# Patient Record
Sex: Male | Born: 1935 | Race: White | Hispanic: No | Marital: Married | Smoking: Never smoker
Health system: Southern US, Community
[De-identification: ages and names within clinical notes are randomized; demographics above are authoritative.]

## PROBLEM LIST (undated history)

## (undated) DIAGNOSIS — N135 Crossing vessel and stricture of ureter without hydronephrosis: Secondary | ICD-10-CM

## (undated) DIAGNOSIS — H3530 Unspecified macular degeneration: Secondary | ICD-10-CM

## (undated) DIAGNOSIS — I639 Cerebral infarction, unspecified: Secondary | ICD-10-CM

## (undated) DIAGNOSIS — J45909 Unspecified asthma, uncomplicated: Secondary | ICD-10-CM

## (undated) DIAGNOSIS — K635 Polyp of colon: Secondary | ICD-10-CM

## (undated) DIAGNOSIS — K219 Gastro-esophageal reflux disease without esophagitis: Secondary | ICD-10-CM

## (undated) DIAGNOSIS — T7840XA Allergy, unspecified, initial encounter: Secondary | ICD-10-CM

## (undated) DIAGNOSIS — R059 Cough, unspecified: Secondary | ICD-10-CM

## (undated) DIAGNOSIS — H9190 Unspecified hearing loss, unspecified ear: Secondary | ICD-10-CM

## (undated) DIAGNOSIS — N200 Calculus of kidney: Secondary | ICD-10-CM

## (undated) DIAGNOSIS — M1990 Unspecified osteoarthritis, unspecified site: Secondary | ICD-10-CM

## (undated) DIAGNOSIS — H409 Unspecified glaucoma: Secondary | ICD-10-CM

## (undated) DIAGNOSIS — H269 Unspecified cataract: Secondary | ICD-10-CM

## (undated) DIAGNOSIS — I10 Essential (primary) hypertension: Secondary | ICD-10-CM

## (undated) DIAGNOSIS — I509 Heart failure, unspecified: Secondary | ICD-10-CM

## (undated) DIAGNOSIS — E785 Hyperlipidemia, unspecified: Secondary | ICD-10-CM

## (undated) DIAGNOSIS — K5909 Other constipation: Secondary | ICD-10-CM

## (undated) DIAGNOSIS — R05 Cough: Secondary | ICD-10-CM

## (undated) DIAGNOSIS — I480 Paroxysmal atrial fibrillation: Secondary | ICD-10-CM

## (undated) HISTORY — DX: Heart failure, unspecified: I50.9

## (undated) HISTORY — DX: Crossing vessel and stricture of ureter without hydronephrosis: N13.5

## (undated) HISTORY — DX: Unspecified asthma, uncomplicated: J45.909

## (undated) HISTORY — DX: Cough, unspecified: R05.9

## (undated) HISTORY — DX: Essential (primary) hypertension: I10

## (undated) HISTORY — DX: Unspecified osteoarthritis, unspecified site: M19.90

## (undated) HISTORY — DX: Hyperlipidemia, unspecified: E78.5

## (undated) HISTORY — DX: Allergy, unspecified, initial encounter: T78.40XA

## (undated) HISTORY — DX: Cerebral infarction, unspecified: I63.9

## (undated) HISTORY — DX: Paroxysmal atrial fibrillation: I48.0

## (undated) HISTORY — DX: Unspecified cataract: H26.9

## (undated) HISTORY — DX: Gastro-esophageal reflux disease without esophagitis: K21.9

## (undated) HISTORY — DX: Cough: R05

## (undated) HISTORY — DX: Calculus of kidney: N20.0

## (undated) HISTORY — DX: Other constipation: K59.09

## (undated) HISTORY — DX: Unspecified macular degeneration: H35.30

## (undated) HISTORY — DX: Polyp of colon: K63.5

## (undated) HISTORY — DX: Unspecified glaucoma: H40.9

---

## 1993-01-21 HISTORY — PX: OTHER SURGICAL HISTORY: SHX169

## 1999-12-11 ENCOUNTER — Encounter: Payer: Self-pay | Admitting: Urology

## 1999-12-11 ENCOUNTER — Ambulatory Visit (HOSPITAL_COMMUNITY): Admission: RE | Admit: 1999-12-11 | Discharge: 1999-12-11 | Payer: Self-pay | Admitting: Urology

## 2000-01-07 ENCOUNTER — Ambulatory Visit (HOSPITAL_COMMUNITY): Admission: RE | Admit: 2000-01-07 | Discharge: 2000-01-07 | Payer: Self-pay | Admitting: Urology

## 2000-01-07 ENCOUNTER — Encounter: Payer: Self-pay | Admitting: Urology

## 2000-01-23 ENCOUNTER — Encounter: Payer: Self-pay | Admitting: Urology

## 2000-01-25 ENCOUNTER — Encounter: Payer: Self-pay | Admitting: Urology

## 2000-01-25 ENCOUNTER — Observation Stay (HOSPITAL_COMMUNITY): Admission: RE | Admit: 2000-01-25 | Discharge: 2000-01-25 | Payer: Self-pay | Admitting: Urology

## 2000-04-14 ENCOUNTER — Encounter: Payer: Self-pay | Admitting: Urology

## 2000-04-14 ENCOUNTER — Encounter: Admission: RE | Admit: 2000-04-14 | Discharge: 2000-04-14 | Payer: Self-pay | Admitting: Urology

## 2000-07-21 ENCOUNTER — Ambulatory Visit (HOSPITAL_COMMUNITY): Admission: RE | Admit: 2000-07-21 | Discharge: 2000-07-21 | Payer: Self-pay | Admitting: Urology

## 2000-07-21 ENCOUNTER — Encounter: Payer: Self-pay | Admitting: Urology

## 2001-03-23 ENCOUNTER — Encounter: Payer: Self-pay | Admitting: Urology

## 2001-03-23 ENCOUNTER — Ambulatory Visit (HOSPITAL_COMMUNITY): Admission: RE | Admit: 2001-03-23 | Discharge: 2001-03-23 | Payer: Self-pay | Admitting: Urology

## 2011-01-21 LAB — HM COLONOSCOPY

## 2011-01-28 DIAGNOSIS — Z86010 Personal history of colonic polyps: Secondary | ICD-10-CM | POA: Diagnosis not present

## 2011-01-28 DIAGNOSIS — E785 Hyperlipidemia, unspecified: Secondary | ICD-10-CM | POA: Diagnosis not present

## 2011-01-28 DIAGNOSIS — H4011X0 Primary open-angle glaucoma, stage unspecified: Secondary | ICD-10-CM | POA: Diagnosis not present

## 2011-01-28 DIAGNOSIS — N529 Male erectile dysfunction, unspecified: Secondary | ICD-10-CM | POA: Diagnosis not present

## 2011-01-28 DIAGNOSIS — N401 Enlarged prostate with lower urinary tract symptoms: Secondary | ICD-10-CM | POA: Diagnosis not present

## 2011-01-28 DIAGNOSIS — I10 Essential (primary) hypertension: Secondary | ICD-10-CM | POA: Diagnosis not present

## 2011-01-30 DIAGNOSIS — J3089 Other allergic rhinitis: Secondary | ICD-10-CM | POA: Diagnosis not present

## 2011-02-05 DIAGNOSIS — Z86010 Personal history of colonic polyps: Secondary | ICD-10-CM | POA: Diagnosis not present

## 2011-02-06 DIAGNOSIS — J3089 Other allergic rhinitis: Secondary | ICD-10-CM | POA: Diagnosis not present

## 2011-02-08 DIAGNOSIS — J3089 Other allergic rhinitis: Secondary | ICD-10-CM | POA: Diagnosis not present

## 2011-02-20 DIAGNOSIS — J3089 Other allergic rhinitis: Secondary | ICD-10-CM | POA: Diagnosis not present

## 2011-02-22 DIAGNOSIS — Z86010 Personal history of colonic polyps: Secondary | ICD-10-CM | POA: Diagnosis not present

## 2011-02-22 DIAGNOSIS — D126 Benign neoplasm of colon, unspecified: Secondary | ICD-10-CM | POA: Diagnosis not present

## 2011-02-22 DIAGNOSIS — Z1211 Encounter for screening for malignant neoplasm of colon: Secondary | ICD-10-CM | POA: Diagnosis not present

## 2011-02-22 DIAGNOSIS — D1779 Benign lipomatous neoplasm of other sites: Secondary | ICD-10-CM | POA: Diagnosis not present

## 2011-02-22 DIAGNOSIS — K5730 Diverticulosis of large intestine without perforation or abscess without bleeding: Secondary | ICD-10-CM | POA: Diagnosis not present

## 2011-02-27 DIAGNOSIS — J3089 Other allergic rhinitis: Secondary | ICD-10-CM | POA: Diagnosis not present

## 2011-03-06 DIAGNOSIS — J3089 Other allergic rhinitis: Secondary | ICD-10-CM | POA: Diagnosis not present

## 2011-03-13 DIAGNOSIS — J3089 Other allergic rhinitis: Secondary | ICD-10-CM | POA: Diagnosis not present

## 2011-03-27 DIAGNOSIS — J3089 Other allergic rhinitis: Secondary | ICD-10-CM | POA: Diagnosis not present

## 2011-04-03 DIAGNOSIS — J3089 Other allergic rhinitis: Secondary | ICD-10-CM | POA: Diagnosis not present

## 2011-04-10 DIAGNOSIS — J3089 Other allergic rhinitis: Secondary | ICD-10-CM | POA: Diagnosis not present

## 2011-04-17 DIAGNOSIS — J3089 Other allergic rhinitis: Secondary | ICD-10-CM | POA: Diagnosis not present

## 2011-05-01 DIAGNOSIS — J3089 Other allergic rhinitis: Secondary | ICD-10-CM | POA: Diagnosis not present

## 2011-05-15 DIAGNOSIS — J3089 Other allergic rhinitis: Secondary | ICD-10-CM | POA: Diagnosis not present

## 2011-06-05 DIAGNOSIS — J3089 Other allergic rhinitis: Secondary | ICD-10-CM | POA: Diagnosis not present

## 2011-06-13 DIAGNOSIS — H4011X0 Primary open-angle glaucoma, stage unspecified: Secondary | ICD-10-CM | POA: Diagnosis not present

## 2011-06-13 DIAGNOSIS — L570 Actinic keratosis: Secondary | ICD-10-CM | POA: Diagnosis not present

## 2011-06-13 DIAGNOSIS — Z961 Presence of intraocular lens: Secondary | ICD-10-CM | POA: Diagnosis not present

## 2011-06-13 DIAGNOSIS — L821 Other seborrheic keratosis: Secondary | ICD-10-CM | POA: Diagnosis not present

## 2011-06-13 DIAGNOSIS — L820 Inflamed seborrheic keratosis: Secondary | ICD-10-CM | POA: Diagnosis not present

## 2011-06-13 DIAGNOSIS — H25099 Other age-related incipient cataract, unspecified eye: Secondary | ICD-10-CM | POA: Diagnosis not present

## 2011-06-26 DIAGNOSIS — J3089 Other allergic rhinitis: Secondary | ICD-10-CM | POA: Diagnosis not present

## 2011-07-03 DIAGNOSIS — M1710 Unilateral primary osteoarthritis, unspecified knee: Secondary | ICD-10-CM | POA: Diagnosis not present

## 2011-07-03 DIAGNOSIS — M25569 Pain in unspecified knee: Secondary | ICD-10-CM | POA: Diagnosis not present

## 2011-07-03 DIAGNOSIS — M25469 Effusion, unspecified knee: Secondary | ICD-10-CM | POA: Diagnosis not present

## 2011-07-04 DIAGNOSIS — J3089 Other allergic rhinitis: Secondary | ICD-10-CM | POA: Diagnosis not present

## 2011-07-17 DIAGNOSIS — J3089 Other allergic rhinitis: Secondary | ICD-10-CM | POA: Diagnosis not present

## 2011-07-29 DIAGNOSIS — E785 Hyperlipidemia, unspecified: Secondary | ICD-10-CM | POA: Diagnosis not present

## 2011-07-29 DIAGNOSIS — N401 Enlarged prostate with lower urinary tract symptoms: Secondary | ICD-10-CM | POA: Diagnosis not present

## 2011-07-29 DIAGNOSIS — I10 Essential (primary) hypertension: Secondary | ICD-10-CM | POA: Diagnosis not present

## 2011-07-31 DIAGNOSIS — J3089 Other allergic rhinitis: Secondary | ICD-10-CM | POA: Diagnosis not present

## 2011-08-01 DIAGNOSIS — J3089 Other allergic rhinitis: Secondary | ICD-10-CM | POA: Diagnosis not present

## 2011-08-08 DIAGNOSIS — M1710 Unilateral primary osteoarthritis, unspecified knee: Secondary | ICD-10-CM | POA: Diagnosis not present

## 2011-08-08 DIAGNOSIS — M25569 Pain in unspecified knee: Secondary | ICD-10-CM | POA: Diagnosis not present

## 2011-08-14 DIAGNOSIS — J301 Allergic rhinitis due to pollen: Secondary | ICD-10-CM | POA: Diagnosis not present

## 2011-08-14 DIAGNOSIS — M25569 Pain in unspecified knee: Secondary | ICD-10-CM | POA: Diagnosis not present

## 2011-08-14 DIAGNOSIS — J3089 Other allergic rhinitis: Secondary | ICD-10-CM | POA: Diagnosis not present

## 2011-08-14 DIAGNOSIS — M1710 Unilateral primary osteoarthritis, unspecified knee: Secondary | ICD-10-CM | POA: Diagnosis not present

## 2011-08-21 DIAGNOSIS — M25569 Pain in unspecified knee: Secondary | ICD-10-CM | POA: Diagnosis not present

## 2011-08-21 DIAGNOSIS — M1710 Unilateral primary osteoarthritis, unspecified knee: Secondary | ICD-10-CM | POA: Diagnosis not present

## 2011-09-11 DIAGNOSIS — H4011X0 Primary open-angle glaucoma, stage unspecified: Secondary | ICD-10-CM | POA: Diagnosis not present

## 2011-09-11 DIAGNOSIS — J3089 Other allergic rhinitis: Secondary | ICD-10-CM | POA: Diagnosis not present

## 2011-09-11 DIAGNOSIS — H43399 Other vitreous opacities, unspecified eye: Secondary | ICD-10-CM | POA: Diagnosis not present

## 2011-09-11 DIAGNOSIS — H25099 Other age-related incipient cataract, unspecified eye: Secondary | ICD-10-CM | POA: Diagnosis not present

## 2011-09-11 DIAGNOSIS — Z961 Presence of intraocular lens: Secondary | ICD-10-CM | POA: Diagnosis not present

## 2011-10-02 DIAGNOSIS — J3089 Other allergic rhinitis: Secondary | ICD-10-CM | POA: Diagnosis not present

## 2011-10-09 DIAGNOSIS — M25569 Pain in unspecified knee: Secondary | ICD-10-CM | POA: Diagnosis not present

## 2011-10-09 DIAGNOSIS — M1710 Unilateral primary osteoarthritis, unspecified knee: Secondary | ICD-10-CM | POA: Diagnosis not present

## 2011-10-18 DIAGNOSIS — M79609 Pain in unspecified limb: Secondary | ICD-10-CM | POA: Diagnosis not present

## 2011-10-18 DIAGNOSIS — M25569 Pain in unspecified knee: Secondary | ICD-10-CM | POA: Diagnosis not present

## 2011-10-18 DIAGNOSIS — IMO0002 Reserved for concepts with insufficient information to code with codable children: Secondary | ICD-10-CM | POA: Diagnosis not present

## 2011-10-18 DIAGNOSIS — M1710 Unilateral primary osteoarthritis, unspecified knee: Secondary | ICD-10-CM | POA: Diagnosis not present

## 2011-10-25 DIAGNOSIS — M1710 Unilateral primary osteoarthritis, unspecified knee: Secondary | ICD-10-CM | POA: Diagnosis not present

## 2011-10-25 DIAGNOSIS — IMO0002 Reserved for concepts with insufficient information to code with codable children: Secondary | ICD-10-CM | POA: Diagnosis not present

## 2011-10-25 DIAGNOSIS — M79609 Pain in unspecified limb: Secondary | ICD-10-CM | POA: Diagnosis not present

## 2011-10-25 DIAGNOSIS — M25569 Pain in unspecified knee: Secondary | ICD-10-CM | POA: Diagnosis not present

## 2011-10-27 DIAGNOSIS — Z23 Encounter for immunization: Secondary | ICD-10-CM | POA: Diagnosis not present

## 2011-10-30 DIAGNOSIS — J3089 Other allergic rhinitis: Secondary | ICD-10-CM | POA: Diagnosis not present

## 2011-10-30 DIAGNOSIS — M1710 Unilateral primary osteoarthritis, unspecified knee: Secondary | ICD-10-CM | POA: Diagnosis not present

## 2011-10-30 DIAGNOSIS — IMO0002 Reserved for concepts with insufficient information to code with codable children: Secondary | ICD-10-CM | POA: Diagnosis not present

## 2011-10-30 DIAGNOSIS — M25569 Pain in unspecified knee: Secondary | ICD-10-CM | POA: Diagnosis not present

## 2011-10-30 DIAGNOSIS — M79609 Pain in unspecified limb: Secondary | ICD-10-CM | POA: Diagnosis not present

## 2011-11-13 DIAGNOSIS — M25569 Pain in unspecified knee: Secondary | ICD-10-CM | POA: Diagnosis not present

## 2011-11-13 DIAGNOSIS — M1710 Unilateral primary osteoarthritis, unspecified knee: Secondary | ICD-10-CM | POA: Diagnosis not present

## 2011-11-13 DIAGNOSIS — IMO0002 Reserved for concepts with insufficient information to code with codable children: Secondary | ICD-10-CM | POA: Diagnosis not present

## 2011-11-13 DIAGNOSIS — M79609 Pain in unspecified limb: Secondary | ICD-10-CM | POA: Diagnosis not present

## 2011-11-27 DIAGNOSIS — J301 Allergic rhinitis due to pollen: Secondary | ICD-10-CM | POA: Diagnosis not present

## 2011-11-27 DIAGNOSIS — M1710 Unilateral primary osteoarthritis, unspecified knee: Secondary | ICD-10-CM | POA: Diagnosis not present

## 2011-11-27 DIAGNOSIS — M25569 Pain in unspecified knee: Secondary | ICD-10-CM | POA: Diagnosis not present

## 2011-11-27 DIAGNOSIS — J3089 Other allergic rhinitis: Secondary | ICD-10-CM | POA: Diagnosis not present

## 2011-12-12 DIAGNOSIS — Z0000 Encounter for general adult medical examination without abnormal findings: Secondary | ICD-10-CM | POA: Diagnosis not present

## 2011-12-23 DIAGNOSIS — R5381 Other malaise: Secondary | ICD-10-CM | POA: Diagnosis not present

## 2011-12-23 DIAGNOSIS — R5383 Other fatigue: Secondary | ICD-10-CM | POA: Diagnosis not present

## 2011-12-23 DIAGNOSIS — I10 Essential (primary) hypertension: Secondary | ICD-10-CM | POA: Diagnosis not present

## 2011-12-23 DIAGNOSIS — Z79899 Other long term (current) drug therapy: Secondary | ICD-10-CM | POA: Diagnosis not present

## 2011-12-23 DIAGNOSIS — E785 Hyperlipidemia, unspecified: Secondary | ICD-10-CM | POA: Diagnosis not present

## 2011-12-24 LAB — LIPID PANEL
HDL: 48 mg/dL (ref 35–70)
LDL Cholesterol: 101 mg/dL
Triglycerides: 124 mg/dL (ref 40–160)

## 2011-12-24 LAB — CBC AND DIFFERENTIAL
HCT: 36 % — AB (ref 41–53)
Hemoglobin: 12.4 g/dL — AB (ref 13.5–17.5)
Platelets: 191 10*3/uL (ref 150–399)
WBC: 4.2 10^3/mL

## 2011-12-24 LAB — BASIC METABOLIC PANEL
BUN: 18 mg/dL (ref 4–21)
Creatinine: 1.1 mg/dL (ref 0.6–1.3)
Potassium: 4.1 mmol/L (ref 3.4–5.3)

## 2011-12-24 LAB — TSH: TSH: 1.24 u[IU]/mL (ref 0.41–5.90)

## 2011-12-25 DIAGNOSIS — J3089 Other allergic rhinitis: Secondary | ICD-10-CM | POA: Diagnosis not present

## 2012-01-07 DIAGNOSIS — H903 Sensorineural hearing loss, bilateral: Secondary | ICD-10-CM | POA: Diagnosis not present

## 2012-01-07 DIAGNOSIS — N529 Male erectile dysfunction, unspecified: Secondary | ICD-10-CM | POA: Diagnosis not present

## 2012-01-07 DIAGNOSIS — H4011X0 Primary open-angle glaucoma, stage unspecified: Secondary | ICD-10-CM | POA: Diagnosis not present

## 2012-01-07 DIAGNOSIS — Z86010 Personal history of colonic polyps: Secondary | ICD-10-CM | POA: Diagnosis not present

## 2012-01-07 DIAGNOSIS — N401 Enlarged prostate with lower urinary tract symptoms: Secondary | ICD-10-CM | POA: Diagnosis not present

## 2012-01-07 DIAGNOSIS — I10 Essential (primary) hypertension: Secondary | ICD-10-CM | POA: Diagnosis not present

## 2012-01-08 DIAGNOSIS — J3089 Other allergic rhinitis: Secondary | ICD-10-CM | POA: Diagnosis not present

## 2012-01-22 HISTORY — PX: JOINT REPLACEMENT: SHX530

## 2012-01-29 DIAGNOSIS — J3089 Other allergic rhinitis: Secondary | ICD-10-CM | POA: Diagnosis not present

## 2012-02-05 DIAGNOSIS — IMO0002 Reserved for concepts with insufficient information to code with codable children: Secondary | ICD-10-CM | POA: Diagnosis not present

## 2012-02-05 DIAGNOSIS — R079 Chest pain, unspecified: Secondary | ICD-10-CM | POA: Diagnosis not present

## 2012-02-05 DIAGNOSIS — M129 Arthropathy, unspecified: Secondary | ICD-10-CM | POA: Diagnosis not present

## 2012-02-05 DIAGNOSIS — J45909 Unspecified asthma, uncomplicated: Secondary | ICD-10-CM | POA: Diagnosis not present

## 2012-02-05 DIAGNOSIS — H409 Unspecified glaucoma: Secondary | ICD-10-CM | POA: Diagnosis not present

## 2012-02-05 DIAGNOSIS — M25469 Effusion, unspecified knee: Secondary | ICD-10-CM | POA: Diagnosis not present

## 2012-02-05 DIAGNOSIS — M1710 Unilateral primary osteoarthritis, unspecified knee: Secondary | ICD-10-CM | POA: Diagnosis not present

## 2012-02-05 DIAGNOSIS — I10 Essential (primary) hypertension: Secondary | ICD-10-CM | POA: Diagnosis not present

## 2012-02-05 DIAGNOSIS — N529 Male erectile dysfunction, unspecified: Secondary | ICD-10-CM | POA: Diagnosis not present

## 2012-02-10 DIAGNOSIS — M25569 Pain in unspecified knee: Secondary | ICD-10-CM | POA: Diagnosis not present

## 2012-02-10 DIAGNOSIS — M1710 Unilateral primary osteoarthritis, unspecified knee: Secondary | ICD-10-CM | POA: Diagnosis not present

## 2012-02-12 DIAGNOSIS — H269 Unspecified cataract: Secondary | ICD-10-CM | POA: Diagnosis not present

## 2012-02-12 DIAGNOSIS — H4010X0 Unspecified open-angle glaucoma, stage unspecified: Secondary | ICD-10-CM | POA: Diagnosis not present

## 2012-02-12 DIAGNOSIS — Z961 Presence of intraocular lens: Secondary | ICD-10-CM | POA: Diagnosis not present

## 2012-02-17 DIAGNOSIS — R0989 Other specified symptoms and signs involving the circulatory and respiratory systems: Secondary | ICD-10-CM | POA: Diagnosis not present

## 2012-02-21 DIAGNOSIS — N401 Enlarged prostate with lower urinary tract symptoms: Secondary | ICD-10-CM | POA: Diagnosis not present

## 2012-02-21 DIAGNOSIS — I10 Essential (primary) hypertension: Secondary | ICD-10-CM | POA: Diagnosis not present

## 2012-02-21 DIAGNOSIS — Z01818 Encounter for other preprocedural examination: Secondary | ICD-10-CM | POA: Diagnosis not present

## 2012-02-26 DIAGNOSIS — J3089 Other allergic rhinitis: Secondary | ICD-10-CM | POA: Diagnosis not present

## 2012-02-28 DIAGNOSIS — M79609 Pain in unspecified limb: Secondary | ICD-10-CM | POA: Diagnosis not present

## 2012-02-28 DIAGNOSIS — M1710 Unilateral primary osteoarthritis, unspecified knee: Secondary | ICD-10-CM | POA: Diagnosis not present

## 2012-02-28 DIAGNOSIS — M25569 Pain in unspecified knee: Secondary | ICD-10-CM | POA: Diagnosis not present

## 2012-02-28 DIAGNOSIS — IMO0002 Reserved for concepts with insufficient information to code with codable children: Secondary | ICD-10-CM | POA: Diagnosis not present

## 2012-03-02 DIAGNOSIS — M129 Arthropathy, unspecified: Secondary | ICD-10-CM | POA: Diagnosis not present

## 2012-03-02 DIAGNOSIS — H409 Unspecified glaucoma: Secondary | ICD-10-CM | POA: Diagnosis not present

## 2012-03-02 DIAGNOSIS — N529 Male erectile dysfunction, unspecified: Secondary | ICD-10-CM | POA: Diagnosis not present

## 2012-03-02 DIAGNOSIS — M1710 Unilateral primary osteoarthritis, unspecified knee: Secondary | ICD-10-CM | POA: Diagnosis not present

## 2012-03-02 DIAGNOSIS — I10 Essential (primary) hypertension: Secondary | ICD-10-CM | POA: Diagnosis not present

## 2012-03-02 DIAGNOSIS — J45909 Unspecified asthma, uncomplicated: Secondary | ICD-10-CM | POA: Diagnosis not present

## 2012-03-03 DIAGNOSIS — M1710 Unilateral primary osteoarthritis, unspecified knee: Secondary | ICD-10-CM | POA: Diagnosis not present

## 2012-03-03 DIAGNOSIS — N529 Male erectile dysfunction, unspecified: Secondary | ICD-10-CM | POA: Diagnosis not present

## 2012-03-03 DIAGNOSIS — H409 Unspecified glaucoma: Secondary | ICD-10-CM | POA: Diagnosis not present

## 2012-03-03 DIAGNOSIS — M129 Arthropathy, unspecified: Secondary | ICD-10-CM | POA: Diagnosis not present

## 2012-03-03 DIAGNOSIS — J45909 Unspecified asthma, uncomplicated: Secondary | ICD-10-CM | POA: Diagnosis not present

## 2012-03-03 DIAGNOSIS — I10 Essential (primary) hypertension: Secondary | ICD-10-CM | POA: Diagnosis not present

## 2012-03-03 DIAGNOSIS — M25569 Pain in unspecified knee: Secondary | ICD-10-CM | POA: Diagnosis not present

## 2012-03-03 DIAGNOSIS — IMO0002 Reserved for concepts with insufficient information to code with codable children: Secondary | ICD-10-CM | POA: Diagnosis not present

## 2012-03-03 DIAGNOSIS — Z471 Aftercare following joint replacement surgery: Secondary | ICD-10-CM | POA: Diagnosis not present

## 2012-03-03 DIAGNOSIS — Z96659 Presence of unspecified artificial knee joint: Secondary | ICD-10-CM | POA: Diagnosis not present

## 2012-03-04 DIAGNOSIS — I10 Essential (primary) hypertension: Secondary | ICD-10-CM | POA: Diagnosis not present

## 2012-03-04 DIAGNOSIS — M129 Arthropathy, unspecified: Secondary | ICD-10-CM | POA: Diagnosis not present

## 2012-03-04 DIAGNOSIS — H409 Unspecified glaucoma: Secondary | ICD-10-CM | POA: Diagnosis not present

## 2012-03-04 DIAGNOSIS — M1710 Unilateral primary osteoarthritis, unspecified knee: Secondary | ICD-10-CM | POA: Diagnosis not present

## 2012-03-04 DIAGNOSIS — N529 Male erectile dysfunction, unspecified: Secondary | ICD-10-CM | POA: Diagnosis not present

## 2012-03-04 DIAGNOSIS — J45909 Unspecified asthma, uncomplicated: Secondary | ICD-10-CM | POA: Diagnosis not present

## 2012-03-05 DIAGNOSIS — M79609 Pain in unspecified limb: Secondary | ICD-10-CM | POA: Diagnosis not present

## 2012-03-05 DIAGNOSIS — IMO0002 Reserved for concepts with insufficient information to code with codable children: Secondary | ICD-10-CM | POA: Diagnosis not present

## 2012-03-05 DIAGNOSIS — M1710 Unilateral primary osteoarthritis, unspecified knee: Secondary | ICD-10-CM | POA: Diagnosis not present

## 2012-03-05 DIAGNOSIS — J3089 Other allergic rhinitis: Secondary | ICD-10-CM | POA: Diagnosis not present

## 2012-03-05 DIAGNOSIS — M25569 Pain in unspecified knee: Secondary | ICD-10-CM | POA: Diagnosis not present

## 2012-03-09 DIAGNOSIS — M25569 Pain in unspecified knee: Secondary | ICD-10-CM | POA: Diagnosis not present

## 2012-03-09 DIAGNOSIS — M1710 Unilateral primary osteoarthritis, unspecified knee: Secondary | ICD-10-CM | POA: Diagnosis not present

## 2012-03-09 DIAGNOSIS — IMO0002 Reserved for concepts with insufficient information to code with codable children: Secondary | ICD-10-CM | POA: Diagnosis not present

## 2012-03-09 DIAGNOSIS — M79609 Pain in unspecified limb: Secondary | ICD-10-CM | POA: Diagnosis not present

## 2012-03-12 DIAGNOSIS — M79609 Pain in unspecified limb: Secondary | ICD-10-CM | POA: Diagnosis not present

## 2012-03-12 DIAGNOSIS — IMO0002 Reserved for concepts with insufficient information to code with codable children: Secondary | ICD-10-CM | POA: Diagnosis not present

## 2012-03-12 DIAGNOSIS — M25569 Pain in unspecified knee: Secondary | ICD-10-CM | POA: Diagnosis not present

## 2012-03-12 DIAGNOSIS — M1710 Unilateral primary osteoarthritis, unspecified knee: Secondary | ICD-10-CM | POA: Diagnosis not present

## 2012-03-16 DIAGNOSIS — IMO0002 Reserved for concepts with insufficient information to code with codable children: Secondary | ICD-10-CM | POA: Diagnosis not present

## 2012-03-16 DIAGNOSIS — M25569 Pain in unspecified knee: Secondary | ICD-10-CM | POA: Diagnosis not present

## 2012-03-16 DIAGNOSIS — M79609 Pain in unspecified limb: Secondary | ICD-10-CM | POA: Diagnosis not present

## 2012-03-16 DIAGNOSIS — M1710 Unilateral primary osteoarthritis, unspecified knee: Secondary | ICD-10-CM | POA: Diagnosis not present

## 2012-03-18 DIAGNOSIS — M25569 Pain in unspecified knee: Secondary | ICD-10-CM | POA: Diagnosis not present

## 2012-03-18 DIAGNOSIS — M79609 Pain in unspecified limb: Secondary | ICD-10-CM | POA: Diagnosis not present

## 2012-03-18 DIAGNOSIS — J3089 Other allergic rhinitis: Secondary | ICD-10-CM | POA: Diagnosis not present

## 2012-03-18 DIAGNOSIS — IMO0002 Reserved for concepts with insufficient information to code with codable children: Secondary | ICD-10-CM | POA: Diagnosis not present

## 2012-03-18 DIAGNOSIS — M1710 Unilateral primary osteoarthritis, unspecified knee: Secondary | ICD-10-CM | POA: Diagnosis not present

## 2012-03-24 DIAGNOSIS — M1710 Unilateral primary osteoarthritis, unspecified knee: Secondary | ICD-10-CM | POA: Diagnosis not present

## 2012-03-24 DIAGNOSIS — M25569 Pain in unspecified knee: Secondary | ICD-10-CM | POA: Diagnosis not present

## 2012-03-24 DIAGNOSIS — IMO0002 Reserved for concepts with insufficient information to code with codable children: Secondary | ICD-10-CM | POA: Diagnosis not present

## 2012-03-24 DIAGNOSIS — M79609 Pain in unspecified limb: Secondary | ICD-10-CM | POA: Diagnosis not present

## 2012-03-25 DIAGNOSIS — J3089 Other allergic rhinitis: Secondary | ICD-10-CM | POA: Diagnosis not present

## 2012-03-26 DIAGNOSIS — M1710 Unilateral primary osteoarthritis, unspecified knee: Secondary | ICD-10-CM | POA: Diagnosis not present

## 2012-03-26 DIAGNOSIS — M25569 Pain in unspecified knee: Secondary | ICD-10-CM | POA: Diagnosis not present

## 2012-03-26 DIAGNOSIS — M79609 Pain in unspecified limb: Secondary | ICD-10-CM | POA: Diagnosis not present

## 2012-03-26 DIAGNOSIS — IMO0002 Reserved for concepts with insufficient information to code with codable children: Secondary | ICD-10-CM | POA: Diagnosis not present

## 2012-03-30 DIAGNOSIS — IMO0002 Reserved for concepts with insufficient information to code with codable children: Secondary | ICD-10-CM | POA: Diagnosis not present

## 2012-03-30 DIAGNOSIS — M79609 Pain in unspecified limb: Secondary | ICD-10-CM | POA: Diagnosis not present

## 2012-03-30 DIAGNOSIS — M1710 Unilateral primary osteoarthritis, unspecified knee: Secondary | ICD-10-CM | POA: Diagnosis not present

## 2012-03-30 DIAGNOSIS — M25569 Pain in unspecified knee: Secondary | ICD-10-CM | POA: Diagnosis not present

## 2012-04-03 DIAGNOSIS — IMO0002 Reserved for concepts with insufficient information to code with codable children: Secondary | ICD-10-CM | POA: Diagnosis not present

## 2012-04-03 DIAGNOSIS — M79609 Pain in unspecified limb: Secondary | ICD-10-CM | POA: Diagnosis not present

## 2012-04-03 DIAGNOSIS — M25569 Pain in unspecified knee: Secondary | ICD-10-CM | POA: Diagnosis not present

## 2012-04-07 DIAGNOSIS — M1710 Unilateral primary osteoarthritis, unspecified knee: Secondary | ICD-10-CM | POA: Diagnosis not present

## 2012-04-07 DIAGNOSIS — M25569 Pain in unspecified knee: Secondary | ICD-10-CM | POA: Diagnosis not present

## 2012-04-07 DIAGNOSIS — IMO0002 Reserved for concepts with insufficient information to code with codable children: Secondary | ICD-10-CM | POA: Diagnosis not present

## 2012-04-07 DIAGNOSIS — M79609 Pain in unspecified limb: Secondary | ICD-10-CM | POA: Diagnosis not present

## 2012-04-08 DIAGNOSIS — J301 Allergic rhinitis due to pollen: Secondary | ICD-10-CM | POA: Diagnosis not present

## 2012-04-08 DIAGNOSIS — J3089 Other allergic rhinitis: Secondary | ICD-10-CM | POA: Diagnosis not present

## 2012-04-15 DIAGNOSIS — M25569 Pain in unspecified knee: Secondary | ICD-10-CM | POA: Diagnosis not present

## 2012-04-15 DIAGNOSIS — IMO0002 Reserved for concepts with insufficient information to code with codable children: Secondary | ICD-10-CM | POA: Diagnosis not present

## 2012-04-15 DIAGNOSIS — M79609 Pain in unspecified limb: Secondary | ICD-10-CM | POA: Diagnosis not present

## 2012-04-15 DIAGNOSIS — M1710 Unilateral primary osteoarthritis, unspecified knee: Secondary | ICD-10-CM | POA: Diagnosis not present

## 2012-04-16 DIAGNOSIS — M1710 Unilateral primary osteoarthritis, unspecified knee: Secondary | ICD-10-CM | POA: Diagnosis not present

## 2012-04-22 DIAGNOSIS — J3089 Other allergic rhinitis: Secondary | ICD-10-CM | POA: Diagnosis not present

## 2012-05-20 DIAGNOSIS — J3089 Other allergic rhinitis: Secondary | ICD-10-CM | POA: Diagnosis not present

## 2012-06-10 DIAGNOSIS — J3089 Other allergic rhinitis: Secondary | ICD-10-CM | POA: Diagnosis not present

## 2012-07-01 DIAGNOSIS — J3089 Other allergic rhinitis: Secondary | ICD-10-CM | POA: Diagnosis not present

## 2012-07-01 DIAGNOSIS — M1710 Unilateral primary osteoarthritis, unspecified knee: Secondary | ICD-10-CM | POA: Diagnosis not present

## 2012-07-15 DIAGNOSIS — H269 Unspecified cataract: Secondary | ICD-10-CM | POA: Diagnosis not present

## 2012-07-15 DIAGNOSIS — Z961 Presence of intraocular lens: Secondary | ICD-10-CM | POA: Diagnosis not present

## 2012-07-15 DIAGNOSIS — J3089 Other allergic rhinitis: Secondary | ICD-10-CM | POA: Diagnosis not present

## 2012-07-15 DIAGNOSIS — H4010X0 Unspecified open-angle glaucoma, stage unspecified: Secondary | ICD-10-CM | POA: Diagnosis not present

## 2012-08-05 DIAGNOSIS — J309 Allergic rhinitis, unspecified: Secondary | ICD-10-CM | POA: Diagnosis not present

## 2012-08-11 DIAGNOSIS — J309 Allergic rhinitis, unspecified: Secondary | ICD-10-CM | POA: Diagnosis not present

## 2012-08-21 DIAGNOSIS — J309 Allergic rhinitis, unspecified: Secondary | ICD-10-CM | POA: Diagnosis not present

## 2012-08-25 DIAGNOSIS — J309 Allergic rhinitis, unspecified: Secondary | ICD-10-CM | POA: Diagnosis not present

## 2012-08-28 DIAGNOSIS — J309 Allergic rhinitis, unspecified: Secondary | ICD-10-CM | POA: Diagnosis not present

## 2012-09-01 DIAGNOSIS — J309 Allergic rhinitis, unspecified: Secondary | ICD-10-CM | POA: Diagnosis not present

## 2012-09-08 DIAGNOSIS — J309 Allergic rhinitis, unspecified: Secondary | ICD-10-CM | POA: Diagnosis not present

## 2012-09-11 DIAGNOSIS — J309 Allergic rhinitis, unspecified: Secondary | ICD-10-CM | POA: Diagnosis not present

## 2012-09-15 DIAGNOSIS — J309 Allergic rhinitis, unspecified: Secondary | ICD-10-CM | POA: Diagnosis not present

## 2012-09-22 DIAGNOSIS — J309 Allergic rhinitis, unspecified: Secondary | ICD-10-CM | POA: Diagnosis not present

## 2012-09-29 DIAGNOSIS — J309 Allergic rhinitis, unspecified: Secondary | ICD-10-CM | POA: Diagnosis not present

## 2012-10-02 DIAGNOSIS — J309 Allergic rhinitis, unspecified: Secondary | ICD-10-CM | POA: Diagnosis not present

## 2012-10-06 DIAGNOSIS — J309 Allergic rhinitis, unspecified: Secondary | ICD-10-CM | POA: Diagnosis not present

## 2012-10-09 DIAGNOSIS — J309 Allergic rhinitis, unspecified: Secondary | ICD-10-CM | POA: Diagnosis not present

## 2012-10-13 DIAGNOSIS — J309 Allergic rhinitis, unspecified: Secondary | ICD-10-CM | POA: Diagnosis not present

## 2012-10-16 DIAGNOSIS — J309 Allergic rhinitis, unspecified: Secondary | ICD-10-CM | POA: Diagnosis not present

## 2012-10-20 DIAGNOSIS — J309 Allergic rhinitis, unspecified: Secondary | ICD-10-CM | POA: Diagnosis not present

## 2012-10-21 ENCOUNTER — Ambulatory Visit (INDEPENDENT_AMBULATORY_CARE_PROVIDER_SITE_OTHER): Payer: Medicare Other | Admitting: Internal Medicine

## 2012-10-21 ENCOUNTER — Encounter: Payer: Self-pay | Admitting: Internal Medicine

## 2012-10-21 VITALS — BP 112/62 | HR 63 | Temp 98.2°F | Resp 14 | Ht 69.75 in | Wt <= 1120 oz

## 2012-10-21 DIAGNOSIS — Z125 Encounter for screening for malignant neoplasm of prostate: Secondary | ICD-10-CM

## 2012-10-21 DIAGNOSIS — Z79899 Other long term (current) drug therapy: Secondary | ICD-10-CM | POA: Diagnosis not present

## 2012-10-21 DIAGNOSIS — R5381 Other malaise: Secondary | ICD-10-CM

## 2012-10-21 DIAGNOSIS — N400 Enlarged prostate without lower urinary tract symptoms: Secondary | ICD-10-CM | POA: Diagnosis not present

## 2012-10-21 DIAGNOSIS — Z23 Encounter for immunization: Secondary | ICD-10-CM

## 2012-10-21 DIAGNOSIS — I10 Essential (primary) hypertension: Secondary | ICD-10-CM | POA: Diagnosis not present

## 2012-10-21 DIAGNOSIS — E559 Vitamin D deficiency, unspecified: Secondary | ICD-10-CM | POA: Diagnosis not present

## 2012-10-21 DIAGNOSIS — Z86010 Personal history of colonic polyps: Secondary | ICD-10-CM

## 2012-10-21 DIAGNOSIS — E785 Hyperlipidemia, unspecified: Secondary | ICD-10-CM

## 2012-10-21 DIAGNOSIS — H409 Unspecified glaucoma: Secondary | ICD-10-CM

## 2012-10-21 DIAGNOSIS — N135 Crossing vessel and stricture of ureter without hydronephrosis: Secondary | ICD-10-CM | POA: Insufficient documentation

## 2012-10-21 MED ORDER — TETANUS-DIPHTH-ACELL PERTUSSIS 5-2.5-18.5 LF-MCG/0.5 IM SUSP: 0.5000 mL | Freq: Once | INTRAMUSCULAR | Status: DC

## 2012-10-21 MED ORDER — ZOSTER VACCINE LIVE 19400 UNT/0.65ML ~~LOC~~ SOLR: 0.6500 mL | Freq: Once | SUBCUTANEOUS | Status: DC

## 2012-10-21 NOTE — Progress Notes (Signed)
Patient ID: Todd Golden, male   DOB: 1930-06-21, 79 y.o.   MRN: [id number]  Patient Active Problem List   Diagnosis Date Noted  . Essential hypertension, benign 10/21/2012  . Glaucoma 10/21/2012    Subjective:  CC:   Chief Complaint  Patient presents with  . Establish Care    HPI:   Todd Golden is a 79 y.o. male who presents as a new patient to establish primary care with the chief complaint of  Blurred vision by gthe end of the day.  Gets eye exam every 4 months for management of glaucoma. .   Left Foot pain is chronic , uses insert provided by podiatrist. Arch support mostly.  Foot doesn't hurt during the day bc he wears good shoes with the arch support Hurts only at night from 2 am to 4 am  .  Denies burning quality   Improves with stretching .  Started after his knee replacement.   Gets foot cramps at night often after working in the garden and sweating.   Had a partial knee replacement,  Left knee while in [place] .,  Robotic ,. With good success ,  Done feb 2014.   Right kidney hydronephrosis,  Previously managed by [facility] in [place] ureteral obstruction s/p stent placement x 30 days ,  Then removed.  Kidney is still functional but periodically9every month or two)  he develops right flank pain, usually after a big meal ,  So he drinks cranberry juice and the spends 30 minutes in his recliner and the pain resolves. He thinks the ureter is kinking as the cause. Has not been proven  HTN  Takes lisinopril for years.,    Environmental allergies.  Managed by [facility]   Colonic polyp ,  Very large one on the right side,  found last year during colonoscopy , was too large to resect but was apparently biopsied, and told to leave it alone unless  it bothered him    Chronic intermittent constipation manged with daily  Bulk forming laxative metamucil and prn dulcolax weekly    Past Medical History  Diagnosis Date  . Asthma   . Arthritis   . Glaucoma   . Allergy   .  Hypertension   . Colon polyps   . Chronic kidney disease     stent    Past Surgical History  Procedure Laterality Date  . Joint replacement Left 2014    knee partial replacement  . Kidney stent Right 1995    Family History  Problem Relation Age of Onset  . Arthritis Mother   . Cancer Mother 41    kidney  . Stroke Father     History   Social History  . Marital Status: Married    Spouse Name: N/A    Number of Children: N/A  . Years of Education: N/A   Occupational History  . Not on file.   Social History Main Topics  . Smoking status: Never Smoker   . Smokeless tobacco: Never Used  . Alcohol Use: Yes  . Drug Use: No  . Sexual Activity: Not Currently   Other Topics Concern  . Not on file   Social History Narrative  . No narrative on file   No Known Allergies    Review of Systems:   Patient denies headache, fevers, malaise, unintentional weight loss, skin rash, eye pain, sinus congestion and sinus pain, sore throat, dysphagia,  hemoptysis , cough, dyspnea, wheezing, chest pain, palpitations, orthopnea, edema,  abdominal pain, nausea, melena, diarrhea, constipation, flank pain, dysuria, hematuria, urinary  Frequency, nocturia, numbness, tingling, seizures,  Focal weakness, Loss of consciousness,  Tremor, insomnia, depression, anxiety, and suicidal ideation.     Objective:  BP 112/62  Pulse 63  Temp(Src) 98.2 F (36.8 C) (Oral)  Resp 14  Ht 5' 9.75" (1.772 m)  Wt 25 lb 8 oz (11.567 kg)  BMI 3.68 kg/m2  SpO2 98%  General appearance: alert, cooperative and appears stated age Ears: normal TM's and external ear canals both ears Throat: lips, mucosa, and tongue normal; teeth and gums normal Neck: no adenopathy, no carotid bruit, supple, symmetrical, trachea midline and thyroid not enlarged, symmetric, no tenderness/mass/nodules Back: symmetric, no curvature. ROM normal. No CVA tenderness. Lungs: clear to auscultation bilaterally Heart: regular rate and  rhythm, S1, S2 normal, no murmur, click, rub or gallop Abdomen: soft, non-tender; bowel sounds normal; no masses,  no organomegaly Pulses: 2+ and symmetric Skin: Skin color, texture, turgor normal. No rashes or lesions Lymph nodes: Cervical, supraclavicular, and axillary nodes normal.  Assessment and Plan:  Glaucoma Managed with eye drops.  His blurred vision by the end of the day may be due to dry eye but he prefers to see his opthalmologist for management.   Essential hypertension, benign Well controlled on current regimen of lisinopril , htz and pindolol. Renal function due to be checked, no changes today.  Personal history of colonic polyps Found last year, Not resected due to size.  Will need GI follow up    Updated Medication List Outpatient Encounter Prescriptions as of 10/21/2012  Medication Sig Dispense Refill  . aspirin 81 MG tablet Take 81 mg by mouth daily.      Marland Kitchen doxazosin (CARDURA) 8 MG tablet Take 1 tablet by mouth daily.      Marland Kitchen EPIPEN 2-PAK 0.3 MG/0.3ML SOAJ injection Inject 2 mLs into the muscle as needed.      . hydrochlorothiazide (HYDRODIURIL) 25 MG tablet Take 1 tablet by mouth daily.      Marland Kitchen latanoprost (XALATAN) 0.005 % ophthalmic solution Place 1 drop into both eyes at bedtime.      Marland Kitchen lisinopril (PRINIVIL,ZESTRIL) 40 MG tablet Take 1 tablet by mouth daily.      . naproxen sodium (ANAPROX) 220 MG tablet Take 220 mg by mouth 2 (two) times daily with a meal.      . omeprazole (PRILOSEC) 20 MG capsule Take 1 capsule by mouth daily.      . pindolol (VISKEN) 10 MG tablet Take 1 tablet by mouth daily.      . TDaP (BOOSTRIX) 5-2.5-18.5 LF-MCG/0.5 injection Inject 0.5 mLs into the muscle once.  0.5 mL  0  . zoster vaccine live, PF, (ZOSTAVAX) 29562 UNT/0.65ML injection Inject 19,400 Units into the skin once.  1 each  0   No facility-administered encounter medications on file as of 10/21/2012.

## 2012-10-21 NOTE — Patient Instructions (Addendum)
We are checking your kidney function and electrolytes today .  We will do a "full panel" the day of your medicare wellness exam in December (you can set that up today)   Try to get 3 16 ounce servings of noncaffeinated , nonalcohol beverage daily (preferably water or decaf tea) to help your constipation    You received your annual flu vaccine today    I  recommend that you get your  tetanus-diptheria-pertussis vaccine (TDaP)and Singles vaccine  but you can get them for less $$$ at a local pharmacy with the script I have provided you.

## 2012-10-21 NOTE — Assessment & Plan Note (Signed)
Found last year, Not resected due to size.  Will need GI follow up

## 2012-10-21 NOTE — Assessment & Plan Note (Signed)
Managed with eye drops.  His blurred vision by the end of the day may be due to dry eye but he prefers to see his opthalmologist for management.

## 2012-10-21 NOTE — Assessment & Plan Note (Signed)
Well controlled on current regimen of lisinopril , htz and pindolol. Renal function due to be checked, no changes today.

## 2012-10-22 LAB — CBC WITH DIFFERENTIAL/PLATELET
Basophils Absolute: 0 10*3/uL (ref 0.0–0.1)
Basophils Relative: 0.3 % (ref 0.0–3.0)
Eosinophils Absolute: 0.3 10*3/uL (ref 0.0–0.7)
Eosinophils Relative: 5.3 % — ABNORMAL HIGH (ref 0.0–5.0)
HCT: 37.5 % — ABNORMAL LOW (ref 39.0–52.0)
Hemoglobin: 12.6 g/dL — ABNORMAL LOW (ref 13.0–17.0)
Lymphocytes Relative: 31.6 % (ref 12.0–46.0)
Lymphs Abs: 1.8 10*3/uL (ref 0.7–4.0)
MCHC: 33.5 g/dL (ref 30.0–36.0)
MCV: 92.1 fl (ref 78.0–100.0)
Monocytes Absolute: 0.6 10*3/uL (ref 0.1–1.0)
Monocytes Relative: 9.9 % (ref 3.0–12.0)
Neutro Abs: 3 10*3/uL (ref 1.4–7.7)
Neutrophils Relative %: 52.9 % (ref 43.0–77.0)
Platelets: 225 10*3/uL (ref 150.0–400.0)
RBC: 4.07 Mil/uL — ABNORMAL LOW (ref 4.22–5.81)
RDW: 13.2 % (ref 11.5–14.6)
WBC: 5.6 10*3/uL (ref 4.5–10.5)

## 2012-10-22 LAB — VITAMIN D 25 HYDROXY (VIT D DEFICIENCY, FRACTURES): Vit D, 25-Hydroxy: 45 ng/mL (ref 30–89)

## 2012-10-23 LAB — COMPREHENSIVE METABOLIC PANEL
ALT: 29 U/L (ref 0–53)
AST: 25 U/L (ref 0–37)
Albumin: 4.1 g/dL (ref 3.5–5.2)
Alkaline Phosphatase: 45 U/L (ref 39–117)
BUN: 19 mg/dL (ref 6–23)
CO2: 28 mEq/L (ref 19–32)
Calcium: 9.1 mg/dL (ref 8.4–10.5)
Chloride: 103 mEq/L (ref 96–112)
Creatinine, Ser: 1.2 mg/dL (ref 0.4–1.5)
GFR: 61.28 mL/min (ref >60.00)
Glucose, Bld: 109 mg/dL — ABNORMAL HIGH (ref 70–99)
Potassium: 4.1 mEq/L (ref 3.5–5.1)
Sodium: 140 mEq/L (ref 135–145)
Total Bilirubin: 0.7 mg/dL (ref 0.3–1.2)
Total Protein: 6.8 g/dL (ref 6.0–8.3)

## 2012-10-23 LAB — BASIC METABOLIC PANEL
BUN: 19 mg/dL (ref 6–23)
CO2: 28 mEq/L (ref 19–32)
Calcium: 9.1 mg/dL (ref 8.4–10.5)
Chloride: 103 mEq/L (ref 96–112)
Creatinine, Ser: 1.2 mg/dL (ref 0.4–1.5)
GFR: 61.28 mL/min (ref >60.00)
Glucose, Bld: 109 mg/dL — ABNORMAL HIGH (ref 70–99)
Potassium: 4.1 mEq/L (ref 3.5–5.1)
Sodium: 140 mEq/L (ref 135–145)

## 2012-10-23 LAB — LIPID PANEL
Cholesterol: 178 mg/dL (ref 0–200)
HDL: 42.7 mg/dL (ref >39.00)
LDL Cholesterol: 102 mg/dL — ABNORMAL HIGH (ref 0–99)
Total CHOL/HDL Ratio: 4
Triglycerides: 167 mg/dL — ABNORMAL HIGH (ref 0.0–149.0)
VLDL: 33.4 mg/dL (ref 0.0–40.0)

## 2012-10-23 LAB — TSH: TSH: 0.89 u[IU]/mL (ref 0.35–5.50)

## 2012-10-23 LAB — PSA, MEDICARE: PSA: 2.97 ng/ml (ref 0.10–4.00)

## 2012-10-26 ENCOUNTER — Encounter: Payer: Self-pay | Admitting: *Deleted

## 2012-10-30 DIAGNOSIS — J309 Allergic rhinitis, unspecified: Secondary | ICD-10-CM | POA: Diagnosis not present

## 2012-11-03 DIAGNOSIS — J309 Allergic rhinitis, unspecified: Secondary | ICD-10-CM | POA: Diagnosis not present

## 2012-11-05 ENCOUNTER — Telehealth: Payer: Self-pay | Admitting: Internal Medicine

## 2012-11-05 DIAGNOSIS — M25572 Pain in left ankle and joints of left foot: Secondary | ICD-10-CM

## 2012-11-05 NOTE — Telephone Encounter (Signed)
Spoke with pt, states pain in left foot has worsened since last seen in office. Describes it as a "burning" sensation, worse at night. States it is now beginning to burn up his ankle. Took Aleve last night relief. His ankle was swollen last night, but none today.

## 2012-11-05 NOTE — Telephone Encounter (Signed)
Pt states his foot pain has gotten a lot worse since his last visit.  Pt asking if he needs to be seen again or if he can be referred to a specialist for the pain.  States he is now having trouble walking.  Please advise pt.

## 2012-11-05 NOTE — Telephone Encounter (Signed)
Pt has been notified per [facility] about appt with neurology

## 2012-11-05 NOTE — Telephone Encounter (Signed)
It sounds like he has a neuropathy.  Neurology referral in process.

## 2012-11-06 DIAGNOSIS — J309 Allergic rhinitis, unspecified: Secondary | ICD-10-CM | POA: Diagnosis not present

## 2012-11-09 DIAGNOSIS — M76829 Posterior tibial tendinitis, unspecified leg: Secondary | ICD-10-CM | POA: Diagnosis not present

## 2012-11-09 DIAGNOSIS — M66879 Spontaneous rupture of other tendons, unspecified ankle and foot: Secondary | ICD-10-CM | POA: Diagnosis not present

## 2012-11-10 DIAGNOSIS — J309 Allergic rhinitis, unspecified: Secondary | ICD-10-CM | POA: Diagnosis not present

## 2012-11-13 DIAGNOSIS — J309 Allergic rhinitis, unspecified: Secondary | ICD-10-CM | POA: Diagnosis not present

## 2012-11-17 DIAGNOSIS — J309 Allergic rhinitis, unspecified: Secondary | ICD-10-CM | POA: Diagnosis not present

## 2012-11-20 DIAGNOSIS — J309 Allergic rhinitis, unspecified: Secondary | ICD-10-CM | POA: Diagnosis not present

## 2012-11-24 ENCOUNTER — Telehealth: Payer: Self-pay | Admitting: Internal Medicine

## 2012-11-24 DIAGNOSIS — J309 Allergic rhinitis, unspecified: Secondary | ICD-10-CM | POA: Diagnosis not present

## 2012-11-24 MED ORDER — HYDROCHLOROTHIAZIDE 25 MG PO TABS: 25.0000 mg | ORAL_TABLET | Freq: Every day | ORAL | Status: DC

## 2012-11-24 NOTE — Telephone Encounter (Signed)
Pt came in today stating he was trying to get his hydrochlorothiazide refilled and was told he needed to get an appointment with his pcc  Pt was seen 10/21/12 [facility]

## 2012-12-04 DIAGNOSIS — J309 Allergic rhinitis, unspecified: Secondary | ICD-10-CM | POA: Diagnosis not present

## 2012-12-07 DIAGNOSIS — M66879 Spontaneous rupture of other tendons, unspecified ankle and foot: Secondary | ICD-10-CM | POA: Diagnosis not present

## 2012-12-10 DIAGNOSIS — J309 Allergic rhinitis, unspecified: Secondary | ICD-10-CM | POA: Diagnosis not present

## 2012-12-15 ENCOUNTER — Other Ambulatory Visit: Payer: Self-pay | Admitting: Internal Medicine

## 2012-12-15 DIAGNOSIS — J309 Allergic rhinitis, unspecified: Secondary | ICD-10-CM | POA: Diagnosis not present

## 2012-12-21 ENCOUNTER — Other Ambulatory Visit: Payer: Self-pay | Admitting: *Deleted

## 2012-12-21 MED ORDER — OMEPRAZOLE 20 MG PO CPDR: 20.0000 mg | DELAYED_RELEASE_CAPSULE | Freq: Every day | ORAL | Status: DC

## 2012-12-22 DIAGNOSIS — J309 Allergic rhinitis, unspecified: Secondary | ICD-10-CM | POA: Diagnosis not present

## 2012-12-23 ENCOUNTER — Other Ambulatory Visit: Payer: Medicare Other

## 2012-12-23 ENCOUNTER — Telehealth: Payer: Self-pay | Admitting: Internal Medicine

## 2012-12-23 ENCOUNTER — Telehealth: Payer: Self-pay | Admitting: *Deleted

## 2012-12-23 NOTE — Telephone Encounter (Signed)
What labs and dx?  

## 2012-12-23 NOTE — Telephone Encounter (Signed)
Called pt, his wife picked up and said he had just left for out of town and said he wouldn't be back for 2 days, she said that on the paper he got from his visit; it was written for him to come back for labs?

## 2012-12-23 NOTE — Telephone Encounter (Signed)
What would you like me to do?

## 2012-12-23 NOTE — Telephone Encounter (Signed)
I did not order any!! He had all in October. I see no record of asking him to return for labs.

## 2012-12-23 NOTE — Telephone Encounter (Signed)
Wrong.  It said ' we will do a "full panel on the day you return for your wellness exam." in December

## 2012-12-23 NOTE — Telephone Encounter (Signed)
Pt dropped off medical clearance forms for [facility] to be able to exercise. I put them in Dr. Melina Schools inbox up front. Pt ask if we could fax it back to [facility] (Fax number is on form)

## 2012-12-29 DIAGNOSIS — J309 Allergic rhinitis, unspecified: Secondary | ICD-10-CM | POA: Diagnosis not present

## 2012-12-29 NOTE — Telephone Encounter (Signed)
Forms faxed

## 2012-12-30 ENCOUNTER — Ambulatory Visit: Payer: Medicare Other | Admitting: Internal Medicine

## 2013-01-05 DIAGNOSIS — J309 Allergic rhinitis, unspecified: Secondary | ICD-10-CM | POA: Diagnosis not present

## 2013-01-06 DIAGNOSIS — H3530 Unspecified macular degeneration: Secondary | ICD-10-CM | POA: Diagnosis not present

## 2013-01-06 DIAGNOSIS — H4010X0 Unspecified open-angle glaucoma, stage unspecified: Secondary | ICD-10-CM | POA: Diagnosis not present

## 2013-01-06 DIAGNOSIS — H2510 Age-related nuclear cataract, unspecified eye: Secondary | ICD-10-CM | POA: Diagnosis not present

## 2013-01-06 DIAGNOSIS — H524 Presbyopia: Secondary | ICD-10-CM | POA: Diagnosis not present

## 2013-01-07 DIAGNOSIS — J309 Allergic rhinitis, unspecified: Secondary | ICD-10-CM | POA: Diagnosis not present

## 2013-01-11 ENCOUNTER — Other Ambulatory Visit: Payer: Self-pay | Admitting: *Deleted

## 2013-01-11 MED ORDER — LISINOPRIL 40 MG PO TABS: 40.0000 mg | ORAL_TABLET | Freq: Every day | ORAL | Status: DC

## 2013-01-12 DIAGNOSIS — J309 Allergic rhinitis, unspecified: Secondary | ICD-10-CM | POA: Diagnosis not present

## 2013-01-19 DIAGNOSIS — J309 Allergic rhinitis, unspecified: Secondary | ICD-10-CM | POA: Diagnosis not present

## 2013-01-20 ENCOUNTER — Encounter (INDEPENDENT_AMBULATORY_CARE_PROVIDER_SITE_OTHER): Payer: Self-pay

## 2013-01-20 ENCOUNTER — Encounter: Payer: Self-pay | Admitting: Internal Medicine

## 2013-01-20 ENCOUNTER — Ambulatory Visit (INDEPENDENT_AMBULATORY_CARE_PROVIDER_SITE_OTHER): Payer: Medicare Other | Admitting: Internal Medicine

## 2013-01-20 VITALS — BP 130/60 | HR 71 | Temp 97.9°F | Ht 70.5 in | Wt 209.0 lb

## 2013-01-20 DIAGNOSIS — Z0000 Encounter for general adult medical examination without abnormal findings: Secondary | ICD-10-CM

## 2013-01-20 DIAGNOSIS — R05 Cough: Secondary | ICD-10-CM

## 2013-01-20 DIAGNOSIS — K5900 Constipation, unspecified: Secondary | ICD-10-CM

## 2013-01-20 DIAGNOSIS — I10 Essential (primary) hypertension: Secondary | ICD-10-CM

## 2013-01-20 DIAGNOSIS — R059 Cough, unspecified: Secondary | ICD-10-CM

## 2013-01-20 DIAGNOSIS — K5909 Other constipation: Secondary | ICD-10-CM

## 2013-01-20 DIAGNOSIS — N135 Crossing vessel and stricture of ureter without hydronephrosis: Secondary | ICD-10-CM

## 2013-01-20 DIAGNOSIS — Z86010 Personal history of colonic polyps: Secondary | ICD-10-CM | POA: Diagnosis not present

## 2013-01-20 MED ORDER — PINDOLOL 10 MG PO TABS: 10.0000 mg | ORAL_TABLET | Freq: Every day | ORAL | Status: DC

## 2013-01-20 MED ORDER — HYDROCHLOROTHIAZIDE 25 MG PO TABS: 25.0000 mg | ORAL_TABLET | Freq: Every day | ORAL | Status: DC

## 2013-01-20 MED ORDER — OMEPRAZOLE 20 MG PO CPDR: 20.0000 mg | DELAYED_RELEASE_CAPSULE | Freq: Two times a day (BID) | ORAL | Status: DC

## 2013-01-20 MED ORDER — LISINOPRIL 40 MG PO TABS: 40.0000 mg | ORAL_TABLET | Freq: Every day | ORAL | Status: DC

## 2013-01-20 NOTE — Patient Instructions (Addendum)
Your cough may be coming from reflux, lisinopril,  or post nasal drip (PND).  PND and allergies can be treated with benadryl,  and claritin. Benadryl is the most effective for drying you up but it is also the most sedating,  So try taking it at night and use one of the others in the daytime  For your persistent cough:  Increase the omeprazole to twice daily   Try adding 25 to 50 mg of generic benadryl  (dipenhydramine ) 30 minutes before bed for management of Post nasal drip which may be causing nocturnal cough   Increase your water intake to at least 3  8 ounces of water daily and ok to use the miralax daily .   colace as well is ok to use daily   You may try the samples of Linzess ,  One tablet daily,  Try the lower dose first   Your still need your tetanus-diptheria-pertussis vaccine (TDaP) and Shingles vaccine,  but you can get them for less $$$ at a local pharmacy with the script I have provided you.

## 2013-01-20 NOTE — Progress Notes (Signed)
Pre visit review using our clinic review tool, if applicable. No additional management support is needed unless otherwise documented below in the visit note. 

## 2013-01-20 NOTE — Progress Notes (Signed)
Patient ID: Todd Golden, male   DOB: 05-05-1930, 79 y.o.   MRN: [id number]  The patient is here for annual Medicare wellness examination and management of other chronic and acute problems.     1) Has noticed an intermittent cough that has  Been occurring daily ,  At times when supine,  At times post prandially.   2) Had an episode of severe ankle pain two months ago,  Was referred to [facility] ,  Dr cline diagnosed a torn ligament  Points to his tibialis anterior) after x rays were normal.  offered surgery but declined.  He was pPlaced in a boot for a month which resolved the pain .  At one month follow up was released by cline.  No falls because of it but does have some weakness with dorsiflexion   3) Constipation still an issue despite using supplements 2 to 3 times daily     The risk factors are reflected in the social history.  The roster of all physicians providing medical care to patient - is listed in the Snapshot section of the chart.  Activities of daily living:  The patient is 100% independent in all ADLs: dressing, toileting, feeding as well as independent mobility  Home safety : The patient has smoke detectors in the home. They wear seatbelts.  There are no firearms at home. There is no violence in the home.   There is no risks for hepatitis, STDs or HIV. There is no   history of blood transfusion. They have no travel history to infectious disease endemic areas of the world.  The patient has seen their dentist in the last six month. They have seen their eye doctor in the last year. They admit to slight hearing difficulty with regard to whispered voices and some television programs.  They have deferred audiologic testing in the last year.  They do not  have excessive sun exposure. Discussed the need for sun protection: hats, long sleeves and use of sunscreen if there is significant sun exposure.   Diet: the importance of a healthy diet is discussed. They do have a healthy  diet.  The benefits of regular aerobic exercise were discussed. She walks 4 times per week ,  20 minutes.   Depression screen: there are no signs or vegative symptoms of depression- irritability, change in appetite, anhedonia, sadness/tearfullness.  Cognitive assessment: the patient manages all their financial and personal affairs and is actively engaged. They could relate day,date,year and events; recalled 2/3 objects at 3 minutes; performed clock-face test normally.  The following portions of the patient's history were reviewed and updated as appropriate: allergies, current medications, past family history, past medical history,  past surgical history, past social history  and problem list.  Visual acuity was not assessed per patient preference since she has regular follow up with her ophthalmologist. Hearing and body mass index were assessed and reviewed.   During the course of the visit the patient was educated and counseled about appropriate screening and preventive services including : fall prevention , diabetes screening, nutrition counseling, colorectal cancer screening, and recommended immunizations.    Objective:  BP 130/60  Pulse 71  Temp(Src) 97.9 F (36.6 C)  Ht 5' 10.5" (1.791 m)  Wt 209 lb (94.802 kg)  BMI 29.55 kg/m2  SpO2 97%  General Appearance:    Alert, cooperative, no distress, appears stated age  Head:    Normocephalic, without obvious abnormality, atraumatic  Eyes:    PERRL, conjunctiva/corneas clear,  EOM's intact, fundi    benign, both eyes       Ears:    Normal TM's and external ear canals, both ears  Nose:   Nares normal, septum midline, mucosa normal, no drainage   or sinus tenderness  Throat:   Lips, mucosa, and tongue normal; teeth and gums normal  Neck:   Supple, symmetrical, trachea midline, no adenopathy;       thyroid:  No enlargement/tenderness/nodules; no carotid   bruit or JVD  Back:     Symmetric, no curvature, ROM normal, no CVA tenderness   Lungs:     Clear to auscultation bilaterally, respirations unlabored  Chest wall:    No tenderness or deformity  Heart:    Regular rate and rhythm, S1 and S2 normal, no murmur, rub   or gallop  Abdomen:     Soft, non-tender, bowel sounds active all four quadrants,    no masses, no organomegaly     Extremities:   Extremities normal, atraumatic, no cyanosis or edema  Pulses:   2+ and symmetric all extremities  Skin:   Skin color, texture, turgor normal, no rashes or lesions  Lymph nodes:   Cervical, supraclavicular, and axillary nodes normal  Neurologic:   CNII-XII intact. Normal strength, sensation and reflexes      throughout   Assessment and Plan:  Chronic constipation Advised to increase his water intake .  Trial of linzess,  Samples of both strengths given. checking thyroid and lytes.   Personal history of colonic polyps Not resected due to size, per patient,  By general surgeon who did his colonoscopy several years ago prior to relocation to [place].  He does not want repeat colonoscopy unless he becomes symptomatic.   Ureteral stricture, right He has a history of infrequent  episodes of flank pain described,  Aggravated by large meals,  Relieved by drinking cranberry juice and relaxing   Essential hypertension, benign Well controlled on current regimen. Renal function stable, no changes today.   Visit for preventive health examination Annual male exam was done excluding testicular and prostate exam. PSA was normal last month  .  Colon ca screening was reviewed and options given.    Cough Discussed the possible etiologies of persistent cough including in a never smoker, which include but are not limited to post nasal drip,  Asthma,  GERD and ACE Inhibitor.  Suggested treating increasing prilosec 20 mg bid , adding bedtime benadryl followed by suspenion of lisinopril if persistent   Updated Medication List Outpatient Encounter Prescriptions as of 01/20/2013  Medication  Sig  . aspirin 81 MG tablet Take 81 mg by mouth daily.  Marland Kitchen doxazosin (CARDURA) 8 MG tablet TAKE 1 TABLET BY MOUTH EVERY DAY  . EPIPEN 2-PAK 0.3 MG/0.3ML SOAJ injection Inject 2 mLs into the muscle as needed.  . hydrochlorothiazide (HYDRODIURIL) 25 MG tablet Take 1 tablet (25 mg total) by mouth daily.  Marland Kitchen latanoprost (XALATAN) 0.005 % ophthalmic solution Place 1 drop into both eyes at bedtime.  Marland Kitchen lisinopril (PRINIVIL,ZESTRIL) 40 MG tablet Take 1 tablet (40 mg total) by mouth daily.  . naproxen sodium (ANAPROX) 220 MG tablet Take 220 mg by mouth 2 (two) times daily with a meal.  . omeprazole (PRILOSEC) 20 MG capsule Take 1 capsule (20 mg total) by mouth 2 (two) times daily before a meal.  . pindolol (VISKEN) 10 MG tablet Take 1 tablet (10 mg total) by mouth daily.  . TDaP (BOOSTRIX) 5-2.5-18.5 LF-MCG/0.5 injection Inject 0.5 mLs  into the muscle once.  . zoster vaccine live, PF, (ZOSTAVAX) 16109 UNT/0.65ML injection Inject 19,400 Units into the skin once.  . [DISCONTINUED] hydrochlorothiazide (HYDRODIURIL) 25 MG tablet Take 1 tablet (25 mg total) by mouth daily.  . [DISCONTINUED] hydrochlorothiazide (HYDRODIURIL) 25 MG tablet Take 1 tablet (25 mg total) by mouth daily.  . [DISCONTINUED] lisinopril (PRINIVIL,ZESTRIL) 40 MG tablet Take 1 tablet (40 mg total) by mouth daily.  . [DISCONTINUED] omeprazole (PRILOSEC) 20 MG capsule Take 1 capsule (20 mg total) by mouth daily.  . [DISCONTINUED] pindolol (VISKEN) 10 MG tablet Take 1 tablet by mouth daily.

## 2013-01-21 ENCOUNTER — Encounter: Payer: Self-pay | Admitting: Internal Medicine

## 2013-01-21 DIAGNOSIS — Z0000 Encounter for general adult medical examination without abnormal findings: Secondary | ICD-10-CM | POA: Insufficient documentation

## 2013-01-21 DIAGNOSIS — Z0001 Encounter for general adult medical examination with abnormal findings: Secondary | ICD-10-CM | POA: Insufficient documentation

## 2013-01-21 DIAGNOSIS — K5909 Other constipation: Secondary | ICD-10-CM | POA: Insufficient documentation

## 2013-01-21 DIAGNOSIS — R05 Cough: Secondary | ICD-10-CM | POA: Insufficient documentation

## 2013-01-21 DIAGNOSIS — R059 Cough, unspecified: Secondary | ICD-10-CM | POA: Insufficient documentation

## 2013-01-21 MED ORDER — LINACLOTIDE 145 MCG PO CAPS: 145.0000 ug | ORAL_CAPSULE | Freq: Every day | ORAL | Status: DC

## 2013-01-21 NOTE — Assessment & Plan Note (Signed)
Not resected due to size, per patient,  By general surgeon who did his colonoscopy several years ago prior to relocation to [place].  He does not want repeat colonoscopy unless he becomes symptomatic.

## 2013-01-21 NOTE — Assessment & Plan Note (Signed)
Well controlled on current regimen. Renal function stable, no changes today. 

## 2013-01-21 NOTE — Assessment & Plan Note (Signed)
Advised to increase his water intake .  Trial of linzess,  Samples of both strengths given. checking thyroid and lytes.

## 2013-01-21 NOTE — Assessment & Plan Note (Addendum)
Discussed the possible etiologies of persistent cough including in a never smoker, which include but are not limited to post nasal drip,  Asthma,  GERD and ACE Inhibitor.  Suggested treating increasing prilosec 20 mg bid , adding bedtime benadryl followed by suspenion of lisinopril if persistent

## 2013-01-21 NOTE — Assessment & Plan Note (Signed)
Annual male exam was done excluding testicular and prostate exam. PSA was normal last month  .  Colon ca screening was reviewed and options given.

## 2013-01-21 NOTE — Assessment & Plan Note (Signed)
He has a history of infrequent  episodes of flank pain described,  Aggravated by large meals,  Relieved by drinking cranberry juice and relaxing

## 2013-01-26 DIAGNOSIS — J309 Allergic rhinitis, unspecified: Secondary | ICD-10-CM | POA: Diagnosis not present

## 2013-02-02 DIAGNOSIS — J309 Allergic rhinitis, unspecified: Secondary | ICD-10-CM | POA: Diagnosis not present

## 2013-02-09 DIAGNOSIS — J309 Allergic rhinitis, unspecified: Secondary | ICD-10-CM | POA: Diagnosis not present

## 2013-02-16 DIAGNOSIS — J309 Allergic rhinitis, unspecified: Secondary | ICD-10-CM | POA: Diagnosis not present

## 2013-02-18 DIAGNOSIS — J301 Allergic rhinitis due to pollen: Secondary | ICD-10-CM | POA: Diagnosis not present

## 2013-02-18 DIAGNOSIS — J3089 Other allergic rhinitis: Secondary | ICD-10-CM | POA: Diagnosis not present

## 2013-02-23 DIAGNOSIS — J309 Allergic rhinitis, unspecified: Secondary | ICD-10-CM | POA: Diagnosis not present

## 2013-03-02 DIAGNOSIS — J309 Allergic rhinitis, unspecified: Secondary | ICD-10-CM | POA: Diagnosis not present

## 2013-03-09 DIAGNOSIS — J309 Allergic rhinitis, unspecified: Secondary | ICD-10-CM | POA: Diagnosis not present

## 2013-03-16 DIAGNOSIS — J309 Allergic rhinitis, unspecified: Secondary | ICD-10-CM | POA: Diagnosis not present

## 2013-03-23 DIAGNOSIS — J309 Allergic rhinitis, unspecified: Secondary | ICD-10-CM | POA: Diagnosis not present

## 2013-03-30 DIAGNOSIS — J309 Allergic rhinitis, unspecified: Secondary | ICD-10-CM | POA: Diagnosis not present

## 2013-04-06 DIAGNOSIS — J309 Allergic rhinitis, unspecified: Secondary | ICD-10-CM | POA: Diagnosis not present

## 2013-04-13 DIAGNOSIS — J309 Allergic rhinitis, unspecified: Secondary | ICD-10-CM | POA: Diagnosis not present

## 2013-04-20 DIAGNOSIS — J309 Allergic rhinitis, unspecified: Secondary | ICD-10-CM | POA: Diagnosis not present

## 2013-04-27 DIAGNOSIS — J309 Allergic rhinitis, unspecified: Secondary | ICD-10-CM | POA: Diagnosis not present

## 2013-05-04 DIAGNOSIS — J309 Allergic rhinitis, unspecified: Secondary | ICD-10-CM | POA: Diagnosis not present

## 2013-05-13 DIAGNOSIS — J309 Allergic rhinitis, unspecified: Secondary | ICD-10-CM | POA: Diagnosis not present

## 2013-05-18 DIAGNOSIS — J309 Allergic rhinitis, unspecified: Secondary | ICD-10-CM | POA: Diagnosis not present

## 2013-05-25 DIAGNOSIS — J309 Allergic rhinitis, unspecified: Secondary | ICD-10-CM | POA: Diagnosis not present

## 2013-06-03 DIAGNOSIS — J309 Allergic rhinitis, unspecified: Secondary | ICD-10-CM | POA: Diagnosis not present

## 2013-06-08 DIAGNOSIS — J309 Allergic rhinitis, unspecified: Secondary | ICD-10-CM | POA: Diagnosis not present

## 2013-06-09 ENCOUNTER — Other Ambulatory Visit: Payer: Self-pay | Admitting: Internal Medicine

## 2013-06-09 DIAGNOSIS — J309 Allergic rhinitis, unspecified: Secondary | ICD-10-CM | POA: Diagnosis not present

## 2013-06-15 DIAGNOSIS — J309 Allergic rhinitis, unspecified: Secondary | ICD-10-CM | POA: Diagnosis not present

## 2013-06-22 DIAGNOSIS — J309 Allergic rhinitis, unspecified: Secondary | ICD-10-CM | POA: Diagnosis not present

## 2013-06-29 DIAGNOSIS — J309 Allergic rhinitis, unspecified: Secondary | ICD-10-CM | POA: Diagnosis not present

## 2013-07-06 DIAGNOSIS — J309 Allergic rhinitis, unspecified: Secondary | ICD-10-CM | POA: Diagnosis not present

## 2013-07-13 DIAGNOSIS — J309 Allergic rhinitis, unspecified: Secondary | ICD-10-CM | POA: Diagnosis not present

## 2013-07-20 DIAGNOSIS — J309 Allergic rhinitis, unspecified: Secondary | ICD-10-CM | POA: Diagnosis not present

## 2013-07-22 ENCOUNTER — Encounter: Payer: Self-pay | Admitting: Internal Medicine

## 2013-07-22 ENCOUNTER — Ambulatory Visit (INDEPENDENT_AMBULATORY_CARE_PROVIDER_SITE_OTHER): Payer: Medicare Other | Admitting: Internal Medicine

## 2013-07-22 VITALS — BP 110/70 | HR 66 | Resp 14 | Wt 206.0 lb

## 2013-07-22 DIAGNOSIS — K5900 Constipation, unspecified: Secondary | ICD-10-CM

## 2013-07-22 DIAGNOSIS — R209 Unspecified disturbances of skin sensation: Secondary | ICD-10-CM

## 2013-07-22 DIAGNOSIS — Z79899 Other long term (current) drug therapy: Secondary | ICD-10-CM | POA: Diagnosis not present

## 2013-07-22 DIAGNOSIS — R059 Cough, unspecified: Secondary | ICD-10-CM | POA: Diagnosis not present

## 2013-07-22 DIAGNOSIS — R05 Cough: Secondary | ICD-10-CM

## 2013-07-22 DIAGNOSIS — R200 Anesthesia of skin: Secondary | ICD-10-CM

## 2013-07-22 DIAGNOSIS — K5909 Other constipation: Secondary | ICD-10-CM

## 2013-07-22 DIAGNOSIS — H269 Unspecified cataract: Secondary | ICD-10-CM

## 2013-07-22 DIAGNOSIS — I10 Essential (primary) hypertension: Secondary | ICD-10-CM | POA: Diagnosis not present

## 2013-07-22 DIAGNOSIS — H409 Unspecified glaucoma: Secondary | ICD-10-CM

## 2013-07-22 DIAGNOSIS — K219 Gastro-esophageal reflux disease without esophagitis: Secondary | ICD-10-CM

## 2013-07-22 DIAGNOSIS — N135 Crossing vessel and stricture of ureter without hydronephrosis: Secondary | ICD-10-CM

## 2013-07-22 LAB — BASIC METABOLIC PANEL
BUN: 22 mg/dL (ref 6–23)
CO2: 30 mEq/L (ref 19–32)
Calcium: 9.3 mg/dL (ref 8.4–10.5)
Chloride: 102 mEq/L (ref 96–112)
Creatinine, Ser: 1.2 mg/dL (ref 0.4–1.5)
GFR: 60.59 mL/min (ref >60.00)
Glucose, Bld: 111 mg/dL — ABNORMAL HIGH (ref 70–99)
Potassium: 4 mEq/L (ref 3.5–5.1)
Sodium: 138 mEq/L (ref 135–145)

## 2013-07-22 MED ORDER — AMLODIPINE BESYLATE 5 MG PO TABS: 5.0000 mg | ORAL_TABLET | Freq: Every day | ORAL | Status: DC

## 2013-07-22 NOTE — Progress Notes (Signed)
Patient ID: Todd Golden, male   DOB: 07-26-1930, 80 y.o.   MRN: [id number]  Patient Active Problem List   Diagnosis Date Noted  . Cataracts, bilateral 07/25/2013  . GERD (gastroesophageal reflux disease) 07/25/2013  . Left arm numbness 07/25/2013  . Chronic constipation 01/21/2013  . Visit for preventive health examination 01/21/2013  . Cough 01/21/2013  . Essential hypertension, benign 10/21/2012  . Glaucoma 10/21/2012  . Personal history of colonic polyps 10/21/2012  . Ureteral stricture, right 10/21/2012    Subjective:  CC:   Chief Complaint  Patient presents with  . Follow-up    6 month followup    HPI:   Todd Golden is a 80 y.o. male who presents for Follow up on chronic conditions including hypertension, ureteral stricture, and nonproductive cough, At last visit he was advised to take a daily PPI and 25 mg benadryl at bedtime to treat reflux and PND for a few weeks to see if the cough resolved.  He reports today that he continues to have a dry cough throughout during the day, but not  during the night ,  Still taking lisinopril. Denies fevers, night sweats, dyspnea, and unintentional weight loss.   He has been waking up with transient left arm numbness. He notices that this only occurs when he sleeps on his right side .  He denies pain and weakness on the left side, and the numbness resolves after a few minutes.     Past Medical History  Diagnosis Date  . Asthma   . Arthritis   . Glaucoma   . Allergy   . Hypertension   . Colon polyps   . Chronic kidney disease     stent    Past Surgical History  Procedure Laterality Date  . Joint replacement Left 2014    knee partial replacement  . Kidney stent Right 1995       The following portions of the patient's history were reviewed and updated as appropriate: Allergies, current medications, and problem list.    Review of Systems:   Patient denies headache, fevers, malaise, unintentional weight loss,  skin rash, eye pain, sinus congestion and sinus pain, sore throat, dysphagia,  hemoptysis , cough, dyspnea, wheezing, chest pain, palpitations, orthopnea, edema, abdominal pain, nausea, melena, diarrhea, constipation, flank pain, dysuria, hematuria, urinary  Frequency, nocturia, numbness, tingling, seizures,  Focal weakness, Loss of consciousness,  Tremor, insomnia, depression, anxiety, and suicidal ideation.     History   Social History  . Marital Status: Married    Spouse Name: N/A    Number of Children: N/A  . Years of Education: N/A   Occupational History  . Not on file.   Social History Main Topics  . Smoking status: Never Smoker   . Smokeless tobacco: Never Used  . Alcohol Use: Yes  . Drug Use: No  . Sexual Activity: Not Currently   Other Topics Concern  . Not on file   Social History Narrative  . No narrative on file    Objective:  Filed Vitals:   07/22/13 0803  BP: 110/70  Pulse: 66  Resp: 14     General appearance: alert, cooperative and appears stated age Ears: normal TM's and external ear canals both ears Throat: lips, mucosa, and tongue normal; teeth and gums normal Neck: no adenopathy, no carotid bruit, supple, symmetrical, trachea midline and thyroid not enlarged, symmetric, no tenderness/mass/nodules Back: symmetric, no curvature. ROM normal. No CVA tenderness. Lungs: clear to auscultation bilaterally Heart: regular  rate and rhythm, S1, S2 normal, no murmur, click, rub or gallop Abdomen: soft, non-tender; bowel sounds normal; no masses,  no organomegaly Pulses: 2+ and symmetric Skin: Skin color, texture, turgor normal. No rashes or lesions Lymph nodes: Cervical, supraclavicular, and axillary nodes normal.  Assessment and Plan:  Chronic constipation Improved with high dose linzess but the medication is not affordable . He has been increasing his daily fiber intake and has seen an improvement in stool frequency and ease of evacuation.     Cough Likely lisinopril induced.  Advised to stop ACE Inhibitor for 3 months. Amlodipine alternative.  If no improvement pulmonary evaluation needed with chest x ray and pFTs  Essential hypertension, benign Well controlled on lisinopril, and  Renal function stable, .but cough persists.  Changing to amlodipine 5 mg daily  Lab Results  Component Value Date   CREATININE 1.2 07/22/2013   Lab Results  Component Value Date   NA 138 07/22/2013   K 4.0 07/22/2013   CL 102 07/22/2013   CO2 30 07/22/2013     Ureteral stricture, right Symptoms recur infrequently after large meals and are relieved with positional changes and cranberry juice.   Cataracts, bilateral With no recent follow up,  Requesting referral to new opthalmologist, Referral to [place].   GERD (gastroesophageal reflux disease) No change in cough with bid PPI use.  Resume once daily dosing   Glaucoma Needs ophthalmology referral.   Left arm numbness Neurologic exam is normal and history suggests that his recurrent numbness brought on by sleeping on right side is due to cervical spondylosis.  Advised to buy a pillow with cervical support to keep spine straight, or resume supine position.   A total of 40 minutes was spent with patient more than half of which was spent in evaluation and counseling on the above conditions and coordination of care.  Updated Medication List Outpatient Encounter Prescriptions as of 07/22/2013  Medication Sig  . aspirin 81 MG tablet Take 81 mg by mouth daily.  Marland Kitchen doxazosin (CARDURA) 8 MG tablet TAKE 1 TABLET BY MOUTH EVERY DAY  . hydrochlorothiazide (HYDRODIURIL) 25 MG tablet Take 1 tablet (25 mg total) by mouth daily.  Marland Kitchen latanoprost (XALATAN) 0.005 % ophthalmic solution Place 1 drop into both eyes at bedtime.  Marland Kitchen omeprazole (PRILOSEC) 20 MG capsule Take 1 capsule (20 mg total) by mouth 2 (two) times daily before a meal.  . pindolol (VISKEN) 10 MG tablet Take 1 tablet (10 mg total) by mouth daily.   . [DISCONTINUED] lisinopril (PRINIVIL,ZESTRIL) 40 MG tablet Take 1 tablet (40 mg total) by mouth daily.  Marland Kitchen amLODipine (NORVASC) 5 MG tablet Take 1 tablet (5 mg total) by mouth daily.  Marland Kitchen EPIPEN 2-PAK 0.3 MG/0.3ML SOAJ injection Inject 2 mLs into the muscle as needed.  . naproxen sodium (ANAPROX) 220 MG tablet Take 220 mg by mouth daily as needed.   . zoster vaccine live, PF, (ZOSTAVAX) 41740 UNT/0.65ML injection Inject 19,400 Units into the skin once.  . [DISCONTINUED] Linaclotide (LINZESS) 145 MCG CAPS capsule Take 1 capsule (145 mcg total) by mouth daily.  . [DISCONTINUED] TDaP (BOOSTRIX) 5-2.5-18.5 LF-MCG/0.5 injection Inject 0.5 mLs into the muscle once.     Orders Placed This Encounter  Procedures  . Basic metabolic panel  . Ambulatory referral to Ophthalmology    Return in about 6 months (around 01/22/2014).

## 2013-07-22 NOTE — Patient Instructions (Addendum)
Suspend the lisinopril whren you finish current bottle  Use amlodipine 5 mg daily instead.  If cough is still present after 2 months, call to discuss further workup  You can continue your reflux medication once daily .   I am referring you to Dr George Ina to manage your cataract   Basic metabolic profile to be done today

## 2013-07-22 NOTE — Assessment & Plan Note (Addendum)
Well controlled on lisinopril, and  Renal function stable, .but cough persists.  Changing to amlodipine 5 mg daily  Lab Results  Component Value Date   CREATININE 1.2 07/22/2013   Lab Results  Component Value Date   NA 138 07/22/2013   K 4.0 07/22/2013   CL 102 07/22/2013   CO2 30 07/22/2013

## 2013-07-22 NOTE — Assessment & Plan Note (Addendum)
Likely lisinopril induced.  Advised to stop ACE Inhibitor for 3 months. Amlodipine alternative.  If no improvement pulmonary evaluation needed with chest x ray and pFTs

## 2013-07-22 NOTE — Progress Notes (Signed)
Pre visit review using our clinic review tool, if applicable. No additional management support is needed unless otherwise documented below in the visit note. 

## 2013-07-22 NOTE — Assessment & Plan Note (Addendum)
Improved with high dose linzess but the medication is not affordable . He has been increasing his daily fiber intake and has seen an improvement in stool frequency and ease of evacuation.

## 2013-07-25 DIAGNOSIS — K219 Gastro-esophageal reflux disease without esophagitis: Secondary | ICD-10-CM | POA: Insufficient documentation

## 2013-07-25 DIAGNOSIS — R200 Anesthesia of skin: Secondary | ICD-10-CM | POA: Insufficient documentation

## 2013-07-25 DIAGNOSIS — H269 Unspecified cataract: Secondary | ICD-10-CM | POA: Insufficient documentation

## 2013-07-25 MED ORDER — OMEPRAZOLE 20 MG PO CPDR: 20.0000 mg | DELAYED_RELEASE_CAPSULE | Freq: Every day | ORAL | Status: DC

## 2013-07-25 NOTE — Assessment & Plan Note (Signed)
Neurologic exam is normal and history suggests that his recurrent numbness brought on by sleeping on right side is due to cervical spondylosis.  Advised to buy a pillow with cervical support to keep spine straight, or resume supine position.

## 2013-07-25 NOTE — Assessment & Plan Note (Signed)
No change in cough with bid PPI use.  Resume once daily dosing

## 2013-07-25 NOTE — Assessment & Plan Note (Signed)
With no recent follow up,  Requesting referral to new opthalmologist, Referral to [place].

## 2013-07-25 NOTE — Assessment & Plan Note (Signed)
Needs ophthalmology referral

## 2013-07-25 NOTE — Assessment & Plan Note (Signed)
Symptoms recur infrequently after large meals and are relieved with positional changes and cranberry juice.

## 2013-07-26 ENCOUNTER — Encounter: Payer: Self-pay | Admitting: *Deleted

## 2013-07-26 ENCOUNTER — Telehealth: Payer: Self-pay | Admitting: Internal Medicine

## 2013-07-26 NOTE — Telephone Encounter (Signed)
Relevant patient education assigned to patient using Emmi. ° °

## 2013-07-27 DIAGNOSIS — J309 Allergic rhinitis, unspecified: Secondary | ICD-10-CM | POA: Diagnosis not present

## 2013-08-03 DIAGNOSIS — J309 Allergic rhinitis, unspecified: Secondary | ICD-10-CM | POA: Diagnosis not present

## 2013-08-06 DIAGNOSIS — H40059 Ocular hypertension, unspecified eye: Secondary | ICD-10-CM | POA: Diagnosis not present

## 2013-08-06 DIAGNOSIS — H353190 Nonexudative age-related macular degeneration, unspecified eye, stage unspecified: Secondary | ICD-10-CM | POA: Diagnosis not present

## 2013-08-10 DIAGNOSIS — J309 Allergic rhinitis, unspecified: Secondary | ICD-10-CM | POA: Diagnosis not present

## 2013-08-17 DIAGNOSIS — J309 Allergic rhinitis, unspecified: Secondary | ICD-10-CM | POA: Diagnosis not present

## 2013-08-24 DIAGNOSIS — J309 Allergic rhinitis, unspecified: Secondary | ICD-10-CM | POA: Diagnosis not present

## 2013-08-31 DIAGNOSIS — J309 Allergic rhinitis, unspecified: Secondary | ICD-10-CM | POA: Diagnosis not present

## 2013-09-07 DIAGNOSIS — J309 Allergic rhinitis, unspecified: Secondary | ICD-10-CM | POA: Diagnosis not present

## 2013-09-14 DIAGNOSIS — J309 Allergic rhinitis, unspecified: Secondary | ICD-10-CM | POA: Diagnosis not present

## 2013-09-21 DIAGNOSIS — J309 Allergic rhinitis, unspecified: Secondary | ICD-10-CM | POA: Diagnosis not present

## 2013-09-28 DIAGNOSIS — J309 Allergic rhinitis, unspecified: Secondary | ICD-10-CM | POA: Diagnosis not present

## 2013-10-07 DIAGNOSIS — J309 Allergic rhinitis, unspecified: Secondary | ICD-10-CM | POA: Diagnosis not present

## 2013-10-18 ENCOUNTER — Other Ambulatory Visit: Payer: Self-pay | Admitting: Internal Medicine

## 2013-10-18 MED ORDER — HYDROCHLOROTHIAZIDE 25 MG PO TABS: 25.0000 mg | ORAL_TABLET | Freq: Every day | ORAL | Status: DC

## 2013-10-19 DIAGNOSIS — J309 Allergic rhinitis, unspecified: Secondary | ICD-10-CM | POA: Diagnosis not present

## 2013-10-22 DIAGNOSIS — Z23 Encounter for immunization: Secondary | ICD-10-CM | POA: Diagnosis not present

## 2013-10-26 DIAGNOSIS — J301 Allergic rhinitis due to pollen: Secondary | ICD-10-CM | POA: Diagnosis not present

## 2013-10-26 DIAGNOSIS — J3089 Other allergic rhinitis: Secondary | ICD-10-CM | POA: Diagnosis not present

## 2013-11-02 DIAGNOSIS — J3089 Other allergic rhinitis: Secondary | ICD-10-CM | POA: Diagnosis not present

## 2013-11-02 DIAGNOSIS — J301 Allergic rhinitis due to pollen: Secondary | ICD-10-CM | POA: Diagnosis not present

## 2013-11-11 DIAGNOSIS — J301 Allergic rhinitis due to pollen: Secondary | ICD-10-CM | POA: Diagnosis not present

## 2013-11-11 DIAGNOSIS — J3089 Other allergic rhinitis: Secondary | ICD-10-CM | POA: Diagnosis not present

## 2013-11-16 DIAGNOSIS — J3089 Other allergic rhinitis: Secondary | ICD-10-CM | POA: Diagnosis not present

## 2013-11-16 DIAGNOSIS — J301 Allergic rhinitis due to pollen: Secondary | ICD-10-CM | POA: Diagnosis not present

## 2013-11-22 DIAGNOSIS — J301 Allergic rhinitis due to pollen: Secondary | ICD-10-CM | POA: Diagnosis not present

## 2013-11-22 DIAGNOSIS — J3089 Other allergic rhinitis: Secondary | ICD-10-CM | POA: Diagnosis not present

## 2013-11-23 DIAGNOSIS — J3089 Other allergic rhinitis: Secondary | ICD-10-CM | POA: Diagnosis not present

## 2013-11-23 DIAGNOSIS — J301 Allergic rhinitis due to pollen: Secondary | ICD-10-CM | POA: Diagnosis not present

## 2013-11-30 DIAGNOSIS — J3089 Other allergic rhinitis: Secondary | ICD-10-CM | POA: Diagnosis not present

## 2013-11-30 DIAGNOSIS — J301 Allergic rhinitis due to pollen: Secondary | ICD-10-CM | POA: Diagnosis not present

## 2013-12-03 ENCOUNTER — Inpatient Hospital Stay: Payer: Self-pay | Admitting: Internal Medicine

## 2013-12-03 ENCOUNTER — Telehealth: Payer: Self-pay | Admitting: Internal Medicine

## 2013-12-03 DIAGNOSIS — I351 Nonrheumatic aortic (valve) insufficiency: Secondary | ICD-10-CM | POA: Diagnosis not present

## 2013-12-03 DIAGNOSIS — E785 Hyperlipidemia, unspecified: Secondary | ICD-10-CM | POA: Diagnosis not present

## 2013-12-03 DIAGNOSIS — R270 Ataxia, unspecified: Secondary | ICD-10-CM | POA: Diagnosis present

## 2013-12-03 DIAGNOSIS — R479 Unspecified speech disturbances: Secondary | ICD-10-CM | POA: Diagnosis not present

## 2013-12-03 DIAGNOSIS — I63519 Cerebral infarction due to unspecified occlusion or stenosis of unspecified middle cerebral artery: Secondary | ICD-10-CM | POA: Diagnosis present

## 2013-12-03 DIAGNOSIS — N400 Enlarged prostate without lower urinary tract symptoms: Secondary | ICD-10-CM | POA: Diagnosis not present

## 2013-12-03 DIAGNOSIS — Z96652 Presence of left artificial knee joint: Secondary | ICD-10-CM | POA: Diagnosis present

## 2013-12-03 DIAGNOSIS — I639 Cerebral infarction, unspecified: Secondary | ICD-10-CM | POA: Diagnosis not present

## 2013-12-03 DIAGNOSIS — R4701 Aphasia: Secondary | ICD-10-CM | POA: Diagnosis not present

## 2013-12-03 DIAGNOSIS — R4789 Other speech disturbances: Secondary | ICD-10-CM | POA: Diagnosis not present

## 2013-12-03 DIAGNOSIS — I10 Essential (primary) hypertension: Secondary | ICD-10-CM | POA: Diagnosis not present

## 2013-12-03 DIAGNOSIS — I6521 Occlusion and stenosis of right carotid artery: Secondary | ICD-10-CM | POA: Diagnosis not present

## 2013-12-03 DIAGNOSIS — K219 Gastro-esophageal reflux disease without esophagitis: Secondary | ICD-10-CM | POA: Diagnosis present

## 2013-12-03 DIAGNOSIS — H409 Unspecified glaucoma: Secondary | ICD-10-CM | POA: Diagnosis present

## 2013-12-03 DIAGNOSIS — R130 Aphagia: Secondary | ICD-10-CM | POA: Diagnosis present

## 2013-12-03 LAB — HEPATIC FUNCTION PANEL A (ARMC)
Albumin: 3.5 g/dL (ref 3.4–5.0)
Alkaline Phosphatase: 60 U/L
Bilirubin, Direct: <0.1 mg/dL (ref 0.0–0.2)
Bilirubin,Total: 0.6 mg/dL (ref 0.2–1.0)
SGOT(AST): 30 U/L (ref 15–37)
SGPT (ALT): 29 U/L
Total Protein: 7.2 g/dL (ref 6.4–8.2)

## 2013-12-03 LAB — URINALYSIS, COMPLETE
Bacteria: NEGATIVE
Bilirubin,UR: NEGATIVE
Blood: NEGATIVE
Glucose,UR: NEGATIVE mg/dL (ref 0–75)
Ketone: NEGATIVE
Leukocyte Esterase: NEGATIVE
Nitrite: NEGATIVE
Ph: 5 (ref 4.5–8.0)
Protein: NEGATIVE
Specific Gravity: 1.005 (ref 1.003–1.030)
Squamous Epithelial: NONE SEEN

## 2013-12-03 LAB — CK TOTAL AND CKMB (NOT AT ARMC)
CK, Total: 187 U/L
CK, Total: 184 U/L
CK-MB: 3.9 ng/mL — ABNORMAL HIGH (ref 0.5–3.6)
CK-MB: 3.8 ng/mL — ABNORMAL HIGH (ref 0.5–3.6)

## 2013-12-03 LAB — CBC
HCT: 38.8 % — ABNORMAL LOW (ref 40.0–52.0)
HGB: 12.9 g/dL — ABNORMAL LOW (ref 13.0–18.0)
MCH: 31.4 pg (ref 26.0–34.0)
MCHC: 33.2 g/dL (ref 32.0–36.0)
MCV: 94 fL (ref 80–100)
Platelet: 200 10*3/uL (ref 150–440)
RBC: 4.11 10*6/uL — ABNORMAL LOW (ref 4.40–5.90)
RDW: 12.9 % (ref 11.5–14.5)
WBC: 5.1 10*3/uL (ref 3.8–10.6)

## 2013-12-03 LAB — TROPONIN I
Troponin-I: <0.02 ng/mL
Troponin-I: <0.02 ng/mL
Troponin-I: <0.02 ng/mL

## 2013-12-03 LAB — BASIC METABOLIC PANEL
Anion Gap: 8 (ref 7–16)
BUN: 21 mg/dL — ABNORMAL HIGH (ref 7–18)
Calcium, Total: 8.2 mg/dL — ABNORMAL LOW (ref 8.5–10.1)
Chloride: 105 mmol/L (ref 98–107)
Co2: 29 mmol/L (ref 21–32)
Creatinine: 1.19 mg/dL (ref 0.60–1.30)
EGFR (African American): >60
EGFR (Non-African Amer.): >60
Glucose: 101 mg/dL — ABNORMAL HIGH (ref 65–99)
Osmolality: 286 (ref 275–301)
Potassium: 3.7 mmol/L (ref 3.5–5.1)
Sodium: 142 mmol/L (ref 136–145)

## 2013-12-03 LAB — CK-MB: CK-MB: 3.5 ng/mL (ref 0.5–3.6)

## 2013-12-03 LAB — HEMOGLOBIN A1C: Hemoglobin A1C: 5.9 % (ref 4.2–6.3)

## 2013-12-03 LAB — TSH: Thyroid Stimulating Horm: 1.68 u[IU]/mL

## 2013-12-03 NOTE — Telephone Encounter (Signed)
Advised patient wife to take to the ED, Patient on set of symptoms were sudden. Spoke with RN and she agreed patient should go on to the ER.

## 2013-12-04 DIAGNOSIS — I351 Nonrheumatic aortic (valve) insufficiency: Secondary | ICD-10-CM

## 2013-12-04 LAB — CBC AND DIFFERENTIAL
HCT: 39 % — AB (ref 41–53)
Hemoglobin: 13.2 g/dL — AB (ref 13.5–17.5)
Platelets: 201 10*3/uL (ref 150–399)
WBC: 6.3 10^3/mL

## 2013-12-04 LAB — BASIC METABOLIC PANEL
Anion Gap: 7 (ref 7–16)
BUN: 16 mg/dL (ref 4–21)
BUN: 16 mg/dL (ref 7–18)
Calcium, Total: 8.2 mg/dL — ABNORMAL LOW (ref 8.5–10.1)
Chloride: 107 mmol/L (ref 98–107)
Co2: 30 mmol/L (ref 21–32)
Creatinine: 1.1 mg/dL (ref 0.6–1.3)
Creatinine: 1.11 mg/dL (ref 0.60–1.30)
EGFR (African American): >60
EGFR (Non-African Amer.): >60
Glucose: 93 mg/dL
Glucose: 93 mg/dL (ref 65–99)
Osmolality: 288 (ref 275–301)
Potassium: 3.5 mmol/L (ref 3.4–5.3)
Potassium: 3.5 mmol/L (ref 3.5–5.1)
Sodium: 144 mmol/L (ref 136–145)
Sodium: 144 mmol/L (ref 137–147)

## 2013-12-04 LAB — CBC WITH DIFFERENTIAL/PLATELET
Basophil #: 0 10*3/uL (ref 0.0–0.1)
Basophil %: 0.5 %
Eosinophil #: 0.2 10*3/uL (ref 0.0–0.7)
Eosinophil %: 3.8 %
HCT: 39 % — ABNORMAL LOW (ref 40.0–52.0)
HGB: 13.2 g/dL (ref 13.0–18.0)
Lymphocyte #: 2.1 10*3/uL (ref 1.0–3.6)
Lymphocyte %: 34.1 %
MCH: 31.7 pg (ref 26.0–34.0)
MCHC: 33.9 g/dL (ref 32.0–36.0)
MCV: 94 fL (ref 80–100)
Monocyte #: 0.9 x10 3/mm (ref 0.2–1.0)
Monocyte %: 13.7 %
Neutrophil #: 3 10*3/uL (ref 1.4–6.5)
Neutrophil %: 47.9 %
Platelet: 201 10*3/uL (ref 150–440)
RBC: 4.17 10*6/uL — ABNORMAL LOW (ref 4.40–5.90)
RDW: 12.9 % (ref 11.5–14.5)
WBC: 6.3 10*3/uL (ref 3.8–10.6)

## 2013-12-04 LAB — LIPID PANEL
Cholesterol: 168 mg/dL (ref 0–200)
Cholesterol: 168 mg/dL (ref 0–200)
HDL Cholesterol: 44 mg/dL (ref 40–60)
HDL: 44 mg/dL (ref 35–70)
LDL Cholesterol: 105 mg/dL
Ldl Cholesterol, Calc: 105 mg/dL — ABNORMAL HIGH (ref 0–100)
Triglycerides: 96 mg/dL (ref 0–200)
Triglycerides: 96 mg/dL (ref 40–160)
VLDL Cholesterol, Calc: 19 mg/dL (ref 5–40)

## 2013-12-04 LAB — TSH: TSH: 1.68 u[IU]/mL (ref 0.41–5.90)

## 2013-12-04 LAB — HEPATIC FUNCTION PANEL
ALT: 29 U/L (ref 10–40)
AST: 30 U/L (ref 14–40)
Alkaline Phosphatase: 60 U/L (ref 25–125)
Bilirubin, Total: 0.6 mg/dL

## 2013-12-04 LAB — HEMOGLOBIN A1C: Hgb A1c MFr Bld: 5.9 % (ref 4.0–6.0)

## 2013-12-06 ENCOUNTER — Telehealth: Payer: Self-pay | Admitting: Internal Medicine

## 2013-12-06 DIAGNOSIS — I639 Cerebral infarction, unspecified: Secondary | ICD-10-CM

## 2013-12-06 NOTE — Telephone Encounter (Signed)
Patient scheduled for Friday 12/10/13 have sent for records.

## 2013-12-06 NOTE — Telephone Encounter (Signed)
ORDER FOR SPEECH THERAPY PRINTED

## 2013-12-06 NOTE — Telephone Encounter (Signed)
We have a 3.45 on Friday but it is just a 15 min spot ok to use.

## 2013-12-06 NOTE — Telephone Encounter (Signed)
Ok to use?

## 2013-12-06 NOTE — Telephone Encounter (Signed)
Patient was diagnosed with CVA on Friday Traci call from Rehab request order for speech therapy.

## 2013-12-06 NOTE — Telephone Encounter (Signed)
The patient is needing a hospital follow up appointment.

## 2013-12-07 ENCOUNTER — Telehealth: Payer: Self-pay | Admitting: *Deleted

## 2013-12-07 DIAGNOSIS — R009 Unspecified abnormalities of heart beat: Secondary | ICD-10-CM

## 2013-12-07 DIAGNOSIS — J301 Allergic rhinitis due to pollen: Secondary | ICD-10-CM | POA: Diagnosis not present

## 2013-12-07 DIAGNOSIS — J3089 Other allergic rhinitis: Secondary | ICD-10-CM | POA: Diagnosis not present

## 2013-12-07 NOTE — Telephone Encounter (Signed)
Left vm for pt to return my call, Need to do a TCM follow up call for recent hospitalization

## 2013-12-08 ENCOUNTER — Encounter: Payer: Self-pay | Admitting: Cardiovascular Disease

## 2013-12-08 ENCOUNTER — Encounter: Payer: Self-pay | Admitting: Internal Medicine

## 2013-12-08 DIAGNOSIS — I639 Cerebral infarction, unspecified: Secondary | ICD-10-CM | POA: Diagnosis not present

## 2013-12-08 DIAGNOSIS — I69320 Aphasia following cerebral infarction: Secondary | ICD-10-CM | POA: Diagnosis not present

## 2013-12-08 NOTE — Telephone Encounter (Signed)
Discharge Date: 12/04/13  Transition Care Management Follow-up Telephone Call  How have you been since you were released from the hospital? Pt's wife states " doing okay, he is starting to speak better, went to speech therapy this morning"    Do you understand why you were in the hospital? YES   Do you understand the discharge instrcutions? YES  Items Reviewed:  Medications reviewed: YES  Allergies reviewed: NO  Dietary changes reviewed: YES  Referrals reviewed: YES   Functional Questionnaire:   Activities of Daily Living (ADLs):   He states they are independent in the following:  States they require assistance with the following:    Any transportation issues/concerns?: NO   Any patient concerns? NO   Confirmed importance and date/time of follow-up visits scheduled: YES, Nov 20 @ 3:45   Confirmed with patient if condition begins to worsen call PCP or go to the ER.  Patient was given the Call-a-Nurse line [phone]: YES

## 2013-12-09 ENCOUNTER — Encounter: Payer: Self-pay | Admitting: *Deleted

## 2013-12-09 DIAGNOSIS — J301 Allergic rhinitis due to pollen: Secondary | ICD-10-CM | POA: Diagnosis not present

## 2013-12-09 DIAGNOSIS — J3089 Other allergic rhinitis: Secondary | ICD-10-CM | POA: Diagnosis not present

## 2013-12-10 ENCOUNTER — Encounter: Payer: Self-pay | Admitting: *Deleted

## 2013-12-10 ENCOUNTER — Ambulatory Visit (INDEPENDENT_AMBULATORY_CARE_PROVIDER_SITE_OTHER): Payer: Medicare Other | Admitting: Internal Medicine

## 2013-12-10 ENCOUNTER — Encounter: Payer: Self-pay | Admitting: Internal Medicine

## 2013-12-10 VITALS — BP 160/90 | HR 63 | Temp 97.7°F | Resp 16 | Ht 70.5 in | Wt 197.8 lb

## 2013-12-10 DIAGNOSIS — I63312 Cerebral infarction due to thrombosis of left middle cerebral artery: Secondary | ICD-10-CM

## 2013-12-10 DIAGNOSIS — E785 Hyperlipidemia, unspecified: Secondary | ICD-10-CM

## 2013-12-10 DIAGNOSIS — I10 Essential (primary) hypertension: Secondary | ICD-10-CM | POA: Diagnosis not present

## 2013-12-10 DIAGNOSIS — K219 Gastro-esophageal reflux disease without esophagitis: Secondary | ICD-10-CM | POA: Diagnosis not present

## 2013-12-10 DIAGNOSIS — Z79899 Other long term (current) drug therapy: Secondary | ICD-10-CM | POA: Diagnosis not present

## 2013-12-10 DIAGNOSIS — I69320 Aphasia following cerebral infarction: Secondary | ICD-10-CM | POA: Diagnosis not present

## 2013-12-10 DIAGNOSIS — E559 Vitamin D deficiency, unspecified: Secondary | ICD-10-CM

## 2013-12-10 DIAGNOSIS — Z23 Encounter for immunization: Secondary | ICD-10-CM | POA: Diagnosis not present

## 2013-12-10 DIAGNOSIS — I679 Cerebrovascular disease, unspecified: Secondary | ICD-10-CM | POA: Insufficient documentation

## 2013-12-10 DIAGNOSIS — I639 Cerebral infarction, unspecified: Secondary | ICD-10-CM | POA: Diagnosis not present

## 2013-12-10 MED ORDER — PANTOPRAZOLE SODIUM 40 MG PO TBEC: 40.0000 mg | DELAYED_RELEASE_TABLET | Freq: Every day | ORAL | Status: DC

## 2013-12-10 MED ORDER — AMLODIPINE BESYLATE 5 MG PO TABS: 5.0000 mg | ORAL_TABLET | Freq: Every day | ORAL | Status: DC

## 2013-12-10 MED ORDER — CLOPIDOGREL BISULFATE 75 MG PO TABS: 75.0000 mg | ORAL_TABLET | Freq: Every day | ORAL | Status: DC

## 2013-12-10 MED ORDER — SIMVASTATIN 20 MG PO TABS: 20.0000 mg | ORAL_TABLET | Freq: Every day | ORAL | Status: DC

## 2013-12-10 MED ORDER — HYDROCHLOROTHIAZIDE 25 MG PO TABS: 25.0000 mg | ORAL_TABLET | Freq: Every day | ORAL | Status: DC

## 2013-12-10 NOTE — Patient Instructions (Addendum)
Resume the hctz and the amlodipine  for your blood pressure    Continue taking the simvastatin, aspirin and Plavix  to decrease your risk of stroke  You have lost 10 lbs since the last visit !  You can continue the diet you have been on .    You can still have one alcoholic beverage at night  You can resume driving.   Try the "Flat ou"  t thin breads (look for them at [employer] on the bread kiosk) "traditional white and rosemary/olive oil.  They taste much better than the other flatbreads   Return in January with fasting labs prior to your visit

## 2013-12-10 NOTE — Progress Notes (Signed)
Pre-visit discussion using our clinic review tool. No additional management support is needed unless otherwise documented below in the visit note.  

## 2013-12-10 NOTE — Progress Notes (Signed)
Patient ID: Todd Golden, male   DOB: [birth year], 80 y.o.   MRN: [id number] Patient Active Problem List   Diagnosis Date Noted  . Hyperlipidemia LDL goal <100 12/12/2013  . CVA (cerebral infarction) 12/10/2013  . Cataracts, bilateral 07/25/2013  . GERD (gastroesophageal reflux disease) 07/25/2013  . Left arm numbness 07/25/2013  . Chronic constipation 01/21/2013  . Visit for preventive health examination 01/21/2013  . Cough 01/21/2013  . Essential hypertension, benign 10/21/2012  . Glaucoma 10/21/2012  . Personal history of colonic polyps 10/21/2012  . Ureteral stricture, right 10/21/2012    Subjective:  CC:   Chief Complaint  Patient presents with  . Follow-up    Hospital     HPI:   Todd Golden is a 80 y.o. male who presents for hospitall  follow up.  Patient wa admitted to [facility] on Nov 13 th with neurologic changes including an expressive aphasia and mild ataxia, and was determined to have suffered a  Nonhemorrhagic left  cerebral artery  Stroke.  He was evaluated by cardiology with a TTE and telemetry for  24 hours.  Neurology and Speech Therapy evaluation was done  And he was  discharged on plavix and ASA. On Nov 14th,  His amlodipine,  HCTZ and omeprazole  were held at discharge and protonix was substituted. His BP at discharge on  11/14 was 154/85.  He is accompanied by his wife.  He has regained his speech and ability to write, with some occasional difficulty with word finding.  Has ongoing speech therapy as an outpatient.  Has questions about  Why he needs Zocor since his LDL was 105.    Past Medical History  Diagnosis Date  . Asthma   . Arthritis   . Glaucoma   . Allergy   . Hypertension   . Colon polyps   . Chronic kidney disease     stent  . Stroke   . Hyperlipidemia   . Chronic constipation   . Cough   . Cataract   . Ureteral stricture   . GERD (gastroesophageal reflux disease)     Past Surgical History  Procedure Laterality Date  . Joint  replacement Left 2014    knee partial replacement  . Kidney stent Right 1995       The following portions of the patient's history were reviewed and updated as appropriate: Allergies, current medications, and problem list.    Review of Systems:   Patient denies headache, fevers, malaise, unintentional weight loss, skin rash, eye pain, sinus congestion and sinus pain, sore throat, dysphagia,  hemoptysis , cough, dyspnea, wheezing, chest pain, palpitations, orthopnea, edema, abdominal pain, nausea, melena, diarrhea, constipation, flank pain, dysuria, hematuria, urinary  Frequency, nocturia, numbness, tingling, seizures,  Focal weakness, Loss of consciousness,  Tremor, insomnia, depression, anxiety, and suicidal ideation.     History   Social History  . Marital Status: Married    Spouse Name: N/A    Number of Children: N/A  . Years of Education: N/A   Occupational History  . Not on file.   Social History Main Topics  . Smoking status: Never Smoker   . Smokeless tobacco: Never Used  . Alcohol Use: Yes  . Drug Use: No  . Sexual Activity: Not Currently   Other Topics Concern  . Not on file   Social History Narrative    Objective:  Filed Vitals:   12/10/13 1556  BP: 160/90  Pulse: 63  Temp: 97.7 F (36.5 C)  Resp:  16     General appearance: alert, cooperative and appears stated age Neck: no adenopathy, no carotid bruit, supple, symmetrical, trachea midline and thyroid not enlarged, symmetric, no tenderness/mass/nodules Back: symmetric, no curvature. ROM normal. No CVA tenderness. Lungs: clear to auscultation bilaterally Heart: regular rate and rhythm, S1, S2 normal, no murmur, click, rub or gallop Abdomen: soft, non-tender; bowel sounds normal; no masses,  no organomegaly Pulses: 2+ and symmetric Skin: Skin color, texture, turgor normal. No rashes or lesions Lymph nodes: Cervical, supraclavicular, and axillary nodes normal. Neuro: mild aphasia, speech mostly  fluent,  No ataxia  Assessment and Plan:  CVA (cerebral infarction) Nonhemorrhagic,  Left MCA with Broca's area affected causing expressive aphasia affecting speech and writing ability,  Mild ataxia.  All are improving.  Continue PT.  Resume BP meds,  Continue Zocor, plavix and ASA,  Rationale for statin discussed with patient per his concerns.   GERD (gastroesophageal reflux disease) Omeprazole changed to pantoprazole due to use of Plavix,   Essential hypertension, benign Resuming hctz and amlodipine today.  Has follow up with cardiology for continue cardiac monitoring next week.   Hyperlipidemia LDL goal <100 Patient now taking zocor for secondary prevention due to recent nonhemorrhagic left MCA territory CVA.  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 aspirin 81 MG tablet Take 81 mg by mouth daily.  . clopidogrel (PLAVIX) 75 MG tablet Take 1 tablet (75 mg total) by mouth daily.  Marland Kitchen doxazosin (CARDURA) 8 MG tablet TAKE 1 TABLET BY MOUTH EVERY DAY  . EPIPEN 2-PAK 0.3 MG/0.3ML SOAJ injection Inject 2 mLs into the muscle as needed.  . latanoprost (XALATAN) 0.005 % ophthalmic solution Place 1 drop into both eyes at bedtime.  . Multiple Vitamins-Minerals (PRESERVISION AREDS 2 PO) Take 1 tablet by mouth daily.  . pindolol (VISKEN) 10 MG tablet Take 1 tablet (10 mg total) by mouth daily.  . simvastatin (ZOCOR) 20 MG tablet Take 1 tablet (20 mg total) by mouth daily.  . [DISCONTINUED] clopidogrel (PLAVIX) 75 MG tablet Take 75 mg by mouth daily.  . [DISCONTINUED] simvastatin (ZOCOR) 20 MG tablet Take 20 mg by mouth daily.  Marland Kitchen amLODipine (NORVASC) 5 MG tablet Take 1 tablet (5 mg total) by mouth daily.  . hydrochlorothiazide (HYDRODIURIL) 25 MG tablet Take 1 tablet (25 mg total) by mouth daily.  . naproxen sodium (ANAPROX) 220 MG tablet Take 220 mg by mouth daily as needed.   .  pantoprazole (PROTONIX) 40 MG tablet Take 1 tablet (40 mg total) by mouth daily.  Marland Kitchen zoster vaccine live, PF, (ZOSTAVAX) 10626 UNT/0.65ML injection Inject 19,400 Units into the skin once.  . [DISCONTINUED] amLODipine (NORVASC) 5 MG tablet Take 5 mg by mouth daily.   . [DISCONTINUED] omeprazole (PRILOSEC) 20 MG capsule Take 20 mg by mouth daily.      Orders Placed This Encounter  Procedures  . Pneumococcal conjugate vaccine 13-valent  . Lipid panel  . Vit D  25 hydroxy (rtn osteoporosis monitoring)  . Comprehensive metabolic panel  . CBC with Differential  . CBC and differential  . Basic metabolic panel  . Lipid panel  . Hepatic function panel  . Hemoglobin A1c  . TSH  . Comprehensive metabolic panel  . Lipid panel    No Follow-up on file.

## 2013-12-12 DIAGNOSIS — E785 Hyperlipidemia, unspecified: Secondary | ICD-10-CM | POA: Insufficient documentation

## 2013-12-12 NOTE — Assessment & Plan Note (Signed)
Resuming hctz and amlodipine today.  Has follow up with cardiology for continue cardiac monitoring next week.

## 2013-12-12 NOTE — Assessment & Plan Note (Signed)
Nonhemorrhagic,  Left MCA with Broca's area affected causing expressive aphasia affecting speech and writing ability,  Mild ataxia.  All are improving.  Continue PT.  Resume BP meds,  Continue Zocor, plavix and ASA,  Rationale for statin discussed with patient per his concerns.

## 2013-12-12 NOTE — Assessment & Plan Note (Addendum)
Omeprazole changed to pantoprazole due to use of Plavix,

## 2013-12-12 NOTE — Assessment & Plan Note (Signed)
Patient now taking zocor for secondary prevention due to recent nonhemorrhagic left MCA territory CVA.  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

## 2013-12-13 ENCOUNTER — Ambulatory Visit (INDEPENDENT_AMBULATORY_CARE_PROVIDER_SITE_OTHER): Payer: Medicare Other | Admitting: Cardiovascular Disease

## 2013-12-13 ENCOUNTER — Encounter: Payer: Self-pay | Admitting: Cardiovascular Disease

## 2013-12-13 VITALS — BP 104/78 | HR 79 | Ht 70.5 in | Wt 194.2 lb

## 2013-12-13 DIAGNOSIS — I10 Essential (primary) hypertension: Secondary | ICD-10-CM

## 2013-12-13 DIAGNOSIS — I4891 Unspecified atrial fibrillation: Secondary | ICD-10-CM | POA: Diagnosis not present

## 2013-12-13 DIAGNOSIS — I639 Cerebral infarction, unspecified: Secondary | ICD-10-CM | POA: Diagnosis not present

## 2013-12-13 MED ORDER — APIXABAN 5 MG PO TABS: 5.0000 mg | ORAL_TABLET | Freq: Two times a day (BID) | ORAL | Status: DC

## 2013-12-13 NOTE — Patient Instructions (Signed)
Your physician has recommended you make the following change in your medication:  Stop Aspirin  Stop Plavix  Start Eliquis 5 mg twice daily   Your physician recommends that you schedule a follow-up appointment in:  3 months with Dr. Fletcher Anon

## 2013-12-13 NOTE — Progress Notes (Signed)
Primary care physician: Dr. Derrel Nip  HPI  Todd Golden is a 80 y.o. male who was referred from [facility] for evaluation of cardiac source of embolism. Patient wa admitted to [facility] on Nov 13 th with neurologic changes including an expressive aphasia and mild ataxia, and was determined to have suffered a Nonhemorrhagic leftmiddle cerebral artery Stroke.Carotid Doppler shows no obstructive disease. Echocardiogram showed normal LV systolic function with mildly dilated left atrium. He was discharged on plavix and ASA. A Holter monitor was done after discharge which showed PVCs with short runs of SVT.  He has been doing reasonably well and denies any chest pain, shortness of breath or palpitations. The stroke was presumed to be embolic but no source was identified.   No Known Allergies   Current Outpatient Prescriptions on File Prior to Visit  Medication Sig Dispense Refill  . amLODipine (NORVASC) 5 MG tablet Take 1 tablet (5 mg total) by mouth daily. 90 tablet 1  . doxazosin (CARDURA) 8 MG tablet TAKE 1 TABLET BY MOUTH EVERY DAY 90 tablet 1  . EPIPEN 2-PAK 0.3 MG/0.3ML SOAJ injection Inject 2 mLs into the muscle as needed.    . hydrochlorothiazide (HYDRODIURIL) 25 MG tablet Take 1 tablet (25 mg total) by mouth daily. 90 tablet 1  . latanoprost (XALATAN) 0.005 % ophthalmic solution Place 1 drop into both eyes at bedtime.    . Multiple Vitamins-Minerals (PRESERVISION AREDS 2 PO) Take 1 tablet by mouth daily.    . naproxen sodium (ANAPROX) 220 MG tablet Take 220 mg by mouth daily as needed.     . pantoprazole (PROTONIX) 40 MG tablet Take 1 tablet (40 mg total) by mouth daily. 90 tablet 1  . pindolol (VISKEN) 10 MG tablet Take 1 tablet (10 mg total) by mouth daily. 90 tablet 1  . simvastatin (ZOCOR) 20 MG tablet Take 1 tablet (20 mg total) by mouth daily. 90 tablet 1  . zoster vaccine live, PF, (ZOSTAVAX) 19379 UNT/0.65ML injection Inject 19,400 Units into the skin once. 1 each 0   No current  facility-administered medications on file prior to visit.     Past Medical History  Diagnosis Date  . Asthma   . Arthritis   . Glaucoma   . Allergy   . Hypertension   . Colon polyps   . Chronic kidney disease     stent  . Stroke   . Hyperlipidemia   . Chronic constipation   . Cough   . Cataract   . Ureteral stricture   . GERD (gastroesophageal reflux disease)      Past Surgical History  Procedure Laterality Date  . Joint replacement Left 2014    knee partial replacement  . Kidney stent Right 1995     Family History  Problem Relation Age of Onset  . Arthritis Mother   . Cancer Mother 89    kidney  . Stroke Father      History   Social History  . Marital Status: Married    Spouse Name: N/A    Number of Children: N/A  . Years of Education: N/A   Occupational History  . Not on file.   Social History Main Topics  . Smoking status: Never Smoker   . Smokeless tobacco: Never Used  . Alcohol Use: Yes  . Drug Use: No  . Sexual Activity: Not Currently   Other Topics Concern  . Not on file   Social History Narrative     ROS A 10 point review  of system was performed. It is negative other than that mentioned in the history of present illness.   PHYSICAL EXAM   BP 104/78 mmHg  Pulse 79  Ht 5' 10.5" (1.791 m)  Wt 194 lb 4 oz (88.111 kg)  BMI 27.47 kg/m2 Constitutional: He is oriented to person, place, and time. He appears well-developed and well-nourished. No distress.  HENT: No nasal discharge.  Head: Normocephalic and atraumatic.  Eyes: Pupils are equal and round.  No discharge. Neck: Normal range of motion. Neck supple. No JVD present. No thyromegaly present.  Cardiovascular: Normal rate, irregular rhythm, normal heart sounds. Exam reveals no gallop and no friction rub. No murmur heard.  Pulmonary/Chest: Effort normal and breath sounds normal. No stridor. No respiratory distress. He has no wheezes. He has no rales. He exhibits no tenderness.    Abdominal: Soft. Bowel sounds are normal. He exhibits no distension. There is no tenderness. There is no rebound and no guarding.  Musculoskeletal: Normal range of motion. He exhibits no edema and no tenderness.  Neurological: He is alert and oriented to person, place, and time. Coordination normal.  Skin: Skin is warm and dry. No rash noted. He is not diaphoretic. No erythema. No pallor.  Psychiatric: He has a normal mood and affect. His behavior is normal. Judgment and thought content normal.       EKG: Atrial fibrillation with controlled ventricular rate   ASSESSMENT AND PLAN

## 2013-12-14 DIAGNOSIS — I480 Paroxysmal atrial fibrillation: Secondary | ICD-10-CM | POA: Insufficient documentation

## 2013-12-14 DIAGNOSIS — I4891 Unspecified atrial fibrillation: Secondary | ICD-10-CM

## 2013-12-14 NOTE — Assessment & Plan Note (Signed)
The patient is to be in atrial fibrillation today with controlled ventricular rate. This is likely the culprit for his recent embolic stroke. Thus, I recommend long-term anticoagulation. I discussed different options with him including warfarin vs. NOACs. I started him on Eliquis 5 mg twice daily. I discontinued aspirin and Plavix.

## 2013-12-14 NOTE — Assessment & Plan Note (Signed)
Blood pressure is controlled on current medications. 

## 2013-12-15 DIAGNOSIS — I639 Cerebral infarction, unspecified: Secondary | ICD-10-CM | POA: Diagnosis not present

## 2013-12-15 DIAGNOSIS — I69320 Aphasia following cerebral infarction: Secondary | ICD-10-CM | POA: Diagnosis not present

## 2013-12-19 ENCOUNTER — Other Ambulatory Visit: Payer: Self-pay | Admitting: Internal Medicine

## 2013-12-21 ENCOUNTER — Encounter: Payer: Self-pay | Admitting: Internal Medicine

## 2013-12-21 DIAGNOSIS — I69320 Aphasia following cerebral infarction: Secondary | ICD-10-CM | POA: Diagnosis not present

## 2013-12-21 DIAGNOSIS — J3089 Other allergic rhinitis: Secondary | ICD-10-CM | POA: Diagnosis not present

## 2013-12-21 DIAGNOSIS — J301 Allergic rhinitis due to pollen: Secondary | ICD-10-CM | POA: Diagnosis not present

## 2013-12-28 DIAGNOSIS — J301 Allergic rhinitis due to pollen: Secondary | ICD-10-CM | POA: Diagnosis not present

## 2013-12-28 DIAGNOSIS — J3089 Other allergic rhinitis: Secondary | ICD-10-CM | POA: Diagnosis not present

## 2014-01-04 DIAGNOSIS — J3089 Other allergic rhinitis: Secondary | ICD-10-CM | POA: Diagnosis not present

## 2014-01-04 DIAGNOSIS — J301 Allergic rhinitis due to pollen: Secondary | ICD-10-CM | POA: Diagnosis not present

## 2014-01-11 DIAGNOSIS — J301 Allergic rhinitis due to pollen: Secondary | ICD-10-CM | POA: Diagnosis not present

## 2014-01-11 DIAGNOSIS — J3089 Other allergic rhinitis: Secondary | ICD-10-CM | POA: Diagnosis not present

## 2014-01-18 DIAGNOSIS — J301 Allergic rhinitis due to pollen: Secondary | ICD-10-CM | POA: Diagnosis not present

## 2014-01-18 DIAGNOSIS — J3089 Other allergic rhinitis: Secondary | ICD-10-CM | POA: Diagnosis not present

## 2014-01-19 ENCOUNTER — Other Ambulatory Visit (INDEPENDENT_AMBULATORY_CARE_PROVIDER_SITE_OTHER): Payer: Medicare Other

## 2014-01-19 DIAGNOSIS — E559 Vitamin D deficiency, unspecified: Secondary | ICD-10-CM

## 2014-01-19 DIAGNOSIS — I63312 Cerebral infarction due to thrombosis of left middle cerebral artery: Secondary | ICD-10-CM | POA: Diagnosis not present

## 2014-01-19 DIAGNOSIS — Z79899 Other long term (current) drug therapy: Secondary | ICD-10-CM | POA: Diagnosis not present

## 2014-01-19 LAB — LIPID PANEL
Cholesterol: 145 mg/dL (ref 0–200)
HDL: 47.3 mg/dL (ref >39.00)
LDL Cholesterol: 79 mg/dL (ref 0–99)
NonHDL: 97.7
Total CHOL/HDL Ratio: 3
Triglycerides: 96 mg/dL (ref 0.0–149.0)
VLDL: 19.2 mg/dL (ref 0.0–40.0)

## 2014-01-19 LAB — CBC WITH DIFFERENTIAL/PLATELET
Basophils Absolute: 0 10*3/uL (ref 0.0–0.1)
Basophils Relative: 0.4 % (ref 0.0–3.0)
Eosinophils Absolute: 0.2 10*3/uL (ref 0.0–0.7)
Eosinophils Relative: 3.4 % (ref 0.0–5.0)
HCT: 39.4 % (ref 39.0–52.0)
Hemoglobin: 12.9 g/dL — ABNORMAL LOW (ref 13.0–17.0)
Lymphocytes Relative: 34.2 % (ref 12.0–46.0)
Lymphs Abs: 1.8 10*3/uL (ref 0.7–4.0)
MCHC: 32.8 g/dL (ref 30.0–36.0)
MCV: 92.4 fl (ref 78.0–100.0)
Monocytes Absolute: 0.7 10*3/uL (ref 0.1–1.0)
Monocytes Relative: 12.6 % — ABNORMAL HIGH (ref 3.0–12.0)
Neutro Abs: 2.7 10*3/uL (ref 1.4–7.7)
Neutrophils Relative %: 49.4 % (ref 43.0–77.0)
Platelets: 212 10*3/uL (ref 150.0–400.0)
RBC: 4.26 Mil/uL (ref 4.22–5.81)
RDW: 13.1 % (ref 11.5–15.5)
WBC: 5.4 10*3/uL (ref 4.0–10.5)

## 2014-01-19 LAB — COMPREHENSIVE METABOLIC PANEL
ALT: 25 U/L (ref 0–53)
AST: 31 U/L (ref 0–37)
Albumin: 4.1 g/dL (ref 3.5–5.2)
Alkaline Phosphatase: 48 U/L (ref 39–117)
BUN: 24 mg/dL — ABNORMAL HIGH (ref 6–23)
CO2: 28 mEq/L (ref 19–32)
Calcium: 9.1 mg/dL (ref 8.4–10.5)
Chloride: 104 mEq/L (ref 96–112)
Creatinine, Ser: 1.3 mg/dL (ref 0.4–1.5)
GFR: 54.78 mL/min — ABNORMAL LOW (ref >60.00)
Glucose, Bld: 100 mg/dL — ABNORMAL HIGH (ref 70–99)
Potassium: 3.5 mEq/L (ref 3.5–5.1)
Sodium: 140 mEq/L (ref 135–145)
Total Bilirubin: 0.9 mg/dL (ref 0.2–1.2)
Total Protein: 7.1 g/dL (ref 6.0–8.3)

## 2014-01-19 LAB — VITAMIN D 25 HYDROXY (VIT D DEFICIENCY, FRACTURES): VITD: 30.47 ng/mL (ref 30.00–100.00)

## 2014-01-20 ENCOUNTER — Encounter: Payer: Self-pay | Admitting: Internal Medicine

## 2014-01-24 ENCOUNTER — Ambulatory Visit (INDEPENDENT_AMBULATORY_CARE_PROVIDER_SITE_OTHER): Payer: Medicare Other | Admitting: Internal Medicine

## 2014-01-24 ENCOUNTER — Encounter: Payer: Self-pay | Admitting: Internal Medicine

## 2014-01-24 VITALS — BP 108/66 | HR 75 | Temp 97.6°F | Resp 16 | Ht 70.0 in | Wt 198.2 lb

## 2014-01-24 DIAGNOSIS — Z860100 Personal history of colon polyps, unspecified: Secondary | ICD-10-CM

## 2014-01-24 DIAGNOSIS — K5909 Other constipation: Secondary | ICD-10-CM

## 2014-01-24 DIAGNOSIS — I63319 Cerebral infarction due to thrombosis of unspecified middle cerebral artery: Secondary | ICD-10-CM | POA: Diagnosis not present

## 2014-01-24 DIAGNOSIS — K5900 Constipation, unspecified: Secondary | ICD-10-CM

## 2014-01-24 DIAGNOSIS — N135 Crossing vessel and stricture of ureter without hydronephrosis: Secondary | ICD-10-CM | POA: Diagnosis not present

## 2014-01-24 DIAGNOSIS — Z0000 Encounter for general adult medical examination without abnormal findings: Secondary | ICD-10-CM | POA: Diagnosis not present

## 2014-01-24 MED ORDER — OMEPRAZOLE 40 MG PO CPDR: 40.0000 mg | DELAYED_RELEASE_CAPSULE | Freq: Every day | ORAL | Status: DC

## 2014-01-24 NOTE — Patient Instructions (Signed)
You had your annual Medicare wellness exam today   Health Maintenance A healthy lifestyle and preventative care can promote health and wellness.  Maintain regular health, dental, and eye exams.  Eat a healthy diet. Foods like vegetables, fruits, whole grains, low-fat dairy products, and lean protein foods contain the nutrients you need and are low in calories. Decrease your intake of foods high in solid fats, added sugars, and salt. Get information about a proper diet from your health care provider, if necessary.  Regular physical exercise is one of the most important things you can do for your health. Most adults should get at least 150 minutes of moderate-intensity exercise (any activity that increases your heart rate and causes you to sweat) each week. In addition, most adults need muscle-strengthening exercises on 2 or more days a week.   Maintain a healthy weight. The body mass index (BMI) is a screening tool to identify possible weight problems. It provides an estimate of body fat based on height and weight. Your health care provider can find your BMI and can help you achieve or maintain a healthy weight. For males 20 years and older:  A BMI below 18.5 is considered underweight.  A BMI of 18.5 to 24.9 is normal.  A BMI of 25 to 29.9 is considered overweight.  A BMI of 30 and above is considered obese.  Maintain normal blood lipids and cholesterol by exercising and minimizing your intake of saturated fat. Eat a balanced diet with plenty of fruits and vegetables. Blood tests for lipids and cholesterol should begin at age 52 and be repeated every 5 years. If your lipid or cholesterol levels are high, you are over age 48, or you are at high risk for heart disease, you may need your cholesterol levels checked more frequently.Ongoing high lipid and cholesterol levels should be treated with medicines if diet and exercise are not working.  If you smoke, find out from your health care provider  how to quit. If you do not use tobacco, do not start.  Lung cancer screening is recommended for adults aged 32-80 years who are at high risk for developing lung cancer because of a history of smoking. A yearly low-dose CT scan of the lungs is recommended for people who have at least a 30-pack-year history of smoking and are current smokers or have quit within the past 15 years. A pack year of smoking is smoking an average of 1 pack of cigarettes a day for 1 year (for example, a 30-pack-year history of smoking could mean smoking 1 pack a day for 30 years or 2 packs a day for 15 years). Yearly screening should continue until the smoker has stopped smoking for at least 15 years. Yearly screening should be stopped for people who develop a health problem that would prevent them from having lung cancer treatment.  If you choose to drink alcohol, do not have more than 2 drinks per day. One drink is considered to be 12 oz (360 mL) of beer, 5 oz (150 mL) of wine, or 1.5 oz (45 mL) of liquor.  Avoid the use of street drugs. Do not share needles with anyone. Ask for help if you need support or instructions about stopping the use of drugs.  High blood pressure causes heart disease and increases the risk of stroke. Blood pressure should be checked at least every 1-2 years. Ongoing high blood pressure should be treated with medicines if weight loss and exercise are not effective.  If you  are 21-6 years old, ask your health care provider if you should take aspirin to prevent heart disease.  Diabetes screening involves taking a blood sample to check your fasting blood sugar level. This should be done once every 3 years after age 36 if you are at a normal weight and without risk factors for diabetes. Testing should be considered at a younger age or be carried out more frequently if you are overweight and have at least 1 risk factor for diabetes.  Colorectal cancer can be detected and often prevented. Most routine  colorectal cancer screening begins at the age of 19 and continues through age 37. However, your health care provider may recommend screening at an earlier age if you have risk factors for colon cancer. On a yearly basis, your health care provider may provide home test kits to check for hidden blood in the stool. A small camera at the end of a tube may be used to directly examine the colon (sigmoidoscopy or colonoscopy) to detect the earliest forms of colorectal cancer. Talk to your health care provider about this at age 52 when routine screening begins. A direct exam of the colon should be repeated every 5-10 years through age 28, unless early forms of precancerous polyps or small growths are found.  People who are at an increased risk for hepatitis B should be screened for this virus. You are considered at high risk for hepatitis B if:  You were born in a country where hepatitis B occurs often. Talk with your health care provider about which countries are considered high risk.  Your parents were born in a high-risk country and you have not received a shot to protect against hepatitis B (hepatitis B vaccine).  You have HIV or AIDS.  You use needles to inject street drugs.  You live with, or have sex with, someone who has hepatitis B.  You are a man who has sex with other men (MSM).  You get hemodialysis treatment.  You take certain medicines for conditions like cancer, organ transplantation, and autoimmune conditions.  Hepatitis C blood testing is recommended for all people born from [birth year] through 1963 and any individual with known risk factors for hepatitis C.  Healthy men should no longer receive prostate-specific antigen (PSA) blood tests as part of routine cancer screening. Talk to your health care provider about prostate cancer screening.  Testicular cancer screening is not recommended for adolescents or adult males who have no symptoms. Screening includes self-exam, a health care  provider exam, and other screening tests. Consult with your health care provider about any symptoms you have or any concerns you have about testicular cancer.  Practice safe sex. Use condoms and avoid high-risk sexual practices to reduce the spread of sexually transmitted infections (STIs).  You should be screened for STIs, including gonorrhea and chlamydia if:  You are sexually active and are younger than 24 years.  You are older than 24 years, and your health care provider tells you that you are at risk for this type of infection.  Your sexual activity has changed since you were last screened, and you are at an increased risk for chlamydia or gonorrhea. Ask your health care provider if you are at risk.  If you are at risk of being infected with HIV, it is recommended that you take a prescription medicine daily to prevent HIV infection. This is called pre-exposure prophylaxis (PrEP). You are considered at risk if:  You are a man who has sex with  other men (MSM).  You are a heterosexual man who is sexually active with multiple partners.  You take drugs by injection.  You are sexually active with a partner who has HIV.  Talk with your health care provider about whether you are at high risk of being infected with HIV. If you choose to begin PrEP, you should first be tested for HIV. You should then be tested every 3 months for as long as you are taking PrEP.  Use sunscreen. Apply sunscreen liberally and repeatedly throughout the day. You should seek shade when your shadow is shorter than you. Protect yourself by wearing long sleeves, pants, a wide-brimmed hat, and sunglasses year round whenever you are outdoors.  Tell your health care provider of new moles or changes in moles, especially if there is a change in shape or color. Also, tell your health care provider if a mole is larger than the size of a pencil eraser.  A one-time screening for abdominal aortic aneurysm (AAA) and surgical  repair of large AAAs by ultrasound is recommended for men aged 54-75 years who are current or former smokers.  Stay current with your vaccines (immunizations). Document Released: 07/06/2007 Document Revised: 01/12/2013 Document Reviewed: 06/04/2010 [facility] Patient Information 2015 [place], [place]. This information is not intended to replace advice given to you by your health care provider. Make sure you discuss any questions you have with your health care provider.

## 2014-01-24 NOTE — Assessment & Plan Note (Addendum)
Managed with daily fiber supplement.  No change in pattern,  No reports of hematochezia.  Recommended an increase in fiber intake to 25 to 35 grams daily.  Made several dietary suggestions of high fiber content including Mission low carb whole wheat tortillas which have 26 g fiber/serving, Toufayan flatbread which has around 10 g fiber,   Daily serving of any type of nuts,  and Atkins protein bars which have 8 to 10 g fiber.   Dispelled the popularly held notion that [employer] oatmeal, whole grain bread and most commericially made cereals have adequate fiber.  Continue daily use of Miralax., metamucil, citrucel, benefiber  or fibercon.

## 2014-01-24 NOTE — Progress Notes (Signed)
Patient ID: Todd Golden, male   DOB: 1930-04-13, 81 y.o.   MRN: [id number]  The patient is here for annual Medicare wellness examinatpaion and for follow up and management of other chronic and acute problems.  1)He has been increased signs and symptoms of acid reflux since his PP was changed from omeprazole to  protonix,  Prefers to return to omeprazole   2) He has frequent episodes of constipation that he manages with OTC bulk forming laxatves and occasional MOM. He denies hematochezia   3) He reports infrequent  RLQ crampy pain which he has attributed to his constipation since he notes some change to his stooling pattern when he has the pain.  It is aggravated by decreased water intake and resolves spontaneously. Occurs about once a month. However he also thinks it may be related to a chronic  ureteral "kink" he was told he had many years ago.  He has a history of ureteral stent 25 years ago placed by a  [place] urologist and  removed after one month. No urinary issues, including incontinence or hesitancy.  Nocturnal voids average once or twice per night    4) Post CVA aphasia:  He has been released by Speech therapy and notes continued difficulty with pronounging certain words and spelling some words.  He denies difficulty with memory or calculation/executive tasks.    The risk factors are reflected in the social history.  The roster of all physicians providing medical care to patient - is listed in the Snapshot section of the chart.  Activities of daily living:  The patient is 100% independent in all ADLs: dressing, toileting, feeding as well as independent mobility  Home safety : The patient has smoke detectors in the home. They wear seatbelts.  There are no firearms at home. There is no violence in the home.   There is no risks for hepatitis, STDs or HIV. There is no   history of blood transfusion. They have no travel history to infectious disease endemic areas of the world.  The patient  has seen his dentist in the last six month. They have seen their eye doctor in the last year ,  Sees him twice annually, borderline Macular degeneration and glaucoma, pre. . They admit to slight hearing difficulty with regard to whispered voices and some television programs and wears a hearing aid .  They do not  have excessive sun exposure. Discussed the need for sun protection: hats, long sleeves and use of sunscreen if there is significant sun exposure.   Diet: the importance of a healthy diet is discussed. They do have a healthy diet.  The benefits of regular aerobic exercise were discussed. he walks 4 times per week , for approximately  20 minutes.   Depression screen: there are no signs or vegative symptoms of depression- irritability, change in appetite, anhedonia, sadness/tearfullness.  Cognitive assessment: the patient manages all their financial and personal affairs and is actively engaged. They could relate day,date,year and events; recalled 2/3 objects at 3 minutes; performed clock-face test normally.  The following portions of the patient's history were reviewed and updated as appropriate: allergies, current medications, past family history, past medical history,  past surgical history, past social history  and problem list.  Visual acuity was not assessed per patient preference since she has regular follow up with her ophthalmologist. Hearing and body mass index were assessed and reviewed.   During the course of the visit the patient was educated and counseled about appropriate  screening and preventive services including : fall prevention , diabetes screening, nutrition counseling, colorectal cancer screening, and recommended immunizations.    Objective: BP 108/66 mmHg  Pulse 75  Temp(Src) 97.6 F (36.4 C) (Oral)  Resp 16  Ht 5\' 10"  (1.778 m)  Wt 198 lb 4 oz (89.926 kg)  BMI 28.45 kg/m2  SpO2 98%  General Appearance:    Alert, cooperative, no distress, appears stated age   Head:    Normocephalic, without obvious abnormality, atraumatic  Eyes:    PERRL, conjunctiva/corneas clear, EOM's intact, fundi    benign, both eyes       Ears:    Normal TM's and external ear canals, both ears  Nose:   Nares normal, septum midline, mucosa normal, no drainage   or sinus tenderness  Throat:   Lips, mucosa, and tongue normal; teeth and gums normal  Neck:   Supple, symmetrical, trachea midline, no adenopathy;       thyroid:  No enlargement/tenderness/nodules; no carotid   bruit or JVD  Back:     Symmetric, no curvature, ROM normal, no CVA tenderness  Lungs:     Clear to auscultation bilaterally, respirations unlabored  Chest wall:    No tenderness or deformity  Heart:    Regular rate and rhythm, S1 and S2 normal, no murmur, rub   or gallop  Abdomen:     Soft, non-tender, bowel sounds active all four quadrants,    no masses, no organomegaly  Genitalia:    Normal male without lesion, discharge or tenderness  Rectal:    Deferred given age    Extremities:   Extremities normal, atraumatic, no cyanosis or edema  Pulses:   2+ and symmetric all extremities  Skin:   Skin color, texture, turgor normal, no rashes or lesions  Lymph nodes:   Cervical, supraclavicular, and axillary nodes normal  Neurologic:   CNII-XII intact. Normal strength, sensation and reflexes      throughout   Assessment and Plan:  Problem List Items Addressed This Visit    Chronic constipation - Primary    Managed with daily fiber supplement.  No change in pattern,  No reports of hematochezia.  Recommended an increase in fiber intake to 25 to 35 grams daily.  Made several dietary suggestions of high fiber content including [place] low carb whole wheat tortillas which have 26 g fiber/serving, Toufayan flatbread which has around 10 g fiber,   Daily serving of any type of nuts,  and Atkins protein bars which have 8 to 10 g fiber.   Dispelled the popularly held notion that [employer] oatmeal, whole grain bread and  most commericially made cereals have adequate fiber.  Continue daily use of Miralax., metamucil, citrucel, benefiber  or fibercon.     CVA (cerebral infarction)    Nonhemorrhagic,  Left MCA with Broca's aphasia and mild ataxia at presentation which has nearly resolved.  Continue Zocor, plavix and ASA,  Rationale for statin reviewed with patient per his concerns.       History of colonic polyps    Large polyp 37 cm from recturm per pateint,  Too difficult to remove at the time .  Has chronic constipation n managed with daily fiber and prn dulcolax no more than twice weekly     Medicare annual wellness visit, subsequent    Annual Medicare wellness  exam was done as well as a comprehensive physical exam and management of acute and chronic conditions .  During the course of the  visit the patient was educated and counseled about appropriate screening and preventive services including : fall prevention , diabetes screening, nutrition counseling, colorectal cancer screening, and recommended immunizations.  Printed recommendations for health maintenance screenings was given.     Ureteral stricture, right    With chronic right hydronephrosis by prior imaging,  Infrequent  episodes of flank pain described,  Aggravated by large meals and decreased hydration,  Relieved by drinking cranberry juice and relaxing

## 2014-01-24 NOTE — Assessment & Plan Note (Signed)
Large polyp 37 cm from recturm per pateint,  Too difficult to remove at the time .  Has chronic constipation n managed with daily fiber and prn dulcolax no more than twice weekly

## 2014-01-24 NOTE — Progress Notes (Signed)
Pre-visit discussion using our clinic review tool. No additional management support is needed unless otherwise documented below in the visit note.  

## 2014-01-25 ENCOUNTER — Encounter: Payer: Self-pay | Admitting: Internal Medicine

## 2014-01-25 DIAGNOSIS — J3089 Other allergic rhinitis: Secondary | ICD-10-CM | POA: Diagnosis not present

## 2014-01-25 DIAGNOSIS — J301 Allergic rhinitis due to pollen: Secondary | ICD-10-CM | POA: Diagnosis not present

## 2014-01-25 NOTE — Assessment & Plan Note (Signed)
Nonhemorrhagic,  Left MCA with Broca's aphasia and mild ataxia at presentation which has nearly resolved.  Continue Zocor, plavix and ASA,  Rationale for statin reviewed with patient per his concerns.

## 2014-01-25 NOTE — Assessment & Plan Note (Signed)

## 2014-01-25 NOTE — Assessment & Plan Note (Signed)
With chronic right hydronephrosis by prior imaging,  Infrequent  episodes of flank pain described,  Aggravated by large meals and decreased hydration,  Relieved by drinking cranberry juice and relaxing

## 2014-02-01 DIAGNOSIS — J3089 Other allergic rhinitis: Secondary | ICD-10-CM | POA: Diagnosis not present

## 2014-02-01 DIAGNOSIS — J301 Allergic rhinitis due to pollen: Secondary | ICD-10-CM | POA: Diagnosis not present

## 2014-02-02 DIAGNOSIS — H40053 Ocular hypertension, bilateral: Secondary | ICD-10-CM | POA: Diagnosis not present

## 2014-02-08 DIAGNOSIS — J301 Allergic rhinitis due to pollen: Secondary | ICD-10-CM | POA: Diagnosis not present

## 2014-02-08 DIAGNOSIS — J3089 Other allergic rhinitis: Secondary | ICD-10-CM | POA: Diagnosis not present

## 2014-02-15 DIAGNOSIS — J3089 Other allergic rhinitis: Secondary | ICD-10-CM | POA: Diagnosis not present

## 2014-02-15 DIAGNOSIS — J301 Allergic rhinitis due to pollen: Secondary | ICD-10-CM | POA: Diagnosis not present

## 2014-02-22 DIAGNOSIS — J301 Allergic rhinitis due to pollen: Secondary | ICD-10-CM | POA: Diagnosis not present

## 2014-02-22 DIAGNOSIS — J3089 Other allergic rhinitis: Secondary | ICD-10-CM | POA: Diagnosis not present

## 2014-03-01 DIAGNOSIS — J3089 Other allergic rhinitis: Secondary | ICD-10-CM | POA: Diagnosis not present

## 2014-03-01 DIAGNOSIS — J301 Allergic rhinitis due to pollen: Secondary | ICD-10-CM | POA: Diagnosis not present

## 2014-03-08 DIAGNOSIS — J301 Allergic rhinitis due to pollen: Secondary | ICD-10-CM | POA: Diagnosis not present

## 2014-03-08 DIAGNOSIS — J3089 Other allergic rhinitis: Secondary | ICD-10-CM | POA: Diagnosis not present

## 2014-03-15 DIAGNOSIS — J3089 Other allergic rhinitis: Secondary | ICD-10-CM | POA: Diagnosis not present

## 2014-03-15 DIAGNOSIS — J301 Allergic rhinitis due to pollen: Secondary | ICD-10-CM | POA: Diagnosis not present

## 2014-03-18 ENCOUNTER — Ambulatory Visit (INDEPENDENT_AMBULATORY_CARE_PROVIDER_SITE_OTHER): Payer: Medicare Other | Admitting: Cardiovascular Disease

## 2014-03-18 ENCOUNTER — Encounter: Payer: Self-pay | Admitting: Cardiovascular Disease

## 2014-03-18 VITALS — BP 112/62 | HR 64 | Ht 70.0 in | Wt 200.8 lb

## 2014-03-18 DIAGNOSIS — I4891 Unspecified atrial fibrillation: Secondary | ICD-10-CM

## 2014-03-18 DIAGNOSIS — I10 Essential (primary) hypertension: Secondary | ICD-10-CM | POA: Diagnosis not present

## 2014-03-18 DIAGNOSIS — I63319 Cerebral infarction due to thrombosis of unspecified middle cerebral artery: Secondary | ICD-10-CM | POA: Diagnosis not present

## 2014-03-18 NOTE — Progress Notes (Signed)
Primary care physician: Dr. Derrel Nip  HPI  Todd Golden is a 81 y.o. male who is here today for a follow-up visit regarding paroxysmal atrial fibrillation. He was admitted at [facility] in November 2015 with expressive aphasia and mild ataxia, and was determined to have suffered a Nonhemorrhagic leftmiddle cerebral artery Stroke.Carotid Doppler shows no obstructive disease. Echocardiogram showed normal LV systolic function with mildly dilated left atrium. He was discharged on plavix and ASA. A Holter monitor was done after discharge which showed PVCs with short runs of SVT.  He was noted during his initial evaluation to be in atrial fibrillation. I started him on Eliquis for anticoagulation. He has been doing very well and denies any chest pain, shortness of breath or palpitations.   No Known Allergies   Current Outpatient Prescriptions on File Prior to Visit  Medication Sig Dispense Refill  . amLODipine (NORVASC) 5 MG tablet Take 1 tablet (5 mg total) by mouth daily. 90 tablet 1  . apixaban (ELIQUIS) 5 MG TABS tablet Take 1 tablet (5 mg total) by mouth 2 (two) times daily. 60 tablet 6  . doxazosin (CARDURA) 8 MG tablet TAKE 1 TABLET BY MOUTH EVERY DAY 90 tablet 1  . EPIPEN 2-PAK 0.3 MG/0.3ML SOAJ injection Inject 2 mLs into the muscle as needed.    . hydrochlorothiazide (HYDRODIURIL) 25 MG tablet Take 1 tablet (25 mg total) by mouth daily. 90 tablet 1  . latanoprost (XALATAN) 0.005 % ophthalmic solution Place 1 drop into both eyes at bedtime.    . Multiple Vitamins-Minerals (PRESERVISION AREDS 2 PO) Take 1 tablet by mouth daily.    . naproxen sodium (ANAPROX) 220 MG tablet Take 220 mg by mouth daily as needed.     Marland Kitchen omeprazole (PRILOSEC) 40 MG capsule Take 1 capsule (40 mg total) by mouth daily. 90 capsule 1  . pindolol (VISKEN) 10 MG tablet Take 1 tablet (10 mg total) by mouth daily. 90 tablet 1  . simvastatin (ZOCOR) 20 MG tablet Take 1 tablet (20 mg total) by mouth daily. 90 tablet 1  .  zoster vaccine live, PF, (ZOSTAVAX) 69450 UNT/0.65ML injection Inject 19,400 Units into the skin once. 1 each 0   No current facility-administered medications on file prior to visit.     Past Medical History  Diagnosis Date  . Asthma   . Arthritis   . Glaucoma   . Allergy   . Hypertension   . Colon polyps   . Chronic kidney disease     stent  . Stroke   . Hyperlipidemia   . Chronic constipation   . Cough   . Cataract   . Ureteral stricture   . GERD (gastroesophageal reflux disease)      Past Surgical History  Procedure Laterality Date  . Joint replacement Left 2014    knee partial replacement  . Kidney stent Right 1995     Family History  Problem Relation Age of Onset  . Arthritis Mother   . Cancer Mother 24    kidney  . Stroke Father      History   Social History  . Marital Status: Married    Spouse Name: N/A  . Number of Children: N/A  . Years of Education: N/A   Occupational History  . Not on file.   Social History Main Topics  . Smoking status: Never Smoker   . Smokeless tobacco: Never Used  . Alcohol Use: Yes  . Drug Use: No  . Sexual Activity: Not Currently  Other Topics Concern  . Not on file   Social History Narrative     ROS A 10 point review of system was performed. It is negative other than that mentioned in the history of present illness.   PHYSICAL EXAM   BP 112/62 mmHg  Pulse 64  Ht 5\' 10"  (1.778 m)  Wt 200 lb 12 oz (91.06 kg)  BMI 28.80 kg/m2 Constitutional: He is oriented to person, place, and time. He appears well-developed and well-nourished. No distress.  HENT: No nasal discharge.  Head: Normocephalic and atraumatic.  Eyes: Pupils are equal and round.  No discharge. Neck: Normal range of motion. Neck supple. No JVD present. No thyromegaly present.  Cardiovascular: Normal rate, irregular rhythm, normal heart sounds. Exam reveals no gallop and no friction rub. No murmur heard.  Pulmonary/Chest: Effort normal and  breath sounds normal. No stridor. No respiratory distress. He has no wheezes. He has no rales. He exhibits no tenderness.  Abdominal: Soft. Bowel sounds are normal. He exhibits no distension. There is no tenderness. There is no rebound and no guarding.  Musculoskeletal: Normal range of motion. He exhibits no edema and no tenderness.  Neurological: He is alert and oriented to person, place, and time. Coordination normal.  Skin: Skin is warm and dry. No rash noted. He is not diaphoretic. No erythema. No pallor.  Psychiatric: He has a normal mood and affect. His behavior is normal. Judgment and thought content normal.       EKG: Sinus  Rhythm  WITHIN NORMAL LIMITS   ASSESSMENT AND PLAN

## 2014-03-18 NOTE — Assessment & Plan Note (Signed)
The patient seems to have paroxysmal atrial fibrillation. He is in normal sinus rhythm today. He is tolerating anticoagulation with Eliquis. Labs in December were unremarkable. He is almost fully recovered from his previous stroke.

## 2014-03-18 NOTE — Assessment & Plan Note (Signed)
Blood pressure is well controlled on current medications. 

## 2014-03-18 NOTE — Patient Instructions (Signed)
Continue same medications.   Your physician wants you to follow-up in: 6 months.  You will receive a reminder letter in the mail two months in advance. If you don't receive a letter, please call our office to schedule the follow-up appointment.  

## 2014-03-22 DIAGNOSIS — J301 Allergic rhinitis due to pollen: Secondary | ICD-10-CM | POA: Diagnosis not present

## 2014-03-22 DIAGNOSIS — J3089 Other allergic rhinitis: Secondary | ICD-10-CM | POA: Diagnosis not present

## 2014-03-29 DIAGNOSIS — J3089 Other allergic rhinitis: Secondary | ICD-10-CM | POA: Diagnosis not present

## 2014-03-29 DIAGNOSIS — J301 Allergic rhinitis due to pollen: Secondary | ICD-10-CM | POA: Diagnosis not present

## 2014-04-05 DIAGNOSIS — J3089 Other allergic rhinitis: Secondary | ICD-10-CM | POA: Diagnosis not present

## 2014-04-05 DIAGNOSIS — J301 Allergic rhinitis due to pollen: Secondary | ICD-10-CM | POA: Diagnosis not present

## 2014-04-06 ENCOUNTER — Other Ambulatory Visit: Payer: Self-pay | Admitting: Internal Medicine

## 2014-04-06 ENCOUNTER — Telehealth: Payer: Self-pay | Admitting: *Deleted

## 2014-04-06 MED ORDER — PINDOLOL 10 MG PO TABS: 10.0000 mg | ORAL_TABLET | Freq: Every day | ORAL | Status: DC

## 2014-04-06 NOTE — Telephone Encounter (Signed)
Fax from pharmacy requesting Pindolol 10 mg tab.  Last refill 12.31.2014.  Last OV 1.4.16.  Please advise refill

## 2014-04-06 NOTE — Telephone Encounter (Signed)
rx sent

## 2014-04-09 ENCOUNTER — Other Ambulatory Visit: Payer: Self-pay | Admitting: Internal Medicine

## 2014-04-12 DIAGNOSIS — J3089 Other allergic rhinitis: Secondary | ICD-10-CM | POA: Diagnosis not present

## 2014-04-12 DIAGNOSIS — J301 Allergic rhinitis due to pollen: Secondary | ICD-10-CM | POA: Diagnosis not present

## 2014-04-19 DIAGNOSIS — J301 Allergic rhinitis due to pollen: Secondary | ICD-10-CM | POA: Diagnosis not present

## 2014-04-19 DIAGNOSIS — J3089 Other allergic rhinitis: Secondary | ICD-10-CM | POA: Diagnosis not present

## 2014-04-26 DIAGNOSIS — J301 Allergic rhinitis due to pollen: Secondary | ICD-10-CM | POA: Diagnosis not present

## 2014-04-26 DIAGNOSIS — J3089 Other allergic rhinitis: Secondary | ICD-10-CM | POA: Diagnosis not present

## 2014-04-27 DIAGNOSIS — J301 Allergic rhinitis due to pollen: Secondary | ICD-10-CM | POA: Diagnosis not present

## 2014-04-27 DIAGNOSIS — J3089 Other allergic rhinitis: Secondary | ICD-10-CM | POA: Diagnosis not present

## 2014-05-03 DIAGNOSIS — J3089 Other allergic rhinitis: Secondary | ICD-10-CM | POA: Diagnosis not present

## 2014-05-03 DIAGNOSIS — J301 Allergic rhinitis due to pollen: Secondary | ICD-10-CM | POA: Diagnosis not present

## 2014-05-10 DIAGNOSIS — J301 Allergic rhinitis due to pollen: Secondary | ICD-10-CM | POA: Diagnosis not present

## 2014-05-10 DIAGNOSIS — J3089 Other allergic rhinitis: Secondary | ICD-10-CM | POA: Diagnosis not present

## 2014-05-14 NOTE — H&P (Signed)
PATIENT NAME:  Todd Golden, Todd Golden MR#:  [id number] DATE OF BIRTH:  1930/12/21  DATE OF ADMISSION:  12/03/2013  REFERRING EMERGENCY ROOM PHYSICIAN: Wells Guiles L. Reita Cliche, MD   PRIMARY CARE PROVIDER: Deborra Medina, MD  CHIEF COMPLAINT: Inability to speak.   HISTORY OF PRESENT ILLNESS: This very pleasant 81 year old man with a past medical history of hypertension, who presents today with acute onset of expressive aphasia. He was in his normal state of health yesterday. This morning, he woke up, got dressed and moved around normally and, when attempting to speak to his wife, they noticed that he was unable to articulate any words. He was also unable to write. He does seem very oriented. He is able to understand and follow commands and move normally. They went to their primary care physician's office and were seen by the nurse and directed to the Emergency Room. On presentation, preliminary workup, including CT scan and laboratories, is normal. He continues to have a profound expressive aphasia, and is being admitted for further evaluation.   PAST MEDICAL HISTORY:  1.  Hypertension.  2.  Allergies.  3.  Benign prosthetic hypertrophy.   PAST SURGICAL HISTORY:  1.  Left knee replacement.  2.  Cataract surgery.   SOCIAL HISTORY: The patient lives with his wife. He does not smoke cigarettes or use illicit substances. He rarely drinks alcohol, less than 1 drink a week. 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 At home, he does not use any assistive equipment.   FAMILY HISTORY: His father had multiple strokes. No family history of coronary artery disease. No family history of colon or breast cancer.   ALLERGIES: No known allergies.   HOME MEDICATIONS:  1.  Pindolol 10 mg 1 tablet once a day.  2.  Omeprazole 20 mg 1 tablet twice a day.  3.  Latanoprost ophthalmic 0.005% solution 1 drop to both eyes once a day at bedtime.  4.  Hydrochlorothiazide 25 mg 1 tablet  once a day.  5.  Doxazosin 8 mg 1 tablet once a day.  6.  Amlodipine 5 mg 1 tablet once a day.  7.  Allergy shots 2 shots once a week on Tuesdays.   REVIEW OF SYSTEMS: Unable to obtain review of systems from the patient due to aphasia. Wife provides review of systems.  GENERAL: No fever, fatigue, weakness, or change in weight.  HEENT: No change in vision or pain in eyes. 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 No difficulty swallowing or throat pain.  RESPIRATORY: No recent cough, chest, shortness of breath, painful respiration. No history of COPD or asthma.  CARDIOVASCULAR: No chest pain, orthopnea, edema or palpitations. The patient does not have a cardiologist.  GASTROINTESTINAL: No nausea, vomiting, diarrhea, abdominal pain, or change in bowel habits. No change in appetite recently.  GENITOURINARY: No dysuria or frequency.  MUSCULOSKELETAL: No weakness, swollen joints, easy bruising or bleeding, or myalgias.  NEUROLOGIC: No dementia, headaches, migraine, seizures or tremor, no focal numbness or weakness. Positive for current expressive aphasia.  PSYCHIATRIC: No history of uncontrolled anxiety or depression.   PHYSICAL EXAMINATION:  VITAL SIGNS: Temperature 98.5, pulse 74, respirations 20, blood pressure 129/79, oxygenation 97% on room air.  GENERAL: No distress.  HEENT: Pupils are equal, round, and reactive to light. Conjunctivae are clear. Extraocular motion is intact. The patient is wearing hearing aids. Oral mucous membranes are pink and moist. He  does have a dental bridge. Fairly good dentition. Posterior oropharynx is clear, without exudate or edema.  NECK: Supple, with no cervical lymphadenopathy. Trachea is midline. Thyroid is nontender.  RESPIRATORY: Lungs are clear to auscultation bilaterally with good air movement. The patient has some difficulty participating fully in the respiratory examination.  CARDIOVASCULAR: Regular rate  and rhythm. No murmurs, rubs, or gallops. No peripheral edema. Peripheral pulses are 2+.  ABDOMEN: Soft, nontender, nondistended, obese. Bowel sounds are normal.  MUSCULOSKELETAL: No warm, swollen or tender joints. Passive range of motion is normal in all joints. Strength testing is 5/5 throughout. He does have a history of foot drop on the left, but and has difficulty with flexion of the left foot.  NEUROLOGIC: Cranial nerves II through XII are grossly intact. Strength and sensation are symmetric. He has a slow response time to physical commands, but does respond quickly to questions with "yes" or "no" answers. He seems oriented. He is unable to speak fluently. Occasional words are not clear. He is unable to write and is writing nonsense words. Cerebellar examination is normal. PSYCHIATRIC: The patient seems to be alert and oriented. He is calm and not overly anxious at this time.   LABORATORY DATA: Sodium 142, potassium is pending, chloride is 105, bicarbonate 29, BUN 21, creatinine 1.19, blood glucose 101. Troponin less than 0.02. White blood cells 5.1, hemoglobin 12.9, MCV 94, platelets 200,000.   IMAGING: CT of the head shows atrophy with extensive small vessel chronic ischemic changes of deep cerebral white matter. There is an old left frontal periventricular white matter infarct. No acute intracranial abnormalities.   ASSESSMENT AND PLAN:  1.  Acute onset of expressive aphasia: I think this is likely due to a cerebrovascular accident. Current CT shows no acute cerebrovascular accident. I will obtain an MRI. Will initiate statin and aspirin therapy. We will have speech therapy see the patient for further evaluation. Will obtain neurology consultation. We will also obtain a 2-D echocardiogram and carotid Dopplers. Admit to telemetry.   2.  Hypertension: We will go ahead and give pindolol and doxazosin. Hold hydrochlorothiazide and Norvasc. At this time, blood pressures is well controlled, and we  will allow for permissive hypertension in the setting of probable acute cerebrovascular accident.  3.  Benign prostatic hypertrophy: Continue doxazosin.  4.  Gastroesophageal reflux disease: Continue treatment with Protonix while inpatient.  5.  Deep vein thrombosis prophylaxis with SCDs. The patient is ambulatory.   TIME SPENT ON ADMISSION: 40 minutes     ____________________________ Earleen Newport. Volanda Napoleon, MD cpw:MT D: 12/03/2013 11:02:42 ET T: 12/03/2013 11:27:07 ET JOB#: [id number]  cc: Barnetta Chapel P. Volanda Napoleon, MD, <Dictator> Aldean Jewett MD ELECTRONICALLY SIGNED 12/03/2013 17:57

## 2014-05-14 NOTE — Discharge Summary (Signed)
PATIENT NAME:  Todd Golden, WINGERTER MR#:  [id number] DATE OF BIRTH:  01-Dec-1930  DATE OF ADMISSION:  12/03/2013 DATE OF DISCHARGE:  12/04/2013  ADMITTING DIAGNOSIS: Expressive aphasia due to stroke.   DISCHARGE DIAGNOSES:  1.  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  2.  Essential hypertension.  3.  Hyperlipidemia with LDL of 105.  4.  History of BPH.  5.  Glaucoma.   DISCHARGE CONDITION: Stable.   DISCHARGE MEDICATIONS:  1.  The patient is to continue allergy shots, 2 shots once a week on Tuesdays.  2.  Latanoprost ophthalmic solution 0.005% one drop to each eye at bedtime.  3.  Pindolol 10 mg p.o. daily.  4.  Doxazosin 8 mg once daily.  5.  Simvastatin 10 mg p.o. at bedtime.  6.  Aspirin 81 mg p.o. once daily for 3 more months.  7.  Plavix 75 mg p.o. daily indefinitely.  8.  Pantoprazole 40 mg p.o. daily.   The patient is not to take HCTZ or amlodipine until recommended by primary care physician, and not to take omeprazole due to possible interaction with Plavix.   HOME OXYGEN: None.   DIET: 2 grams salt, low-fat, low-cholesterol, regular consistency.   ACTIVITY LIMITATIONS: As tolerated.    FOLLOWUP APPOINTMENTS: With outpatient physical therapy and speech therapy. Followup appointment with Dr. Derrel Nip in 2 days after discharge, Dr. Fletcher Anon, Dr. Rockey Situ in 1 to 2 days after discharge for long-term cardiac monitor placement, preferably a 60 day monitor versus a monitor which is called LINQ.    CONSULTANTS: Care management, social work, speech therapy, physical therapy, neurology Dr. Valora Corporal.    RADIOLOGIC STUDIES: CT scan of head without contrast 12/03/2013, showing atrophy with extensive small vessel chronic ischemic changes of deep cerebral white matter, old left frontal periventricular white matter infarct. No acute intracranial abnormalities were found. MRI of the brain without contrast 12/03/2013, revealing acute 3  cm left MCA infarct affecting the left anterior frontal gyrus in the expected region of Broca area. No mass effect or hemorrhage was noted. No other acute intracranial abnormality. Evidence of cerebral white matter, chronic small vessel disease, chronic microhemorrhage in the right thalamus was noted. Carotid ultrasound 12/03/2013, showing trace Schmorl's heterogeneous atherosclerotic plaque on the right without evidence of stenosis. Vertebral arteries were patent with normal antegrade flow. Echocardiogram 12/04/2013, revealing left ventricular ejection fraction by visual estimation 55% to 60%, normal global left ventricular systolic function, mildly dilated left atrium, mild aortic valve sclerosis without stenosis, mild tricuspid regurgitation.   HOSPITAL COURSE: The patient is an 81 year old Caucasian male with history of hypertension, who presents to the hospital with complaints of inability to speak. Please refer to Dr. Loistine Chance admission note on 12/03/2013. The patient presented with acute onset of expressive aphasia. He was not able to speak in the morning. He was unable also to write. He was able to understand and follow commands and move normally. He was seen by primary care physician and sent to the Emergency Room. In the Emergency Room, he was noted to have profound expressive aphasia and was admitted to the hospital for further evaluation. In the Emergency Room, the patient's temperature was 98.5, pulse was 74, respiration rate was 20, blood pressure 129/79, saturation was 97% on room air. Physical exam was unremarkable except for expressive aphasia. He was unable to write and was writing nonsense words. Cerebellar examination was also normal. The patient's  laboratory data done on arrival to the  hospital revealed elevated glucose level of 101, BUN of 21, otherwise BMP was unremarkable. The patient's liver enzymes were normal. The patient's cardiac enzymes x 3 were within normal limits. TSH was normal at  1.68. CBC, white blood cell count was 5.1, hemoglobin 12.9, platelet count was 200,000. Urinalysis, yellow clear urine, negative for glucose, bilirubin or ketones. Specific gravity was 1.005, pH was 5.0, negative for blood, protein, nitrites or leukocyte esterase, 0 to 5 red blood cells, as well as 0 to 5 white blood cells. Negative for bacteria. No epithelial cells. Mucus was present and 0 to 5 hyaline casts.   The patient was admitted to the hospital for further evaluation. He had CT scan of the head which showed no acute infarct, and hospitalist services were contacted for admission. The patient underwent carotid ultrasound, as well as echocardiogram, as well as MRI of his brain to rule out stroke. MRI of the brain revealed a left MCA infarct. The patient was seen by neurologist, Dr. Valora Corporal, who felt that the patient should continue aspirin therapy for 3 more months. He is also to be started on Plavix. The patient is to continue Plavix indefinitely. In regard to lipid profile, which was done while he was in the hospital, the patient's LDL was found to be 105. His total cholesterol level was found to be 168. Triglycerides were 96 and the HDL was 44. The patient was initiated on Zocor. The patient is to continue dual antiplatelet therapy with aspirin and Plavix as mentioned above for 3 months, and then continue Plavix alone. He is to continue also Zocor upon discharge. He is to follow up with outpatient physical therapy, as well as speech therapy as that is where he was referred after he was seen by physical therapist, as well as a speech therapist here in the hospital. On the day of discharge, 12/04/2013, his expressive aphasia has improved significantly. Since his echocardiogram was unremarkable, the patient will likely need to have a cardiac monitor for 60 days, which is standard of care. The patient is being referred to cardiologist, Dr. Rockey Situ, as well as Dr. Fletcher Anon. Holter monitor will be placed for  him upon discharge from the hospital. He is to follow up with [facility] neurology in the next 3 months after discharge per neurologist Dr. Bertram Millard recommendations. In regard to essential hypertension, the patient is to continue his usual doses of the pindolol and doxazosin; however, HCTZ as well as amlodipine were suspended. The patient should be restarted on these medications in approximately 1 week after discharge keeping his blood pressure at around 884Z to 660Y systolic meanwhile. Regarding hyperlipidemia, as mentioned above, the patient is to continue Zocor. His LDL was found to be 105. For history of glaucoma, as well as BPH, he is to continue his outpatient management, no further changes were made. On the day of discharge, the patient's vital signs: Temperature was 98.1, pulse was 63, respiration rate was 18, blood pressure 154/84, saturation was 94% to 95% on room air at rest.   TIME SPENT: 40 minutes on the patient.    ____________________________ Theodoro Grist, MD rv:at D: 12/04/2013 17:22:44 ET T: 12/04/2013 18:25:47 ET JOB#: [id number]  cc: Theodoro Grist, MD, <Dictator> Deborra Medina, MD Minna Merritts, MD Mertie Clause. Fletcher Anon, MD  Theodoro Grist MD ELECTRONICALLY SIGNED 12/19/2013 20:13

## 2014-05-14 NOTE — Consult Note (Signed)
Referring Physician:  Aldean Jewett :   Primary Care Physician:  Aldean Jewett : [place], [address] [place], [place] [address], [place] [phone]  Reason for Consult: Admit Date: 03-Dec-2013  Chief Complaint: stroke  Reason for Consult: CVA   History of Present Illness: History of Present Illness:   81 yo RHD M presents to [facility] secondary to difficulty talking since he has awakened today.  Due to unknown onset time, pt was not a tPA candidate.  There is no obvious weakness or numbness per pt.  Pt has never had anything like this previously per wife.  ROS:  Review of Systems   unobtainable secondary to aphasia  Past Medical/Surgical Hx:  HTN:   Past Medical/ Surgical Hx:  Past Medical History HTN, BPH   Past Surgical History none   Home Medications: Medication Instructions Last Modified Date/Time  allergy shots 2 shots once a week on Tuesdays 13-Nov-15 10:06  latanoprost ophthalmic 0.005% ophthalmic solution 1 drop(s) to both eyes once a day (at bedtime) 13-Nov-15 10:06  hydrochlorothiazide 25 mg oral tablet 1 tab(s) orally once a day 13-Nov-15 10:06  amLODIPine 5 mg oral tablet 1 tab(s) orally once a day 13-Nov-15 10:06  pindolol 10 mg oral tablet 1 tab(s) orally once a day 13-Nov-15 10:06  omeprazole 20 mg oral delayed release capsule 1 cap(s) orally 2 times a day, As Needed - for Indigestion, Heartburn 13-Nov-15 10:06  doxazosin 8 mg oral tablet 1 tab(s) orally once a day 13-Nov-15 10:06   Allergies:  No Known Allergies:   Allergies:  Allergies NKDA   Social/Family History: Employment Status: retired  Lives With: spouse  Living Arrangements: house  Social History: no tob, no EtOH, no illicits  Family History: no strokes, no seizures   Vital Signs: **Vital Signs.:   13-Nov-15 16:34  Vital Signs Type Q 4hr  Temperature Temperature (F) 98.7  Celsius 37  Pulse Pulse 73  Respirations Respirations 18  Systolic BP Systolic BP 710   Diastolic BP (mmHg) Diastolic BP (mmHg) 80  Mean BP 103  Pulse Ox % Pulse Ox % 93  Pulse Ox Activity Level  At rest  Oxygen Delivery Room Air/ 21 %   Physical Exam: General: nl weight, NAD  HEENT: normocephalic, sclera nonicteric, oropharynx clear  Neck: supple, no JVD, no bruits  Chest: CTA B, no wheezing, good movement  Cardiac: RRR, no murmurs, no edema, 2+ pulses  Extremities: no C/C/E, FROM   Neurologic Exam: Mental Status: alert but severe expressive and mild receptive aphasia, mute  Cranial Nerves: PERRLA, EOMI, nl VF, face symmetric, tongue midline, shoulder shrug equal  Motor Exam: 5/5 B normal, tone, no tremor  Deep Tendon Reflexes: 2+/4 B, plantars downgoing B, no Hoffman  Sensory Exam: temp intact B  Coordination: FTN and HTS WNL   Lab Results: Thyroid:  13-Nov-15 09:13   Thyroid Stimulating Hormone 1.68 (0.45-4.50 (IU = International Unit)  ----------------------- Pregnant patients have  different reference  ranges for TSH:  - - - - - - - - - -  Pregnant, first trimetser:  0.36 - 2.50 uIU/mL)  Hepatic:  13-Nov-15 09:13   Bilirubin, Total 0.6  Bilirubin, Direct < 0.1 (Result(s) reported on 03 Dec 2013 at 12:31PM.)  Alkaline Phosphatase 60 (46-116 NOTE: New Reference Range 08/10/13)  SGPT (ALT) 29 (14-63 NOTE: New Reference Range 08/10/13)  SGOT (AST) 30  Total Protein, Serum 7.2  Albumin, Serum 3.5  Routine Chem:  13-Nov-15 09:13   Hemoglobin A1c (  [facility]) 5.9 (The American Diabetes Association recommends that a primary goal of therapy should be <7% and that physicians should reevaluate the treatment regimen in patients with HbA1c values consistently >8%.)  Glucose, Serum  101  BUN  21  Creatinine (comp) 1.19  Sodium, Serum 142  Potassium, Serum 3.7  Chloride, Serum 105  CO2, Serum 29  Calcium (Total), Serum  8.2  Anion Gap 8  Osmolality (calc) 286  eGFR (African American) >60  eGFR (Non-African American) >60 (eGFR values <62m/min/1.73 m2 may  be an indication of chronic kidney disease (CKD). Calculated eGFR, using the MRDR Study equation, is useful in  patients with stable renal function. The eGFR calculation will not be reliable in acutely ill patients when serum creatinine is changing rapidly. It is not useful in patients on dialysis. The eGFR calculation may not be applicable to patients at the low and high extremes of body sizes, pregnant women, and vegetarians.)  Cardiac:  13-Nov-15 13:06   Troponin I < 0.02 (0.00-0.05 0.05 ng/mL or less: NEGATIVE  Repeat testing in 3-6 hrs  if clinically indicated. >0.05 ng/mL: POTENTIAL  MYOCARDIAL INJURY. Repeat  testing in 3-6 hrs if  clinically indicated. NOTE: An increase or decrease  of 30% or more on serial  testing suggests a  clinically important change)  CK, Total 184 (39-308 NOTE: NEW REFERENCE RANGE  02/22/2013)  CPK-MB, Serum  3.8 (Result(s) reported on 03 Dec 2013 at 01:41PM.)  Routine UA:  13-Nov-15 10:15   Color (UA) YELLOW  Clarity (UA) CLEAR  Glucose (UA) NEGATIVE  Bilirubin (UA) NEGATIVE  Ketones (UA) NEGATIVE  Specific Gravity (UA) 1.005  Blood (UA) NEGATIVE  pH (UA) 5.0  Protein (UA) NEGATIVE  Nitrite (UA) NEGATIVE  Leukocyte Esterase (UA) NEGATIVE (Result(s) reported on 03 Dec 2013 at 11:14AM.)  RBC (UA) 0-5  WBC (UA) 0-5  Bacteria (UA) NEGATIVE  Epithelial Cells (UA) NONE SEEN  Mucous (UA) PRESENT  Hyaline Cast (UA) 0-5 / LPF  Result(s) reported on 03 Dec 2013 at 11:14AM.  Routine Hem:  13-Nov-15 09:13   WBC (CBC) 5.1  RBC (CBC)  4.11  Hemoglobin (CBC)  12.9  Hematocrit (CBC)  38.8  Platelet Count (CBC) 200 (Result(s) reported on 03 Dec 2013 at 09:40AM.)  MCV 94  MCH 31.4  MCHC 33.2  RDW 12.9   Radiology Results: [place]    13-Nov-15 13:58, [place] Doppler Bilateral  [place] Doppler Bilateral   REASON FOR EXAM:    CVA  COMMENTS:       PROCEDURE: [place] - UKorea[place]OTID DOPPLER BILATERAL  - Dec 03 2013  1:58PM     CLINICAL DATA:   81year old male with history of cerebral vascular  accident.    EXAM:  BILATERAL CAROTID DUPLEX ULTRASOUND    TECHNIQUE:  GPearline Cablesscale imaging, color Doppler and duplex ultrasound were  performed of bilateral carotid and vertebral arteries in the neck.  COMPARISON:  Head CT 12/03/2013    FINDINGS:  Criteria: Quantification of carotid stenosis is based on velocity  parameters that correlate the residual internal carotid diameter  with NASCET-based stenosis levels, using the diameter of the distal  internal carotid lumen as the denominator for stenosis measurement.    The following velocity measurements were obtained:    RIGHT    ICA:  78/21 cm/sec    CCA:  788/4cm/sec  SYSTOLIC ICA/CCA RATIO:  0.9    DIASTOLIC ICA/CCA RATIO:  0.7    ECA:  78 cm/sec  LEFT    ICA:  61/8 cm/sec    CCA:  852/77 cm/sec    SYSTOLIC ICA/CCA RATIO:  0.7    DIASTOLIC ICA/CCA RATIO:  0.4  ECA:  87 cm/sec    RIGHT CAROTID ARTERY: Scant smooth heterogeneous atherosclerotic  plaque in the right carotid bulb without evidence of stenosis.    RIGHT VERTEBRAL ARTERY:  Patent with normal antegrade flow.    LEFT CAROTID ARTERY: No significantatherosclerotic plaque or  evidence of stenosis.    LEFT VERTEBRAL ARTERY: Vertebral artery is patent with normal  antegrade flow.     IMPRESSION:  1. Trace smooth heterogeneous atherosclerotic plaque on the right  without evidence of stenosis.  2. Vertebral arteries are patent with normal antegrade flow.  Signed,    Criselda Peaches, MD    [facility]    [facility]      Electronically Signed    By: Jacqulynn Cadet M.D.    On: 12/03/2013 14:03     Verified By: Criselda Peaches, M.D.,  MRI:    13-Nov-15 14:56, MRI Brain Without Contrast  MRI Brain Without Contrast   REASON FOR EXAM:    CVA  COMMENTS:       PROCEDURE: MR  - MR BRAIN WO CONTRAST  - Dec 03 2013  2:56PM     CLINICAL  DATA:  81 year old male with loss of speech upon awakening  today. Initial encounter.    EXAM:  MRI HEAD WITHOUT CONTRAST    TECHNIQUE:  Multiplanar, multiecho pulse sequences of the brain and surrounding  structures were obtained without intravenous contrast.  COMPARISON:  Head CT without contrast 0846 hr the same day.    FINDINGS:  Confluent restricted diffusion affecting a 3 cm area of the left  frontal operculum including the left inferior frontal gyrus in the  expected region of Broca area. No temporal lobe involvement  identified. No definite insula involvement. Minimal T2 and FLAIR  hyperintensity. No associated acute hemorrhage or mass effect.    No restricted diffusion elsewhere. Major intracranial vascular flow  voids are preserved, dominant distal left vertebral artery.    No midline shift, mass effect, or evidence of intracranial mass  lesion. No ventriculomegaly.No acute intracranial hemorrhage  identified. Chronic micro hemorrhage in the right thalamus. Negative  pituitary, cervicomedullary junction and visualized cervical spine.  Confluent bilateral cerebral white matter T2 and FLAIR  hyperintensity superimposed on the acute findings. Several oval  chronic lacunar infarcts versus perivascular spaces in the left  corona radiata.    Visible internal auditory structures appear normal. Mild paranasal  sinus mucosal thickening. Mastoids are clear. Postoperative changes  to the right globe. Visualized scalp soft tissues are within normal  limits. Normal bone marrow signal.     IMPRESSION:  1. Acute 3 cm left MCA infarct affecting the left inferior frontal  gyrus in the expected region of Broca's area. Nomass effect or  hemorrhage.  2. No other acute intracranial abnormality.  3. Evidence of cerebral white matter chronic small vessel disease.  Chronic micro hemorrhage in the right thalamus.      Electronically Signed    By: Lars Pinks M.D.    On: 12/03/2013 15:05          Verified By: Gwenyth Bender. HALL, M.D.,  CT:    13-Nov-15 08:56, CT Head Without Contrast  CT Head Without Contrast   REASON FOR EXAM:    unable to speak  COMMENTS:  PROCEDURE: CT  - CT HEAD WITHOUT CONTRAST  - Dec 03 2013  8:56AM     CLINICAL DATA:  Unable to speak this morning ; patient unable to  provide additional history    EXAM:  CT HEAD WITHOUT CONTRAST    TECHNIQUE:  Contiguous axial images were obtained from the base of the skull  through the vertex without intravenous contrast.  COMPARISON:  None    FINDINGS:  Generalized atrophy.    Normal ventricular morphology.    No midline shift or mass effect.    Extensive small vessel chronic ischemic changes of deep cerebral  white matter.    LEFT frontal periventricular white matter infarct.    No intracranial hemorrhage, mass lesion or evidence of acute  cortical infarction.    No extra-axial fluid collections.    Osseous demineralization.    Bones otherwise unremarkable.    Mucosal retention cysts at at RIGHT maxillary sinus and with a cyst  or polyp at the LEFT nasal passages.     IMPRESSION:  Atrophy with extensive small vessel chronic ischemic changes of deep  cerebral white matter.  Old LEFT frontal periventricular white matter infarct.    No acute intracranial abnormalities.      Electronically Signed    By: Lavonia Dana M.D.    On: 12/03/2013 09:00         Verified By: Burnetta Sabin, M.D.,   Radiology Impression: Radiology Impression: MRI of brain personally reviewed by me and shows a moderate L frontal acute infarct, severe WM changes   Impression/Recommendations: Recommendations:   prior notes reviewed by me reviewed by me   Acute L frontal infarct-  etiology is likely cardioembolic and this is very symptomatic causing aphasia HTN-  appears controlled echo pending agree with ASA 31m and statin needs plavix 784mdaily as well pending lipids if echo is neg, pt will need an  Event monitor for 60 days speech therapy will follow  Electronic Signatures: SmJamison NeighborMD)  (Signed 13-Nov-15 18:01)  Authored: REFERRING PHYSICIAN, Primary Care Physician, Consult, History of Present Illness, Review of Systems, PAST MEDICAL/SURGICAL HISTORY, HOME MEDICATIONS, ALLERGIES, Social/Family History, NURSING VITAL SIGNS, Physical Exam-, LAB RESULTS, RADIOLOGY RESULTS, Recommendations   Last Updated: 13-Nov-15 18:01 by SmJamison NeighborMD)

## 2014-05-17 DIAGNOSIS — J301 Allergic rhinitis due to pollen: Secondary | ICD-10-CM | POA: Diagnosis not present

## 2014-05-17 DIAGNOSIS — J3089 Other allergic rhinitis: Secondary | ICD-10-CM | POA: Diagnosis not present

## 2014-05-19 DIAGNOSIS — J301 Allergic rhinitis due to pollen: Secondary | ICD-10-CM | POA: Diagnosis not present

## 2014-05-19 DIAGNOSIS — J3089 Other allergic rhinitis: Secondary | ICD-10-CM | POA: Diagnosis not present

## 2014-05-26 DIAGNOSIS — J301 Allergic rhinitis due to pollen: Secondary | ICD-10-CM | POA: Diagnosis not present

## 2014-05-26 DIAGNOSIS — J3089 Other allergic rhinitis: Secondary | ICD-10-CM | POA: Diagnosis not present

## 2014-05-31 DIAGNOSIS — J301 Allergic rhinitis due to pollen: Secondary | ICD-10-CM | POA: Diagnosis not present

## 2014-05-31 DIAGNOSIS — J3089 Other allergic rhinitis: Secondary | ICD-10-CM | POA: Diagnosis not present

## 2014-06-07 DIAGNOSIS — J301 Allergic rhinitis due to pollen: Secondary | ICD-10-CM | POA: Diagnosis not present

## 2014-06-07 DIAGNOSIS — J3089 Other allergic rhinitis: Secondary | ICD-10-CM | POA: Diagnosis not present

## 2014-06-14 DIAGNOSIS — J301 Allergic rhinitis due to pollen: Secondary | ICD-10-CM | POA: Diagnosis not present

## 2014-06-14 DIAGNOSIS — J3089 Other allergic rhinitis: Secondary | ICD-10-CM | POA: Diagnosis not present

## 2014-06-17 ENCOUNTER — Other Ambulatory Visit: Payer: Self-pay | Admitting: Internal Medicine

## 2014-06-17 DIAGNOSIS — Z760 Encounter for issue of repeat prescription: Secondary | ICD-10-CM

## 2014-06-23 DIAGNOSIS — J301 Allergic rhinitis due to pollen: Secondary | ICD-10-CM | POA: Diagnosis not present

## 2014-06-23 DIAGNOSIS — J3089 Other allergic rhinitis: Secondary | ICD-10-CM | POA: Diagnosis not present

## 2014-06-28 DIAGNOSIS — J3089 Other allergic rhinitis: Secondary | ICD-10-CM | POA: Diagnosis not present

## 2014-06-28 DIAGNOSIS — J301 Allergic rhinitis due to pollen: Secondary | ICD-10-CM | POA: Diagnosis not present

## 2014-06-28 DIAGNOSIS — J3081 Allergic rhinitis due to animal (cat) (dog) hair and dander: Secondary | ICD-10-CM | POA: Diagnosis not present

## 2014-06-30 ENCOUNTER — Other Ambulatory Visit: Payer: Self-pay | Admitting: Cardiovascular Disease

## 2014-07-05 DIAGNOSIS — J301 Allergic rhinitis due to pollen: Secondary | ICD-10-CM | POA: Diagnosis not present

## 2014-07-05 DIAGNOSIS — J3089 Other allergic rhinitis: Secondary | ICD-10-CM | POA: Diagnosis not present

## 2014-07-12 DIAGNOSIS — J301 Allergic rhinitis due to pollen: Secondary | ICD-10-CM | POA: Diagnosis not present

## 2014-07-12 DIAGNOSIS — J3089 Other allergic rhinitis: Secondary | ICD-10-CM | POA: Diagnosis not present

## 2014-07-14 ENCOUNTER — Other Ambulatory Visit: Payer: Self-pay | Admitting: Internal Medicine

## 2014-07-19 DIAGNOSIS — J3089 Other allergic rhinitis: Secondary | ICD-10-CM | POA: Diagnosis not present

## 2014-07-19 DIAGNOSIS — J301 Allergic rhinitis due to pollen: Secondary | ICD-10-CM | POA: Diagnosis not present

## 2014-07-26 DIAGNOSIS — J301 Allergic rhinitis due to pollen: Secondary | ICD-10-CM | POA: Diagnosis not present

## 2014-07-26 DIAGNOSIS — J3089 Other allergic rhinitis: Secondary | ICD-10-CM | POA: Diagnosis not present

## 2014-07-27 ENCOUNTER — Ambulatory Visit (INDEPENDENT_AMBULATORY_CARE_PROVIDER_SITE_OTHER): Payer: Medicare Other | Admitting: Internal Medicine

## 2014-07-27 ENCOUNTER — Encounter: Payer: Self-pay | Admitting: Internal Medicine

## 2014-07-27 VITALS — BP 137/80 | HR 67 | Temp 98.3°F | Ht 70.0 in | Wt 200.4 lb

## 2014-07-27 DIAGNOSIS — E785 Hyperlipidemia, unspecified: Secondary | ICD-10-CM | POA: Diagnosis not present

## 2014-07-27 DIAGNOSIS — R200 Anesthesia of skin: Secondary | ICD-10-CM

## 2014-07-27 DIAGNOSIS — I63319 Cerebral infarction due to thrombosis of unspecified middle cerebral artery: Secondary | ICD-10-CM | POA: Diagnosis not present

## 2014-07-27 DIAGNOSIS — Z1159 Encounter for screening for other viral diseases: Secondary | ICD-10-CM | POA: Diagnosis not present

## 2014-07-27 DIAGNOSIS — Z9181 History of falling: Secondary | ICD-10-CM | POA: Insufficient documentation

## 2014-07-27 DIAGNOSIS — R208 Other disturbances of skin sensation: Secondary | ICD-10-CM

## 2014-07-27 DIAGNOSIS — Z7901 Long term (current) use of anticoagulants: Secondary | ICD-10-CM

## 2014-07-27 DIAGNOSIS — I10 Essential (primary) hypertension: Secondary | ICD-10-CM

## 2014-07-27 DIAGNOSIS — I63519 Cerebral infarction due to unspecified occlusion or stenosis of unspecified middle cerebral artery: Secondary | ICD-10-CM

## 2014-07-27 LAB — CBC WITH DIFFERENTIAL/PLATELET
Basophils Absolute: 0 10*3/uL (ref 0.0–0.1)
Basophils Relative: 0.5 % (ref 0.0–3.0)
Eosinophils Absolute: 0.2 10*3/uL (ref 0.0–0.7)
Eosinophils Relative: 3.2 % (ref 0.0–5.0)
HCT: 40.4 % (ref 39.0–52.0)
Hemoglobin: 13.5 g/dL (ref 13.0–17.0)
Lymphocytes Relative: 25.8 % (ref 12.0–46.0)
Lymphs Abs: 1.4 10*3/uL (ref 0.7–4.0)
MCHC: 33.4 g/dL (ref 30.0–36.0)
MCV: 91.1 fl (ref 78.0–100.0)
Monocytes Absolute: 0.6 10*3/uL (ref 0.1–1.0)
Monocytes Relative: 11.7 % (ref 3.0–12.0)
Neutro Abs: 3.2 10*3/uL (ref 1.4–7.7)
Neutrophils Relative %: 58.8 % (ref 43.0–77.0)
Platelets: 231 10*3/uL (ref 150.0–400.0)
RBC: 4.44 Mil/uL (ref 4.22–5.81)
RDW: 13.8 % (ref 11.5–15.5)
WBC: 5.5 10*3/uL (ref 4.0–10.5)

## 2014-07-27 LAB — LIPID PANEL
Cholesterol: 151 mg/dL (ref 0–200)
HDL: 49.8 mg/dL (ref >39.00)
LDL Cholesterol: 84 mg/dL (ref 0–99)
NonHDL: 101.2
Total CHOL/HDL Ratio: 3
Triglycerides: 86 mg/dL (ref 0.0–149.0)
VLDL: 17.2 mg/dL (ref 0.0–40.0)

## 2014-07-27 LAB — COMPREHENSIVE METABOLIC PANEL
ALT: 22 U/L (ref 0–53)
AST: 25 U/L (ref 0–37)
Albumin: 4 g/dL (ref 3.5–5.2)
Alkaline Phosphatase: 52 U/L (ref 39–117)
BUN: 21 mg/dL (ref 6–23)
CO2: 29 mEq/L (ref 19–32)
Calcium: 9.3 mg/dL (ref 8.4–10.5)
Chloride: 103 mEq/L (ref 96–112)
Creatinine, Ser: 1.04 mg/dL (ref 0.40–1.50)
GFR: 72.66 mL/min (ref >60.00)
Glucose, Bld: 101 mg/dL — ABNORMAL HIGH (ref 70–99)
Potassium: 4.1 mEq/L (ref 3.5–5.1)
Sodium: 139 mEq/L (ref 135–145)
Total Bilirubin: 0.6 mg/dL (ref 0.2–1.2)
Total Protein: 7.4 g/dL (ref 6.0–8.3)

## 2014-07-27 LAB — TSH: TSH: 1.55 u[IU]/mL (ref 0.35–4.50)

## 2014-07-27 MED ORDER — AMLODIPINE BESYLATE 5 MG PO TABS: ORAL_TABLET | ORAL | Status: DC

## 2014-07-27 MED ORDER — OMEPRAZOLE 40 MG PO CPDR: 40.0000 mg | DELAYED_RELEASE_CAPSULE | Freq: Every day | ORAL | Status: DC

## 2014-07-27 NOTE — Assessment & Plan Note (Signed)
The conditions surrounding both falls were reviewed . He leads an active outdoor life.  No interventions were advised.

## 2014-07-27 NOTE — Progress Notes (Signed)
Subjective:  Patient ID: Todd Golden, male    DOB: 1930/05/10  Age: 81 y.o. MRN: [id number]  CC: The primary encounter diagnosis was Essential hypertension, benign. 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  HPI Todd Golden presents for followup on hypertension, hyperlipidemia.  He is s/p CV in Oct 2105 and continues to have occasional dysarthria but most of the time his speech is articulate and fluent .    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     Some numbness of left index finger and hand, mostly at night,  no loss of strength  Left foot tendon rupture previously evaluated by [facility].  Occasional foot drop but no falls due to the foot drop although he reports two minor falls due to tripping on some tree roota and running into the garage door.  No bleeding or bruising   As of yet.  Notes not available from [facility].  Outpatient Prescriptions Prior to Visit  Medication Sig Dispense Refill  . doxazosin (CARDURA) 8 MG tablet TAKE 1 TABLET BY MOUTH EVERY DAY 90 tablet 1  . doxazosin (CARDURA) 8 MG tablet TAKE 1 TABLET BY MOUTH EVERY DAY 30 tablet 0  . ELIQUIS 5 MG TABS tablet TAKE 1 TABLET (5 MG TOTAL) BY MOUTH 2 (TWO) TIMES DAILY. 60 tablet 6  . EPIPEN 2-PAK 0.3 MG/0.3ML SOAJ injection Inject 2 mLs into the muscle as needed.    . hydrochlorothiazide (HYDRODIURIL) 25 MG tablet TAKE 1 TABLET (25 MG TOTAL) BY MOUTH DAILY. 90 tablet 1  . latanoprost (XALATAN) 0.005 % ophthalmic solution Place 1 drop into both eyes at bedtime.    . Multiple Vitamins-Minerals (PRESERVISION AREDS 2 PO) Take 1 tablet by mouth daily.    . Multiple Vitamins-Minerals (PRESERVISION AREDS 2 PO)  Take by mouth daily.    . naproxen sodium (ANAPROX) 220 MG tablet Take 220 mg by mouth daily as needed.     . pindolol (VISKEN) 10 MG tablet Take 1 tablet (10 mg total) by mouth daily. 90 tablet 1  . simvastatin (ZOCOR) 20 MG tablet TAKE 1 TABLET (20 MG TOTAL) BY MOUTH DAILY. 90 tablet 1  . zoster vaccine live, PF, (ZOSTAVAX) 53299 UNT/0.65ML injection Inject 19,400 Units into the skin once. 1 each 0  . amLODipine (NORVASC) 5 MG tablet TAKE 1 TABLET (5 MG TOTAL) BY MOUTH DAILY. 90 tablet 1  . omeprazole (PRILOSEC) 40 MG capsule Take 1 capsule (40 mg total) by mouth daily. 90 capsule 1   No facility-administered medications prior to visit.    Review of Systems;  Patient denies headache, fevers, malaise, unintentional weight loss, skin rash, eye pain, sinus congestion and sinus pain, sore throat, dysphagia,  hemoptysis , cough, dyspnea, wheezing, chest pain, palpitations, orthopnea, edema, abdominal pain, nausea, melena, diarrhea, constipation, flank pain, dysuria, hematuria, urinary  Frequency, nocturia, numbness, tingling, seizures,  Focal weakness, Loss of consciousness,  Tremor, insomnia, depression, anxiety, and suicidal ideation.      Objective:  BP 137/80 mmHg  Pulse 67  Temp(Src) 98.3 F (36.8 C) (Oral)  Ht 5\' 10"  (1.778 m)  Wt 200 lb 6 oz (90.89 kg)  BMI 28.75 kg/m2  SpO2 96%  BP Readings  from Last 3 Encounters:  07/27/14 137/80  03/18/14 112/62  01/24/14 108/66    Wt Readings from Last 3 Encounters:  07/27/14 200 lb 6 oz (90.89 kg)  03/18/14 200 lb 12 oz (91.06 kg)  01/24/14 198 lb 4 oz (89.926 kg)    General appearance: alert, cooperative and appears stated age Ears: normal TM's and external ear canals both ears Throat: lips, mucosa, and tongue normal; teeth and gums normal Neck: no adenopathy, no carotid bruit, supple, symmetrical, trachea midline and thyroid not enlarged, symmetric, no tenderness/mass/nodules Back: symmetric, no curvature. ROM normal. No CVA  tenderness. Lungs: clear to auscultation bilaterally Heart: regular rate and rhythm, S1, S2 normal, no murmur, click, rub or gallop Abdomen: soft, non-tender; bowel sounds normal; no masses,  no organomegaly Pulses: 2+ and symmetric Skin: Skin color, texture, turgor normal. No rashes or lesions Lymph nodes: Cervical, supraclavicular, and axillary nodes normal.  Lab Results  Component Value Date   HGBA1C 5.9 12/04/2013    Lab Results  Component Value Date   CREATININE 1.04 07/27/2014   CREATININE 1.3 01/19/2014   CREATININE 1.11 12/04/2013    Lab Results  Component Value Date   WBC 5.5 07/27/2014   HGB 13.5 07/27/2014   HCT 40.4 07/27/2014   PLT 231.0 07/27/2014   GLUCOSE 101* 07/27/2014   CHOL 151 07/27/2014   TRIG 86.0 07/27/2014   HDL 49.80 07/27/2014   LDLCALC 84 07/27/2014   ALT 22 07/27/2014   AST 25 07/27/2014   NA 139 07/27/2014   K 4.1 07/27/2014   CL 103 07/27/2014   CREATININE 1.04 07/27/2014   BUN 21 07/27/2014   CO2 29 07/27/2014   TSH 1.55 07/27/2014   PSA 2.97 10/21/2012   HGBA1C 5.9 12/04/2013    No results found.  Assessment & Plan:   Problem List Items Addressed This Visit      Unprioritized   Essential hypertension, benign - Primary    Well controlled on current regimen. Renal function stable, no changes today.  Lab Results  Component Value Date   CREATININE 1.04 07/27/2014   Lab Results  Component Value Date   NA 139 07/27/2014   K 4.1 07/27/2014   CL 103 07/27/2014   CO2 29 07/27/2014         Relevant Medications   amLODipine (NORVASC) 5 MG tablet   Other Relevant Orders   Comprehensive metabolic panel (Completed)   Left arm numbness    Neurologic exam is normal and history suggests that his recurrent numbness may be due to CTS or cervical spine DJD.       CVA (cerebral infarction)    With marked improvement in Broca's aphasia .  His speech is now articulate      Hyperlipidemia LDL goal <100    Patient tolerating  zocor for secondary prevention due to recent nonhemorrhagic left MCA territory CVA.  Carotid placque nonobstructive was noted bilaterally         Relevant Medications   amLODipine (NORVASC) 5 MG tablet   Other Relevant Orders   Lipid panel (Completed)   TSH (Completed)   History of fall within past 90 days    The conditions surrounding both falls were reviewed . He leads an active outdoor life.  No interventions were advised.        Other Visit Diagnoses    Need for hepatitis C screening test        Relevant Orders    Hepatitis C antibody    Long  term current use of anticoagulant therapy        Relevant Orders    CBC with Differential/Platelet (Completed)      A total of 30 minutes of face to face time was spent with patient more than half of which was spent in counselling and coordination of care   I am having Mr. Roycroft maintain his latanoprost, EPIPEN 2-PAK, naproxen sodium, zoster vaccine live (PF), Multiple Vitamins-Minerals (PRESERVISION AREDS 2 PO), doxazosin, Multiple Vitamins-Minerals (PRESERVISION AREDS 2 PO), pindolol, simvastatin, hydrochlorothiazide, ELIQUIS, doxazosin, Misc Natural Products (OSTEO BI-FLEX TRIPLE STRENGTH PO), amLODipine, and omeprazole.  Meds ordered this encounter  Medications  . Misc Natural Products (OSTEO BI-FLEX TRIPLE STRENGTH PO)    Sig: Take by mouth.  Marland Kitchen amLODipine (NORVASC) 5 MG tablet    Sig: TAKE 1 TABLET (5 MG TOTAL) BY MOUTH DAILY.    Dispense:  90 tablet    Refill:  0  . omeprazole (PRILOSEC) 40 MG capsule    Sig: Take 1 capsule (40 mg total) by mouth daily.    Dispense:  90 capsule    Refill:  3    Medications Discontinued During This Encounter  Medication Reason  . amLODipine (NORVASC) 5 MG tablet Reorder  . omeprazole (PRILOSEC) 40 MG capsule Reorder    Follow-up: Return in about 6 months (around 01/27/2015).   Crecencio Mc, MD

## 2014-07-27 NOTE — Assessment & Plan Note (Signed)
Well controlled on current regimen. Renal function stable, no changes today.  Lab Results  Component Value Date   CREATININE 1.04 07/27/2014   Lab Results  Component Value Date   NA 139 07/27/2014   K 4.1 07/27/2014   CL 103 07/27/2014   CO2 29 07/27/2014

## 2014-07-27 NOTE — Assessment & Plan Note (Signed)
Neurologic exam is normal and history suggests that his recurrent numbness may be due to CTS or cervical spine DJD.

## 2014-07-27 NOTE — Assessment & Plan Note (Signed)
With marked improvement in Broca's aphasia .  His speech is now articulate

## 2014-07-27 NOTE — Progress Notes (Signed)
Pre visit review using our clinic review tool, if applicable. No additional management support is needed unless otherwise documented below in the visit note. 

## 2014-07-27 NOTE — Assessment & Plan Note (Signed)
Patient tolerating zocor for secondary prevention due to recent nonhemorrhagic left MCA territory CVA.  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    

## 2014-07-27 NOTE — Patient Instructions (Signed)
Carpal Tunnel Syndrome °Carpal tunnel syndrome is a disorder of the nervous system in the wrist that causes pain, hand weakness, and/or loss of feeling. Carpal tunnel syndrome is caused by the compression, stretching, or irritation of the median nerve at the wrist joint. Athletes who experience carpal tunnel syndrome may notice a decrease in their performance to the condition, especially for sports that require strong hand or wrist action.  °SYMPTOMS  °· Tingling, numbness, or burning pain in the hand or fingers. °· Inability to sleep due to pain in the hand. °· Sharp pains that shoot from the wrist up the arm or to the fingers, especially at night. °· Morning stiffness or cramping of the hand. °· Thumb weakness, resulting in difficulty holding objects or making a fist. °· Shiny, dry skin on the hand. °· Reduced performance in any sport requiring a strong grip. °CAUSES  °· Median nerve damage at the wrist is caused by pressure due to swelling, inflammation, or scarred tissue. °· Sources of pressure include: °¨ Repetitive gripping or squeezing that causes inflammation of the tendon sheaths. °¨ Scarring or shortening of the ligament that covers the median nerve. °¨ Traumatic injury to the wrist or forearm such as fracture, sprain, or dislocation. °¨ Prolonged hyperextension (wrist bent backward) or hyperflexion (wrist bent downward) of the wrist. °RISK INCREASES WITH: °· Diabetes mellitus. °· Menopause or amenorrhea. °· Rheumatoid arthritis. °· Raynaud disease. °· Pregnancy. °· Gout. °· Kidney disease. °· Ganglion cyst. °· Repetitive hand or wrist action. °· Hypothyroidism (underactive thyroid gland). °· Repetitive jolting or shaking of the hands or wrist. °· Prolonged forceful weight-bearing on the hands. °PREVENTION °· Bracing the hand and wrist straight during activities that involve repetitive grasping. °· For activities that require prolonged extension of the wrist (bending towards the top of the forearm)  periodically change the position of your wrists. °· Learn and use proper technique in activities that result in the wrist position in neutral to slight extension. °· Avoid bending the wrist into full extension or flexion (up or down). °· Keep the wrist in a straight (neutral) position. To keep the wrist in this position, wear a splint. °· Avoid repetitive hand and wrist motions. °· When possible avoid prolonged grasping of items (steering wheel of a car, a pen, a vacuum cleaner, or a rake). °· Loosen your grip for activities that require prolonged grasping of items. °· Place keyboards and writing surfaces at the correct height as to decrease strain on the wrist and hand. °· Alternate work tasks to avoid prolonged wrist flexion. °· Avoid pinching activities (needlework and writing) as they may irritate your carpal tunnel syndrome. °· If these activities are necessary, complete them for shorter periods of time. °· When writing, use a felt tip or rollerball pen and/or build up the grip on a pen to decrease the forces required for writing. °PROGNOSIS  °Carpal tunnel syndrome is usually curable with appropriate conservative treatment and sometimes resolves spontaneously. For some cases, surgery is necessary, especially if muscle wasting or nerve changes have developed.  °RELATED COMPLICATIONS  °· Permanent numbness and a weak thumb or fingers in the affected hand. °· Permanent paralysis of a portion of the hand and fingers. °TREATMENT  °Treatment initially consists of stopping activities that aggravate the symptoms as well as medication and ice to reduce inflammation. A wrist splint is often recommended for wear during activities of repetitive motion as well as at night. It is also important to learn and use proper technique when   performing activities that typically cause pain. On occasion, a corticosteroid injection may be given. °If symptoms persist despite conservative treatment, surgery may be an option. Surgical  techniques free the pinched or compressed nerve. Carpal tunnel surgery is usually performed on an outpatient basis, meaning you go home the same day as surgery. These procedures provide almost complete relief of all symptoms in 95% of patients. Expect at least 2 weeks for healing after surgery. For cases that are the result of repeated jolting or shaking of the hand or wrist or prolonged hyperextension, surgery is not usually recommended because stretching of the median nerve, not compression, is usually the cause of carpal tunnel syndrome in these cases. °MEDICATION  °· If pain medication is necessary, nonsteroidal anti-inflammatory medications, such as aspirin and ibuprofen, or other minor pain relievers, such as acetaminophen, are often recommended. °· Do not take pain medication for 7 days before surgery. °· Prescription pain relievers are usually only prescribed after surgery. Use only as directed and only as much as you need. °· Corticosteroid injections may be given to reduce inflammation. However, they are not always recommended. °· Vitamin B6 (pyridoxine) may reduce symptoms; use only if prescribed for your disorder. °SEEK MEDICAL CARE IF:  °· Symptoms get worse or do not improve in 2 weeks despite treatment. °· You also have a current or recent history of neck or shoulder injury that has resulted in pain or tingling elsewhere in your arm. °Document Released: 01/07/2005 Document Revised: 05/24/2013 Document Reviewed: 04/21/2008 °[employer]® Patient Information ©2015 [employer]. This information is not intended to replace advice given to you by your health care provider. Make sure you discuss any questions you have with your health care provider. ° °

## 2014-07-28 LAB — HEPATITIS C ANTIBODY: HCV Ab: NEGATIVE

## 2014-07-30 ENCOUNTER — Encounter: Payer: Self-pay | Admitting: Internal Medicine

## 2014-07-31 ENCOUNTER — Other Ambulatory Visit: Payer: Self-pay | Admitting: Internal Medicine

## 2014-08-01 DIAGNOSIS — H40053 Ocular hypertension, bilateral: Secondary | ICD-10-CM | POA: Diagnosis not present

## 2014-08-02 DIAGNOSIS — J301 Allergic rhinitis due to pollen: Secondary | ICD-10-CM | POA: Diagnosis not present

## 2014-08-02 DIAGNOSIS — J3089 Other allergic rhinitis: Secondary | ICD-10-CM | POA: Diagnosis not present

## 2014-08-09 ENCOUNTER — Other Ambulatory Visit: Payer: Self-pay | Admitting: Internal Medicine

## 2014-08-09 DIAGNOSIS — J301 Allergic rhinitis due to pollen: Secondary | ICD-10-CM | POA: Diagnosis not present

## 2014-08-09 DIAGNOSIS — J3089 Other allergic rhinitis: Secondary | ICD-10-CM | POA: Diagnosis not present

## 2014-08-16 DIAGNOSIS — J301 Allergic rhinitis due to pollen: Secondary | ICD-10-CM | POA: Diagnosis not present

## 2014-08-16 DIAGNOSIS — J3089 Other allergic rhinitis: Secondary | ICD-10-CM | POA: Diagnosis not present

## 2014-08-19 NOTE — Telephone Encounter (Signed)
Mailed unread my chart message to patient

## 2014-08-23 DIAGNOSIS — J301 Allergic rhinitis due to pollen: Secondary | ICD-10-CM | POA: Diagnosis not present

## 2014-08-23 DIAGNOSIS — J3089 Other allergic rhinitis: Secondary | ICD-10-CM | POA: Diagnosis not present

## 2014-08-30 DIAGNOSIS — J301 Allergic rhinitis due to pollen: Secondary | ICD-10-CM | POA: Diagnosis not present

## 2014-08-30 DIAGNOSIS — J3089 Other allergic rhinitis: Secondary | ICD-10-CM | POA: Diagnosis not present

## 2014-09-03 ENCOUNTER — Other Ambulatory Visit: Payer: Self-pay | Admitting: Internal Medicine

## 2014-09-06 DIAGNOSIS — J3089 Other allergic rhinitis: Secondary | ICD-10-CM | POA: Diagnosis not present

## 2014-09-06 DIAGNOSIS — J301 Allergic rhinitis due to pollen: Secondary | ICD-10-CM | POA: Diagnosis not present

## 2014-09-13 DIAGNOSIS — J3089 Other allergic rhinitis: Secondary | ICD-10-CM | POA: Diagnosis not present

## 2014-09-13 DIAGNOSIS — J301 Allergic rhinitis due to pollen: Secondary | ICD-10-CM | POA: Diagnosis not present

## 2014-09-20 DIAGNOSIS — J301 Allergic rhinitis due to pollen: Secondary | ICD-10-CM | POA: Diagnosis not present

## 2014-09-20 DIAGNOSIS — J3089 Other allergic rhinitis: Secondary | ICD-10-CM | POA: Diagnosis not present

## 2014-09-27 DIAGNOSIS — J3089 Other allergic rhinitis: Secondary | ICD-10-CM | POA: Diagnosis not present

## 2014-09-27 DIAGNOSIS — J301 Allergic rhinitis due to pollen: Secondary | ICD-10-CM | POA: Diagnosis not present

## 2014-10-03 DIAGNOSIS — J301 Allergic rhinitis due to pollen: Secondary | ICD-10-CM | POA: Diagnosis not present

## 2014-10-03 DIAGNOSIS — J3089 Other allergic rhinitis: Secondary | ICD-10-CM | POA: Diagnosis not present

## 2014-10-06 DIAGNOSIS — J301 Allergic rhinitis due to pollen: Secondary | ICD-10-CM | POA: Diagnosis not present

## 2014-10-06 DIAGNOSIS — J3089 Other allergic rhinitis: Secondary | ICD-10-CM | POA: Diagnosis not present

## 2014-10-11 DIAGNOSIS — J301 Allergic rhinitis due to pollen: Secondary | ICD-10-CM | POA: Diagnosis not present

## 2014-10-11 DIAGNOSIS — J3089 Other allergic rhinitis: Secondary | ICD-10-CM | POA: Diagnosis not present

## 2014-10-12 ENCOUNTER — Other Ambulatory Visit: Payer: Self-pay | Admitting: Internal Medicine

## 2014-10-18 DIAGNOSIS — J3089 Other allergic rhinitis: Secondary | ICD-10-CM | POA: Diagnosis not present

## 2014-10-18 DIAGNOSIS — J301 Allergic rhinitis due to pollen: Secondary | ICD-10-CM | POA: Diagnosis not present

## 2014-10-24 ENCOUNTER — Other Ambulatory Visit: Payer: Self-pay

## 2014-10-24 MED ORDER — PINDOLOL 10 MG PO TABS: 10.0000 mg | ORAL_TABLET | Freq: Every day | ORAL | Status: DC

## 2014-10-25 DIAGNOSIS — J301 Allergic rhinitis due to pollen: Secondary | ICD-10-CM | POA: Diagnosis not present

## 2014-10-25 DIAGNOSIS — J3089 Other allergic rhinitis: Secondary | ICD-10-CM | POA: Diagnosis not present

## 2014-10-27 DIAGNOSIS — J3089 Other allergic rhinitis: Secondary | ICD-10-CM | POA: Diagnosis not present

## 2014-10-27 DIAGNOSIS — J301 Allergic rhinitis due to pollen: Secondary | ICD-10-CM | POA: Diagnosis not present

## 2014-11-01 DIAGNOSIS — J301 Allergic rhinitis due to pollen: Secondary | ICD-10-CM | POA: Diagnosis not present

## 2014-11-01 DIAGNOSIS — J3089 Other allergic rhinitis: Secondary | ICD-10-CM | POA: Diagnosis not present

## 2014-11-07 ENCOUNTER — Other Ambulatory Visit: Payer: Self-pay | Admitting: Internal Medicine

## 2014-11-08 DIAGNOSIS — J3089 Other allergic rhinitis: Secondary | ICD-10-CM | POA: Diagnosis not present

## 2014-11-08 DIAGNOSIS — J301 Allergic rhinitis due to pollen: Secondary | ICD-10-CM | POA: Diagnosis not present

## 2014-11-15 DIAGNOSIS — J301 Allergic rhinitis due to pollen: Secondary | ICD-10-CM | POA: Diagnosis not present

## 2014-11-15 DIAGNOSIS — J3089 Other allergic rhinitis: Secondary | ICD-10-CM | POA: Diagnosis not present

## 2014-11-17 DIAGNOSIS — Z23 Encounter for immunization: Secondary | ICD-10-CM | POA: Diagnosis not present

## 2014-11-22 DIAGNOSIS — J301 Allergic rhinitis due to pollen: Secondary | ICD-10-CM | POA: Diagnosis not present

## 2014-11-22 DIAGNOSIS — J3089 Other allergic rhinitis: Secondary | ICD-10-CM | POA: Diagnosis not present

## 2014-11-29 DIAGNOSIS — J301 Allergic rhinitis due to pollen: Secondary | ICD-10-CM | POA: Diagnosis not present

## 2014-11-29 DIAGNOSIS — J3089 Other allergic rhinitis: Secondary | ICD-10-CM | POA: Diagnosis not present

## 2014-11-30 DIAGNOSIS — H2512 Age-related nuclear cataract, left eye: Secondary | ICD-10-CM | POA: Diagnosis not present

## 2014-12-06 DIAGNOSIS — J3089 Other allergic rhinitis: Secondary | ICD-10-CM | POA: Diagnosis not present

## 2014-12-06 DIAGNOSIS — J301 Allergic rhinitis due to pollen: Secondary | ICD-10-CM | POA: Diagnosis not present

## 2014-12-12 DIAGNOSIS — H2512 Age-related nuclear cataract, left eye: Secondary | ICD-10-CM | POA: Diagnosis not present

## 2014-12-13 ENCOUNTER — Encounter: Payer: Self-pay | Admitting: *Deleted

## 2014-12-13 DIAGNOSIS — J3089 Other allergic rhinitis: Secondary | ICD-10-CM | POA: Diagnosis not present

## 2014-12-13 DIAGNOSIS — J301 Allergic rhinitis due to pollen: Secondary | ICD-10-CM | POA: Diagnosis not present

## 2014-12-14 ENCOUNTER — Other Ambulatory Visit: Payer: Self-pay | Admitting: Internal Medicine

## 2014-12-19 ENCOUNTER — Other Ambulatory Visit: Payer: Self-pay | Admitting: Internal Medicine

## 2014-12-20 ENCOUNTER — Encounter
Admission: RE | Disposition: A | Discharge status: Home / Self Care | Payer: Self-pay | Source: Ambulatory Visit | Attending: Ophthalmology

## 2014-12-20 ENCOUNTER — Ambulatory Visit
Admission: RE | Admit: 2014-12-20 | Discharge: 2014-12-20 | Disposition: A | Discharge status: Home / Self Care | Payer: Medicare Other | Source: Ambulatory Visit | Attending: Ophthalmology | Admitting: Ophthalmology

## 2014-12-20 ENCOUNTER — Encounter: Payer: Self-pay | Admitting: *Deleted

## 2014-12-20 ENCOUNTER — Ambulatory Visit: Payer: Medicare Other | Admitting: Anesthesiology

## 2014-12-20 DIAGNOSIS — N189 Chronic kidney disease, unspecified: Secondary | ICD-10-CM | POA: Diagnosis not present

## 2014-12-20 DIAGNOSIS — T7840XA Allergy, unspecified, initial encounter: Secondary | ICD-10-CM | POA: Diagnosis not present

## 2014-12-20 DIAGNOSIS — Z9849 Cataract extraction status, unspecified eye: Secondary | ICD-10-CM | POA: Insufficient documentation

## 2014-12-20 DIAGNOSIS — M1990 Unspecified osteoarthritis, unspecified site: Secondary | ICD-10-CM | POA: Insufficient documentation

## 2014-12-20 DIAGNOSIS — Z96652 Presence of left artificial knee joint: Secondary | ICD-10-CM | POA: Insufficient documentation

## 2014-12-20 DIAGNOSIS — E7800 Pure hypercholesterolemia, unspecified: Secondary | ICD-10-CM | POA: Insufficient documentation

## 2014-12-20 DIAGNOSIS — Z8673 Personal history of transient ischemic attack (TIA), and cerebral infarction without residual deficits: Secondary | ICD-10-CM | POA: Insufficient documentation

## 2014-12-20 DIAGNOSIS — I129 Hypertensive chronic kidney disease with stage 1 through stage 4 chronic kidney disease, or unspecified chronic kidney disease: Secondary | ICD-10-CM | POA: Diagnosis not present

## 2014-12-20 DIAGNOSIS — Z7901 Long term (current) use of anticoagulants: Secondary | ICD-10-CM | POA: Insufficient documentation

## 2014-12-20 DIAGNOSIS — J45909 Unspecified asthma, uncomplicated: Secondary | ICD-10-CM | POA: Insufficient documentation

## 2014-12-20 DIAGNOSIS — Z79899 Other long term (current) drug therapy: Secondary | ICD-10-CM | POA: Insufficient documentation

## 2014-12-20 DIAGNOSIS — I10 Essential (primary) hypertension: Secondary | ICD-10-CM | POA: Diagnosis not present

## 2014-12-20 DIAGNOSIS — I4891 Unspecified atrial fibrillation: Secondary | ICD-10-CM | POA: Insufficient documentation

## 2014-12-20 DIAGNOSIS — H9193 Unspecified hearing loss, bilateral: Secondary | ICD-10-CM | POA: Insufficient documentation

## 2014-12-20 DIAGNOSIS — H269 Unspecified cataract: Secondary | ICD-10-CM | POA: Diagnosis present

## 2014-12-20 DIAGNOSIS — H2512 Age-related nuclear cataract, left eye: Secondary | ICD-10-CM | POA: Diagnosis not present

## 2014-12-20 DIAGNOSIS — K219 Gastro-esophageal reflux disease without esophagitis: Secondary | ICD-10-CM | POA: Insufficient documentation

## 2014-12-20 HISTORY — PX: CATARACT EXTRACTION W/PHACO: SHX586

## 2014-12-20 HISTORY — DX: Unspecified hearing loss, unspecified ear: H91.90

## 2014-12-20 SURGERY — PHACOEMULSIFICATION, CATARACT, WITH IOL INSERTION
Anesthesia: Monitor Anesthesia Care | Site: Eye | Laterality: Left | Wound class: Clean

## 2014-12-20 MED ORDER — POVIDONE-IODINE 5 % OP SOLN
OPHTHALMIC | Status: AC
  Administered 2014-12-20: 1 via OPHTHALMIC
  Filled 2014-12-20: qty 30

## 2014-12-20 MED ORDER — NA CHONDROIT SULF-NA HYALURON 40-17 MG/ML IO SOLN
INTRAOCULAR | Status: DC | PRN
  Administered 2014-12-20: 1 mL via INTRAOCULAR

## 2014-12-20 MED ORDER — CARBACHOL 0.01 % IO SOLN
INTRAOCULAR | Status: DC | PRN
Start: 2014-12-20 — End: 2014-12-20
  Administered 2014-12-20: .5 mL via INTRAOCULAR

## 2014-12-20 MED ORDER — TETRACAINE HCL 0.5 % OP SOLN
OPHTHALMIC | Status: AC
  Administered 2014-12-20: 1 [drp] via OPHTHALMIC
  Filled 2014-12-20: qty 2

## 2014-12-20 MED ORDER — ARMC OPHTHALMIC DILATING GEL
OPHTHALMIC | Status: AC
  Administered 2014-12-20: 1 via OPHTHALMIC
  Filled 2014-12-20: qty 0.25

## 2014-12-20 MED ORDER — MIDAZOLAM HCL 2 MG/2ML IJ SOLN
INTRAMUSCULAR | Status: DC | PRN
  Administered 2014-12-20 (×2): 1 mg via INTRAVENOUS

## 2014-12-20 MED ORDER — MOXIFLOXACIN HCL 0.5 % OP SOLN
OPHTHALMIC | Status: DC | PRN
  Administered 2014-12-20: 1 [drp] via OPHTHALMIC

## 2014-12-20 MED ORDER — CEFUROXIME OPHTHALMIC INJECTION 1 MG/0.1 ML
INJECTION | OPHTHALMIC | Status: AC
  Filled 2014-12-20: qty 0.1

## 2014-12-20 MED ORDER — MOXIFLOXACIN HCL 0.5 % OP SOLN: 1.0000 [drp] | OPHTHALMIC | Status: DC | PRN

## 2014-12-20 MED ORDER — CEFUROXIME OPHTHALMIC INJECTION 1 MG/0.1 ML
INJECTION | OPHTHALMIC | Status: DC | PRN
  Administered 2014-12-20: .1 mL via INTRACAMERAL

## 2014-12-20 MED ORDER — MOXIFLOXACIN HCL 0.5 % OP SOLN
OPHTHALMIC | Status: AC
  Filled 2014-12-20: qty 3

## 2014-12-20 MED ORDER — NA CHONDROIT SULF-NA HYALURON 40-17 MG/ML IO SOLN
INTRAOCULAR | Status: AC
  Filled 2014-12-20: qty 1

## 2014-12-20 MED ORDER — SODIUM CHLORIDE 0.9 % IV SOLN
INTRAVENOUS | Status: DC
  Administered 2014-12-20: 09:00:00 via INTRAVENOUS

## 2014-12-20 MED ORDER — EPINEPHRINE HCL 1 MG/ML IJ SOLN
INTRAMUSCULAR | Status: AC
  Filled 2014-12-20: qty 1

## 2014-12-20 MED ORDER — BSS IO SOLN
INTRAOCULAR | Status: DC | PRN
  Administered 2014-12-20: 1 mL via OPHTHALMIC

## 2014-12-20 MED ORDER — TETRACAINE HCL 0.5 % OP SOLN
1.0000 [drp] | OPHTHALMIC | Status: AC | PRN
  Administered 2014-12-20: 1 [drp] via OPHTHALMIC

## 2014-12-20 MED ORDER — ARMC OPHTHALMIC DILATING GEL
1.0000 "application " | OPHTHALMIC | Status: DC | PRN
  Administered 2014-12-20: 1 via OPHTHALMIC

## 2014-12-20 MED ORDER — POVIDONE-IODINE 5 % OP SOLN
1.0000 "application " | OPHTHALMIC | Status: AC | PRN
  Administered 2014-12-20: 1 via OPHTHALMIC

## 2014-12-20 SURGICAL SUPPLY — 23 items
CANNULA ANT/CHMB 27G (MISCELLANEOUS) ×1 IMPLANT
CANNULA ANT/CHMB 27GA (MISCELLANEOUS) ×2 IMPLANT
CUP MEDICINE 2OZ PLAST GRAD ST (MISCELLANEOUS) ×2 IMPLANT
GLOVE BIO SURGEON STRL SZ8 (GLOVE) ×2 IMPLANT
GLOVE BIOGEL M 6.5 STRL (GLOVE) ×2 IMPLANT
GLOVE SURG LX 8.0 MICRO (GLOVE) ×1
GLOVE SURG LX STRL 8.0 MICRO (GLOVE) ×1 IMPLANT
GOWN STRL REUS W/ TWL LRG LVL3 (GOWN DISPOSABLE) ×2 IMPLANT
GOWN STRL REUS W/TWL LRG LVL3 (GOWN DISPOSABLE) ×4
LENS IOL TECNIS 20.0 (Intraocular Lens) ×2 IMPLANT
LENS IOL TECNIS MONO 1P 20.0 (Intraocular Lens) IMPLANT
PACK CATARACT (MISCELLANEOUS) ×2 IMPLANT
PACK CATARACT BRASINGTON LX (MISCELLANEOUS) ×2 IMPLANT
PACK EYE AFTER SURG (MISCELLANEOUS) ×2 IMPLANT
SOL BSS BAG (MISCELLANEOUS) ×2
SOL PREP PVP 2OZ (MISCELLANEOUS) ×2
SOLUTION BSS BAG (MISCELLANEOUS) ×1 IMPLANT
SOLUTION PREP PVP 2OZ (MISCELLANEOUS) ×1 IMPLANT
SYR 3ML LL SCALE MARK (SYRINGE) ×2 IMPLANT
SYR 5ML LL (SYRINGE) ×2 IMPLANT
SYR TB 1ML 27GX1/2 LL (SYRINGE) ×2 IMPLANT
WATER STERILE IRR 1000ML POUR (IV SOLUTION) ×2 IMPLANT
WIPE NON LINTING 3.25X3.25 (MISCELLANEOUS) ×2 IMPLANT

## 2014-12-20 NOTE — Discharge Instructions (Signed)
Eye Surgery Discharge Instructions  Expect mild scratchy sensation or mild soreness. DO NOT RUB YOUR EYE!  The day of surgery:  Minimal physical activity, but bed rest is not required  No reading, computer work, or close hand work  No bending, lifting, or straining.  May watch TV  For 24 hours:  No driving, legal decisions, or alcoholic beverages  Safety precautions  Eat anything you prefer: It is better to start with liquids, then soup then solid foods.  _____ Eye patch should be worn until postoperative exam tomorrow.  ____ Solar shield eyeglasses should be worn for comfort in the sunlight/patch while sleeping  Resume all regular medications including aspirin or Coumadin if these were discontinued prior to surgery. You may shower, bathe, shave, or wash your hair. Tylenol may be taken for mild discomfort.  Call your doctor if you experience significant pain, nausea, or vomiting, fever > 101 or other signs of infection. [phone] or [phone] Specific instructions:  Follow-up Information    Follow up with Tim Lair, MD On 12/21/2014.   Specialty:  Ophthalmology   Why:  10:35   Contact information:   [address] [place] [address] [phone]

## 2014-12-20 NOTE — Anesthesia Postprocedure Evaluation (Signed)
Anesthesia Post Note  Patient: Todd Golden  Procedure(s) Performed: Procedure(s) (LRB): CATARACT EXTRACTION PHACO AND INTRAOCULAR LENS PLACEMENT (IOC) (Left)  Patient location during evaluation: Other Anesthesia Type: MAC Level of consciousness: awake and alert Pain management: pain level controlled Vital Signs Assessment: post-procedure vital signs reviewed and stable Cardiovascular status: stable Anesthetic complications: no    Last Vitals:  Filed Vitals:   12/20/14 0946 12/20/14 1013  BP: 133/79 151/76  Pulse: 59 58  Temp: 36.8 C   Resp: 16 16    Last Pain:  Filed Vitals:   12/20/14 1018  PainSc: [place]

## 2014-12-20 NOTE — Op Note (Signed)
PREOPERATIVE DIAGNOSIS:  Nuclear sclerotic cataract of the left eye.   POSTOPERATIVE DIAGNOSIS:  nuclear sclerotic cataract left eye   OPERATIVE PROCEDURE:  Procedure(s): CATARACT EXTRACTION PHACO AND INTRAOCULAR LENS PLACEMENT (IOC)   SURGEON:  Birder Robson, MD.   ANESTHESIA:   Anesthesiologist: Gunnar Bulla, MD CRNA: Johnna Acosta, CRNA  1.      Managed anesthesia care. 2.      Topical tetracaine drops followed by 2% Xylocaine jelly applied in the preoperative holding area.   COMPLICATIONS:  None.   TECHNIQUE:   Stop and chop   DESCRIPTION OF PROCEDURE:  The patient was examined and consented in the preoperative holding area where the aforementioned topical anesthesia was applied to the left eye and then brought back to the Operating Room where the left eye was prepped and draped in the usual sterile ophthalmic fashion and a lid speculum was placed. A paracentesis was created with the side port blade and the anterior chamber was filled with viscoelastic. A near clear corneal incision was performed with the steel keratome. A continuous curvilinear capsulorrhexis was performed with a cystotome followed by the capsulorrhexis forceps. Hydrodissection and hydrodelineation were carried out with BSS on a blunt cannula. The lens was removed in a stop and chop  technique and the remaining cortical material was removed with the irrigation-aspiration handpiece. The capsular bag was inflated with viscoelastic and the Technis ZCB00 lens was placed in the capsular bag without complication. The remaining viscoelastic was removed from the eye with the irrigation-aspiration handpiece. The wounds were hydrated. The anterior chamber was flushed with Miostat and the eye was inflated to physiologic pressure. 0.1 mL of cefuroxime concentration 10 mg/mL was placed in the anterior chamber. The wounds were found to be water tight. The eye was dressed with Vigamox. The patient was given protective glasses to  wear throughout the day and a shield with which to sleep tonight. The patient was also given drops with which to begin a drop regimen today and will follow-up with me in one day.  Implant Name Type Inv. Item Serial No. Manufacturer Lot No. LRB No. Used  LENS IMPL INTRAOC ZCB00 20.0 - ID:[id number] Intraocular Lens LENS IMPL INTRAOC ZCB00 20.0 [id number]   Left 1   Procedure(s) with comments: CATARACT EXTRACTION PHACO AND INTRAOCULAR LENS PLACEMENT (IOC) (Left) -  [place]     00:59.6 AP%    21.2 CDE    12.66 fluid pack lot [id number]  Electronically signed: [place] 12/20/2014 9:43 AM

## 2014-12-20 NOTE — Anesthesia Procedure Notes (Signed)
Procedure Name: MAC Date/Time: 12/20/2014 9:23 AM Performed by: Johnna Acosta Pre-anesthesia Checklist: Patient identified, Emergency Drugs available, Suction available, Patient being monitored and Timeout performed Patient Re-evaluated:Patient Re-evaluated prior to inductionOxygen Delivery Method: Nasal cannula

## 2014-12-20 NOTE — H&P (Signed)
  All labs reviewed. Abnormal studies sent to patients PCP when indicated.  Previous H&P reviewed, patient examined, there are NO CHANGES.  Todd Golden LOUIS11/29/20169:14 AM

## 2014-12-20 NOTE — Anesthesia Preprocedure Evaluation (Addendum)
Anesthesia Evaluation  Patient identified by MRN, date of birth, ID band Patient awake    Reviewed: Allergy & Precautions, NPO status , Patient's Chart, lab work & pertinent test results, reviewed documented beta blocker date and time   Airway Mallampati: II  TM Distance: >3 FB     Dental  (+) Chipped   Pulmonary asthma ,           Cardiovascular hypertension, Pt. on medications and Pt. on home beta blockers      Neuro/Psych CVA, No Residual Symptoms    GI/Hepatic GERD  Controlled,  Endo/Other    Renal/GU Renal disease     Musculoskeletal  (+) Arthritis ,   Abdominal   Peds  Hematology   Anesthesia Other Findings Hx of A fib. TIA - no stroke. Had kidney stent in the past - no stent now.   Reproductive/Obstetrics                            Anesthesia Physical Anesthesia Plan  ASA: III  Anesthesia Plan: MAC   Post-op Pain Management:    Induction:   Airway Management Planned:   Additional Equipment:   Intra-op Plan:   Post-operative Plan:   Informed Consent: I have reviewed the patients History and Physical, chart, labs and discussed the procedure including the risks, benefits and alternatives for the proposed anesthesia with the patient or authorized representative who has indicated his/her understanding and acceptance.     Plan Discussed with: CRNA  Anesthesia Plan Comments:         Anesthesia Quick Evaluation

## 2014-12-20 NOTE — Transfer of Care (Signed)
Immediate Anesthesia Transfer of Care Note  Patient: Todd Golden  Procedure(s) Performed: Procedure(s) with comments: CATARACT EXTRACTION PHACO AND INTRAOCULAR LENS PLACEMENT (IOC) (Left) -  [place]     00:59.6 AP%    21.2 CDE    12.66 fluid pack lot [id number]  Patient Location: PACU  Anesthesia Type:MAC  Level of Consciousness: awake, alert  and oriented  Airway & Oxygen Therapy: Patient Spontanous Breathing  Post-op Assessment: Report given to RN and Post -op Vital signs reviewed and stable  Post vital signs: Reviewed  Last Vitals:  Filed Vitals:   12/20/14 0824  BP: 135/87  Pulse: 63  Temp: 36.7 C  Resp: 16    Complications: No apparent anesthesia complications

## 2014-12-22 DIAGNOSIS — J301 Allergic rhinitis due to pollen: Secondary | ICD-10-CM | POA: Diagnosis not present

## 2014-12-22 DIAGNOSIS — J3089 Other allergic rhinitis: Secondary | ICD-10-CM | POA: Diagnosis not present

## 2014-12-27 DIAGNOSIS — J301 Allergic rhinitis due to pollen: Secondary | ICD-10-CM | POA: Diagnosis not present

## 2014-12-27 DIAGNOSIS — J3089 Other allergic rhinitis: Secondary | ICD-10-CM | POA: Diagnosis not present

## 2015-01-02 ENCOUNTER — Ambulatory Visit (INDEPENDENT_AMBULATORY_CARE_PROVIDER_SITE_OTHER): Payer: Medicare Other | Admitting: Cardiovascular Disease

## 2015-01-02 ENCOUNTER — Encounter: Payer: Self-pay | Admitting: Cardiovascular Disease

## 2015-01-02 VITALS — BP 104/66 | HR 58 | Ht 70.0 in | Wt 207.5 lb

## 2015-01-02 DIAGNOSIS — I4891 Unspecified atrial fibrillation: Secondary | ICD-10-CM | POA: Diagnosis not present

## 2015-01-02 DIAGNOSIS — I10 Essential (primary) hypertension: Secondary | ICD-10-CM

## 2015-01-02 DIAGNOSIS — I63319 Cerebral infarction due to thrombosis of unspecified middle cerebral artery: Secondary | ICD-10-CM | POA: Diagnosis not present

## 2015-01-02 MED ORDER — APIXABAN 5 MG PO TABS: ORAL_TABLET | ORAL | Status: DC

## 2015-01-02 MED ORDER — DOXAZOSIN MESYLATE 4 MG PO TABS: 4.0000 mg | ORAL_TABLET | Freq: Every day | ORAL | Status: DC

## 2015-01-02 NOTE — Progress Notes (Signed)
Primary care physician: Dr. Derrel Nip  HPI  Todd Golden is a 81 y.o. male who is here today for a follow-up visit regarding paroxysmal atrial fibrillation. 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 Echocardiogram showed normal LV systolic function with mildly dilated left atrium.  He was noted during his initial evaluation to be in atrial fibrillation and was started on Eliquis.  He has been doing very well and denies any chest pain, shortness of breath or palpitations. He is noted to have relatively low blood pressure today but denies any dizziness. He is on multiple blood pressure medications. He had cataract surgery done recently with no complications.  No Known Allergies   Current Outpatient Prescriptions on File Prior to Visit  Medication Sig Dispense Refill  . amLODipine (NORVASC) 5 MG tablet TAKE 1 TABLET (5 MG TOTAL) BY MOUTH DAILY. 90 tablet 1  . doxazosin (CARDURA) 8 MG tablet TAKE 1 TABLET EVERY DAY 30 tablet 0  . ELIQUIS 5 MG TABS tablet TAKE 1 TABLET (5 MG TOTAL) BY MOUTH 2 (TWO) TIMES DAILY. 60 tablet 6  . EPIPEN 2-PAK 0.3 MG/0.3ML SOAJ injection Inject 2 mLs into the muscle as needed.    . latanoprost (XALATAN) 0.005 % ophthalmic solution Place 1 drop into both eyes at bedtime.    . Multiple Vitamins-Minerals (PRESERVISION AREDS 2 PO) Take by mouth daily.    Marland Kitchen omeprazole (PRILOSEC) 40 MG capsule Take 1 capsule (40 mg total) by mouth daily. 90 capsule 3  . pindolol (VISKEN) 10 MG tablet Take 1 tablet (10 mg total) by mouth daily. 90 tablet 1  . simvastatin (ZOCOR) 20 MG tablet TAKE 1 TABLET (20 MG TOTAL) BY MOUTH DAILY. 90 tablet 1   No current facility-administered medications on file prior to visit.     Past Medical History  Diagnosis Date  . Asthma   . Arthritis   . Glaucoma   . Allergy   .  Hypertension   . Colon polyps   . Chronic kidney disease     stent  . Stroke ([place])   . Hyperlipidemia   . Chronic constipation   . Cough   . Cataract   . Ureteral stricture   . GERD (gastroesophageal reflux disease)   . HOH (hard of hearing)     AIDS     Past Surgical History  Procedure Laterality Date  . Joint replacement Left 2014    knee partial replacement  . Kidney stent Right 1995  . Cataract extraction w/phaco Left 12/20/2014    Procedure: CATARACT EXTRACTION PHACO AND INTRAOCULAR LENS PLACEMENT (IOC);  Surgeon: Birder Robson, MD;  Location: [facility] ORS;  Service: Ophthalmology;  Laterality: Left;   [place]     00:59.6 AP%    21.2 CDE    12.66 fluid pack lot [id number]     Family History  Problem Relation Age of Onset  . Arthritis Mother   . Cancer Mother 34    kidney  . Stroke Father      Social History   Social History  . Marital Status: Married    Spouse Name: N/A  . Number of Children: N/A  . Years of Education: N/A   Occupational History  . Not on file.   Social History Main Topics  . Smoking status: Never Smoker   . Smokeless tobacco: Never Used  . Alcohol Use:  Yes  . Drug Use: No  . Sexual Activity: Not Currently   Other Topics Concern  . Not on file   Social History Narrative     ROS A 10 point review of system was performed. It is negative other than that mentioned in the history of present illness.   PHYSICAL EXAM   BP 104/66 mmHg  Pulse 58  Ht 5\' 10"  (1.778 m)  Wt 207 lb 8 oz (94.121 kg)  BMI 29.77 kg/m2 Constitutional: He is oriented to person, place, and time. He appears well-developed and well-nourished. No distress.  HENT: No nasal discharge.  Head: Normocephalic and atraumatic.  Eyes: Pupils are equal and round.  No discharge. Neck: Normal range of motion. Neck supple. No JVD present. No thyromegaly present.  Cardiovascular: Normal rate, irregular rhythm, normal heart sounds. Exam reveals no gallop and no friction rub.  No murmur heard.  Pulmonary/Chest: Effort normal and breath sounds normal. No stridor. No respiratory distress. He has no wheezes. He has no rales. He exhibits no tenderness.  Abdominal: Soft. Bowel sounds are normal. He exhibits no distension. There is no tenderness. There is no rebound and no guarding.  Musculoskeletal: Normal range of motion. He exhibits no edema and no tenderness.  Neurological: He is alert and oriented to person, place, and time. Coordination normal.  Skin: Skin is warm and dry. No rash noted. He is not diaphoretic. No erythema. No pallor.  Psychiatric: He has a normal mood and affect. His behavior is normal. Judgment and thought content normal.       EKG: Sinus bradycardia with minimal LVH criteria.   ASSESSMENT AND PLAN

## 2015-01-02 NOTE — Assessment & Plan Note (Signed)
The patient is maintaining in sinus rhythm with no evidence of recurrent arrhythmia. He is tolerating anticoagulation with no side effects.

## 2015-01-02 NOTE — Patient Instructions (Signed)
Medication Instructions:  Your physician has recommended you make the following change in your medication:  DECREASE cardura to 4mg  one per day   Labwork: none  Testing/Procedures: none  Follow-Up: Your physician wants you to follow-up in: six months with Dr. Fletcher Anon.  You will receive a reminder letter in the mail two months in advance. If you don't receive a letter, please call our office to schedule the follow-up appointment.   Any Other Special Instructions Will Be Listed Below (If Applicable).     If you need a refill on your cardiac medications before your next appointment, please call your pharmacy.

## 2015-01-02 NOTE — Assessment & Plan Note (Signed)
He is on 4 different blood pressure medications and given his age he is at risk for orthostatic hypotension. Thus, I decreased the dose of Doxazosin 4 mg once daily. He does not have significant BPH symptoms.

## 2015-01-03 DIAGNOSIS — J301 Allergic rhinitis due to pollen: Secondary | ICD-10-CM | POA: Diagnosis not present

## 2015-01-03 DIAGNOSIS — J3089 Other allergic rhinitis: Secondary | ICD-10-CM | POA: Diagnosis not present

## 2015-01-10 DIAGNOSIS — J301 Allergic rhinitis due to pollen: Secondary | ICD-10-CM | POA: Diagnosis not present

## 2015-01-10 DIAGNOSIS — J3089 Other allergic rhinitis: Secondary | ICD-10-CM | POA: Diagnosis not present

## 2015-01-17 DIAGNOSIS — J3089 Other allergic rhinitis: Secondary | ICD-10-CM | POA: Diagnosis not present

## 2015-01-17 DIAGNOSIS — J301 Allergic rhinitis due to pollen: Secondary | ICD-10-CM | POA: Diagnosis not present

## 2015-01-24 DIAGNOSIS — J301 Allergic rhinitis due to pollen: Secondary | ICD-10-CM | POA: Diagnosis not present

## 2015-01-24 DIAGNOSIS — J3089 Other allergic rhinitis: Secondary | ICD-10-CM | POA: Diagnosis not present

## 2015-01-25 DIAGNOSIS — H353290 Exudative age-related macular degeneration, unspecified eye, stage unspecified: Secondary | ICD-10-CM | POA: Diagnosis not present

## 2015-01-30 ENCOUNTER — Other Ambulatory Visit: Payer: Self-pay

## 2015-01-30 MED ORDER — HYDROCHLOROTHIAZIDE 25 MG PO TABS: 25.0000 mg | ORAL_TABLET | Freq: Every day | ORAL | Status: DC

## 2015-01-31 DIAGNOSIS — J301 Allergic rhinitis due to pollen: Secondary | ICD-10-CM | POA: Diagnosis not present

## 2015-01-31 DIAGNOSIS — J3089 Other allergic rhinitis: Secondary | ICD-10-CM | POA: Diagnosis not present

## 2015-02-07 DIAGNOSIS — J301 Allergic rhinitis due to pollen: Secondary | ICD-10-CM | POA: Diagnosis not present

## 2015-02-07 DIAGNOSIS — J3089 Other allergic rhinitis: Secondary | ICD-10-CM | POA: Diagnosis not present

## 2015-02-08 ENCOUNTER — Encounter: Payer: Self-pay | Admitting: Internal Medicine

## 2015-02-08 ENCOUNTER — Ambulatory Visit: Payer: Medicare Other

## 2015-02-08 ENCOUNTER — Ambulatory Visit (INDEPENDENT_AMBULATORY_CARE_PROVIDER_SITE_OTHER): Payer: Medicare Other | Admitting: Internal Medicine

## 2015-02-08 VITALS — BP 130/80 | HR 66 | Temp 98.2°F | Resp 12 | Ht 70.0 in | Wt 203.4 lb

## 2015-02-08 DIAGNOSIS — N135 Crossing vessel and stricture of ureter without hydronephrosis: Secondary | ICD-10-CM | POA: Diagnosis not present

## 2015-02-08 DIAGNOSIS — M25551 Pain in right hip: Secondary | ICD-10-CM

## 2015-02-08 DIAGNOSIS — R351 Nocturia: Secondary | ICD-10-CM

## 2015-02-08 DIAGNOSIS — Z23 Encounter for immunization: Secondary | ICD-10-CM

## 2015-02-08 DIAGNOSIS — N401 Enlarged prostate with lower urinary tract symptoms: Secondary | ICD-10-CM

## 2015-02-08 DIAGNOSIS — I10 Essential (primary) hypertension: Secondary | ICD-10-CM | POA: Diagnosis not present

## 2015-02-08 LAB — COMPREHENSIVE METABOLIC PANEL
ALT: 25 U/L (ref 0–53)
AST: 36 U/L (ref 0–37)
Albumin: 4.2 g/dL (ref 3.5–5.2)
Alkaline Phosphatase: 56 U/L (ref 39–117)
BUN: 17 mg/dL (ref 6–23)
CO2: 31 mEq/L (ref 19–32)
Calcium: 9.7 mg/dL (ref 8.4–10.5)
Chloride: 102 mEq/L (ref 96–112)
Creatinine, Ser: 1.11 mg/dL (ref 0.40–1.50)
GFR: 67.31 mL/min (ref >60.00)
Glucose, Bld: 90 mg/dL (ref 70–99)
Potassium: 4 mEq/L (ref 3.5–5.1)
Sodium: 140 mEq/L (ref 135–145)
Total Bilirubin: 0.7 mg/dL (ref 0.2–1.2)
Total Protein: 7.6 g/dL (ref 6.0–8.3)

## 2015-02-08 NOTE — Progress Notes (Signed)
Pre-visit discussion using our clinic review tool. No additional management support is needed unless otherwise documented below in the visit note.  

## 2015-02-08 NOTE — Progress Notes (Signed)
Subjective:  Patient ID: Todd Golden, male    DOB: 1930/10/09  Age: 82 y.o. MRN: [id number]  CC: The primary encounter diagnosis was Hip pain, acute, right. Diagnoses of Essential hypertension, Need for prophylactic vaccination against Streptococcus pneumoniae (pneumococcus), Essential hypertension, benign, Ureteral stricture, right, Right hip pain, and BPH associated with nocturia were also pertinent to this visit.  HPI Todd Golden presents for follow up  On chronic conditions.     Nocturia x 3. Despite cardrua 4 mg daily . Has epsiodes of right sided flank fullness and discomfort relieved with a large void. 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  No pain until he is walking .  History of knee replacement , makes noise with walking  Hypertension: patient is taking his medications as directed.  No side effects from medications.  Home bps have been < 130/80 at home.    Outpatient Prescriptions Prior to Visit  Medication Sig Dispense Refill  . amLODipine (NORVASC) 5 MG tablet TAKE 1 TABLET (5 MG TOTAL) BY MOUTH DAILY. 90 tablet 1  . apixaban (ELIQUIS) 5 MG TABS tablet TAKE 1 TABLET (5 MG TOTAL) BY MOUTH 2 (TWO) TIMES DAILY. 60 tablet 6  . doxazosin (CARDURA) 4 MG tablet Take 1 tablet (4 mg total) by mouth daily. 30 tablet 6  . EPIPEN 2-PAK 0.3 MG/0.3ML SOAJ injection Inject 2 mLs into the muscle as needed.    . hydrochlorothiazide (HYDRODIURIL) 25 MG tablet Take 1 tablet (25 mg total) by mouth daily. 90 tablet 1  . latanoprost (XALATAN) 0.005 % ophthalmic solution Place 1 drop into both eyes at bedtime.    . Multiple Vitamins-Minerals (PRESERVISION AREDS 2 PO) Take by mouth daily.    Marland Kitchen omeprazole (PRILOSEC) 40 MG capsule Take 1 capsule (40 mg total) by  mouth daily. 90 capsule 3  . pindolol (VISKEN) 10 MG tablet Take 1 tablet (10 mg total) by mouth daily. 90 tablet 1  . simvastatin (ZOCOR) 20 MG tablet TAKE 1 TABLET (20 MG TOTAL) BY MOUTH DAILY. 90 tablet 1   No facility-administered medications prior to visit.    Review of Systems;  Patient denies headache, fevers, malaise, unintentional weight loss, skin rash, eye pain, sinus congestion and sinus pain, sore throat, dysphagia,  hemoptysis , cough, dyspnea, wheezing, chest pain, palpitations, orthopnea, edema, abdominal pain, nausea, melena, diarrhea, constipation, flank pain, dysuria, hematuria, urinary  Frequency, nocturia, numbness, tingling, seizures,  Focal weakness, Loss of consciousness,  Tremor, insomnia, depression, anxiety, and suicidal ideation.      Objective:  BP 130/80 mmHg  Pulse 66  Temp(Src) 98.2 F (36.8 C) (Oral)  Resp 12  Ht 5\' 10"  (1.778 m)  Wt 203 lb 6 oz (92.25 kg)  BMI 29.18 kg/m2  SpO2 96%  BP Readings from Last 3 Encounters:  02/10/15 130/70  02/08/15 130/80  01/02/15 104/66    Wt Readings from Last 3 Encounters:  02/10/15 203 lb 9.6 oz (92.352 kg)  02/08/15 203 lb 6 oz (92.25 kg)  01/02/15 207 lb 8 oz (94.121 kg)    General appearance: alert, cooperative and appears stated age Ears: normal TM's and external ear canals both ears Throat: lips, mucosa, and tongue normal; teeth and gums normal Neck: no adenopathy, no carotid bruit, supple, symmetrical,  trachea midline and thyroid not enlarged, symmetric, no tenderness/mass/nodules Back: symmetric, no curvature. ROM normal. No CVA tenderness. Lungs: clear to auscultation bilaterally Heart: regular rate and rhythm, S1, S2 normal, no murmur, click, rub or gallop Abdomen: soft, non-tender; bowel sounds normal; no masses,  no organomegaly Pulses: 2+ and symmetric Skin: Skin color, texture, turgor normal. No rashes or lesions Lymph nodes: Cervical, supraclavicular, and axillary nodes normal.  Lab  Results  Component Value Date   HGBA1C 5.9 12/04/2013   HGBA1C 5.9 12/03/2013    Lab Results  Component Value Date   CREATININE 1.11 02/08/2015   CREATININE 1.04 07/27/2014   CREATININE 1.3 01/19/2014    Lab Results  Component Value Date   WBC 5.5 07/27/2014   HGB 13.5 07/27/2014   HCT 40.4 07/27/2014   PLT 231.0 07/27/2014   GLUCOSE 90 02/08/2015   CHOL 151 07/27/2014   TRIG 86.0 07/27/2014   HDL 49.80 07/27/2014   LDLCALC 84 07/27/2014   ALT 25 02/08/2015   AST 36 02/08/2015   NA 140 02/08/2015   K 4.0 02/08/2015   CL 102 02/08/2015   CREATININE 1.11 02/08/2015   BUN 17 02/08/2015   CO2 31 02/08/2015   TSH 1.55 07/27/2014   PSA 2.97 10/21/2012   HGBA1C 5.9 12/04/2013    No results found.  Assessment & Plan:   Problem List Items Addressed This Visit    Essential hypertension, benign    Well controlled on current regimen. Renal function stable, no changes toda  Lab Results  Component Value Date   CREATININE 1.11 02/08/2015   Lab Results  Component Value Date   NA 140 02/08/2015   K 4.0 02/08/2015   CL 102 02/08/2015   CO2 31 02/08/2015         Ureteral stricture, right    With chronic right hydronephrosis by prior imaging,  Infrequent  episodes of flank pain described,  Aggravated by large meals and decreased hydration,  Relieved by drinking cranberry juice and relaxing         Right hip pain    Plain films ordered to evaluate hip joint. Advised to pre treat with tylenol       BPH associated with nocturia    Offered additional therapy , which he has deferred.        Other Visit Diagnoses    Hip pain, acute, right    -  Primary    Relevant Orders    DG HIP UNILAT WITH PELVIS 2-3 VIEWS RIGHT    Essential hypertension        Relevant Orders    Comprehensive metabolic panel (Completed)    Need for prophylactic vaccination against Streptococcus pneumoniae (pneumococcus)        Relevant Orders    Pneumococcal polysaccharide vaccine  23-valent greater than or equal to 2yo subcutaneous/IM (Completed)      A total of 25 minutes of face to face time was spent with patient more than half of which was spent in counselling about the above mentioned conditions  and coordination of care  I am having Todd Golden maintain his latanoprost, EPIPEN 2-PAK, Multiple Vitamins-Minerals (PRESERVISION AREDS 2 PO), simvastatin, omeprazole, pindolol, amLODipine, doxazosin, apixaban, and hydrochlorothiazide.  No orders of the defined types were placed in this encounter.    There are no discontinued medications.  Follow-up: Return in about 4 weeks (around 03/08/2015) for wellness with Denisa.   Crecencio Mc, MD

## 2015-02-08 NOTE — Patient Instructions (Addendum)
I have ordered Plain films of  Your right hip to evaluate the joint.  You do not need an appointment,  Just go to [facility] or to [facility] during business hours.    Try taking 2 tylenol before you go for your walk  Avoid daily Aleve but ok to use 2-3/week  Your blood pressure is fine

## 2015-02-10 ENCOUNTER — Ambulatory Visit (INDEPENDENT_AMBULATORY_CARE_PROVIDER_SITE_OTHER): Payer: Medicare Other

## 2015-02-10 VITALS — BP 130/70 | HR 68 | Temp 98.2°F | Resp 14 | Ht 69.5 in | Wt 203.6 lb

## 2015-02-10 DIAGNOSIS — Z0000 Encounter for general adult medical examination without abnormal findings: Secondary | ICD-10-CM

## 2015-02-10 DIAGNOSIS — H353211 Exudative age-related macular degeneration, right eye, with active choroidal neovascularization: Secondary | ICD-10-CM | POA: Diagnosis not present

## 2015-02-10 NOTE — Progress Notes (Signed)
  I have reviewed the above information and agree with above.   Amarianna Abplanalp, MD 

## 2015-02-10 NOTE — Progress Notes (Signed)
Subjective:   Todd Golden is a 82 y.o. male who presents for Medicare Annual/Subsequent preventive examination.  Review of Systems:  No ROS.  Medicare Wellness Visit.  Cardiac Risk Factors include: hypertension;male gender;advanced age (>24men, >13 women)     Objective:    Vitals: BP 130/70 mmHg  Pulse 68  Temp(Src) 98.2 F (36.8 C) (Oral)  Resp 14  Ht 5' 9.5" (1.765 m)  Wt 203 lb 9.6 oz (92.352 kg)  BMI 29.65 kg/m2  SpO2 97%  Tobacco History  Smoking status  . Never Smoker   Smokeless tobacco  . Never Used     Counseling given: Not Answered   Past Medical History  Diagnosis Date  . Asthma   . Arthritis   . Glaucoma   . Allergy   . Hypertension   . Colon polyps   . Chronic kidney disease     stent  . Stroke ([place])   . Hyperlipidemia   . Chronic constipation   . Cough   . Cataract   . Ureteral stricture   . GERD (gastroesophageal reflux disease)   . HOH (hard of hearing)     AIDS   Past Surgical History  Procedure Laterality Date  . Joint replacement Left 2014    knee partial replacement  . Kidney stent Right 1995  . Cataract extraction w/phaco Left 12/20/2014    Procedure: CATARACT EXTRACTION PHACO AND INTRAOCULAR LENS PLACEMENT (IOC);  Surgeon: Birder Robson, MD;  Location: [facility] ORS;  Service: Ophthalmology;  Laterality: Left;   [place]     00:59.6 AP%    21.2 CDE    12.66 fluid pack lot [id number]   Family History  Problem Relation Age of Onset  . Arthritis Mother   . Cancer Mother 74    kidney  . Stroke Father    History  Sexual Activity  . Sexual Activity: Not Currently    Outpatient Encounter Prescriptions as of 02/10/2015  Medication Sig  . amLODipine (NORVASC) 5 MG tablet TAKE 1 TABLET (5 MG TOTAL) BY MOUTH DAILY.  Marland Kitchen apixaban (ELIQUIS) 5 MG TABS tablet TAKE 1 TABLET (5 MG TOTAL) BY MOUTH 2 (TWO) TIMES DAILY.  Marland Kitchen doxazosin (CARDURA) 4 MG tablet Take 1 tablet (4 mg total) by mouth daily.  Marland Kitchen EPIPEN 2-PAK 0.3 MG/0.3ML SOAJ injection  Inject 2 mLs into the muscle as needed.  . hydrochlorothiazide (HYDRODIURIL) 25 MG tablet Take 1 tablet (25 mg total) by mouth daily.  Marland Kitchen latanoprost (XALATAN) 0.005 % ophthalmic solution Place 1 drop into both eyes at bedtime.  . Multiple Vitamins-Minerals (PRESERVISION AREDS 2 PO) Take by mouth daily.  Marland Kitchen omeprazole (PRILOSEC) 40 MG capsule Take 1 capsule (40 mg total) by mouth daily.  . pindolol (VISKEN) 10 MG tablet Take 1 tablet (10 mg total) by mouth daily.  . simvastatin (ZOCOR) 20 MG tablet TAKE 1 TABLET (20 MG TOTAL) BY MOUTH DAILY.   No facility-administered encounter medications on file as of 02/10/2015.    Activities of Daily Living In your present state of health, do you have any difficulty performing the following activities: 02/10/2015  Hearing? Y  Vision? N  Difficulty concentrating or making decisions? Y  Walking or climbing stairs? N  Dressing or bathing? N  Doing errands, shopping? N  Preparing Food and eating ? N  Using the Toilet? N  In the past six months, have you accidently leaked urine? N  Do you have problems with loss of bowel control? N  Managing your Medications?  N  Managing your Finances? N  Housekeeping or managing your Housekeeping? N    Patient Care Team: Crecencio Mc, MD as PCP - General (Internal Medicine)   Assessment:   This is a routine wellness examination for Todd Golden. The goal of the wellness visit is to assist the patient how to close the gaps in care and create a preventative care plan for the patient.   Osteoporosis risk reviewed.   Medications reviewed; taking without issues or barriers.   Safety issues reviewed; smoke detectors in the home. No firearms in the home. Wears seatbelts when driving or riding with others. No violence in the home.   No identified risk were noted; The patient was oriented x 3; appropriate in dress and manner and no objective failures at ADL's or IADL's.   ZOSTAVAX vaccine postponed for follow up with  insurance.  Atrial fibrillation-stable and followed by Dr. Fletcher Anon Unspecified atrial fib-stable and followed by Dr. Dorise Hiss and followed by PCP Cerebral Infarction-stable and followed by Dr. Fletcher Anon  Patient Concerns:  None at this time.  Follow up with PCP as needed.  Exercise Activities and Dietary recommendations Current Exercise Habits:: Home exercise routine, Type of exercise: treadmill;Other - see comments [facility] in the summer, works in the [employer] with his son), Time (Minutes): 20, Frequency (Times/Week): 2, Weekly Exercise (Minutes/Week): 40, Intensity: Mild  Goals    . Healthy Lifestyle     Drink more water, stay hydrated. Exercise  more in the facility on campus and walk more with wife. Choose lean meats, fruits and vegetables.       Fall Risk Fall Risk  02/10/2015 02/08/2015 01/24/2014 01/20/2013  Falls in the past year? No No No No   Depression Screen PHQ 2/9 Scores 02/10/2015 02/08/2015 01/24/2014 01/20/2013  PHQ - 2 Score 0 0 0 0    Cognitive Testing MMSE - Mini Mental State Exam 02/10/2015  Orientation to time 5  Orientation to Place 5  Registration 3  Attention/ Calculation 5  Recall 3  Language- name 2 objects 2  Language- repeat 1  Language- follow 3 step command 3  Language- read & follow direction 1  Write a sentence 1  Copy design 1  Total score 30    Immunization History  Administered Date(s) Administered  . Influenza,inj,Quad PF,36+ Mos 10/21/2012  . Influenza-Unspecified 11/09/2013, 11/18/2014  . Pneumococcal Conjugate-13 12/10/2013  . Pneumococcal Polysaccharide-23 10/21/2009, 02/08/2015  . Tdap 06/12/2013   Screening Tests Health Maintenance  Topic Date Due  . ZOSTAVAX  07/07/1992  . INFLUENZA VACCINE  08/22/2015  . TETANUS/TDAP  06/13/2023  . PNA vac Low Risk Adult  Completed      Plan:   End of life planning; Advance aging; Advanced directives discussed. Copy requested of current HCPOA/Living Will.   During the course  of the visit the patient was educated and counseled about the following appropriate screening and preventive services:   Vaccines to include Pneumoccal, Influenza, Hepatitis B, Td, Zostavax, HCV  Electrocardiogram  Cardiovascular Disease  Colorectal cancer screening  Diabetes screening  Prostate Cancer Screening  Glaucoma screening  Nutrition counseling   Smoking cessation counseling  Patient Instructions (the written plan) was given to the patient.    Varney Biles, LPN  [id number]

## 2015-02-10 NOTE — Patient Instructions (Addendum)
Todd Golden,  Thank you for taking time to come for your Medicare Wellness Visit.  I appreciate your ongoing commitment to your health goals. Please review the following plan we discussed and let me know if I can assist you in the future.  Follow up with Dr. Derrel Nip as needed.   Health Maintenance, Male A healthy lifestyle and preventative care can promote health and wellness.  Maintain regular health, dental, and eye exams.  Eat a healthy diet. Foods like vegetables, fruits, whole grains, low-fat dairy products, and lean protein foods contain the nutrients you need and are low in calories. Decrease your intake of foods high in solid fats, added sugars, and salt. Get information about a proper diet from your health care provider, if necessary.  Regular physical exercise is one of the most important things you can do for your health. Most adults should get at least 150 minutes of moderate-intensity exercise (any activity that increases your heart rate and causes you to sweat) each week. In addition, most adults need muscle-strengthening exercises on 2 or more days a week.   Maintain a healthy weight. The body mass index (BMI) is a screening tool to identify possible weight problems. It provides an estimate of body fat based on height and weight. Your health care provider can find your BMI and can help you achieve or maintain a healthy weight. For males 20 years and older:  A BMI below 18.5 is considered underweight.  A BMI of 18.5 to 24.9 is normal.  A BMI of 25 to 29.9 is considered overweight.  A BMI of 30 and above is considered obese.  Maintain normal blood lipids and cholesterol by exercising and minimizing your intake of saturated fat. Eat a balanced diet with plenty of fruits and vegetables. Blood tests for lipids and cholesterol should begin at age 77 and be repeated every 5 years. If your lipid or cholesterol levels are high, you are over age 8, or you are at high risk for heart  disease, you may need your cholesterol levels checked more frequently.Ongoing high lipid and cholesterol levels should be treated with medicines if diet and exercise are not working.  If you smoke, find out from your health care provider how to quit. If you do not use tobacco, do not start.  Lung cancer screening is recommended for adults aged 35-80 years who are at high risk for developing lung cancer because of a history of smoking. A yearly low-dose CT scan of the lungs is recommended for people who have at least a 30-pack-year history of smoking and are current smokers or have quit within the past 15 years. A pack year of smoking is smoking an average of 1 pack of cigarettes a day for 1 year (for example, a 30-pack-year history of smoking could mean smoking 1 pack a day for 30 years or 2 packs a day for 15 years). Yearly screening should continue until the smoker has stopped smoking for at least 15 years. Yearly screening should be stopped for people who develop a health problem that would prevent them from having lung cancer treatment.  If you choose to drink alcohol, do not have more than 2 drinks per day. One drink is considered to be 12 oz (360 mL) of beer, 5 oz (150 mL) of wine, or 1.5 oz (45 mL) of liquor.  Avoid the use of street drugs. Do not share needles with anyone. Ask for help if you need support or instructions about stopping the use  of drugs.  High blood pressure causes heart disease and increases the risk of stroke. High blood pressure is more likely to develop in:  People who have blood pressure in the end of the normal range (100-139/85-89 mm Hg).  People who are overweight or obese.  People who are African American.  If you are 52-50 years of age, have your blood pressure checked every 3-5 years. If you are 56 years of age or older, have your blood pressure checked every year. You should have your blood pressure measured twice--once when you are at a hospital or clinic, and  once when you are not at a hospital or clinic. Record the average of the two measurements. To check your blood pressure when you are not at a hospital or clinic, you can use:  An automated blood pressure machine at a pharmacy.  A home blood pressure monitor.  If you are 77-39 years old, ask your health care provider if you should take aspirin to prevent heart disease.  Diabetes screening involves taking a blood sample to check your fasting blood sugar level. This should be done once every 3 years after age 35 if you are at a normal weight and without risk factors for diabetes. Testing should be considered at a younger age or be carried out more frequently if you are overweight and have at least 1 risk factor for diabetes.  Colorectal cancer can be detected and often prevented. Most routine colorectal cancer screening begins at the age of 60 and continues through age 44. However, your health care provider may recommend screening at an earlier age if you have risk factors for colon cancer. On a yearly basis, your health care provider may provide home test kits to check for hidden blood in the stool. A small camera at the end of a tube may be used to directly examine the colon (sigmoidoscopy or colonoscopy) to detect the earliest forms of colorectal cancer. Talk to your health care provider about this at age 4 when routine screening begins. A direct exam of the colon should be repeated every 5-10 years through age 75, unless early forms of precancerous polyps or small growths are found.  People who are at an increased risk for hepatitis B should be screened for this virus. You are considered at high risk for hepatitis B if:  You were born in a country where hepatitis B occurs often. Talk with your health care provider about which countries are considered high risk.  Your parents were born in a high-risk country and you have not received a shot to protect against hepatitis B (hepatitis B  vaccine).  You have HIV or AIDS.  You use needles to inject street drugs.  You live with, or have sex with, someone who has hepatitis B.  You are a man who has sex with other men (MSM).  You get hemodialysis treatment.  You take certain medicines for conditions like cancer, organ transplantation, and autoimmune conditions.  Hepatitis C blood testing is recommended for all people born from [birth year] through 1963 and any individual with known risk factors for hepatitis C.  Healthy men should no longer receive prostate-specific antigen (PSA) blood tests as part of routine cancer screening. Talk to your health care provider about prostate cancer screening.  Testicular cancer screening is not recommended for adolescents or adult males who have no symptoms. Screening includes self-exam, a health care provider exam, and other screening tests. Consult with your health care provider about any symptoms you  have or any concerns you have about testicular cancer.  Practice safe sex. Use condoms and avoid high-risk sexual practices to reduce the spread of sexually transmitted infections (STIs).  You should be screened for STIs, including gonorrhea and chlamydia if:  You are sexually active and are younger than 24 years.  You are older than 24 years, and your health care provider tells you that you are at risk for this type of infection.  Your sexual activity has changed since you were last screened, and you are at an increased risk for chlamydia or gonorrhea. Ask your health care provider if you are at risk.  If you are at risk of being infected with HIV, it is recommended that you take a prescription medicine daily to prevent HIV infection. This is called pre-exposure prophylaxis (PrEP). You are considered at risk if:  You are a man who has sex with other men (MSM).  You are a heterosexual man who is sexually active with multiple partners.  You take drugs by injection.  You are sexually active  with a partner who has HIV.  Talk with your health care provider about whether you are at high risk of being infected with HIV. If you choose to begin PrEP, you should first be tested for HIV. You should then be tested every 3 months for as long as you are taking PrEP.  Use sunscreen. Apply sunscreen liberally and repeatedly throughout the day. You should seek shade when your shadow is shorter than you. Protect yourself by wearing long sleeves, pants, a wide-brimmed hat, and sunglasses year round whenever you are outdoors.  Tell your health care provider of new moles or changes in moles, especially if there is a change in shape or color. Also, tell your health care provider if a mole is larger than the size of a pencil eraser.  A one-time screening for abdominal aortic aneurysm (AAA) and surgical repair of large AAAs by ultrasound is recommended for men aged 60-75 years who are current or former smokers.  Stay current with your vaccines (immunizations).   This information is not intended to replace advice given to you by your health care provider. Make sure you discuss any questions you have with your health care provider.   Document Released: 07/06/2007 Document Revised: 01/28/2014 Document Reviewed: 06/04/2010 Elsevier Interactive Patient Education [employer].

## 2015-02-11 DIAGNOSIS — M25551 Pain in right hip: Secondary | ICD-10-CM | POA: Insufficient documentation

## 2015-02-11 DIAGNOSIS — M25552 Pain in left hip: Secondary | ICD-10-CM | POA: Insufficient documentation

## 2015-02-11 DIAGNOSIS — R351 Nocturia: Secondary | ICD-10-CM

## 2015-02-11 DIAGNOSIS — N401 Enlarged prostate with lower urinary tract symptoms: Secondary | ICD-10-CM | POA: Insufficient documentation

## 2015-02-11 NOTE — Assessment & Plan Note (Signed)
Well controlled on current regimen. Renal function stable, no changes toda  Lab Results  Component Value Date   CREATININE 1.11 02/08/2015   Lab Results  Component Value Date   NA 140 02/08/2015   K 4.0 02/08/2015   CL 102 02/08/2015   CO2 31 02/08/2015

## 2015-02-11 NOTE — Assessment & Plan Note (Signed)
Offered additional therapy , which he has deferred.

## 2015-02-11 NOTE — Assessment & Plan Note (Signed)
With chronic right hydronephrosis by prior imaging,  Infrequent  episodes of flank pain described,  Aggravated by large meals and decreased hydration,  Relieved by drinking cranberry juice and relaxing  

## 2015-02-11 NOTE — Assessment & Plan Note (Signed)
Plain films ordered to evaluate hip joint. Advised to pre treat with tylenol

## 2015-02-13 ENCOUNTER — Encounter: Payer: Self-pay | Admitting: *Deleted

## 2015-02-14 DIAGNOSIS — J301 Allergic rhinitis due to pollen: Secondary | ICD-10-CM | POA: Diagnosis not present

## 2015-02-14 DIAGNOSIS — J3089 Other allergic rhinitis: Secondary | ICD-10-CM | POA: Diagnosis not present

## 2015-02-21 ENCOUNTER — Other Ambulatory Visit: Payer: Self-pay | Admitting: Internal Medicine

## 2015-02-21 DIAGNOSIS — J3089 Other allergic rhinitis: Secondary | ICD-10-CM | POA: Diagnosis not present

## 2015-02-21 DIAGNOSIS — J301 Allergic rhinitis due to pollen: Secondary | ICD-10-CM | POA: Diagnosis not present

## 2015-02-23 DIAGNOSIS — J3089 Other allergic rhinitis: Secondary | ICD-10-CM | POA: Diagnosis not present

## 2015-02-23 DIAGNOSIS — J301 Allergic rhinitis due to pollen: Secondary | ICD-10-CM | POA: Diagnosis not present

## 2015-02-28 DIAGNOSIS — J3089 Other allergic rhinitis: Secondary | ICD-10-CM | POA: Diagnosis not present

## 2015-02-28 DIAGNOSIS — J301 Allergic rhinitis due to pollen: Secondary | ICD-10-CM | POA: Diagnosis not present

## 2015-03-07 DIAGNOSIS — J3089 Other allergic rhinitis: Secondary | ICD-10-CM | POA: Diagnosis not present

## 2015-03-07 DIAGNOSIS — J301 Allergic rhinitis due to pollen: Secondary | ICD-10-CM | POA: Diagnosis not present

## 2015-03-14 DIAGNOSIS — J3089 Other allergic rhinitis: Secondary | ICD-10-CM | POA: Diagnosis not present

## 2015-03-14 DIAGNOSIS — J301 Allergic rhinitis due to pollen: Secondary | ICD-10-CM | POA: Diagnosis not present

## 2015-03-21 DIAGNOSIS — J3089 Other allergic rhinitis: Secondary | ICD-10-CM | POA: Diagnosis not present

## 2015-03-21 DIAGNOSIS — J301 Allergic rhinitis due to pollen: Secondary | ICD-10-CM | POA: Diagnosis not present

## 2015-03-23 DIAGNOSIS — J301 Allergic rhinitis due to pollen: Secondary | ICD-10-CM | POA: Diagnosis not present

## 2015-03-23 DIAGNOSIS — J3089 Other allergic rhinitis: Secondary | ICD-10-CM | POA: Diagnosis not present

## 2015-03-24 DIAGNOSIS — H353211 Exudative age-related macular degeneration, right eye, with active choroidal neovascularization: Secondary | ICD-10-CM | POA: Diagnosis not present

## 2015-03-30 DIAGNOSIS — J301 Allergic rhinitis due to pollen: Secondary | ICD-10-CM | POA: Diagnosis not present

## 2015-03-30 DIAGNOSIS — J3089 Other allergic rhinitis: Secondary | ICD-10-CM | POA: Diagnosis not present

## 2015-04-04 DIAGNOSIS — J3089 Other allergic rhinitis: Secondary | ICD-10-CM | POA: Diagnosis not present

## 2015-04-04 DIAGNOSIS — J301 Allergic rhinitis due to pollen: Secondary | ICD-10-CM | POA: Diagnosis not present

## 2015-04-11 DIAGNOSIS — J3089 Other allergic rhinitis: Secondary | ICD-10-CM | POA: Diagnosis not present

## 2015-04-11 DIAGNOSIS — J301 Allergic rhinitis due to pollen: Secondary | ICD-10-CM | POA: Diagnosis not present

## 2015-04-20 DIAGNOSIS — J3089 Other allergic rhinitis: Secondary | ICD-10-CM | POA: Diagnosis not present

## 2015-04-20 DIAGNOSIS — J301 Allergic rhinitis due to pollen: Secondary | ICD-10-CM | POA: Diagnosis not present

## 2015-04-25 DIAGNOSIS — J3089 Other allergic rhinitis: Secondary | ICD-10-CM | POA: Diagnosis not present

## 2015-04-25 DIAGNOSIS — J301 Allergic rhinitis due to pollen: Secondary | ICD-10-CM | POA: Diagnosis not present

## 2015-05-01 ENCOUNTER — Other Ambulatory Visit: Payer: Self-pay

## 2015-05-01 MED ORDER — PINDOLOL 10 MG PO TABS: 10.0000 mg | ORAL_TABLET | Freq: Every day | ORAL | Status: DC

## 2015-05-02 DIAGNOSIS — J301 Allergic rhinitis due to pollen: Secondary | ICD-10-CM | POA: Diagnosis not present

## 2015-05-02 DIAGNOSIS — J3089 Other allergic rhinitis: Secondary | ICD-10-CM | POA: Diagnosis not present

## 2015-05-09 DIAGNOSIS — J3089 Other allergic rhinitis: Secondary | ICD-10-CM | POA: Diagnosis not present

## 2015-05-09 DIAGNOSIS — J301 Allergic rhinitis due to pollen: Secondary | ICD-10-CM | POA: Diagnosis not present

## 2015-05-12 DIAGNOSIS — H353211 Exudative age-related macular degeneration, right eye, with active choroidal neovascularization: Secondary | ICD-10-CM | POA: Diagnosis not present

## 2015-05-15 ENCOUNTER — Other Ambulatory Visit: Payer: Self-pay

## 2015-05-15 MED ORDER — AMLODIPINE BESYLATE 5 MG PO TABS: ORAL_TABLET | ORAL | Status: DC

## 2015-05-16 DIAGNOSIS — J3089 Other allergic rhinitis: Secondary | ICD-10-CM | POA: Diagnosis not present

## 2015-05-16 DIAGNOSIS — J301 Allergic rhinitis due to pollen: Secondary | ICD-10-CM | POA: Diagnosis not present

## 2015-05-23 DIAGNOSIS — J301 Allergic rhinitis due to pollen: Secondary | ICD-10-CM | POA: Diagnosis not present

## 2015-05-23 DIAGNOSIS — J3089 Other allergic rhinitis: Secondary | ICD-10-CM | POA: Diagnosis not present

## 2015-05-30 DIAGNOSIS — J3089 Other allergic rhinitis: Secondary | ICD-10-CM | POA: Diagnosis not present

## 2015-05-30 DIAGNOSIS — J301 Allergic rhinitis due to pollen: Secondary | ICD-10-CM | POA: Diagnosis not present

## 2015-06-06 DIAGNOSIS — J301 Allergic rhinitis due to pollen: Secondary | ICD-10-CM | POA: Diagnosis not present

## 2015-06-06 DIAGNOSIS — J3089 Other allergic rhinitis: Secondary | ICD-10-CM | POA: Diagnosis not present

## 2015-06-13 DIAGNOSIS — J301 Allergic rhinitis due to pollen: Secondary | ICD-10-CM | POA: Diagnosis not present

## 2015-06-13 DIAGNOSIS — J3089 Other allergic rhinitis: Secondary | ICD-10-CM | POA: Diagnosis not present

## 2015-06-16 DIAGNOSIS — H353211 Exudative age-related macular degeneration, right eye, with active choroidal neovascularization: Secondary | ICD-10-CM | POA: Diagnosis not present

## 2015-06-20 DIAGNOSIS — J301 Allergic rhinitis due to pollen: Secondary | ICD-10-CM | POA: Diagnosis not present

## 2015-06-20 DIAGNOSIS — J3089 Other allergic rhinitis: Secondary | ICD-10-CM | POA: Diagnosis not present

## 2015-06-27 DIAGNOSIS — J301 Allergic rhinitis due to pollen: Secondary | ICD-10-CM | POA: Diagnosis not present

## 2015-06-27 DIAGNOSIS — J3089 Other allergic rhinitis: Secondary | ICD-10-CM | POA: Diagnosis not present

## 2015-07-03 ENCOUNTER — Encounter: Payer: Self-pay | Admitting: Cardiovascular Disease

## 2015-07-03 ENCOUNTER — Ambulatory Visit (INDEPENDENT_AMBULATORY_CARE_PROVIDER_SITE_OTHER): Payer: Medicare Other | Admitting: Cardiovascular Disease

## 2015-07-03 VITALS — BP 124/66 | HR 61 | Ht 70.0 in | Wt 199.5 lb

## 2015-07-03 DIAGNOSIS — I4891 Unspecified atrial fibrillation: Secondary | ICD-10-CM | POA: Diagnosis not present

## 2015-07-03 MED ORDER — APIXABAN 5 MG PO TABS: ORAL_TABLET | ORAL | Status: DC

## 2015-07-03 NOTE — Progress Notes (Signed)
Cardiology Office Note   Date:  07/03/2015   ID:  Todd Golden, DOB October 23, 1930, MRN [id number]  PCP:  Crecencio Mc, MD  Cardiologist:   Kathlyn Sacramento, MD   Chief Complaint  Patient presents with  . other    6 month follow up. Meds reviewed by the patient's med list. "doing well."       History of Present Illness: Todd Golden is a 82 y.o. male who presents for a follow-up visit regarding paroxysmal atrial fibrillation. He was admitted at [facility] in November 2015 with expressive aphasia and mild ataxia due to leftMCA stroke.Carotid Doppler shows no obstructive disease. Echocardiogram showed normal LV systolic function with mildly dilated left atrium.  He was noted during his initial evaluation to be in atrial fibrillation and was started on Eliquis.  He has been doing very well and denies any chest pain, shortness of breath or palpitations.  He continues to complain of relatively low blood pressure but usually systolic blood pressure does not go below 110.  Past Medical History  Diagnosis Date  . Asthma   . Arthritis   . Glaucoma   . Allergy   . Hypertension   . Colon polyps   . Chronic kidney disease     stent  . Stroke ([place])   . Hyperlipidemia   . Chronic constipation   . Cough   . Cataract   . Ureteral stricture   . GERD (gastroesophageal reflux disease)   . HOH (hard of hearing)     AIDS  . Macular degeneration     Past Surgical History  Procedure Laterality Date  . Joint replacement Left 2014    knee partial replacement  . Kidney stent Right 1995  . Cataract extraction w/phaco Left 12/20/2014    Procedure: CATARACT EXTRACTION PHACO AND INTRAOCULAR LENS PLACEMENT (IOC);  Surgeon: Birder Robson, MD;  Location: [facility] ORS;  Service: Ophthalmology;  Laterality: Left;   [place]     00:59.6 AP%    21.2 CDE    12.66 fluid pack lot [id number]     Current Outpatient Prescriptions  Medication Sig Dispense Refill  . amLODipine (NORVASC) 5 MG tablet TAKE  1 TABLET (5 MG TOTAL) BY MOUTH DAILY. 90 tablet 1  . apixaban (ELIQUIS) 5 MG TABS tablet TAKE 1 TABLET (5 MG TOTAL) BY MOUTH 2 (TWO) TIMES DAILY. 60 tablet 6  . doxazosin (CARDURA) 4 MG tablet Take 1 tablet (4 mg total) by mouth daily. 30 tablet 6  . EPIPEN 2-PAK 0.3 MG/0.3ML SOAJ injection Inject 2 mLs into the muscle as needed.    . hydrochlorothiazide (HYDRODIURIL) 25 MG tablet Take 1 tablet (25 mg total) by mouth daily. 90 tablet 1  . latanoprost (XALATAN) 0.005 % ophthalmic solution Place 1 drop into both eyes at bedtime.    . Multiple Vitamins-Minerals (PRESERVISION AREDS 2 PO) Take by mouth daily.    Marland Kitchen omeprazole (PRILOSEC) 40 MG capsule Take 1 capsule (40 mg total) by mouth daily. 90 capsule 3  . pindolol (VISKEN) 10 MG tablet Take 1 tablet (10 mg total) by mouth daily. 90 tablet 1  . simvastatin (ZOCOR) 20 MG tablet TAKE 1 TABLET (20 MG TOTAL) BY MOUTH DAILY. 90 tablet 1   No current facility-administered medications for this visit.    Allergies:   Review of patient's allergies indicates no known allergies.    Social History:  The patient  reports that he has never smoked. He has never used smokeless tobacco. He  reports that he drinks alcohol. He reports that he does not use illicit drugs.   Family History:  The patient's family history includes Arthritis in his mother; Cancer (age of onset: 65) in his mother; Stroke in his father.    ROS:  Please see the history of present illness.   Otherwise, review of systems are positive for none.   All other systems are reviewed and negative.    PHYSICAL EXAM: VS:  BP 124/66 mmHg  Ht 5\' 10"  (1.778 m)  Wt 199 lb 8 oz (90.493 kg)  BMI 28.63 kg/m2 , BMI Body mass index is 28.63 kg/(m^2). GEN: Well nourished, well developed, in no acute distress HEENT: normal Neck: no JVD, carotid bruits, or masses Cardiac: RRR; no murmurs, rubs, or gallops,no edema  Respiratory:  clear to auscultation bilaterally, normal work of breathing GI: soft,  nontender, nondistended, + BS MS: no deformity or atrophy Skin: warm and dry, no rash Neuro:  Strength and sensation are intact Psych: euthymic mood, full affect   EKG:  EKG is not ordered today.    Recent Labs: 07/27/2014: Hemoglobin 13.5; Platelets 231.0; TSH 1.55 02/08/2015: ALT 25; BUN 17; Creatinine, Ser 1.11; Potassium 4.0; Sodium 140    Lipid Panel    Component Value Date/Time   CHOL 151 07/27/2014 0835   CHOL 168 12/04/2013 0549   TRIG 86.0 07/27/2014 0835   TRIG 96 12/04/2013 0549   HDL 49.80 07/27/2014 0835   HDL 44 12/04/2013 0549   CHOLHDL 3 07/27/2014 0835   VLDL 17.2 07/27/2014 0835   VLDL 19 12/04/2013 0549   LDLCALC 84 07/27/2014 0835   LDLCALC 105* 12/04/2013 0549      Wt Readings from Last 3 Encounters:  07/03/15 199 lb 8 oz (90.493 kg)  02/10/15 203 lb 9.6 oz (92.352 kg)  02/08/15 203 lb 6 oz (92.25 kg)        ASSESSMENT AND PLAN:  1.  Paroxysmal atrial fibrillation: No evidence of recurrent arrhythmia. He is tolerating anticoagulation without bleeding side effects.  2. Essential hypertension: Blood pressure is controlled on current medications.    Disposition:   FU with me in 1 year  Signed,  Kathlyn Sacramento, MD  07/03/2015 10:11 AM    [place]

## 2015-07-03 NOTE — Patient Instructions (Signed)
Medication Instructions: Continue same medications.   Labwork: None.   Procedures/Testing: None.   Follow-Up: 1 year with Dr. Jonta Gastineau.   Any Additional Special Instructions Will Be Listed Below (If Applicable).     If you need a refill on your cardiac medications before your next appointment, please call your pharmacy.   

## 2015-07-04 DIAGNOSIS — J301 Allergic rhinitis due to pollen: Secondary | ICD-10-CM | POA: Diagnosis not present

## 2015-07-04 DIAGNOSIS — J3089 Other allergic rhinitis: Secondary | ICD-10-CM | POA: Diagnosis not present

## 2015-07-11 DIAGNOSIS — J3089 Other allergic rhinitis: Secondary | ICD-10-CM | POA: Diagnosis not present

## 2015-07-11 DIAGNOSIS — J301 Allergic rhinitis due to pollen: Secondary | ICD-10-CM | POA: Diagnosis not present

## 2015-07-20 DIAGNOSIS — J301 Allergic rhinitis due to pollen: Secondary | ICD-10-CM | POA: Diagnosis not present

## 2015-07-20 DIAGNOSIS — J3089 Other allergic rhinitis: Secondary | ICD-10-CM | POA: Diagnosis not present

## 2015-07-21 DIAGNOSIS — J3089 Other allergic rhinitis: Secondary | ICD-10-CM | POA: Diagnosis not present

## 2015-07-21 DIAGNOSIS — J301 Allergic rhinitis due to pollen: Secondary | ICD-10-CM | POA: Diagnosis not present

## 2015-07-27 ENCOUNTER — Other Ambulatory Visit: Payer: Self-pay | Admitting: Surgical

## 2015-07-27 DIAGNOSIS — J301 Allergic rhinitis due to pollen: Secondary | ICD-10-CM | POA: Diagnosis not present

## 2015-07-27 DIAGNOSIS — J3089 Other allergic rhinitis: Secondary | ICD-10-CM | POA: Diagnosis not present

## 2015-07-27 MED ORDER — HYDROCHLOROTHIAZIDE 25 MG PO TABS: 25.0000 mg | ORAL_TABLET | Freq: Every day | ORAL | Status: DC

## 2015-07-28 DIAGNOSIS — H353211 Exudative age-related macular degeneration, right eye, with active choroidal neovascularization: Secondary | ICD-10-CM | POA: Diagnosis not present

## 2015-07-31 ENCOUNTER — Other Ambulatory Visit: Payer: Self-pay

## 2015-07-31 MED ORDER — PINDOLOL 10 MG PO TABS: 10.0000 mg | ORAL_TABLET | Freq: Every day | ORAL | Status: DC

## 2015-08-03 DIAGNOSIS — J3089 Other allergic rhinitis: Secondary | ICD-10-CM | POA: Diagnosis not present

## 2015-08-03 DIAGNOSIS — J301 Allergic rhinitis due to pollen: Secondary | ICD-10-CM | POA: Diagnosis not present

## 2015-08-04 ENCOUNTER — Other Ambulatory Visit: Payer: Self-pay

## 2015-08-04 MED ORDER — OMEPRAZOLE 40 MG PO CPDR: 40.0000 mg | DELAYED_RELEASE_CAPSULE | Freq: Every day | ORAL | Status: DC

## 2015-08-04 NOTE — Telephone Encounter (Signed)
Medication refill

## 2015-08-08 DIAGNOSIS — J301 Allergic rhinitis due to pollen: Secondary | ICD-10-CM | POA: Diagnosis not present

## 2015-08-08 DIAGNOSIS — J3089 Other allergic rhinitis: Secondary | ICD-10-CM | POA: Diagnosis not present

## 2015-08-15 DIAGNOSIS — J3089 Other allergic rhinitis: Secondary | ICD-10-CM | POA: Diagnosis not present

## 2015-08-15 DIAGNOSIS — J301 Allergic rhinitis due to pollen: Secondary | ICD-10-CM | POA: Diagnosis not present

## 2015-08-18 ENCOUNTER — Other Ambulatory Visit: Payer: Self-pay | Admitting: Cardiovascular Disease

## 2015-08-22 DIAGNOSIS — J301 Allergic rhinitis due to pollen: Secondary | ICD-10-CM | POA: Diagnosis not present

## 2015-08-22 DIAGNOSIS — J3089 Other allergic rhinitis: Secondary | ICD-10-CM | POA: Diagnosis not present

## 2015-08-24 DIAGNOSIS — J301 Allergic rhinitis due to pollen: Secondary | ICD-10-CM | POA: Diagnosis not present

## 2015-08-24 DIAGNOSIS — J3089 Other allergic rhinitis: Secondary | ICD-10-CM | POA: Diagnosis not present

## 2015-08-29 DIAGNOSIS — J301 Allergic rhinitis due to pollen: Secondary | ICD-10-CM | POA: Diagnosis not present

## 2015-08-29 DIAGNOSIS — J3089 Other allergic rhinitis: Secondary | ICD-10-CM | POA: Diagnosis not present

## 2015-08-31 DIAGNOSIS — J301 Allergic rhinitis due to pollen: Secondary | ICD-10-CM | POA: Diagnosis not present

## 2015-08-31 DIAGNOSIS — J3089 Other allergic rhinitis: Secondary | ICD-10-CM | POA: Diagnosis not present

## 2015-09-05 DIAGNOSIS — J301 Allergic rhinitis due to pollen: Secondary | ICD-10-CM | POA: Diagnosis not present

## 2015-09-05 DIAGNOSIS — J3089 Other allergic rhinitis: Secondary | ICD-10-CM | POA: Diagnosis not present

## 2015-09-08 DIAGNOSIS — H353211 Exudative age-related macular degeneration, right eye, with active choroidal neovascularization: Secondary | ICD-10-CM | POA: Diagnosis not present

## 2015-09-12 DIAGNOSIS — J301 Allergic rhinitis due to pollen: Secondary | ICD-10-CM | POA: Diagnosis not present

## 2015-09-12 DIAGNOSIS — J3089 Other allergic rhinitis: Secondary | ICD-10-CM | POA: Diagnosis not present

## 2015-09-19 DIAGNOSIS — J301 Allergic rhinitis due to pollen: Secondary | ICD-10-CM | POA: Diagnosis not present

## 2015-09-19 DIAGNOSIS — J3089 Other allergic rhinitis: Secondary | ICD-10-CM | POA: Diagnosis not present

## 2015-09-26 DIAGNOSIS — J3089 Other allergic rhinitis: Secondary | ICD-10-CM | POA: Diagnosis not present

## 2015-09-26 DIAGNOSIS — J301 Allergic rhinitis due to pollen: Secondary | ICD-10-CM | POA: Diagnosis not present

## 2015-10-03 DIAGNOSIS — J301 Allergic rhinitis due to pollen: Secondary | ICD-10-CM | POA: Diagnosis not present

## 2015-10-03 DIAGNOSIS — J3089 Other allergic rhinitis: Secondary | ICD-10-CM | POA: Diagnosis not present

## 2015-10-10 DIAGNOSIS — J3089 Other allergic rhinitis: Secondary | ICD-10-CM | POA: Diagnosis not present

## 2015-10-10 DIAGNOSIS — J301 Allergic rhinitis due to pollen: Secondary | ICD-10-CM | POA: Diagnosis not present

## 2015-10-17 DIAGNOSIS — J3089 Other allergic rhinitis: Secondary | ICD-10-CM | POA: Diagnosis not present

## 2015-10-17 DIAGNOSIS — J301 Allergic rhinitis due to pollen: Secondary | ICD-10-CM | POA: Diagnosis not present

## 2015-10-23 DIAGNOSIS — H353211 Exudative age-related macular degeneration, right eye, with active choroidal neovascularization: Secondary | ICD-10-CM | POA: Diagnosis not present

## 2015-10-24 DIAGNOSIS — J301 Allergic rhinitis due to pollen: Secondary | ICD-10-CM | POA: Diagnosis not present

## 2015-10-24 DIAGNOSIS — J3089 Other allergic rhinitis: Secondary | ICD-10-CM | POA: Diagnosis not present

## 2015-10-27 ENCOUNTER — Other Ambulatory Visit: Payer: Self-pay | Admitting: Internal Medicine

## 2015-10-29 ENCOUNTER — Other Ambulatory Visit: Payer: Self-pay | Admitting: Internal Medicine

## 2015-10-31 DIAGNOSIS — J3089 Other allergic rhinitis: Secondary | ICD-10-CM | POA: Diagnosis not present

## 2015-10-31 DIAGNOSIS — J301 Allergic rhinitis due to pollen: Secondary | ICD-10-CM | POA: Diagnosis not present

## 2015-11-07 DIAGNOSIS — J301 Allergic rhinitis due to pollen: Secondary | ICD-10-CM | POA: Diagnosis not present

## 2015-11-07 DIAGNOSIS — J3089 Other allergic rhinitis: Secondary | ICD-10-CM | POA: Diagnosis not present

## 2015-11-14 DIAGNOSIS — J3089 Other allergic rhinitis: Secondary | ICD-10-CM | POA: Diagnosis not present

## 2015-11-14 DIAGNOSIS — J301 Allergic rhinitis due to pollen: Secondary | ICD-10-CM | POA: Diagnosis not present

## 2015-11-16 DIAGNOSIS — Z23 Encounter for immunization: Secondary | ICD-10-CM | POA: Diagnosis not present

## 2015-11-21 DIAGNOSIS — J3089 Other allergic rhinitis: Secondary | ICD-10-CM | POA: Diagnosis not present

## 2015-11-21 DIAGNOSIS — J301 Allergic rhinitis due to pollen: Secondary | ICD-10-CM | POA: Diagnosis not present

## 2015-11-28 DIAGNOSIS — J3089 Other allergic rhinitis: Secondary | ICD-10-CM | POA: Diagnosis not present

## 2015-11-28 DIAGNOSIS — J301 Allergic rhinitis due to pollen: Secondary | ICD-10-CM | POA: Diagnosis not present

## 2015-12-04 DIAGNOSIS — H353211 Exudative age-related macular degeneration, right eye, with active choroidal neovascularization: Secondary | ICD-10-CM | POA: Diagnosis not present

## 2015-12-05 DIAGNOSIS — J301 Allergic rhinitis due to pollen: Secondary | ICD-10-CM | POA: Diagnosis not present

## 2015-12-05 DIAGNOSIS — J3089 Other allergic rhinitis: Secondary | ICD-10-CM | POA: Diagnosis not present

## 2015-12-12 DIAGNOSIS — J3089 Other allergic rhinitis: Secondary | ICD-10-CM | POA: Diagnosis not present

## 2015-12-12 DIAGNOSIS — J301 Allergic rhinitis due to pollen: Secondary | ICD-10-CM | POA: Diagnosis not present

## 2015-12-15 ENCOUNTER — Other Ambulatory Visit: Payer: Self-pay | Admitting: Internal Medicine

## 2015-12-19 DIAGNOSIS — J3089 Other allergic rhinitis: Secondary | ICD-10-CM | POA: Diagnosis not present

## 2015-12-19 DIAGNOSIS — J301 Allergic rhinitis due to pollen: Secondary | ICD-10-CM | POA: Diagnosis not present

## 2015-12-28 DIAGNOSIS — J3089 Other allergic rhinitis: Secondary | ICD-10-CM | POA: Diagnosis not present

## 2015-12-28 DIAGNOSIS — J301 Allergic rhinitis due to pollen: Secondary | ICD-10-CM | POA: Diagnosis not present

## 2016-01-02 DIAGNOSIS — J301 Allergic rhinitis due to pollen: Secondary | ICD-10-CM | POA: Diagnosis not present

## 2016-01-02 DIAGNOSIS — J3089 Other allergic rhinitis: Secondary | ICD-10-CM | POA: Diagnosis not present

## 2016-01-09 DIAGNOSIS — J3089 Other allergic rhinitis: Secondary | ICD-10-CM | POA: Diagnosis not present

## 2016-01-09 DIAGNOSIS — J301 Allergic rhinitis due to pollen: Secondary | ICD-10-CM | POA: Diagnosis not present

## 2016-01-16 DIAGNOSIS — J301 Allergic rhinitis due to pollen: Secondary | ICD-10-CM | POA: Diagnosis not present

## 2016-01-16 DIAGNOSIS — J3089 Other allergic rhinitis: Secondary | ICD-10-CM | POA: Diagnosis not present

## 2016-01-18 DIAGNOSIS — J301 Allergic rhinitis due to pollen: Secondary | ICD-10-CM | POA: Diagnosis not present

## 2016-01-18 DIAGNOSIS — J3089 Other allergic rhinitis: Secondary | ICD-10-CM | POA: Diagnosis not present

## 2016-01-23 ENCOUNTER — Other Ambulatory Visit: Payer: Self-pay | Admitting: Internal Medicine

## 2016-01-23 DIAGNOSIS — J3089 Other allergic rhinitis: Secondary | ICD-10-CM | POA: Diagnosis not present

## 2016-01-23 DIAGNOSIS — J301 Allergic rhinitis due to pollen: Secondary | ICD-10-CM | POA: Diagnosis not present

## 2016-01-25 DIAGNOSIS — J301 Allergic rhinitis due to pollen: Secondary | ICD-10-CM | POA: Diagnosis not present

## 2016-01-25 DIAGNOSIS — J3089 Other allergic rhinitis: Secondary | ICD-10-CM | POA: Diagnosis not present

## 2016-01-29 DIAGNOSIS — H353211 Exudative age-related macular degeneration, right eye, with active choroidal neovascularization: Secondary | ICD-10-CM | POA: Diagnosis not present

## 2016-01-30 DIAGNOSIS — J301 Allergic rhinitis due to pollen: Secondary | ICD-10-CM | POA: Diagnosis not present

## 2016-01-30 DIAGNOSIS — J3089 Other allergic rhinitis: Secondary | ICD-10-CM | POA: Diagnosis not present

## 2016-02-06 DIAGNOSIS — M216X9 Other acquired deformities of unspecified foot: Secondary | ICD-10-CM | POA: Diagnosis not present

## 2016-02-06 DIAGNOSIS — J3089 Other allergic rhinitis: Secondary | ICD-10-CM | POA: Diagnosis not present

## 2016-02-06 DIAGNOSIS — J301 Allergic rhinitis due to pollen: Secondary | ICD-10-CM | POA: Diagnosis not present

## 2016-02-12 ENCOUNTER — Ambulatory Visit: Payer: Medicare Other

## 2016-02-12 ENCOUNTER — Ambulatory Visit (INDEPENDENT_AMBULATORY_CARE_PROVIDER_SITE_OTHER): Payer: Medicare Other

## 2016-02-12 VITALS — BP 126/70 | HR 64 | Temp 97.8°F | Resp 14 | Ht 69.0 in | Wt 206.8 lb

## 2016-02-12 DIAGNOSIS — Z0000 Encounter for general adult medical examination without abnormal findings: Secondary | ICD-10-CM

## 2016-02-12 NOTE — Patient Instructions (Addendum)
  Todd Golden , Thank you for taking time to come for your Medicare Wellness Visit. I appreciate your ongoing commitment to your health goals. Please review the following plan we discussed and let me know if I can assist you in the future.   Follow up with Dr. Derrel Nip as needed.  These are the goals we discussed: Goals    . Healthy Lifestyle          Stay hydrated and drink plenty of fluids. Continue exercising at the facility and walking more with wife. Choose lean meats, fruits and vegetables.        This is a list of the screening recommended for you and due dates:  Health Maintenance  Topic Date Due  . Tetanus Vaccine  06/13/2023  . Flu Shot  Completed  . Shingles Vaccine  Completed  . Pneumonia vaccines  Completed

## 2016-02-12 NOTE — Progress Notes (Signed)
Subjective:   Todd Golden is a 83 y.o. male who presents for Medicare Annual/Subsequent preventive examination.  Review of Systems:  No ROS.  Medicare Wellness Visit.  Cardiac Risk Factors include: advanced age (>23men, >48 women);hypertension;male gender     Objective:    Vitals: BP 126/70 (BP Location: Right Arm, Patient Position: Sitting, Cuff Size: Normal)   Pulse 64   Temp 97.8 F (36.6 C) (Oral)   Resp 14   Ht 5\' 9"  (1.753 m)   Wt 206 lb 12.8 oz (93.8 kg)   SpO2 95%   BMI 30.54 kg/m   Body mass index is 30.54 kg/m.  Tobacco History  Smoking Status  . Never Smoker  Smokeless Tobacco  . Never Used     Counseling given: Not Answered   Past Medical History:  Diagnosis Date  . Allergy   . Arthritis   . Asthma   . Cataract   . Chronic constipation   . Chronic kidney disease    stent  . Colon polyps   . Cough   . GERD (gastroesophageal reflux disease)   . Glaucoma   . HOH (hard of hearing)    AIDS  . Hyperlipidemia   . Hypertension   . Macular degeneration   . Stroke ([place])   . Ureteral stricture    Past Surgical History:  Procedure Laterality Date  . CATARACT EXTRACTION W/PHACO Left 12/20/2014   Procedure: CATARACT EXTRACTION PHACO AND INTRAOCULAR LENS PLACEMENT (IOC);  Surgeon: Birder Robson, MD;  Location: [facility] ORS;  Service: Ophthalmology;  Laterality: Left;   [place]     00:59.6 AP%    21.2 CDE    12.66 fluid pack lot [id number]  . JOINT REPLACEMENT Left 2014   knee partial replacement  . kidney stent Right 1995   Family History  Problem Relation Age of Onset  . Arthritis Mother   . Cancer Mother 11    kidney  . Stroke Father    History  Sexual Activity  . Sexual activity: Not Currently    Outpatient Encounter Prescriptions as of 02/12/2016  Medication Sig  . amLODipine (NORVASC) 5 MG tablet take 1 tablet by mouth daily  . apixaban (ELIQUIS) 5 MG TABS tablet TAKE 1 TABLET (5 MG TOTAL) BY MOUTH 2 (TWO) TIMES DAILY.  Marland Kitchen doxazosin  (CARDURA) 4 MG tablet take 1 tablet by mouth once daily  . EPIPEN 2-PAK 0.3 MG/0.3ML SOAJ injection Inject 2 mLs into the muscle as needed.  . hydrochlorothiazide (HYDRODIURIL) 25 MG tablet take 1 tablet by mouth once daily  . latanoprost (XALATAN) 0.005 % ophthalmic solution Place 1 drop into both eyes at bedtime.  . Multiple Vitamins-Minerals (PRESERVISION AREDS 2 PO) Take by mouth daily.  Marland Kitchen omeprazole (PRILOSEC) 40 MG capsule Take 1 capsule (40 mg total) by mouth daily.  . pindolol (VISKEN) 10 MG tablet take 1 tablet by mouth once daily  . simvastatin (ZOCOR) 20 MG tablet take 1 tablet by mouth daily  . [DISCONTINUED] pindolol (VISKEN) 10 MG tablet Take 1 tablet (10 mg total) by mouth daily.   No facility-administered encounter medications on file as of 02/12/2016.     Activities of Daily Living In your present state of health, do you have any difficulty performing the following activities: 02/12/2016  Hearing? Y  Vision? N  Difficulty concentrating or making decisions? N  Walking or climbing stairs? N  Dressing or bathing? N  Doing errands, shopping? N  Preparing Food and eating ? N  Using  the Toilet? N  In the past six months, have you accidently leaked urine? N  Do you have problems with loss of bowel control? N  Managing your Medications? N  Managing your Finances? N  Housekeeping or managing your Housekeeping? N  Some recent data might be hidden    Patient Care Team: Crecencio Mc, MD as PCP - General (Internal Medicine)   Assessment:    This is a routine wellness examination for Todd Golden. The goal of the wellness visit is to assist the patient how to close the gaps in care and create a preventative care plan for the patient.   Osteoporosis risk reviewed.  Medications reviewed; taking without issues or barriers.  Safety issues reviewed; lives with wife.  Smoke detectors in the home. No firearms in the home. Wears seatbelts when driving or riding with others. No  violence in the home.  No identified risk were noted; The patient was oriented x 3; appropriate in dress and manner and no objective failures at ADL's or IADL's.   BMI; discussed the importance of a healthy diet, water intake and exercise. Educational material provided.  HTN; followed by PCP.  Health maintenance gaps; closed.  Patient Concerns: None at this time. Follow up with PCP as needed.  Exercise Activities and Dietary recommendations Current Exercise Habits: Home exercise routine, Type of exercise: walking;calisthenics;treadmill, Time (Minutes): 30, Frequency (Times/Week): 2, Weekly Exercise (Minutes/Week): 60, Intensity: Mild  Goals    . Healthy Lifestyle          Stay hydrated and drink plenty of fluids. Continue exercising at the facility and walking more with wife. Choose lean meats, fruits and vegetables.       Fall Risk Fall Risk  02/12/2016 02/10/2015 02/08/2015 01/24/2014 01/20/2013  Falls in the past year? No No No No No   Depression Screen PHQ 2/9 Scores 02/12/2016 02/10/2015 02/08/2015 01/24/2014  PHQ - 2 Score 0 0 0 0    Cognitive Function MMSE - Mini Mental State Exam 02/12/2016 02/10/2015  Orientation to time 5 5  Orientation to Place 5 5  Registration 3 3  Attention/ Calculation 5 5  Recall 3 3  Language- name 2 objects 2 2  Language- repeat 1 1  Language- follow 3 step command 3 3  Language- read & follow direction 1 1  Write a sentence 1 1  Copy design 1 1  Total score 30 30        Immunization History  Administered Date(s) Administered  . Influenza,inj,Quad PF,36+ Mos 10/21/2012  . Influenza-Unspecified 11/09/2013, 11/18/2014, 11/10/2015  . Pneumococcal Conjugate-13 12/10/2013  . Pneumococcal Polysaccharide-23 10/21/2009, 02/08/2015  . Tdap 06/12/2013  . Zoster 09/27/2015   Screening Tests Health Maintenance  Topic Date Due  . TETANUS/TDAP  06/13/2023  . INFLUENZA VACCINE  Completed  . ZOSTAVAX  Completed  . PNA vac Low Risk Adult   Completed      Plan:    End of life planning; Advance aging; Advanced directives discussed. Copy of current HCPOA/Living Will on file.  Medicare Attestation I have personally reviewed: The patient's medical and social history Their use of alcohol, tobacco or illicit drugs Their current medications and supplements The patient's functional ability including ADLs,fall risks, home safety risks, cognitive, and hearing and visual impairment Diet and physical activities Evidence for depression   The patient's weight, height, BMI, and visual acuity have been recorded in the chart.  I have made referrals and provided education to the patient based on review of the  above and I have provided the patient with a written personalized care plan for preventive services.    During the course of the visit the patient was educated and counseled about the following appropriate screening and preventive services:   Vaccines to include Pneumoccal, Influenza, Hepatitis B, Td, Zostavax, HCV  Electrocardiogram  Cardiovascular Disease  Colorectal cancer screening  Diabetes screening  Prostate Cancer Screening  Glaucoma screening  Nutrition counseling   Smoking cessation counseling  Patient Instructions (the written plan) was given to the patient.    Varney Biles, LPN  [id number]

## 2016-02-13 DIAGNOSIS — J3089 Other allergic rhinitis: Secondary | ICD-10-CM | POA: Diagnosis not present

## 2016-02-13 DIAGNOSIS — J301 Allergic rhinitis due to pollen: Secondary | ICD-10-CM | POA: Diagnosis not present

## 2016-02-13 NOTE — Progress Notes (Signed)
  I have reviewed the above information and agree with above.   Nichole Keltner, MD 

## 2016-02-20 DIAGNOSIS — J301 Allergic rhinitis due to pollen: Secondary | ICD-10-CM | POA: Diagnosis not present

## 2016-02-20 DIAGNOSIS — J3089 Other allergic rhinitis: Secondary | ICD-10-CM | POA: Diagnosis not present

## 2016-02-25 ENCOUNTER — Other Ambulatory Visit: Payer: Self-pay | Admitting: Cardiovascular Disease

## 2016-02-27 DIAGNOSIS — J3089 Other allergic rhinitis: Secondary | ICD-10-CM | POA: Diagnosis not present

## 2016-02-27 DIAGNOSIS — J301 Allergic rhinitis due to pollen: Secondary | ICD-10-CM | POA: Diagnosis not present

## 2016-03-05 DIAGNOSIS — J3089 Other allergic rhinitis: Secondary | ICD-10-CM | POA: Diagnosis not present

## 2016-03-05 DIAGNOSIS — J301 Allergic rhinitis due to pollen: Secondary | ICD-10-CM | POA: Diagnosis not present

## 2016-03-12 DIAGNOSIS — J301 Allergic rhinitis due to pollen: Secondary | ICD-10-CM | POA: Diagnosis not present

## 2016-03-12 DIAGNOSIS — B351 Tinea unguium: Secondary | ICD-10-CM | POA: Diagnosis not present

## 2016-03-12 DIAGNOSIS — M79674 Pain in right toe(s): Secondary | ICD-10-CM | POA: Diagnosis not present

## 2016-03-12 DIAGNOSIS — J3089 Other allergic rhinitis: Secondary | ICD-10-CM | POA: Diagnosis not present

## 2016-03-12 DIAGNOSIS — M79675 Pain in left toe(s): Secondary | ICD-10-CM | POA: Diagnosis not present

## 2016-03-15 DIAGNOSIS — H353211 Exudative age-related macular degeneration, right eye, with active choroidal neovascularization: Secondary | ICD-10-CM | POA: Diagnosis not present

## 2016-03-19 DIAGNOSIS — J301 Allergic rhinitis due to pollen: Secondary | ICD-10-CM | POA: Diagnosis not present

## 2016-03-19 DIAGNOSIS — J3089 Other allergic rhinitis: Secondary | ICD-10-CM | POA: Diagnosis not present

## 2016-03-26 DIAGNOSIS — J301 Allergic rhinitis due to pollen: Secondary | ICD-10-CM | POA: Diagnosis not present

## 2016-03-26 DIAGNOSIS — J3089 Other allergic rhinitis: Secondary | ICD-10-CM | POA: Diagnosis not present

## 2016-04-02 DIAGNOSIS — J301 Allergic rhinitis due to pollen: Secondary | ICD-10-CM | POA: Diagnosis not present

## 2016-04-02 DIAGNOSIS — J3089 Other allergic rhinitis: Secondary | ICD-10-CM | POA: Diagnosis not present

## 2016-04-09 DIAGNOSIS — J301 Allergic rhinitis due to pollen: Secondary | ICD-10-CM | POA: Diagnosis not present

## 2016-04-09 DIAGNOSIS — J3089 Other allergic rhinitis: Secondary | ICD-10-CM | POA: Diagnosis not present

## 2016-04-16 DIAGNOSIS — J3089 Other allergic rhinitis: Secondary | ICD-10-CM | POA: Diagnosis not present

## 2016-04-16 DIAGNOSIS — J301 Allergic rhinitis due to pollen: Secondary | ICD-10-CM | POA: Diagnosis not present

## 2016-04-23 DIAGNOSIS — J3089 Other allergic rhinitis: Secondary | ICD-10-CM | POA: Diagnosis not present

## 2016-04-23 DIAGNOSIS — J301 Allergic rhinitis due to pollen: Secondary | ICD-10-CM | POA: Diagnosis not present

## 2016-04-30 ENCOUNTER — Other Ambulatory Visit: Payer: Self-pay | Admitting: Internal Medicine

## 2016-04-30 DIAGNOSIS — J301 Allergic rhinitis due to pollen: Secondary | ICD-10-CM | POA: Diagnosis not present

## 2016-04-30 DIAGNOSIS — J3089 Other allergic rhinitis: Secondary | ICD-10-CM | POA: Diagnosis not present

## 2016-05-03 DIAGNOSIS — H353211 Exudative age-related macular degeneration, right eye, with active choroidal neovascularization: Secondary | ICD-10-CM | POA: Diagnosis not present

## 2016-05-07 DIAGNOSIS — J3089 Other allergic rhinitis: Secondary | ICD-10-CM | POA: Diagnosis not present

## 2016-05-07 DIAGNOSIS — J301 Allergic rhinitis due to pollen: Secondary | ICD-10-CM | POA: Diagnosis not present

## 2016-05-10 DIAGNOSIS — J3089 Other allergic rhinitis: Secondary | ICD-10-CM | POA: Diagnosis not present

## 2016-05-10 DIAGNOSIS — J301 Allergic rhinitis due to pollen: Secondary | ICD-10-CM | POA: Diagnosis not present

## 2016-05-14 DIAGNOSIS — J301 Allergic rhinitis due to pollen: Secondary | ICD-10-CM | POA: Diagnosis not present

## 2016-05-14 DIAGNOSIS — J3089 Other allergic rhinitis: Secondary | ICD-10-CM | POA: Diagnosis not present

## 2016-05-23 DIAGNOSIS — J3089 Other allergic rhinitis: Secondary | ICD-10-CM | POA: Diagnosis not present

## 2016-05-23 DIAGNOSIS — J301 Allergic rhinitis due to pollen: Secondary | ICD-10-CM | POA: Diagnosis not present

## 2016-05-28 DIAGNOSIS — J3089 Other allergic rhinitis: Secondary | ICD-10-CM | POA: Diagnosis not present

## 2016-05-28 DIAGNOSIS — J301 Allergic rhinitis due to pollen: Secondary | ICD-10-CM | POA: Diagnosis not present

## 2016-06-04 DIAGNOSIS — J301 Allergic rhinitis due to pollen: Secondary | ICD-10-CM | POA: Diagnosis not present

## 2016-06-04 DIAGNOSIS — J3089 Other allergic rhinitis: Secondary | ICD-10-CM | POA: Diagnosis not present

## 2016-06-11 DIAGNOSIS — J3089 Other allergic rhinitis: Secondary | ICD-10-CM | POA: Diagnosis not present

## 2016-06-11 DIAGNOSIS — J301 Allergic rhinitis due to pollen: Secondary | ICD-10-CM | POA: Diagnosis not present

## 2016-06-12 ENCOUNTER — Other Ambulatory Visit: Payer: Self-pay | Admitting: Internal Medicine

## 2016-06-13 DIAGNOSIS — J3089 Other allergic rhinitis: Secondary | ICD-10-CM | POA: Diagnosis not present

## 2016-06-13 DIAGNOSIS — J301 Allergic rhinitis due to pollen: Secondary | ICD-10-CM | POA: Diagnosis not present

## 2016-06-18 DIAGNOSIS — J301 Allergic rhinitis due to pollen: Secondary | ICD-10-CM | POA: Diagnosis not present

## 2016-06-18 DIAGNOSIS — J3081 Allergic rhinitis due to animal (cat) (dog) hair and dander: Secondary | ICD-10-CM | POA: Diagnosis not present

## 2016-06-25 DIAGNOSIS — J3089 Other allergic rhinitis: Secondary | ICD-10-CM | POA: Diagnosis not present

## 2016-06-25 DIAGNOSIS — J301 Allergic rhinitis due to pollen: Secondary | ICD-10-CM | POA: Diagnosis not present

## 2016-06-27 DIAGNOSIS — J301 Allergic rhinitis due to pollen: Secondary | ICD-10-CM | POA: Diagnosis not present

## 2016-06-29 ENCOUNTER — Other Ambulatory Visit: Payer: Self-pay | Admitting: Cardiovascular Disease

## 2016-07-01 DIAGNOSIS — H353211 Exudative age-related macular degeneration, right eye, with active choroidal neovascularization: Secondary | ICD-10-CM | POA: Diagnosis not present

## 2016-07-02 DIAGNOSIS — J301 Allergic rhinitis due to pollen: Secondary | ICD-10-CM | POA: Diagnosis not present

## 2016-07-02 DIAGNOSIS — J3089 Other allergic rhinitis: Secondary | ICD-10-CM | POA: Diagnosis not present

## 2016-07-04 ENCOUNTER — Encounter: Payer: Self-pay | Admitting: Cardiovascular Disease

## 2016-07-04 ENCOUNTER — Ambulatory Visit (INDEPENDENT_AMBULATORY_CARE_PROVIDER_SITE_OTHER): Payer: Medicare Other | Admitting: Cardiovascular Disease

## 2016-07-04 VITALS — BP 106/70 | HR 62 | Ht 70.0 in | Wt 202.8 lb

## 2016-07-04 DIAGNOSIS — I480 Paroxysmal atrial fibrillation: Secondary | ICD-10-CM | POA: Diagnosis not present

## 2016-07-04 DIAGNOSIS — E785 Hyperlipidemia, unspecified: Secondary | ICD-10-CM | POA: Diagnosis not present

## 2016-07-04 DIAGNOSIS — E781 Pure hyperglyceridemia: Secondary | ICD-10-CM | POA: Diagnosis not present

## 2016-07-04 DIAGNOSIS — I10 Essential (primary) hypertension: Secondary | ICD-10-CM

## 2016-07-04 DIAGNOSIS — I482 Chronic atrial fibrillation: Secondary | ICD-10-CM

## 2016-07-04 NOTE — Patient Instructions (Signed)
Medication Instructions:  Your physician recommends that you continue on your current medications as directed. Please refer to the Current Medication list given to you today.   Labwork: CBC, BMET, lipid, liver profile  Testing/Procedures: none  Follow-Up: Your physician wants you to follow-up in: 1 year with Dr. Fletcher Anon.  You will receive a reminder letter in the mail two months in advance. If you don't receive a letter, please call our office to schedule the follow-up appointment.   Any Other Special Instructions Will Be Listed Below (If Applicable).     If you need a refill on your cardiac medications before your next appointment, please call your pharmacy.

## 2016-07-04 NOTE — Progress Notes (Signed)
Cardiology Office Note   Date:  07/04/2016   ID:  [name], Todd Golden 08-Apr-1930, MRN [id number]  PCP:  Crecencio Mc, MD  Cardiologist:   Kathlyn Sacramento, MD   Chief Complaint  Patient presents with  . other    12 month follow up. Meds reviewed by the pt. verbally. "doing well."       History of Present Illness: Todd Golden is a 83 y.o. male who presents for a follow-up visit regarding paroxysmal atrial fibrillation. He was admitted at [facility] in November 2015 with expressive aphasia and mild ataxia due to leftMCA stroke.Carotid Doppler shows no obstructive disease. Echocardiogram showed normal LV systolic function with mildly dilated left atrium.  He was noted during his initial evaluation to be in atrial fibrillation and was started on Eliquis. No evidence of recurrent arrhythmia since then. He has been doing well and denies any chest pain, shortness of breath or palpitations. He enjoys living at [facility] [place]. He reports no side effects with anticoagulation.  Past Medical History:  Diagnosis Date  . Allergy   . Arthritis   . Asthma   . Cataract   . Chronic constipation   . Chronic kidney disease    stent  . Colon polyps   . Cough   . GERD (gastroesophageal reflux disease)   . Glaucoma   . HOH (hard of hearing)    AIDS  . Hyperlipidemia   . Hypertension   . Macular degeneration   . Stroke ([place])   . Ureteral stricture     Past Surgical History:  Procedure Laterality Date  . CATARACT EXTRACTION W/PHACO Left 12/20/2014   Procedure: CATARACT EXTRACTION PHACO AND INTRAOCULAR LENS PLACEMENT (IOC);  Surgeon: Birder Robson, MD;  Location: [facility] ORS;  Service: Ophthalmology;  Laterality: Left;   [place]     00:59.6 AP%    21.2 CDE    12.66 fluid pack lot #[id number]  . JOINT REPLACEMENT Left 2014   knee partial replacement  . kidney stent Right 1995     Current Outpatient Prescriptions  Medication Sig Dispense Refill  . amLODipine (NORVASC) 5 MG tablet take 1  tablet by mouth daily 90 tablet 1  . doxazosin (CARDURA) 4 MG tablet take 1 tablet by mouth once daily 30 tablet 3  . ELIQUIS 5 MG TABS tablet take 1 tablet by mouth twice a day 60 tablet 0  . EPIPEN 2-PAK 0.3 MG/0.3ML SOAJ injection Inject 2 mLs into the muscle as needed.    . hydrochlorothiazide (HYDRODIURIL) 25 MG tablet take 1 tablet by mouth once daily 90 tablet 0  . latanoprost (XALATAN) 0.005 % ophthalmic solution Place 1 drop into both eyes at bedtime.    . Multiple Vitamins-Minerals (PRESERVISION AREDS 2 PO) Take by mouth daily.    Marland Kitchen omeprazole (PRILOSEC) 40 MG capsule Take 1 capsule (40 mg total) by mouth daily. 90 capsule 3  . pindolol (VISKEN) 10 MG tablet take 1 tablet by mouth once daily 90 tablet 1  . simvastatin (ZOCOR) 20 MG tablet take 1 tablet by mouth once daily 90 tablet 1   No current facility-administered medications for this visit.     Allergies:   Patient has no known allergies.    Social History:  The patient  reports that he has never smoked. He has never used smokeless tobacco. He reports that he drinks alcohol. He reports that he does not use drugs.   Family History:  The patient's family history includes Arthritis in  his mother; Cancer (age of onset: 32) in his mother; Stroke in his father.    ROS:  Please see the history of present illness.   Otherwise, review of systems are positive for none.   All other systems are reviewed and negative.    PHYSICAL EXAM: VS:  BP 106/70 (BP Location: Left Arm, Patient Position: Sitting, Cuff Size: Normal)   Pulse 62   Ht 5\' 10"  (1.778 m)   Wt 202 lb 12 oz (92 kg)   BMI 29.09 kg/m  , BMI Body mass index is 29.09 kg/m. GEN: Well nourished, well developed, in no acute distress  HEENT: normal  Neck: no JVD, carotid bruits, or masses Cardiac: RRR; no murmurs, rubs, or gallops,no edema  Respiratory:  clear to auscultation bilaterally, normal work of breathing GI: soft, nontender, nondistended, + BS MS: no deformity or  atrophy  Skin: warm and dry, no rash Neuro:  Strength and sensation are intact Psych: euthymic mood, full affect   EKG:  EKG is  ordered today.  EKG showed normal sinus rhythm with left calf cyst deviation, moderate LVH.   Recent Labs: No results found for requested labs within last 8760 hours.    Lipid Panel    Component Value Date/Time   CHOL 151 07/27/2014 0835   CHOL 168 12/04/2013 0549   TRIG 86.0 07/27/2014 0835   TRIG 96 12/04/2013 0549   HDL 49.80 07/27/2014 0835   HDL 44 12/04/2013 0549   CHOLHDL 3 07/27/2014 0835   VLDL 17.2 07/27/2014 0835   VLDL 19 12/04/2013 0549   LDLCALC 84 07/27/2014 0835   LDLCALC 105 (H) 12/04/2013 0549      Wt Readings from Last 3 Encounters:  07/04/16 202 lb 12 oz (92 kg)  02/12/16 206 lb 12.8 oz (93.8 kg)  07/03/15 199 lb 8 oz (90.5 kg)        ASSESSMENT AND PLAN:  1.  Paroxysmal atrial fibrillation: No evidence of recurrent arrhythmia. He is tolerating anticoagulation without bleeding side effects.I requested CBC and basic metabolic profile.  2. Essential hypertension: Blood pressure is controlled on current medications.  3. Hyperlipidemia: Currently on simvastatin. I requested lipid and liver profile.    Disposition:   FU with me in 1 year  Signed,  Kathlyn Sacramento, MD  07/04/2016 9:47 AM    [place]

## 2016-07-05 LAB — BASIC METABOLIC PANEL
BUN/Creatinine Ratio: 23 (ref 10–24)
BUN: 20 mg/dL (ref 8–27)
CO2: 25 mmol/L (ref 20–29)
Calcium: 9.4 mg/dL (ref 8.6–10.2)
Chloride: 101 mmol/L (ref 96–106)
Creatinine, Ser: 0.88 mg/dL (ref 0.76–1.27)
GFR calc Af Amer: 92 mL/min/{1.73_m2} (ref >59)
GFR calc non Af Amer: 79 mL/min/{1.73_m2} (ref >59)
Glucose: 79 mg/dL (ref 65–99)
Potassium: 4.2 mmol/L (ref 3.5–5.2)
Sodium: 140 mmol/L (ref 134–144)

## 2016-07-05 LAB — HEPATIC FUNCTION PANEL
ALT: 29 IU/L (ref 0–44)
AST: 31 IU/L (ref 0–40)
Albumin: 4.4 g/dL (ref 3.5–4.7)
Alkaline Phosphatase: 55 IU/L (ref 39–117)
Bilirubin Total: 0.6 mg/dL (ref 0.0–1.2)
Bilirubin, Direct: 0.13 mg/dL (ref 0.00–0.40)
Total Protein: 7.2 g/dL (ref 6.0–8.5)

## 2016-07-05 LAB — CBC
Hematocrit: 38.9 % (ref 37.5–51.0)
Hemoglobin: 13 g/dL (ref 13.0–17.7)
MCH: 31 pg (ref 26.6–33.0)
MCHC: 33.4 g/dL (ref 31.5–35.7)
MCV: 93 fL (ref 79–97)
Platelets: 229 10*3/uL (ref 150–379)
RBC: 4.19 x10E6/uL (ref 4.14–5.80)
RDW: 13.7 % (ref 12.3–15.4)
WBC: 5.9 10*3/uL (ref 3.4–10.8)

## 2016-07-05 LAB — LIPID PANEL
Chol/HDL Ratio: 3.2 ratio (ref 0.0–5.0)
Cholesterol, Total: 145 mg/dL (ref 100–199)
HDL: 46 mg/dL (ref >39)
LDL Calculated: 71 mg/dL (ref 0–99)
Triglycerides: 139 mg/dL (ref 0–149)
VLDL Cholesterol Cal: 28 mg/dL (ref 5–40)

## 2016-07-09 DIAGNOSIS — J3089 Other allergic rhinitis: Secondary | ICD-10-CM | POA: Diagnosis not present

## 2016-07-09 DIAGNOSIS — J301 Allergic rhinitis due to pollen: Secondary | ICD-10-CM | POA: Diagnosis not present

## 2016-07-16 DIAGNOSIS — J3089 Other allergic rhinitis: Secondary | ICD-10-CM | POA: Diagnosis not present

## 2016-07-16 DIAGNOSIS — J301 Allergic rhinitis due to pollen: Secondary | ICD-10-CM | POA: Diagnosis not present

## 2016-07-21 ENCOUNTER — Other Ambulatory Visit: Payer: Self-pay | Admitting: Cardiovascular Disease

## 2016-07-22 ENCOUNTER — Other Ambulatory Visit: Payer: Self-pay | Admitting: Internal Medicine

## 2016-07-23 DIAGNOSIS — J3089 Other allergic rhinitis: Secondary | ICD-10-CM | POA: Diagnosis not present

## 2016-07-23 DIAGNOSIS — J301 Allergic rhinitis due to pollen: Secondary | ICD-10-CM | POA: Diagnosis not present

## 2016-07-25 ENCOUNTER — Other Ambulatory Visit: Payer: Self-pay | Admitting: Internal Medicine

## 2016-07-25 ENCOUNTER — Other Ambulatory Visit: Payer: Self-pay | Admitting: Cardiovascular Disease

## 2016-07-30 DIAGNOSIS — J3089 Other allergic rhinitis: Secondary | ICD-10-CM | POA: Diagnosis not present

## 2016-07-30 DIAGNOSIS — J301 Allergic rhinitis due to pollen: Secondary | ICD-10-CM | POA: Diagnosis not present

## 2016-08-06 ENCOUNTER — Other Ambulatory Visit: Payer: Self-pay | Admitting: Cardiovascular Disease

## 2016-08-06 DIAGNOSIS — J301 Allergic rhinitis due to pollen: Secondary | ICD-10-CM | POA: Diagnosis not present

## 2016-08-06 DIAGNOSIS — J3089 Other allergic rhinitis: Secondary | ICD-10-CM | POA: Diagnosis not present

## 2016-08-13 DIAGNOSIS — J3089 Other allergic rhinitis: Secondary | ICD-10-CM | POA: Diagnosis not present

## 2016-08-13 DIAGNOSIS — J301 Allergic rhinitis due to pollen: Secondary | ICD-10-CM | POA: Diagnosis not present

## 2016-08-20 DIAGNOSIS — J301 Allergic rhinitis due to pollen: Secondary | ICD-10-CM | POA: Diagnosis not present

## 2016-08-20 DIAGNOSIS — J3089 Other allergic rhinitis: Secondary | ICD-10-CM | POA: Diagnosis not present

## 2016-08-26 ENCOUNTER — Encounter: Payer: Self-pay | Admitting: Internal Medicine

## 2016-08-26 ENCOUNTER — Ambulatory Visit (INDEPENDENT_AMBULATORY_CARE_PROVIDER_SITE_OTHER): Payer: Medicare Other | Admitting: Internal Medicine

## 2016-08-26 DIAGNOSIS — H3530 Unspecified macular degeneration: Secondary | ICD-10-CM

## 2016-08-26 DIAGNOSIS — Z9849 Cataract extraction status, unspecified eye: Secondary | ICD-10-CM | POA: Diagnosis not present

## 2016-08-26 DIAGNOSIS — I10 Essential (primary) hypertension: Secondary | ICD-10-CM | POA: Diagnosis not present

## 2016-08-26 DIAGNOSIS — I480 Paroxysmal atrial fibrillation: Secondary | ICD-10-CM

## 2016-08-26 DIAGNOSIS — Z66 Do not resuscitate: Secondary | ICD-10-CM

## 2016-08-26 DIAGNOSIS — I679 Cerebrovascular disease, unspecified: Secondary | ICD-10-CM | POA: Diagnosis not present

## 2016-08-26 DIAGNOSIS — Z7189 Other specified counseling: Secondary | ICD-10-CM

## 2016-08-26 MED ORDER — PINDOLOL 5 MG PO TABS: 5.0000 mg | ORAL_TABLET | Freq: Every day | ORAL | 1 refills | Status: DC

## 2016-08-26 NOTE — Progress Notes (Signed)
Subjective:  Patient ID: Todd Golden, male    DOB: 1930-08-05  Age: 83 y.o. MRN: [id number]  CC: Diagnoses of Paroxysmal atrial fibrillation ([place]), History of cataract extraction, unspecified laterality, Age-related macular degeneration, Essential hypertension, benign, CVD (cerebrovascular disease), Counseling regarding end of life decision making, and Do not resuscitate status were pertinent to this visit.  HI: Todd Golden presents for follow up on hypertension, hyperlipidemia, PAF with history of embolic CVA ,  On Eliquis with prior CVA.  Has been lost to follow up since jan 2017, but his medications have been refilled without pause .  Recent labs were done by Dr Fletcher Anon during cardiology follow up.    Sine his last visit:  Diagnosed with macular degeneration , receiving injections for the past year by Porfilio at [facility].  Feels his vision has improved,  Reads a lot , vision feels worse in ARTIFICIAL light .  Still driving.   No recent falls  No fender benders.  Feels he has some mild balance issues  But is careful.   Sleeping fine,  Averages 8 to 9 hours per night. Takes an Occasional daytime nap after  lunch. Stopped working at his [employer] after she sold it and moved to [place], [place]. Worred about his daughter  Who has had persistent fatigue  and joint pain and recently diagnosed with  Lyme's disease which she developed from a tick bite on leg while here in  [facility]    Right hip no longer bothering him.  Exercising  3 to 4 days per week at [facility] using treadmill , and exercising and stretching his shoulders    Has stopped taking tylenol 650 mg on a  regular basis.  Averages one aleve per week   History of Left knee replacement.   Occasionally bothers him.   Moves bowels  daily with help of fiber supplement  [employer] version of metamucil    Outpatient Medications Prior to Visit  Medication Sig Dispense Refill  . amLODipine (NORVASC) 5 MG tablet take 1  tablet by mouth once daily 90 tablet 1  . doxazosin (CARDURA) 4 MG tablet take 1 tablet by mouth once daily 30 tablet 3  . ELIQUIS 5 MG TABS tablet take 1 tablet by mouth twice a day 60 tablet 0  . EPIPEN 2-PAK 0.3 MG/0.3ML SOAJ injection Inject 2 mLs into the muscle as needed.    . hydrochlorothiazide (HYDRODIURIL) 25 MG tablet take 1 tablet by mouth once daily 90 tablet 0  . latanoprost (XALATAN) 0.005 % ophthalmic solution Place 1 drop into both eyes at bedtime.    . Multiple Vitamins-Minerals (PRESERVISION AREDS 2 PO) Take by mouth daily.    Marland Kitchen omeprazole (PRILOSEC) 40 MG capsule Take 1 capsule (40 mg total) by mouth daily. 90 capsule 3  . simvastatin (ZOCOR) 20 MG tablet take 1 tablet by mouth once daily 90 tablet 1  . pindolol (VISKEN) 10 MG tablet take 1 tablet by mouth once daily 90 tablet 1  . amLODipine (NORVASC) 5 MG tablet take 1 tablet by mouth once daily (Patient not taking: Reported on 08/26/2016) 90 tablet 1   No facility-administered medications prior to visit.     Review of Systems;  Patient denies headache, fevers, malaise, unintentional weight loss, skin rash, eye pain, sinus congestion and sinus pain, sore throat, dysphagia,  hemoptysis , cough, dyspnea, wheezing, chest pain, palpitations, orthopnea, edema, abdominal pain, nausea, melena, diarrhea, constipation, flank pain, dysuria, hematuria, urinary  Frequency,  nocturia, numbness, tingling, seizures,  Focal weakness, Loss of consciousness,  Tremor, insomnia, depression, anxiety, and suicidal ideation.      Objective:  BP 108/72 (BP Location: Left Arm, Patient Position: Sitting, Cuff Size: Normal)   Pulse 60   Temp 97.6 F (36.4 C) (Oral)   Resp 15   Ht 5\' 10"  (1.778 m)   Wt 202 lb 12.8 oz (92 kg)   SpO2 97%   BMI 29.10 kg/m   BP Readings from Last 3 Encounters:  08/26/16 108/72  07/04/16 106/70  02/12/16 126/70    Wt Readings from Last 3 Encounters:  08/26/16 202 lb 12.8 oz (92 kg)  07/04/16 202 lb 12 oz  (92 kg)  02/12/16 206 lb 12.8 oz (93.8 kg)    General appearance: alert, cooperative and appears stated age Ears: normal TM's and external ear canals both ears Throat: lips, mucosa, and tongue normal; teeth and gums normal Neck: no adenopathy, no carotid bruit, supple, symmetrical, trachea midline and thyroid not enlarged, symmetric, no tenderness/mass/nodules Back: symmetric, no curvature. ROM normal. No CVA tenderness. Lungs: clear to auscultation bilaterally Heart: regular rate and rhythm, S1, S2 normal, no murmur, click, rub or gallop Abdomen: soft, non-tender; bowel sounds normal; no masses,  no organomegaly Pulses: 2+ and symmetric Skin: Skin color, texture, turgor normal. No rashes or lesions Lymph nodes: Cervical, supraclavicular, and axillary nodes normal.  Lab Results  Component Value Date   HGBA1C 5.9 12/04/2013   HGBA1C 5.9 12/03/2013    Lab Results  Component Value Date   CREATININE 0.88 07/04/2016   CREATININE 1.11 02/08/2015   CREATININE 1.04 07/27/2014    Lab Results  Component Value Date   WBC 5.9 07/04/2016   HGB 13.0 07/04/2016   HCT 38.9 07/04/2016   PLT 229 07/04/2016   GLUCOSE 79 07/04/2016   CHOL 145 07/04/2016   TRIG 139 07/04/2016   HDL 46 07/04/2016   LDLCALC 71 07/04/2016   ALT 29 07/04/2016   AST 31 07/04/2016   NA 140 07/04/2016   K 4.2 07/04/2016   CL 101 07/04/2016   CREATININE 0.88 07/04/2016   BUN 20 07/04/2016   CO2 25 07/04/2016   TSH 1.55 07/27/2014   PSA 2.97 10/21/2012   HGBA1C 5.9 12/04/2013    No results found.  Assessment & Plan:   Problem List Items Addressed This Visit    History of cataract surgery    Has had both cataracts removed,  Left in 2016. Right prior to that (Porfilio)       Essential hypertension, benign    BP is a little soft,  Given his age and incrased risk for falls due to orthostasis ,  Will reduce pindolol to 5 mg daily       Relevant Medications   pindolol (VISKEN) 5 MG tablet   Do not  resuscitate status    During the course of the visit , End of Life objectives were discussed at length,  And DNR orders singed in duplicate      CVD (cerebrovascular disease)    With marked improvement in Broca's aphasia .  His speech is now articulate.  Continue statin,  eliqiuis       Relevant Medications   pindolol (VISKEN) 5 MG tablet   Counseling regarding end of life decision making    DNR and MOST forms have been discussed per patient request and orders written for him and wife [name].       Atrial fibrillation ([place])  Rate controlled with pindolol, embolic stroke risk mitigated with Eliquis.  Bothered by low blood pressure and inability to get his heart rate up with exercise  Trial of reduced dose of pindolol to 5 mg daily       Relevant Medications   pindolol (VISKEN) 5 MG tablet   Age-related macular degeneration    Managed with I/O injections by Porfilio.  He is staill abel to read and drive.         A total of 40 minutes was spent with patient more than half of which was spent in counseling patient on the above mentioned issues , reviewing and explaining recent labs and imaging studies done, and coordination of care.  I have changed Mr. Degnan's pindolol. I am also having him maintain his latanoprost, EPIPEN 2-PAK, Multiple Vitamins-Minerals (PRESERVISION AREDS 2 PO), omeprazole, simvastatin, amLODipine, hydrochlorothiazide, doxazosin, and ELIQUIS.  Meds ordered this encounter  Medications  . pindolol (VISKEN) 5 MG tablet    Sig: Take 1 tablet (5 mg total) by mouth daily.    Dispense:  90 tablet    Refill:  1    KEEP ON FILE FOR FUTURE REFILLS    Medications Discontinued During This Encounter  Medication Reason  . amLODipine (NORVASC) 5 MG tablet Duplicate  . pindolol (VISKEN) 10 MG tablet Reorder    Follow-up: No Follow-up on file.   Crecencio Mc, MD

## 2016-08-26 NOTE — Patient Instructions (Addendum)
GOOD TO SEE YOU!!     We HAVE REDUCED YOUR PINDOLOL TO 5 MG DAILY    Health Maintenance, Male A healthy lifestyle and preventive care is important for your health and wellness. Ask your health care provider about what schedule of regular examinations is right for you. What should I know about weight and diet? Eat a Healthy Diet  Eat plenty of vegetables, fruits, whole grains, low-fat dairy products, and lean protein.  Do not eat a lot of foods high in solid fats, added sugars, or salt.  Maintain a Healthy Weight Regular exercise can help you achieve or maintain a healthy weight. You should:  Do at least 150 minutes of exercise each week. The exercise should increase your heart rate and make you sweat (moderate-intensity exercise).  Do strength-training exercises at least twice a week.  Watch Your Levels of Cholesterol and Blood Lipids  Have your blood tested for lipids and cholesterol every 5 years starting at 81 years of age. If you are at high risk for heart disease, you should start having your blood tested when you are 81 years old. You may need to have your cholesterol levels checked more often if: ? Your lipid or cholesterol levels are high. ? You are older than 81 years of age. ? You are at high risk for heart disease.  What should I know about cancer screening? Many types of cancers can be detected early and may often be prevented. Lung Cancer  You should be screened every year for lung cancer if: ? You are a current smoker who has smoked for at least 30 years. ? You are a former smoker who has quit within the past 15 years.  Talk to your health care provider about your screening options, when you should start screening, and how often you should be screened.  Colorectal Cancer  Routine colorectal cancer screening usually begins at 81 years of age and should be repeated every 5-10 years until you are 81 years old. You may need to be screened more often if early forms  of precancerous polyps or small growths are found. Your health care provider may recommend screening at an earlier age if you have risk factors for colon cancer.  Your health care provider may recommend using home test kits to check for hidden blood in the stool.  A small camera at the end of a tube can be used to examine your colon (sigmoidoscopy or colonoscopy). This checks for the earliest forms of colorectal cancer.  Prostate and Testicular Cancer  Depending on your age and overall health, your health care provider may do certain tests to screen for prostate and testicular cancer.  Talk to your health care provider about any symptoms or concerns you have about testicular or prostate cancer.  Skin Cancer  Check your skin from head to toe regularly.  Tell your health care provider about any new moles or changes in moles, especially if: ? There is a change in a mole's size, shape, or color. ? You have a mole that is larger than a pencil eraser.  Always use sunscreen. Apply sunscreen liberally and repeat throughout the day.  Protect yourself by wearing long sleeves, pants, a wide-brimmed hat, and sunglasses when outside.  What should I know about heart disease, diabetes, and high blood pressure?  If you are 80-53 years of age, have your blood pressure checked every 3-5 years. If you are 57 years of age or older, have your blood pressure checked every  year. You should have your blood pressure measured twice--once when you are at a hospital or clinic, and once when you are not at a hospital or clinic. Record the average of the two measurements. To check your blood pressure when you are not at a hospital or clinic, you can use: ? An automated blood pressure machine at a pharmacy. ? A home blood pressure monitor.  Talk to your health care provider about your target blood pressure.  If you are between 59-13 years old, ask your health care provider if you should take aspirin to prevent  heart disease.  Have regular diabetes screenings by checking your fasting blood sugar level. ? If you are at a normal weight and have a low risk for diabetes, have this test once every three years after the age of 61. ? If you are overweight and have a high risk for diabetes, consider being tested at a younger age or more often.  A one-time screening for abdominal aortic aneurysm (AAA) by ultrasound is recommended for men aged 15-75 years who are current or former smokers. What should I know about preventing infection? Hepatitis B If you have a higher risk for hepatitis B, you should be screened for this virus. Talk with your health care provider to find out if you are at risk for hepatitis B infection. Hepatitis C Blood testing is recommended for:  Everyone born from [birth year] through 1963.  Anyone with known risk factors for hepatitis C.  Sexually Transmitted Diseases (STDs)  You should be screened each year for STDs including gonorrhea and chlamydia if: ? You are sexually active and are younger than 81 years of age. ? You are older than 81 years of age and your health care provider tells you that you are at risk for this type of infection. ? Your sexual activity has changed since you were last screened and you are at an increased risk for chlamydia or gonorrhea. Ask your health care provider if you are at risk.  Talk with your health care provider about whether you are at high risk of being infected with HIV. Your health care provider may recommend a prescription medicine to help prevent HIV infection.  What else can I do?  Schedule regular health, dental, and eye exams.  Stay current with your vaccines (immunizations).  Do not use any tobacco products, such as cigarettes, chewing tobacco, and e-cigarettes. If you need help quitting, ask your health care provider.  Limit alcohol intake to no more than 2 drinks per day. One drink equals 12 ounces of beer, 5 ounces of wine, or 1 ounces  of hard liquor.  Do not use street drugs.  Do not share needles.  Ask your health care provider for help if you need support or information about quitting drugs.  Tell your health care provider if you often feel depressed.  Tell your health care provider if you have ever been abused or do not feel safe at home. This information is not intended to replace advice given to you by your health care provider. Make sure you discuss any questions you have with your health care provider. Document Released: 07/06/2007 Document Revised: 09/06/2015 Document Reviewed: 10/11/2014 Elsevier Interactive Patient Education  [employer].

## 2016-08-27 DIAGNOSIS — J3089 Other allergic rhinitis: Secondary | ICD-10-CM | POA: Diagnosis not present

## 2016-08-27 DIAGNOSIS — Z7189 Other specified counseling: Secondary | ICD-10-CM | POA: Insufficient documentation

## 2016-08-27 DIAGNOSIS — J301 Allergic rhinitis due to pollen: Secondary | ICD-10-CM | POA: Diagnosis not present

## 2016-08-27 DIAGNOSIS — H3530 Unspecified macular degeneration: Secondary | ICD-10-CM | POA: Insufficient documentation

## 2016-08-27 DIAGNOSIS — Z9849 Cataract extraction status, unspecified eye: Secondary | ICD-10-CM | POA: Insufficient documentation

## 2016-08-27 DIAGNOSIS — Z66 Do not resuscitate: Secondary | ICD-10-CM | POA: Insufficient documentation

## 2016-08-27 NOTE — Assessment & Plan Note (Signed)
During the course of the visit , End of Life objectives were discussed at length,  And DNR orders singed in duplicate

## 2016-08-27 NOTE — Assessment & Plan Note (Signed)
Managed with I/O injections by Porfilio.  He is staill abel to read and drive.

## 2016-08-27 NOTE — Assessment & Plan Note (Signed)
Rate controlled with pindolol, embolic stroke risk mitigated with Eliquis.  Bothered by low blood pressure and inability to get his heart rate up with exercise  Trial of reduced dose of pindolol to 5 mg daily

## 2016-08-27 NOTE — Assessment & Plan Note (Signed)
With marked improvement in Broca's aphasia .  His speech is now articulate.  Continue statin,  eliqiuis

## 2016-08-27 NOTE — Assessment & Plan Note (Signed)
Has had both cataracts removed,  Left in 2016. Right prior to that (Porfilio)

## 2016-08-27 NOTE — Assessment & Plan Note (Signed)
BP is a little soft,  Given his age and incrased risk for falls due to orthostasis ,  Will reduce pindolol to 5 mg daily

## 2016-08-27 NOTE — Assessment & Plan Note (Signed)
DNR and MOST forms have been discussed per patient request and orders written for him and wife [name].

## 2016-09-02 DIAGNOSIS — H353211 Exudative age-related macular degeneration, right eye, with active choroidal neovascularization: Secondary | ICD-10-CM | POA: Diagnosis not present

## 2016-09-03 DIAGNOSIS — J3089 Other allergic rhinitis: Secondary | ICD-10-CM | POA: Diagnosis not present

## 2016-09-03 DIAGNOSIS — J301 Allergic rhinitis due to pollen: Secondary | ICD-10-CM | POA: Diagnosis not present

## 2016-09-04 ENCOUNTER — Other Ambulatory Visit: Payer: Self-pay | Admitting: Cardiovascular Disease

## 2016-09-10 DIAGNOSIS — J301 Allergic rhinitis due to pollen: Secondary | ICD-10-CM | POA: Diagnosis not present

## 2016-09-10 DIAGNOSIS — J3089 Other allergic rhinitis: Secondary | ICD-10-CM | POA: Diagnosis not present

## 2016-09-17 DIAGNOSIS — J301 Allergic rhinitis due to pollen: Secondary | ICD-10-CM | POA: Diagnosis not present

## 2016-09-17 DIAGNOSIS — J3089 Other allergic rhinitis: Secondary | ICD-10-CM | POA: Diagnosis not present

## 2016-09-24 DIAGNOSIS — J301 Allergic rhinitis due to pollen: Secondary | ICD-10-CM | POA: Diagnosis not present

## 2016-09-24 DIAGNOSIS — J3089 Other allergic rhinitis: Secondary | ICD-10-CM | POA: Diagnosis not present

## 2016-10-01 DIAGNOSIS — J3089 Other allergic rhinitis: Secondary | ICD-10-CM | POA: Diagnosis not present

## 2016-10-01 DIAGNOSIS — J301 Allergic rhinitis due to pollen: Secondary | ICD-10-CM | POA: Diagnosis not present

## 2016-10-04 DIAGNOSIS — J3089 Other allergic rhinitis: Secondary | ICD-10-CM | POA: Diagnosis not present

## 2016-10-04 DIAGNOSIS — J301 Allergic rhinitis due to pollen: Secondary | ICD-10-CM | POA: Diagnosis not present

## 2016-10-08 ENCOUNTER — Other Ambulatory Visit: Payer: Self-pay | Admitting: Cardiovascular Disease

## 2016-10-08 ENCOUNTER — Other Ambulatory Visit: Payer: Self-pay | Admitting: Internal Medicine

## 2016-10-08 DIAGNOSIS — J301 Allergic rhinitis due to pollen: Secondary | ICD-10-CM | POA: Diagnosis not present

## 2016-10-08 DIAGNOSIS — J3089 Other allergic rhinitis: Secondary | ICD-10-CM | POA: Diagnosis not present

## 2016-10-15 DIAGNOSIS — J301 Allergic rhinitis due to pollen: Secondary | ICD-10-CM | POA: Diagnosis not present

## 2016-10-15 DIAGNOSIS — J3089 Other allergic rhinitis: Secondary | ICD-10-CM | POA: Diagnosis not present

## 2016-10-19 ENCOUNTER — Other Ambulatory Visit: Payer: Self-pay | Admitting: Internal Medicine

## 2016-10-22 DIAGNOSIS — J3089 Other allergic rhinitis: Secondary | ICD-10-CM | POA: Diagnosis not present

## 2016-10-22 DIAGNOSIS — J301 Allergic rhinitis due to pollen: Secondary | ICD-10-CM | POA: Diagnosis not present

## 2016-10-29 DIAGNOSIS — J3089 Other allergic rhinitis: Secondary | ICD-10-CM | POA: Diagnosis not present

## 2016-10-29 DIAGNOSIS — J301 Allergic rhinitis due to pollen: Secondary | ICD-10-CM | POA: Diagnosis not present

## 2016-10-31 DIAGNOSIS — J3089 Other allergic rhinitis: Secondary | ICD-10-CM | POA: Diagnosis not present

## 2016-10-31 DIAGNOSIS — J301 Allergic rhinitis due to pollen: Secondary | ICD-10-CM | POA: Diagnosis not present

## 2016-11-03 ENCOUNTER — Other Ambulatory Visit: Payer: Self-pay | Admitting: Internal Medicine

## 2016-11-04 DIAGNOSIS — H353211 Exudative age-related macular degeneration, right eye, with active choroidal neovascularization: Secondary | ICD-10-CM | POA: Diagnosis not present

## 2016-11-05 DIAGNOSIS — J301 Allergic rhinitis due to pollen: Secondary | ICD-10-CM | POA: Diagnosis not present

## 2016-11-05 DIAGNOSIS — J3089 Other allergic rhinitis: Secondary | ICD-10-CM | POA: Diagnosis not present

## 2016-11-07 DIAGNOSIS — J3089 Other allergic rhinitis: Secondary | ICD-10-CM | POA: Diagnosis not present

## 2016-11-07 DIAGNOSIS — J301 Allergic rhinitis due to pollen: Secondary | ICD-10-CM | POA: Diagnosis not present

## 2016-11-14 DIAGNOSIS — J301 Allergic rhinitis due to pollen: Secondary | ICD-10-CM | POA: Diagnosis not present

## 2016-11-14 DIAGNOSIS — J3089 Other allergic rhinitis: Secondary | ICD-10-CM | POA: Diagnosis not present

## 2016-11-14 DIAGNOSIS — Z23 Encounter for immunization: Secondary | ICD-10-CM | POA: Diagnosis not present

## 2016-11-19 DIAGNOSIS — J3089 Other allergic rhinitis: Secondary | ICD-10-CM | POA: Diagnosis not present

## 2016-11-19 DIAGNOSIS — J301 Allergic rhinitis due to pollen: Secondary | ICD-10-CM | POA: Diagnosis not present

## 2016-11-20 ENCOUNTER — Other Ambulatory Visit: Payer: Self-pay | Admitting: Cardiovascular Disease

## 2016-12-03 DIAGNOSIS — J301 Allergic rhinitis due to pollen: Secondary | ICD-10-CM | POA: Diagnosis not present

## 2016-12-03 DIAGNOSIS — J3089 Other allergic rhinitis: Secondary | ICD-10-CM | POA: Diagnosis not present

## 2016-12-10 DIAGNOSIS — J301 Allergic rhinitis due to pollen: Secondary | ICD-10-CM | POA: Diagnosis not present

## 2016-12-10 DIAGNOSIS — J3089 Other allergic rhinitis: Secondary | ICD-10-CM | POA: Diagnosis not present

## 2016-12-11 ENCOUNTER — Other Ambulatory Visit: Payer: Self-pay | Admitting: Internal Medicine

## 2016-12-17 DIAGNOSIS — J3089 Other allergic rhinitis: Secondary | ICD-10-CM | POA: Diagnosis not present

## 2016-12-17 DIAGNOSIS — J301 Allergic rhinitis due to pollen: Secondary | ICD-10-CM | POA: Diagnosis not present

## 2016-12-24 DIAGNOSIS — J301 Allergic rhinitis due to pollen: Secondary | ICD-10-CM | POA: Diagnosis not present

## 2016-12-24 DIAGNOSIS — J3089 Other allergic rhinitis: Secondary | ICD-10-CM | POA: Diagnosis not present

## 2016-12-31 DIAGNOSIS — J301 Allergic rhinitis due to pollen: Secondary | ICD-10-CM | POA: Diagnosis not present

## 2016-12-31 DIAGNOSIS — J3089 Other allergic rhinitis: Secondary | ICD-10-CM | POA: Diagnosis not present

## 2017-01-09 DIAGNOSIS — J301 Allergic rhinitis due to pollen: Secondary | ICD-10-CM | POA: Diagnosis not present

## 2017-01-09 DIAGNOSIS — J3089 Other allergic rhinitis: Secondary | ICD-10-CM | POA: Diagnosis not present

## 2017-01-10 DIAGNOSIS — H353131 Nonexudative age-related macular degeneration, bilateral, early dry stage: Secondary | ICD-10-CM | POA: Diagnosis not present

## 2017-01-10 DIAGNOSIS — H353211 Exudative age-related macular degeneration, right eye, with active choroidal neovascularization: Secondary | ICD-10-CM | POA: Diagnosis not present

## 2017-01-16 DIAGNOSIS — J3089 Other allergic rhinitis: Secondary | ICD-10-CM | POA: Diagnosis not present

## 2017-01-16 DIAGNOSIS — J301 Allergic rhinitis due to pollen: Secondary | ICD-10-CM | POA: Diagnosis not present

## 2017-01-22 ENCOUNTER — Other Ambulatory Visit: Payer: Self-pay | Admitting: Internal Medicine

## 2017-01-23 DIAGNOSIS — J3089 Other allergic rhinitis: Secondary | ICD-10-CM | POA: Diagnosis not present

## 2017-01-23 DIAGNOSIS — J301 Allergic rhinitis due to pollen: Secondary | ICD-10-CM | POA: Diagnosis not present

## 2017-01-28 DIAGNOSIS — J301 Allergic rhinitis due to pollen: Secondary | ICD-10-CM | POA: Diagnosis not present

## 2017-01-28 DIAGNOSIS — J3089 Other allergic rhinitis: Secondary | ICD-10-CM | POA: Diagnosis not present

## 2017-01-29 ENCOUNTER — Other Ambulatory Visit: Payer: Self-pay | Admitting: Cardiovascular Disease

## 2017-01-29 NOTE — Telephone Encounter (Signed)
Please review for refill, Thanks !  

## 2017-01-30 ENCOUNTER — Other Ambulatory Visit: Payer: Self-pay | Admitting: Internal Medicine

## 2017-02-04 DIAGNOSIS — J3089 Other allergic rhinitis: Secondary | ICD-10-CM | POA: Diagnosis not present

## 2017-02-04 DIAGNOSIS — J301 Allergic rhinitis due to pollen: Secondary | ICD-10-CM | POA: Diagnosis not present

## 2017-02-11 ENCOUNTER — Ambulatory Visit (INDEPENDENT_AMBULATORY_CARE_PROVIDER_SITE_OTHER): Payer: PPO

## 2017-02-11 VITALS — BP 120/80 | HR 63 | Temp 98.3°F | Resp 15 | Ht 70.0 in | Wt 205.8 lb

## 2017-02-11 DIAGNOSIS — J301 Allergic rhinitis due to pollen: Secondary | ICD-10-CM | POA: Diagnosis not present

## 2017-02-11 DIAGNOSIS — Z0000 Encounter for general adult medical examination without abnormal findings: Secondary | ICD-10-CM | POA: Diagnosis not present

## 2017-02-11 DIAGNOSIS — J3089 Other allergic rhinitis: Secondary | ICD-10-CM | POA: Diagnosis not present

## 2017-02-11 NOTE — Progress Notes (Signed)
Subjective:   Todd Golden is a 84 y.o. male who presents for Medicare Annual/Subsequent preventive examination.  Review of Systems:  No ROS.  Medicare Wellness Visit. Additional risk factors are reflected in the social history.  Cardiac Risk Factors include: advanced age (>21men, >30 women);male gender;hypertension     Objective:    Vitals: BP 120/80 (BP Location: Left Arm, Patient Position: Sitting, Cuff Size: Normal)   Pulse 63   Temp 98.3 F (36.8 C) (Oral)   Resp 15   Ht 5\' 10"  (1.778 m)   Wt 205 lb 12.8 oz (93.4 kg)   SpO2 96%   BMI 29.53 kg/m   Body mass index is 29.53 kg/m.  Advanced Directives 02/11/2017 02/12/2016 02/10/2015 12/20/2014 01/24/2014  Does Patient Have a Medical Advance Directive? Yes Yes Yes Yes Yes  Type of Paramedic of Todd Golden;Living will;Out of facility DNR (pink MOST or yellow form) Todd Golden;Living will [facility];Living will - Todd Golden;Living will  Does patient want to make changes to medical advance directive? No - Patient declined No - Patient declined No - Patient declined - -  Copy of [place] in Chart? Yes Yes No - copy requested - -    Tobacco Social History   Tobacco Use  Smoking Status Never Smoker  Smokeless Tobacco Never Used     Counseling given: Not Answered   Clinical Intake:  Pre-visit preparation completed: Yes  Pain : No/denies pain     Nutritional Status: BMI 25 -29 Overweight Diabetes: No  How often do you need to have someone help you when you read instructions, pamphlets, or other written materials from your doctor or pharmacy?: 1 - Never  Interpreter Needed?: No     Past Medical History:  Diagnosis Date  . Allergy   . Arthritis   . Asthma   . Cataract   . Chronic constipation   . Chronic kidney disease    stent  . Colon polyps   . Cough   . GERD (gastroesophageal reflux disease)   . Glaucoma     . HOH (hard of hearing)    AIDS  . Hyperlipidemia   . Hypertension   . Macular degeneration   . Stroke ([place])   . Ureteral stricture    Past Surgical History:  Procedure Laterality Date  . CATARACT EXTRACTION W/PHACO Left 12/20/2014   Procedure: CATARACT EXTRACTION PHACO AND INTRAOCULAR LENS PLACEMENT (IOC);  Surgeon: Todd Robson, MD;  Location: [facility] ORS;  Service: Ophthalmology;  Laterality: Left;   [place]     00:59.6 AP%    21.2 CDE    12.66 fluid pack lot #[id number]  . JOINT REPLACEMENT Left 2014   knee partial replacement  . kidney stent Right 1995   Family History  Problem Relation Age of Onset  . Arthritis Mother   . Cancer Mother 43       kidney  . Stroke Father    Social History   Socioeconomic History  . Marital status: Married    Spouse name: None  . Number of children: None  . Years of education: None  . Highest education level: None  Social Needs  . Financial resource strain: None  . Food insecurity - worry: None  . Food insecurity - inability: None  . Transportation needs - medical: None  . Transportation needs - non-medical: None  Occupational History  . None  Tobacco Use  . Smoking status: Never Smoker  .  Smokeless tobacco: Never Used  Substance and Sexual Activity  . Alcohol use: Yes    Comment: rarely  . Drug use: No  . Sexual activity: Not Currently  Other Topics Concern  . None  Social History Narrative  . None    Outpatient Encounter Medications as of 02/11/2017  Medication Sig  . amLODipine (NORVASC) 5 MG tablet take 1 tablet by mouth once daily  . doxazosin (CARDURA) 4 MG tablet take 1 tablet by mouth once daily  . ELIQUIS 5 MG TABS tablet take 1 tablet by mouth twice a day  . EPIPEN 2-PAK 0.3 MG/0.3ML SOAJ injection Inject 2 mLs into the muscle as needed.  . hydrochlorothiazide (HYDRODIURIL) 25 MG tablet take 1 tablet by mouth once daily  . latanoprost (XALATAN) 0.005 % ophthalmic solution Place 1 drop into both eyes at bedtime.   . Multiple Vitamins-Minerals (PRESERVISION AREDS 2 PO) Take by mouth daily.  Marland Kitchen omeprazole (PRILOSEC) 40 MG capsule take 1 capsule by mouth daily  . pindolol (VISKEN) 5 MG tablet Take 1 tablet (5 mg total) by mouth daily.  . simvastatin (ZOCOR) 20 MG tablet take 1 tablet by mouth once daily (NEED APPT FOR MORE REFILLS)  . [DISCONTINUED] doxazosin (CARDURA) 4 MG tablet take 1 tablet by mouth once daily  . [DISCONTINUED] pindolol (VISKEN) 10 MG tablet take 1 tablet by mouth once daily   No facility-administered encounter medications on file as of 02/11/2017.     Activities of Daily Living In your present state of health, do you have any difficulty performing the following activities: 02/11/2017 02/12/2016  Hearing? Y Y  Comment Hearing aids Hearing aids, bilateral  Vision? N N  Difficulty concentrating or making decisions? N N  Walking or climbing stairs? N N  Dressing or bathing? N N  Doing errands, shopping? N N  Preparing Food and eating ? N N  Using the Toilet? N N  In the past six months, have you accidently leaked urine? N N  Do you have problems with loss of bowel control? N N  Managing your Medications? N N  Managing your Finances? N N  Housekeeping or managing your Housekeeping? N N  Some recent data might be hidden    Patient Care Team: Todd Mc, MD as PCP - General (Internal Medicine)   Assessment:   This is a routine wellness examination for Todd Golden. The goal of the wellness visit is to assist the patient how to close the gaps in care and create a preventative care plan for the patient.   The roster of all physicians providing medical care to patient is listed in the Snapshot section of the chart.  Osteoporosis risk reviewed.    Safety issues reviewed; Smoke and carbon monoxide detectors in the home. No firearms in the home.  Wears seatbelts when driving or riding with others. Patient does wear sunscreen or protective clothing when in direct sunlight. No  violence in the home.  Depression- PHQ 2 &9 complete.  No signs/symptoms or verbal communication regarding little pleasure in doing things, feeling down, depressed or hopeless. No changes in sleeping, energy, eating, concentrating.  No thoughts of self harm or harm towards others.  Time spent on this topic is 10 minutes.   Patient is alert, normal appearance, oriented to person/place/and time. Correctly identified the president of the [place], recall of 2/3 words, and performing simple calculations. Displays appropriate judgement and can read correct time from watch face.   No new identified risk were noted.  No failures at ADL's or IADL's.    BMI- discussed the importance of a healthy diet, water intake and the benefits of aerobic exercise. Educational material provided.   24 hour diet recall: Regular diet  Daily fluid intake: 0 cups of caffeine, 6-8 cups of water  Dental- every 6 months.  Dr.Harris.  Eye- Visual acuity not assessed per patient preference since they have regular follow up with the ophthalmologist.  Wears corrective lenses.  Sleep patterns- Sleeps 8 hours at night.  Wakes feeling rested.   Health maintenance gaps- closed.  Patient Concerns: None at this time. Follow up with PCP as needed.  Exercise Activities and Dietary recommendations Current Exercise Habits: Home exercise routine, Time (Minutes): 30, Frequency (Times/Week): 3, Weekly Exercise (Minutes/Week): 90, Intensity: Mild  Goals    . Increase physical activity     Add 2 more days of walking to exercise regimen       Fall Risk Fall Risk  02/11/2017 02/12/2016 02/10/2015 02/08/2015 01/24/2014  Falls in the past year? Yes No No No No   Depression Screen PHQ 2/9 Scores 02/11/2017 02/12/2016 02/10/2015 02/08/2015  PHQ - 2 Score 0 0 0 0  PHQ- 9 Score 0 - - -    Cognitive Function MMSE - Mini Mental State Exam 02/12/2016 02/10/2015  Orientation to time 5 5  Orientation to Place 5 5  Registration 3 3  Attention/  Calculation 5 5  Recall 3 3  Language- name 2 objects 2 2  Language- repeat 1 1  Language- follow 3 step command 3 3  Language- read & follow direction 1 1  Write a sentence 1 1  Copy design 1 1  Total score 30 30     6CIT Screen 02/11/2017  What Year? 0 points  What month? 0 points  What time? 0 points  Count back from 20 0 points  Months in reverse 0 points  Repeat phrase 0 points  Total Score 0    Immunization History  Administered Date(s) Administered  . Influenza,inj,Quad PF,6+ Mos 10/21/2012  . Influenza-Unspecified 11/09/2013, 11/18/2014, 11/10/2015  . Pneumococcal Conjugate-13 12/10/2013  . Pneumococcal Polysaccharide-23 10/21/2009, 02/08/2015  . Tdap 06/12/2013  . Zoster 09/27/2015   Screening Tests Health Maintenance  Topic Date Due  . TETANUS/TDAP  06/13/2023  . INFLUENZA VACCINE  Completed  . PNA vac Low Risk Adult  Completed      Plan:    End of life planning; Advance aging; Advanced directives discussed. Copy of current HCPOA/Living Will on file.    I have personally reviewed and noted the following in the patient's chart:   . Medical and social history . Use of alcohol, tobacco or illicit drugs  . Current medications and supplements . Functional ability and status . Nutritional status . Physical activity . Advanced directives . List of other physicians . Hospitalizations, surgeries, and ER visits in previous 12 months . Vitals . Screenings to include cognitive, depression, and falls . Referrals and appointments  In addition, I have reviewed and discussed with patient certain preventive protocols, quality metrics, and best practice recommendations. A written personalized care plan for preventive services as well as general preventive health recommendations were provided to patient.     OBrien-Blaney, Seleen Walter L, LPN  0/27/2536   I have reviewed the above information and agree with above.   Deborra Medina, MD

## 2017-02-11 NOTE — Patient Instructions (Addendum)
  Todd Golden , Thank you for taking time to come for your Medicare Wellness Visit. I appreciate your ongoing commitment to your health goals. Please review the following plan we discussed and let me know if I can assist you in the future.   Follow up with Dr. Derrel Nip as needed.    Have a great day!  These are the goals we discussed: Goals    . Increase physical activity     Add 2 more days of walking to exercise regimen       This is a list of the screening recommended for you and due dates:  Health Maintenance  Topic Date Due  . Tetanus Vaccine  06/13/2023  . Flu Shot  Completed  . Pneumonia vaccines  Completed

## 2017-02-18 DIAGNOSIS — J3089 Other allergic rhinitis: Secondary | ICD-10-CM | POA: Diagnosis not present

## 2017-02-18 DIAGNOSIS — J301 Allergic rhinitis due to pollen: Secondary | ICD-10-CM | POA: Diagnosis not present

## 2017-02-27 DIAGNOSIS — J3089 Other allergic rhinitis: Secondary | ICD-10-CM | POA: Diagnosis not present

## 2017-02-27 DIAGNOSIS — J301 Allergic rhinitis due to pollen: Secondary | ICD-10-CM | POA: Diagnosis not present

## 2017-03-04 DIAGNOSIS — J301 Allergic rhinitis due to pollen: Secondary | ICD-10-CM | POA: Diagnosis not present

## 2017-03-04 DIAGNOSIS — J3089 Other allergic rhinitis: Secondary | ICD-10-CM | POA: Diagnosis not present

## 2017-03-11 DIAGNOSIS — J301 Allergic rhinitis due to pollen: Secondary | ICD-10-CM | POA: Diagnosis not present

## 2017-03-11 DIAGNOSIS — J3089 Other allergic rhinitis: Secondary | ICD-10-CM | POA: Diagnosis not present

## 2017-03-18 DIAGNOSIS — J3089 Other allergic rhinitis: Secondary | ICD-10-CM | POA: Diagnosis not present

## 2017-03-18 DIAGNOSIS — J301 Allergic rhinitis due to pollen: Secondary | ICD-10-CM | POA: Diagnosis not present

## 2017-03-21 DIAGNOSIS — H353131 Nonexudative age-related macular degeneration, bilateral, early dry stage: Secondary | ICD-10-CM | POA: Diagnosis not present

## 2017-03-25 DIAGNOSIS — J301 Allergic rhinitis due to pollen: Secondary | ICD-10-CM | POA: Diagnosis not present

## 2017-03-25 DIAGNOSIS — J3089 Other allergic rhinitis: Secondary | ICD-10-CM | POA: Diagnosis not present

## 2017-04-01 DIAGNOSIS — J3089 Other allergic rhinitis: Secondary | ICD-10-CM | POA: Diagnosis not present

## 2017-04-01 DIAGNOSIS — J301 Allergic rhinitis due to pollen: Secondary | ICD-10-CM | POA: Diagnosis not present

## 2017-04-03 DIAGNOSIS — J3089 Other allergic rhinitis: Secondary | ICD-10-CM | POA: Diagnosis not present

## 2017-04-03 DIAGNOSIS — J301 Allergic rhinitis due to pollen: Secondary | ICD-10-CM | POA: Diagnosis not present

## 2017-04-08 DIAGNOSIS — J3089 Other allergic rhinitis: Secondary | ICD-10-CM | POA: Diagnosis not present

## 2017-04-08 DIAGNOSIS — J301 Allergic rhinitis due to pollen: Secondary | ICD-10-CM | POA: Diagnosis not present

## 2017-04-10 DIAGNOSIS — J301 Allergic rhinitis due to pollen: Secondary | ICD-10-CM | POA: Diagnosis not present

## 2017-04-10 DIAGNOSIS — J3089 Other allergic rhinitis: Secondary | ICD-10-CM | POA: Diagnosis not present

## 2017-04-15 DIAGNOSIS — J3089 Other allergic rhinitis: Secondary | ICD-10-CM | POA: Diagnosis not present

## 2017-04-15 DIAGNOSIS — J301 Allergic rhinitis due to pollen: Secondary | ICD-10-CM | POA: Diagnosis not present

## 2017-04-17 DIAGNOSIS — J301 Allergic rhinitis due to pollen: Secondary | ICD-10-CM | POA: Diagnosis not present

## 2017-04-22 DIAGNOSIS — J301 Allergic rhinitis due to pollen: Secondary | ICD-10-CM | POA: Diagnosis not present

## 2017-04-22 DIAGNOSIS — J3089 Other allergic rhinitis: Secondary | ICD-10-CM | POA: Diagnosis not present

## 2017-04-25 ENCOUNTER — Other Ambulatory Visit: Payer: Self-pay | Admitting: Internal Medicine

## 2017-04-29 DIAGNOSIS — J301 Allergic rhinitis due to pollen: Secondary | ICD-10-CM | POA: Diagnosis not present

## 2017-04-29 DIAGNOSIS — J3089 Other allergic rhinitis: Secondary | ICD-10-CM | POA: Diagnosis not present

## 2017-05-13 DIAGNOSIS — J301 Allergic rhinitis due to pollen: Secondary | ICD-10-CM | POA: Diagnosis not present

## 2017-05-13 DIAGNOSIS — J3089 Other allergic rhinitis: Secondary | ICD-10-CM | POA: Diagnosis not present

## 2017-05-19 ENCOUNTER — Other Ambulatory Visit: Payer: Self-pay

## 2017-05-19 MED ORDER — APIXABAN 5 MG PO TABS: 5.0000 mg | ORAL_TABLET | Freq: Two times a day (BID) | ORAL | 3 refills | Status: DC

## 2017-05-19 NOTE — Telephone Encounter (Signed)
Please review for refill.  

## 2017-05-19 NOTE — Telephone Encounter (Signed)
Please review for refill. Thanks!  

## 2017-05-20 DIAGNOSIS — J3089 Other allergic rhinitis: Secondary | ICD-10-CM | POA: Diagnosis not present

## 2017-05-20 DIAGNOSIS — J301 Allergic rhinitis due to pollen: Secondary | ICD-10-CM | POA: Diagnosis not present

## 2017-05-27 DIAGNOSIS — J301 Allergic rhinitis due to pollen: Secondary | ICD-10-CM | POA: Diagnosis not present

## 2017-05-27 DIAGNOSIS — J3089 Other allergic rhinitis: Secondary | ICD-10-CM | POA: Diagnosis not present

## 2017-05-30 DIAGNOSIS — H353131 Nonexudative age-related macular degeneration, bilateral, early dry stage: Secondary | ICD-10-CM | POA: Diagnosis not present

## 2017-06-03 DIAGNOSIS — J301 Allergic rhinitis due to pollen: Secondary | ICD-10-CM | POA: Diagnosis not present

## 2017-06-03 DIAGNOSIS — J3089 Other allergic rhinitis: Secondary | ICD-10-CM | POA: Diagnosis not present

## 2017-06-10 DIAGNOSIS — J301 Allergic rhinitis due to pollen: Secondary | ICD-10-CM | POA: Diagnosis not present

## 2017-06-10 DIAGNOSIS — J3089 Other allergic rhinitis: Secondary | ICD-10-CM | POA: Diagnosis not present

## 2017-06-17 ENCOUNTER — Other Ambulatory Visit: Payer: Self-pay | Admitting: Internal Medicine

## 2017-06-19 DIAGNOSIS — J301 Allergic rhinitis due to pollen: Secondary | ICD-10-CM | POA: Diagnosis not present

## 2017-06-19 DIAGNOSIS — J3089 Other allergic rhinitis: Secondary | ICD-10-CM | POA: Diagnosis not present

## 2017-06-23 ENCOUNTER — Other Ambulatory Visit: Payer: Self-pay | Admitting: *Deleted

## 2017-06-23 MED ORDER — DOXAZOSIN MESYLATE 4 MG PO TABS: 4.0000 mg | ORAL_TABLET | Freq: Every day | ORAL | 0 refills | Status: DC

## 2017-06-24 DIAGNOSIS — J301 Allergic rhinitis due to pollen: Secondary | ICD-10-CM | POA: Diagnosis not present

## 2017-06-24 DIAGNOSIS — J3089 Other allergic rhinitis: Secondary | ICD-10-CM | POA: Diagnosis not present

## 2017-07-04 DIAGNOSIS — J3089 Other allergic rhinitis: Secondary | ICD-10-CM | POA: Diagnosis not present

## 2017-07-04 DIAGNOSIS — J301 Allergic rhinitis due to pollen: Secondary | ICD-10-CM | POA: Diagnosis not present

## 2017-07-08 DIAGNOSIS — J301 Allergic rhinitis due to pollen: Secondary | ICD-10-CM | POA: Diagnosis not present

## 2017-07-08 DIAGNOSIS — J3089 Other allergic rhinitis: Secondary | ICD-10-CM | POA: Diagnosis not present

## 2017-07-14 ENCOUNTER — Telehealth: Payer: Self-pay | Admitting: Internal Medicine

## 2017-07-14 NOTE — Telephone Encounter (Signed)
Pt is requesting a refill on his pindolol (VISKEN) 5 MG tablet. He only has a few more days left of his medication. Pharmacy is [facility].

## 2017-07-15 DIAGNOSIS — J301 Allergic rhinitis due to pollen: Secondary | ICD-10-CM | POA: Diagnosis not present

## 2017-07-15 DIAGNOSIS — J3089 Other allergic rhinitis: Secondary | ICD-10-CM | POA: Diagnosis not present

## 2017-07-15 MED ORDER — PINDOLOL 5 MG PO TABS: 5.0000 mg | ORAL_TABLET | Freq: Every day | ORAL | 0 refills | Status: DC

## 2017-07-15 NOTE — Telephone Encounter (Signed)
Medication has been refilled and sent to [facility]

## 2017-07-16 ENCOUNTER — Other Ambulatory Visit: Payer: Self-pay | Admitting: Cardiovascular Disease

## 2017-07-22 DIAGNOSIS — J3089 Other allergic rhinitis: Secondary | ICD-10-CM | POA: Diagnosis not present

## 2017-07-22 DIAGNOSIS — J301 Allergic rhinitis due to pollen: Secondary | ICD-10-CM | POA: Diagnosis not present

## 2017-07-23 ENCOUNTER — Ambulatory Visit (INDEPENDENT_AMBULATORY_CARE_PROVIDER_SITE_OTHER): Payer: PPO | Admitting: Nurse Practitioner

## 2017-07-23 ENCOUNTER — Encounter: Payer: Self-pay | Admitting: Nurse Practitioner

## 2017-07-23 VITALS — BP 112/68 | HR 66 | Ht 68.0 in | Wt 201.5 lb

## 2017-07-23 DIAGNOSIS — I10 Essential (primary) hypertension: Secondary | ICD-10-CM | POA: Diagnosis not present

## 2017-07-23 DIAGNOSIS — E785 Hyperlipidemia, unspecified: Secondary | ICD-10-CM | POA: Diagnosis not present

## 2017-07-23 DIAGNOSIS — I480 Paroxysmal atrial fibrillation: Secondary | ICD-10-CM | POA: Diagnosis not present

## 2017-07-23 NOTE — Patient Instructions (Signed)
Medication Instructions:  Your physician recommends that you continue on your current medications as directed. Please refer to the Current Medication list given to you today.   Labwork: Your physician recommends that you return for a FASTING lipid profile, cbc, and cmet.  Please have your labs drawn at the [facility] building   Testing/Procedures: None ordered  Follow-Up: Your physician wants you to follow-up in: 1 year with Dr.Arida You will receive a reminder letter in the mail two months in advance. If you don't receive a letter, please call our office to schedule the follow-up appointment.   Any Other Special Instructions Will Be Listed Below (If Applicable).     If you need a refill on your cardiac medications before your next appointment, please call your pharmacy.

## 2017-07-23 NOTE — Progress Notes (Signed)
Office Visit    Patient Name: Todd Golden Date of Encounter: 07/23/2017  Primary Care Provider:  Crecencio Mc, MD Primary Cardiologist:  Kathlyn Sacramento, MD  Chief Complaint    84 year old male with a history of paroxysmal atrial for ablation, stroke, hypertension, hyperlipidemia, and glaucoma, who presents for annual follow-up.  Past Medical History    Past Medical History:  Diagnosis Date  . Allergy   . Arthritis   . Asthma   . Cataract   . Chronic constipation   . Colon polyps   . Cough   . GERD (gastroesophageal reflux disease)   . Glaucoma   . HOH (hard of hearing)    a. Uses hearing aids  . Hyperlipidemia   . Hypertension   . Macular degeneration   . Nephrolithiasis    stent  . PAF (paroxysmal atrial fibrillation) ([place])    a. 11/2013 Dx in setting of L MCA stroke. CHA2DS2VASc = 5-->Eliquis; b. 11/2013 Echo: Nl LV fxn. Mildly dil LA.  . Stroke ([place])    a. 11/2013 L MCA stroke.  Marland Kitchen Ureteral stricture    Past Surgical History:  Procedure Laterality Date  . CATARACT EXTRACTION W/PHACO Left 12/20/2014   Procedure: CATARACT EXTRACTION PHACO AND INTRAOCULAR LENS PLACEMENT (IOC);  Surgeon: Birder Robson, MD;  Location: [facility] ORS;  Service: Ophthalmology;  Laterality: Left;   [place]     00:59.6 AP%    21.2 CDE    12.66 fluid pack lot #[id number]  . JOINT REPLACEMENT Left 2014   knee partial replacement  . kidney stent Right 1995    Allergies  No Known Allergies  History of Present Illness    84 y/o ? with the above past medical history including paroxysmal atrial fibrillation, stroke, hypertension, hyperlipidemia, and glaucoma.  In November 2015, he was admitted with expressive aphasia and mild ataxia due to left MCA stroke  in the setting of atrial fibrillation.  Echocardiogram showed normal LV function well carotid Doppler showed no obstructive disease.  He was placed on Eliquis and has had no recurrence of A. fib over the years.  He was last seen 1 year  ago and since then has continued to do well.  He remains relatively active, going to the gym 2-3 times per week with his wife and also gardening frequently.  He denies chest pain, palpitations, PND, orthopnea, dyspnea, dizziness, syncope, edema, or early satiety.  Home Medications    Prior to Admission medications   Medication Sig Start Date End Date Taking? Authorizing Provider  amLODipine (NORVASC) 5 MG tablet take 1 tablet by mouth once daily 01/22/17  Yes Crecencio Mc, MD  apixaban (ELIQUIS) 5 MG TABS tablet Take 1 tablet (5 mg total) by mouth 2 (two) times daily. 05/19/17  Yes Wellington Hampshire, MD  doxazosin (CARDURA) 4 MG tablet TAKE 1 TABLET BY MOUTH DAILY 07/16/17  Yes Arida, Mertie Clause, MD  EPIPEN 2-PAK 0.3 MG/0.3ML SOAJ injection Inject 2 mLs into the muscle as needed. 08/05/12  Yes [provider]  hydrochlorothiazide (HYDRODIURIL) 25 MG tablet TAKE 1 TABLET BY MOUTH ONCE DAILY 04/25/17  Yes Crecencio Mc, MD  latanoprost (XALATAN) 0.005 % ophthalmic solution Place 1 drop into both eyes at bedtime. 10/03/12  Yes [provider]  Multiple Vitamins-Minerals (PRESERVISION AREDS 2 PO) Take by mouth daily.   Yes [provider]  omeprazole (PRILOSEC) 40 MG capsule take 1 capsule by mouth daily 10/21/16  Yes Crecencio Mc, MD  pindolol (  VISKEN) 5 MG tablet Take 1 tablet (5 mg total) by mouth daily. 07/15/17  Yes Crecencio Mc, MD  simvastatin (ZOCOR) 20 MG tablet TAKE 1 TABLET BY MOUTH ONCE DAILY(NEED APPOINTMENT FOR MORE REFILLS) 06/17/17  Yes Crecencio Mc, MD    Review of Systems    As above, he has been doing well over the past year.  He denies chest pain, palpitations, dyspnea, pnd, orthopnea, n, v, dizziness, syncope, edema, weight gain, or early satiety.  All other systems reviewed and are otherwise negative except as noted above.  Physical Exam    VS:  BP 112/68 (BP Location: Left Arm, Patient Position: Sitting, Cuff Size: Normal)   Pulse 66   Ht 5'  8" (1.727 m)   Wt 201 lb 8 oz (91.4 kg)   BMI 30.64 kg/m  , BMI Body mass index is 30.64 kg/m. GEN: Well nourished, well developed, in no acute distress.  HEENT: normal.  Neck: Supple, no JVD, carotid bruits, or masses. Cardiac: RRR, no murmurs, rubs, or gallops. No clubbing, cyanosis, edema.  Radials/DP/PT 2+ and equal bilaterally.  Respiratory:  Respirations regular and unlabored, clear to auscultation bilaterally. GI: Soft, nontender, nondistended, BS + x 4. MS: no deformity or atrophy. Skin: warm and dry, no rash. Neuro:  Strength and sensation are intact. Psych: Normal affect.  Accessory Clinical Findings    ECG -regular sinus rhythm, 66, left axis deviation, no acute ST or T changes.  Assessment & Plan    1.  Paroxysmal atrial fibrillation: This was documented in 2015 in the setting of left MCA stroke.  He has been doing well over the past year since his last visit without palpitations.  He is in sinus rhythm today.  Continue beta-blocker and Eliquis.  It has been a year since his last lab work and I will follow-up a CBC and complete metabolic panel.  2.  Essential hypertension: Stable on beta-blocker, HCTZ, and amlodipine therapy.  Following up metabolic panel today.  3.  Hyperlipidemia: LDL in June 2018 was 71.  He remains on simvastatin therapy.  Follow-up lipids and LFTs.  4.  Disposition: Follow-up lab work.  Follow-up with Dr. Fletcher Anon in 1 year or sooner if necessary.   Murray Hodgkins, NP 07/23/2017, 4:16 PM

## 2017-07-27 ENCOUNTER — Other Ambulatory Visit: Payer: Self-pay | Admitting: Internal Medicine

## 2017-07-29 ENCOUNTER — Other Ambulatory Visit
Admission: RE | Admit: 2017-07-29 | Discharge: 2017-07-29 | Disposition: A | Discharge status: Home / Self Care | Payer: PPO | Source: Ambulatory Visit | Attending: Nurse Practitioner | Admitting: Nurse Practitioner

## 2017-07-29 DIAGNOSIS — J301 Allergic rhinitis due to pollen: Secondary | ICD-10-CM | POA: Diagnosis not present

## 2017-07-29 DIAGNOSIS — J3089 Other allergic rhinitis: Secondary | ICD-10-CM | POA: Diagnosis not present

## 2017-07-29 DIAGNOSIS — E785 Hyperlipidemia, unspecified: Secondary | ICD-10-CM

## 2017-07-29 DIAGNOSIS — I10 Essential (primary) hypertension: Secondary | ICD-10-CM | POA: Insufficient documentation

## 2017-07-29 LAB — COMPREHENSIVE METABOLIC PANEL
ALT: 22 U/L (ref 0–44)
AST: 29 U/L (ref 15–41)
Albumin: 4 g/dL (ref 3.5–5.0)
Alkaline Phosphatase: 49 U/L (ref 38–126)
Anion gap: 7 (ref 5–15)
BUN: 21 mg/dL (ref 8–23)
CO2: 29 mmol/L (ref 22–32)
Calcium: 8.9 mg/dL (ref 8.9–10.3)
Chloride: 104 mmol/L (ref 98–111)
Creatinine, Ser: 1.01 mg/dL (ref 0.61–1.24)
GFR calc Af Amer: >60 mL/min (ref >60)
GFR calc non Af Amer: >60 mL/min (ref >60)
Glucose, Bld: 118 mg/dL — ABNORMAL HIGH (ref 70–99)
Potassium: 3.7 mmol/L (ref 3.5–5.1)
Sodium: 140 mmol/L (ref 135–145)
Total Bilirubin: 1 mg/dL (ref 0.3–1.2)
Total Protein: 7.2 g/dL (ref 6.5–8.1)

## 2017-07-29 LAB — CBC WITH DIFFERENTIAL/PLATELET
Basophils Absolute: 0 10*3/uL (ref 0–0.1)
Basophils Relative: 0 %
Eosinophils Absolute: 0.2 10*3/uL (ref 0–0.7)
Eosinophils Relative: 3 %
HCT: 39.8 % — ABNORMAL LOW (ref 40.0–52.0)
Hemoglobin: 13.6 g/dL (ref 13.0–18.0)
Lymphocytes Relative: 30 %
Lymphs Abs: 1.7 10*3/uL (ref 1.0–3.6)
MCH: 30.8 pg (ref 26.0–34.0)
MCHC: 34.1 g/dL (ref 32.0–36.0)
MCV: 90.3 fL (ref 80.0–100.0)
Monocytes Absolute: 0.7 10*3/uL (ref 0.2–1.0)
Monocytes Relative: 13 %
Neutro Abs: 3 10*3/uL (ref 1.4–6.5)
Neutrophils Relative %: 54 %
Platelets: 210 10*3/uL (ref 150–440)
RBC: 4.4 MIL/uL (ref 4.40–5.90)
RDW: 13.1 % (ref 11.5–14.5)
WBC: 5.6 10*3/uL (ref 3.8–10.6)

## 2017-07-29 LAB — LIPID PANEL
Cholesterol: 135 mg/dL (ref 0–200)
HDL: 48 mg/dL (ref >40)
LDL Cholesterol: 72 mg/dL (ref 0–99)
Total CHOL/HDL Ratio: 2.8 RATIO
Triglycerides: 74 mg/dL (ref <150)
VLDL: 15 mg/dL (ref 0–40)

## 2017-07-29 NOTE — Addendum Note (Signed)
Addended by: Santiago Bur on: 07/29/2017 08:08 AM   Modules accepted: Orders

## 2017-07-29 NOTE — Addendum Note (Signed)
Addended by: Santiago Bur on: 07/29/2017 08:09 AM   Modules accepted: Orders

## 2017-08-05 DIAGNOSIS — J301 Allergic rhinitis due to pollen: Secondary | ICD-10-CM | POA: Diagnosis not present

## 2017-08-05 DIAGNOSIS — J3089 Other allergic rhinitis: Secondary | ICD-10-CM | POA: Diagnosis not present

## 2017-08-08 DIAGNOSIS — J301 Allergic rhinitis due to pollen: Secondary | ICD-10-CM | POA: Diagnosis not present

## 2017-08-08 DIAGNOSIS — J3089 Other allergic rhinitis: Secondary | ICD-10-CM | POA: Diagnosis not present

## 2017-08-12 DIAGNOSIS — J3089 Other allergic rhinitis: Secondary | ICD-10-CM | POA: Diagnosis not present

## 2017-08-12 DIAGNOSIS — J301 Allergic rhinitis due to pollen: Secondary | ICD-10-CM | POA: Diagnosis not present

## 2017-08-13 ENCOUNTER — Encounter: Payer: Self-pay | Admitting: Internal Medicine

## 2017-08-13 ENCOUNTER — Ambulatory Visit (INDEPENDENT_AMBULATORY_CARE_PROVIDER_SITE_OTHER): Payer: PPO | Admitting: Internal Medicine

## 2017-08-13 VITALS — BP 126/74 | HR 63 | Temp 97.9°F | Resp 15 | Ht 68.0 in | Wt 202.0 lb

## 2017-08-13 DIAGNOSIS — R7301 Impaired fasting glucose: Secondary | ICD-10-CM

## 2017-08-13 DIAGNOSIS — R4701 Aphasia: Secondary | ICD-10-CM | POA: Diagnosis not present

## 2017-08-13 DIAGNOSIS — L03012 Cellulitis of left finger: Secondary | ICD-10-CM

## 2017-08-13 DIAGNOSIS — H3530 Unspecified macular degeneration: Secondary | ICD-10-CM | POA: Diagnosis not present

## 2017-08-13 DIAGNOSIS — I10 Essential (primary) hypertension: Secondary | ICD-10-CM | POA: Diagnosis not present

## 2017-08-13 DIAGNOSIS — Z974 Presence of external hearing-aid: Secondary | ICD-10-CM

## 2017-08-13 DIAGNOSIS — Z0000 Encounter for general adult medical examination without abnormal findings: Secondary | ICD-10-CM

## 2017-08-13 MED ORDER — ZOSTER VAC RECOMB ADJUVANTED 50 MCG/0.5ML IM SUSR: 0.5000 mL | Freq: Once | INTRAMUSCULAR | 1 refills | Status: AC

## 2017-08-13 MED ORDER — CEPHALEXIN 500 MG PO CAPS: 500.0000 mg | ORAL_CAPSULE | Freq: Three times a day (TID) | ORAL | 0 refills | Status: DC

## 2017-08-13 NOTE — Progress Notes (Signed)
Patient ID: Todd Golden, male    DOB: 29-Jan-1930  Age: 84 y.o. MRN: [id number]  The patient is here for annual preventiveexamination and management of other chronic and acute problems.   Patient has advanced directive and DNR ordewre lives at [facility].    The risk factors are reflected in the social history.  The roster of all physicians providing medical care to patient - is listed in the Snapshot section of the chart.  Activities of daily living:  The patient is 100% independent in all ADLs: dressing, toileting, feeding as well as independent mobility  Lab Results  Component Value Date   HGBA1C 5.9 12/04/2013     Home safety : The patient has smoke detectors in the home. They wear seatbelts.  There are no firearms at home. There is no violence in the home.   There is no risks for hepatitis, STDs or HIV. There is no   history of blood transfusion. They have no travel history to infectious disease endemic areas of the world.  The patient has seen their dentist in the last six month. They have seen their eye doctor in the last year. TDespite wearing a hearing aid in both ears (for the past ten years )  He continues to have to slight hearing difficulty with regard to whispered voices and some television programs.  They have deferred audiologic testing in the last year.  They do not  have excessive sun exposure. Discussed the need for sun protection: hats, long sleeves and use of sunscreen if there is significant sun exposure.   Diet: the importance of a healthy diet is discussed. They do have a healthy diet.Mostly vegetables,  chicken and fish  Drinks alcohol once or twice per week at the most. .   The benefits of regular aerobic exercise were discussed. He goes to the gym 3 to 4 times per week.  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  One fall in the kitchen 6 months ago,  No injuries.  Notes his balance is not good, keeps  catching the skin on hiw elbow on cabinet doors and handles.   Depression screen: there are no signs or vegative symptoms of depression- irritability, change in appetite, anhedonia, sadness/tearfullness.  Cognitive assessment: the patient manages all their financial and personal affairs and is actively engaged. They could relate day,date,year and events; recalled 2/3 objects at 3 minutes; performed clock-face test normally.  The following portions of the patient's history were reviewed and updated as appropriate: allergies, current medications, past family history, past medical history,  past surgical history, past social history  and problem list.  Visual acuity was not assessed per patient preference since she has regular follow up with her ophthalmologist. Hearing and body mass index were assessed and reviewed.   During the course of the visit the patient was educated and counseled about appropriate screening and preventive services including : fall prevention , diabetes screening, nutrition counseling, colorectal cancer screening, and recommended immunizations.    CC: The primary encounter diagnosis was Impaired fasting blood sugar. Diagnoses of Hearing aid worn, Paronychia of finger, left, Essential hypertension, benign, Broca's aphasia, Age-related macular degeneration, and Encounter for preventive health examination were also pertinent to this visit.   Still getting allergy shots twice weekly at [facility].  Does not require antihistamines  PAF managed by [facility] Constipation managed with fiber diet.  And prn dulcolax  Lack of sex, wife has no interest No longer seeing podiatry for foot problem, acquired pes cavus causing pain in toes bilaterally,  still an issue, orthotomy didn't help.  Has  Shooting pains under left great toe randomly occurring .  Flexor tenotomy offered byt deferred    Wears a hearing aid for the past 10 years   Numbness of thumb and index on left hand. Only    Recent labs reviewed,  Needs an a1c. Likes sweets  developed pus and swelling of the cuticle on his left index fingner 2 days ago   Mixes up words during conversation . Post CVA.  retired   Uses a pill box  [name] his right  eye injected for treatment of macular degeneration this week. Every 12 weeks.    History Todd Golden has a past medical history of Allergy, Arthritis, Asthma, Cataract, Chronic constipation, Colon polyps, Cough, GERD (gastroesophageal reflux disease), Glaucoma, HOH (hard of hearing), Hyperlipidemia, Hypertension, Macular degeneration, Nephrolithiasis, PAF (paroxysmal atrial fibrillation) ([place]), Stroke ([place]), and Ureteral stricture.   He has a past surgical history that includes Joint replacement (Left, 2014); kidney stent (Right, 1995); and Cataract extraction w/PHACO (Left, 12/20/2014).   His family history includes Arthritis in his mother; Cancer (age of onset: 70) in his mother; Stroke in his father.He reports that he has never smoked. He has never used smokeless tobacco. He reports that he drinks alcohol. He reports that he does not use drugs.  Outpatient Medications Prior to Visit  Medication Sig Dispense Refill  . amLODipine (NORVASC) 5 MG tablet take 1 tablet by mouth once daily 90 tablet 1  . apixaban (ELIQUIS) 5 MG TABS tablet Take 1 tablet (5 mg total) by mouth 2 (two) times daily. 60 tablet 3  . doxazosin (CARDURA) 4 MG tablet TAKE 1 TABLET BY MOUTH DAILY 30 tablet 3  . EPIPEN 2-PAK 0.3 MG/0.3ML SOAJ injection Inject 2 mLs into the muscle as needed.    . hydrochlorothiazide (HYDRODIURIL) 25 MG tablet TAKE 1 TABLET BY MOUTH ONCE DAILY 90 tablet 1  . latanoprost (XALATAN) 0.005 % ophthalmic solution Place 1 drop into both eyes at bedtime.    . Multiple Vitamins-Minerals (PRESERVISION AREDS 2 PO) Take by mouth daily.    Marland Kitchen omeprazole (PRILOSEC) 40 MG capsule take 1 capsule by mouth daily 90 capsule 3  . pindolol (VISKEN) 5 MG tablet Take 1 tablet (5  mg total) by mouth daily. 90 tablet 0  . simvastatin (ZOCOR) 20 MG tablet TAKE 1 TABLET BY MOUTH ONCE DAILY(NEED APPOINTMENT FOR MORE REFILLS) 90 tablet 1   No facility-administered medications prior to visit.     Review of Systems   Patient denies headache, fevers, malaise, unintentional weight loss, skin rash, eye pain, sinus congestion and sinus pain, sore throat, dysphagia,  hemoptysis , cough, dyspnea, wheezing, chest pain, palpitations, orthopnea, edema, abdominal pain, nausea, melena, diarrhea, constipation, flank pain, dysuria, hematuria, urinary  Frequency, nocturia, numbness, tingling, seizures,  Focal weakness, Loss of consciousness,  Tremor, insomnia, depression, anxiety, and suicidal ideation.      Objective:  BP 126/74 (BP Location: Left Arm, Patient Position: Sitting, Cuff Size: Normal)   Pulse 63   Temp 97.9 F (36.6 C) (Oral)   Resp 15   Ht 5\' 8"  (1.727 m)   Wt 202 lb (91.6 kg)   SpO2 97%   BMI 30.71 kg/m   Physical Exam   General appearance: alert, cooperative and appears stated age Ears: bilateral hearing aids.  normal TM's and external ear canals both ears Throat: lips, mucosa, and tongue normal; teeth and gums normal Neck: no adenopathy, no carotid bruit, supple, symmetrical, trachea midline and thyroid not enlarged, symmetric, no tenderness/mass/nodules Back: symmetric, no curvature. ROM normal. No CVA tenderness. Lungs: clear to auscultation bilaterally Heart: regular rate and rhythm, S1, S2 normal, no murmur, click, rub or gallop Abdomen: soft, non-tender; bowel sounds normal; no masses,  no organomegaly Pulses: 2+ and symmetric Skin: Skin color, texture, turgor normal. No rashes or lesions Lymph nodes: Cervical, supraclavicular, and axillary nodes normal.    Assessment & Plan:   Problem List Items Addressed This Visit    Paronychia of finger, left    Prescribing keflex tid x 7 days .  Probiotic advised       Relevant Medications   cephALEXin  (KEFLEX) 500 MG capsule   Hearing aid worn   Essential hypertension, benign    Well controlled on current regimen. Renal function stable, no changes today.  Lab Results  Component Value Date   CREATININE 1.01 07/29/2017   Lab Results  Component Value Date   NA 140 07/29/2017   K 3.7 07/29/2017   CL 104 07/29/2017   CO2 29 07/29/2017         Encounter for preventive health examination    Annual comprehensive preventive eExam was done as well as an evaluation and management of acute and chronic conditions .  During the course of the visit the patient was educated and counseled about appropriate services including :  Advanced directives,  Living wills, and power of attorneys granted . DNR and MOST forms have been discussed per patient request and orders written for him and wife Todd Golden.       Broca's aphasia    IMPROVED since hishmorrhagic stroke,  But has occasional problems with work finding,  He is articulate today      Age-related macular degeneration    Managed with I/O injections by Porfilio.  He is staill able to read and drive.        Other Visit Diagnoses    Impaired fasting blood sugar    -  Primary   Relevant Orders   POCT HgB A1C      I am having Todd Golden start on Zoster Vaccine Adjuvanted and cephALEXin. I am also having him maintain his latanoprost, EPIPEN 2-PAK, Multiple Vitamins-Minerals (PRESERVISION AREDS 2 PO), omeprazole, amLODipine, apixaban, simvastatin, pindolol, doxazosin, and hydrochlorothiazide.  Meds ordered this encounter  Medications  . Zoster Vaccine Adjuvanted [facility]) injection    Sig: Inject 0.5 mLs into the muscle once for 1 dose.    Dispense:  1 each    Refill:  1  . cephALEXin (KEFLEX) 500 MG capsule    Sig: Take 1 capsule (500 mg total) by mouth 3 (three) times daily.    Dispense:  21 capsule    Refill:  0    There are no discontinued medications.  Follow-up: Return in about 1 year (around 08/14/2018).   Crecencio Mc, MD

## 2017-08-13 NOTE — Patient Instructions (Addendum)
The ShingRx vaccine is now available in local pharmacies and is much more protective thant Zostavaxs,  It is therefore ADVISED for all interested adults over 50 to prevent shingles    I am prescribing keflex  For your nail infection (paronychia)  . Take it 3 times daily for one week.   Please take a probiotic ( Align, Floraque or Culturelle) of the generic version of one of these  For a minimum of 3 weeks to prevent a serious antibiotic associated diarrhea  Called clostridium dificile colitis   I am checking you for diabetes again since your recent fasting glucose was elevated   Health Maintenance, Male A healthy lifestyle and preventive care is important for your health and wellness. Ask your health care provider about what schedule of regular examinations is right for you. What should I know about weight and diet? Eat a Healthy Diet  Eat plenty of vegetables, fruits, whole grains, low-fat dairy products, and lean protein.  Do not eat a lot of foods high in solid fats, added sugars, or salt.  Maintain a Healthy Weight Regular exercise can help you achieve or maintain a healthy weight. You should:  Do at least 150 minutes of exercise each week. The exercise should increase your heart rate and make you sweat (moderate-intensity exercise).  Do strength-training exercises at least twice a week.  Watch Your Levels of Cholesterol and Blood Lipids  Have your blood tested for lipids and cholesterol every 5 years starting at 82 years of age. If you are at high risk for heart disease, you should start having your blood tested when you are 82 years old. You may need to have your cholesterol levels checked more often if: ? Your lipid or cholesterol levels are high. ? You are older than 82 years of age. ? You are at high risk for heart disease.  What should I know about cancer screening? Many types of cancers can be detected early and may often be prevented. Lung Cancer  You should be  screened every year for lung cancer if: ? You are a current smoker who has smoked for at least 30 years. ? You are a former smoker who has quit within the past 15 years.  Talk to your health care provider about your screening options, when you should start screening, and how often you should be screened.  Colorectal Cancer  Routine colorectal cancer screening usually begins at 82 years of age and should be repeated every 5-10 years until you are 82 years old. You may need to be screened more often if early forms of precancerous polyps or small growths are found. Your health care provider may recommend screening at an earlier age if you have risk factors for colon cancer.  Your health care provider may recommend using home test kits to check for hidden blood in the stool.  A small camera at the end of a tube can be used to examine your colon (sigmoidoscopy or colonoscopy). This checks for the earliest forms of colorectal cancer.  Prostate and Testicular Cancer  Depending on your age and overall health, your health care provider may do certain tests to screen for prostate and testicular cancer.  Talk to your health care provider about any symptoms or concerns you have about testicular or prostate cancer.  Skin Cancer  Check your skin from head to toe regularly.  Tell your health care provider about any new moles or changes in moles, especially if: ? There is a change in  a mole's size, shape, or color. ? You have a mole that is larger than a pencil eraser.  Always use sunscreen. Apply sunscreen liberally and repeat throughout the day.  Protect yourself by wearing long sleeves, pants, a wide-brimmed hat, and sunglasses when outside.  What should I know about heart disease, diabetes, and high blood pressure?  If you are 23-21 years of age, have your blood pressure checked every 3-5 years. If you are 80 years of age or older, have your blood pressure checked every year. You should have  your blood pressure measured twice-once when you are at a hospital or clinic, and once when you are not at a hospital or clinic. Record the average of the two measurements. To check your blood pressure when you are not at a hospital or clinic, you can use: ? An automated blood pressure machine at a pharmacy. ? A home blood pressure monitor.  Talk to your health care provider about your target blood pressure.  If you are between 91-64 years old, ask your health care provider if you should take aspirin to prevent heart disease.  Have regular diabetes screenings by checking your fasting blood sugar level. ? If you are at a normal weight and have a low risk for diabetes, have this test once every three years after the age of 57. ? If you are overweight and have a high risk for diabetes, consider being tested at a younger age or more often.  A one-time screening for abdominal aortic aneurysm (AAA) by ultrasound is recommended for men aged 65-75 years who are current or former smokers. What should I know about preventing infection? Hepatitis B If you have a higher risk for hepatitis B, you should be screened for this virus. Talk with your health care provider to find out if you are at risk for hepatitis B infection. Hepatitis C Blood testing is recommended for:  Everyone born from [birth year] through 1963.  Anyone with known risk factors for hepatitis C.  Sexually Transmitted Diseases (STDs)  You should be screened each year for STDs including gonorrhea and chlamydia if: ? You are sexually active and are younger than 82 years of age. ? You are older than 82 years of age and your health care provider tells you that you are at risk for this type of infection. ? Your sexual activity has changed since you were last screened and you are at an increased risk for chlamydia or gonorrhea. Ask your health care provider if you are at risk.  Talk with your health care provider about whether you are at high risk  of being infected with HIV. Your health care provider may recommend a prescription medicine to help prevent HIV infection.  What else can I do?  Schedule regular health, dental, and eye exams.  Stay current with your vaccines (immunizations).  Do not use any tobacco products, such as cigarettes, chewing tobacco, and e-cigarettes. If you need help quitting, ask your health care provider.  Limit alcohol intake to no more than 2 drinks per day. One drink equals 12 ounces of beer, 5 ounces of wine, or 1 ounces of hard liquor.  Do not use street drugs.  Do not share needles.  Ask your health care provider for help if you need support or information about quitting drugs.  Tell your health care provider if you often feel depressed.  Tell your health care provider if you have ever been abused or do not feel safe at home. This information  is not intended to replace advice given to you by your health care provider. Make sure you discuss any questions you have with your health care provider. Document Released: 07/06/2007 Document Revised: 09/06/2015 Document Reviewed: 10/11/2014 Elsevier Interactive Patient Education  [employer].

## 2017-08-15 DIAGNOSIS — H353131 Nonexudative age-related macular degeneration, bilateral, early dry stage: Secondary | ICD-10-CM | POA: Diagnosis not present

## 2017-08-16 DIAGNOSIS — R4701 Aphasia: Secondary | ICD-10-CM | POA: Insufficient documentation

## 2017-08-16 DIAGNOSIS — Z974 Presence of external hearing-aid: Secondary | ICD-10-CM | POA: Insufficient documentation

## 2017-08-16 DIAGNOSIS — L03012 Cellulitis of left finger: Secondary | ICD-10-CM | POA: Insufficient documentation

## 2017-08-16 NOTE — Assessment & Plan Note (Signed)
Prescribing keflex tid x 7 days .  Probiotic advised

## 2017-08-16 NOTE — Assessment & Plan Note (Signed)
Annual comprehensive preventive eExam was done as well as an evaluation and management of acute and chronic conditions .  During the course of the visit the patient was educated and counseled about appropriate services including :  Advanced directives,  Living wills, and power of attorneys granted . DNR and MOST forms have been discussed per patient request and orders written for him and wife Todd Golden.

## 2017-08-16 NOTE — Assessment & Plan Note (Signed)
Managed with I/O injections by Porfilio.  He is staill able to read and drive.

## 2017-08-16 NOTE — Assessment & Plan Note (Signed)
IMPROVED since hishmorrhagic stroke,  But has occasional problems with work finding,  He is articulate today

## 2017-08-16 NOTE — Assessment & Plan Note (Signed)
Well controlled on current regimen. Renal function stable, no changes today.  Lab Results  Component Value Date   CREATININE 1.01 07/29/2017   Lab Results  Component Value Date   NA 140 07/29/2017   K 3.7 07/29/2017   CL 104 07/29/2017   CO2 29 07/29/2017

## 2017-08-21 DIAGNOSIS — J301 Allergic rhinitis due to pollen: Secondary | ICD-10-CM | POA: Diagnosis not present

## 2017-08-21 DIAGNOSIS — J3089 Other allergic rhinitis: Secondary | ICD-10-CM | POA: Diagnosis not present

## 2017-08-26 DIAGNOSIS — J3089 Other allergic rhinitis: Secondary | ICD-10-CM | POA: Diagnosis not present

## 2017-08-26 DIAGNOSIS — J301 Allergic rhinitis due to pollen: Secondary | ICD-10-CM | POA: Diagnosis not present

## 2017-09-02 DIAGNOSIS — J3089 Other allergic rhinitis: Secondary | ICD-10-CM | POA: Diagnosis not present

## 2017-09-02 DIAGNOSIS — J301 Allergic rhinitis due to pollen: Secondary | ICD-10-CM | POA: Diagnosis not present

## 2017-09-09 DIAGNOSIS — J301 Allergic rhinitis due to pollen: Secondary | ICD-10-CM | POA: Diagnosis not present

## 2017-09-09 DIAGNOSIS — J3089 Other allergic rhinitis: Secondary | ICD-10-CM | POA: Diagnosis not present

## 2017-09-12 DIAGNOSIS — J3089 Other allergic rhinitis: Secondary | ICD-10-CM | POA: Diagnosis not present

## 2017-09-12 DIAGNOSIS — J301 Allergic rhinitis due to pollen: Secondary | ICD-10-CM | POA: Diagnosis not present

## 2017-09-14 ENCOUNTER — Other Ambulatory Visit: Payer: Self-pay | Admitting: Cardiovascular Disease

## 2017-09-15 NOTE — Telephone Encounter (Signed)
Please review for refill, Thanks !  

## 2017-09-16 DIAGNOSIS — J3089 Other allergic rhinitis: Secondary | ICD-10-CM | POA: Diagnosis not present

## 2017-09-16 DIAGNOSIS — J301 Allergic rhinitis due to pollen: Secondary | ICD-10-CM | POA: Diagnosis not present

## 2017-09-18 DIAGNOSIS — J3089 Other allergic rhinitis: Secondary | ICD-10-CM | POA: Diagnosis not present

## 2017-09-18 DIAGNOSIS — J301 Allergic rhinitis due to pollen: Secondary | ICD-10-CM | POA: Diagnosis not present

## 2017-09-23 ENCOUNTER — Other Ambulatory Visit: Payer: Self-pay | Admitting: Internal Medicine

## 2017-09-23 DIAGNOSIS — J3089 Other allergic rhinitis: Secondary | ICD-10-CM | POA: Diagnosis not present

## 2017-09-23 DIAGNOSIS — J301 Allergic rhinitis due to pollen: Secondary | ICD-10-CM | POA: Diagnosis not present

## 2017-09-30 DIAGNOSIS — J3089 Other allergic rhinitis: Secondary | ICD-10-CM | POA: Diagnosis not present

## 2017-09-30 DIAGNOSIS — J301 Allergic rhinitis due to pollen: Secondary | ICD-10-CM | POA: Diagnosis not present

## 2017-10-07 DIAGNOSIS — J301 Allergic rhinitis due to pollen: Secondary | ICD-10-CM | POA: Diagnosis not present

## 2017-10-07 DIAGNOSIS — J3089 Other allergic rhinitis: Secondary | ICD-10-CM | POA: Diagnosis not present

## 2017-10-08 ENCOUNTER — Other Ambulatory Visit: Payer: Self-pay | Admitting: Internal Medicine

## 2017-10-14 DIAGNOSIS — J3089 Other allergic rhinitis: Secondary | ICD-10-CM | POA: Diagnosis not present

## 2017-10-14 DIAGNOSIS — J301 Allergic rhinitis due to pollen: Secondary | ICD-10-CM | POA: Diagnosis not present

## 2017-10-21 ENCOUNTER — Telehealth: Payer: Self-pay | Admitting: Internal Medicine

## 2017-10-21 DIAGNOSIS — J301 Allergic rhinitis due to pollen: Secondary | ICD-10-CM | POA: Diagnosis not present

## 2017-10-21 DIAGNOSIS — J3089 Other allergic rhinitis: Secondary | ICD-10-CM | POA: Diagnosis not present

## 2017-10-21 NOTE — Telephone Encounter (Signed)
Pt needs a refill on omeprazole (PRILOSEC) 40 MG capsule sent to [facility] [place] Pt is out

## 2017-10-22 MED ORDER — OMEPRAZOLE 40 MG PO CPDR: 40.0000 mg | DELAYED_RELEASE_CAPSULE | Freq: Every day | ORAL | 1 refills | Status: DC

## 2017-10-22 NOTE — Telephone Encounter (Signed)
Spoke with pt to let him know that the medication has been refilled.

## 2017-10-28 DIAGNOSIS — J301 Allergic rhinitis due to pollen: Secondary | ICD-10-CM | POA: Diagnosis not present

## 2017-10-28 DIAGNOSIS — J3089 Other allergic rhinitis: Secondary | ICD-10-CM | POA: Diagnosis not present

## 2017-11-04 DIAGNOSIS — J3089 Other allergic rhinitis: Secondary | ICD-10-CM | POA: Diagnosis not present

## 2017-11-04 DIAGNOSIS — J301 Allergic rhinitis due to pollen: Secondary | ICD-10-CM | POA: Diagnosis not present

## 2017-11-11 DIAGNOSIS — J3089 Other allergic rhinitis: Secondary | ICD-10-CM | POA: Diagnosis not present

## 2017-11-11 DIAGNOSIS — J301 Allergic rhinitis due to pollen: Secondary | ICD-10-CM | POA: Diagnosis not present

## 2017-11-12 DIAGNOSIS — H353211 Exudative age-related macular degeneration, right eye, with active choroidal neovascularization: Secondary | ICD-10-CM | POA: Diagnosis not present

## 2017-11-13 ENCOUNTER — Other Ambulatory Visit: Payer: Self-pay | Admitting: Cardiovascular Disease

## 2017-11-18 ENCOUNTER — Other Ambulatory Visit: Payer: Self-pay | Admitting: Internal Medicine

## 2017-11-18 DIAGNOSIS — J3089 Other allergic rhinitis: Secondary | ICD-10-CM | POA: Diagnosis not present

## 2017-11-18 DIAGNOSIS — J301 Allergic rhinitis due to pollen: Secondary | ICD-10-CM | POA: Diagnosis not present

## 2017-11-18 DIAGNOSIS — N135 Crossing vessel and stricture of ureter without hydronephrosis: Secondary | ICD-10-CM

## 2017-11-25 DIAGNOSIS — J3089 Other allergic rhinitis: Secondary | ICD-10-CM | POA: Diagnosis not present

## 2017-11-25 DIAGNOSIS — J301 Allergic rhinitis due to pollen: Secondary | ICD-10-CM | POA: Diagnosis not present

## 2017-12-02 DIAGNOSIS — J3089 Other allergic rhinitis: Secondary | ICD-10-CM | POA: Diagnosis not present

## 2017-12-02 DIAGNOSIS — J301 Allergic rhinitis due to pollen: Secondary | ICD-10-CM | POA: Diagnosis not present

## 2017-12-08 DIAGNOSIS — H6121 Impacted cerumen, right ear: Secondary | ICD-10-CM | POA: Diagnosis not present

## 2017-12-08 DIAGNOSIS — H93299 Other abnormal auditory perceptions, unspecified ear: Secondary | ICD-10-CM | POA: Diagnosis not present

## 2017-12-09 DIAGNOSIS — J3089 Other allergic rhinitis: Secondary | ICD-10-CM | POA: Diagnosis not present

## 2017-12-09 DIAGNOSIS — J301 Allergic rhinitis due to pollen: Secondary | ICD-10-CM | POA: Diagnosis not present

## 2017-12-11 ENCOUNTER — Ambulatory Visit (INDEPENDENT_AMBULATORY_CARE_PROVIDER_SITE_OTHER): Payer: PPO | Admitting: Urology

## 2017-12-11 ENCOUNTER — Encounter: Payer: Self-pay | Admitting: Urology

## 2017-12-11 VITALS — BP 136/68 | HR 68 | Ht 70.0 in | Wt 200.0 lb

## 2017-12-11 DIAGNOSIS — N135 Crossing vessel and stricture of ureter without hydronephrosis: Secondary | ICD-10-CM

## 2017-12-11 NOTE — Progress Notes (Signed)
12/11/2017 1:12 PM   Todd Golden [birth year] [id number]  Referring provider: Crecencio Mc, MD [place], [place] [address]  CC: Intermittent right flank pain  HPI: I saw [name] in urology clinic today in consultation for history of right UPJ obstruction from Dr. Derrel Golden.  He is an 84 year old very functional male with history of stroke on Eliquis with a long history of a right UPJ obstruction.  He was previously followed by Dr. Amalia Golden in [place], as well as a urologist in [place], [place].  He is reportedly been told that he has a chronic right UPJ obstruction which has been managed conservatively in the past.  At one point, approximately 20 years ago he did undergo stent placement for approximately 1 month, but this is since been removed.  His renal function is normal with creatinine of 1.01, EGFR greater than 60.  He denies any history of stone disease, UTIs, or gross hematuria.  He is minimally symptomatic from this.  He reports that occasionally 1-2 times per month he will note some right-sided groin or flank pain if he has a very large meal or drinks a large quantity of fluids.  He has never had any fevers, chills, or episodes of urosepsis.  He denies any urinary complaints.  There are no alleviating factors.  Duration is greater than 20 years.  Severity is mild.     PMH: Past Medical History:  Diagnosis Date  . Allergy   . Arthritis   . Asthma   . Cataract   . Chronic constipation   . Colon polyps   . Cough   . GERD (gastroesophageal reflux disease)   . Glaucoma   . HOH (hard of hearing)    a. Uses hearing aids  . Hyperlipidemia   . Hypertension   . Macular degeneration   . Nephrolithiasis    stent  . PAF (paroxysmal atrial fibrillation) ([place])    a. 11/2013 Dx in setting of L MCA stroke. CHA2DS2VASc = 5-->Eliquis; b. 11/2013 Echo: Nl LV fxn. Mildly dil LA.  . Stroke ([place])    a. 11/2013 L MCA stroke.  Marland Kitchen Ureteral stricture      Surgical History: Past Surgical History:  Procedure Laterality Date  . CATARACT EXTRACTION W/PHACO Left 12/20/2014   Procedure: CATARACT EXTRACTION PHACO AND INTRAOCULAR LENS PLACEMENT (IOC);  Surgeon: Birder Robson, MD;  Location: [facility] ORS;  Service: Ophthalmology;  Laterality: Left;   [place]     00:59.6 AP%    21.2 CDE    12.66 fluid pack lot #[id number]  . JOINT REPLACEMENT Left 2014   knee partial replacement  . kidney stent Right 1995    Home Medications:  Allergies as of 12/11/2017   No Known Allergies     Medication List        Accurate as of 12/11/17  1:12 PM. Always use your most recent med list.          amLODipine 5 MG tablet Commonly known as:  NORVASC TAKE 1 TABLET BY MOUTH ONCE DAILY   doxazosin 4 MG tablet Commonly known as:  CARDURA TAKE 1 TABLET BY MOUTH DAILY   ELIQUIS 5 MG Tabs tablet Generic drug:  apixaban TAKE 1 TABLET(5 MG) BY MOUTH TWICE DAILY   EPIPEN 2-PAK 0.3 mg/0.3 mL Soaj injection Generic drug:  EPINEPHrine Inject 2 mLs into the muscle as needed.   hydrochlorothiazide 25 MG tablet Commonly known as:  HYDRODIURIL TAKE 1 TABLET BY MOUTH ONCE DAILY  latanoprost 0.005 % ophthalmic solution Commonly known as:  XALATAN Place 1 drop into both eyes at bedtime.   omeprazole 40 MG capsule Commonly known as:  PRILOSEC Take 1 capsule (40 mg total) by mouth daily.   pindolol 5 MG tablet Commonly known as:  VISKEN TAKE 1 TABLET BY MOUTH EVERY DAY   PRESERVISION AREDS 2 PO Take by mouth daily.   simvastatin 20 MG tablet Commonly known as:  ZOCOR TAKE 1 TABLET BY MOUTH ONCE DAILY(NEED APPOINTMENT FOR MORE REFILLS)       Allergies: No Known Allergies  Family History: Family History  Problem Relation Age of Onset  . Arthritis Mother   . Cancer Mother 35       kidney  . Stroke Father     Social History:  reports that he has never smoked. He has never used smokeless tobacco. He reports that he drinks alcohol. He reports that  he does not use drugs.  ROS: Please see flowsheet from today's date for complete review of systems.  Physical Exam: BP 136/68   Pulse 68   Ht '5\' 10"'$  (1.778 m)   Wt 200 lb (90.7 kg)   BMI 28.70 kg/m    Constitutional:  Alert and oriented, No acute distress. Cardiovascular: No clubbing, cyanosis, or edema. Respiratory: Normal respiratory effort, no increased work of breathing. GI: Abdomen is soft, nontender, nondistended, no abdominal masses GU: No CVA tenderness Lymph: No cervical or inguinal lymphadenopathy. Skin: No rashes, bruises or suspicious lesions. Neurologic: Grossly intact, no focal deficits, moving all 4 extremities. Psychiatric: Normal mood and affect.  Laboratory Data: Reviewed Creatinine 1.0, EGFR greater than 60  Pertinent Imaging: No imaging is available to personally review, however there is [place] renal imaging study from 2003 with 50:50 split function and reportedly decreased drainage on the right, however this is stable from prior.  Assessment & Plan:   In summary, [name] is an 84 year old male with history of stroke on Eliquis with chronic right UPJ obstruction and normal renal function.  He is minimally symptomatic with 1-2 episodes per month of mild right-sided flank or groin pain consistent with a Dietl's crisis.  We discussed options moving forward including observation versus further investigation with imaging including possible ultrasound, CT scan, or nuclear medicine renal scan.  As he is minimally bothered by his symptoms, and they have been stable over the last 20 years, we both agreed to not pursue further work-up as it would not change management.  I did counsel him extensively that if he developed fever and right-sided flank pain he should seek care immediately for possible infection with obstruction, however this is unlikely as he has done well for over 20 years without any issues.  Follow-up as needed, or if worsening right-sided pain  Billey Co, MD  [place] [address], [address] [place], [place] [address] [phone]

## 2017-12-13 ENCOUNTER — Other Ambulatory Visit: Payer: Self-pay | Admitting: Internal Medicine

## 2017-12-16 ENCOUNTER — Other Ambulatory Visit: Payer: Self-pay | Admitting: Internal Medicine

## 2017-12-16 DIAGNOSIS — J3089 Other allergic rhinitis: Secondary | ICD-10-CM | POA: Diagnosis not present

## 2017-12-16 DIAGNOSIS — J301 Allergic rhinitis due to pollen: Secondary | ICD-10-CM | POA: Diagnosis not present

## 2017-12-23 DIAGNOSIS — J3089 Other allergic rhinitis: Secondary | ICD-10-CM | POA: Diagnosis not present

## 2017-12-23 DIAGNOSIS — J301 Allergic rhinitis due to pollen: Secondary | ICD-10-CM | POA: Diagnosis not present

## 2017-12-30 DIAGNOSIS — J301 Allergic rhinitis due to pollen: Secondary | ICD-10-CM | POA: Diagnosis not present

## 2017-12-30 DIAGNOSIS — J3089 Other allergic rhinitis: Secondary | ICD-10-CM | POA: Diagnosis not present

## 2018-01-06 DIAGNOSIS — J3089 Other allergic rhinitis: Secondary | ICD-10-CM | POA: Diagnosis not present

## 2018-01-06 DIAGNOSIS — J301 Allergic rhinitis due to pollen: Secondary | ICD-10-CM | POA: Diagnosis not present

## 2018-01-08 DIAGNOSIS — J301 Allergic rhinitis due to pollen: Secondary | ICD-10-CM | POA: Diagnosis not present

## 2018-01-08 DIAGNOSIS — J3089 Other allergic rhinitis: Secondary | ICD-10-CM | POA: Diagnosis not present

## 2018-01-13 DIAGNOSIS — J3089 Other allergic rhinitis: Secondary | ICD-10-CM | POA: Diagnosis not present

## 2018-01-13 DIAGNOSIS — J301 Allergic rhinitis due to pollen: Secondary | ICD-10-CM | POA: Diagnosis not present

## 2018-01-20 DIAGNOSIS — J3089 Other allergic rhinitis: Secondary | ICD-10-CM | POA: Diagnosis not present

## 2018-01-20 DIAGNOSIS — J301 Allergic rhinitis due to pollen: Secondary | ICD-10-CM | POA: Diagnosis not present

## 2018-01-22 DIAGNOSIS — J3089 Other allergic rhinitis: Secondary | ICD-10-CM | POA: Diagnosis not present

## 2018-01-22 DIAGNOSIS — J301 Allergic rhinitis due to pollen: Secondary | ICD-10-CM | POA: Diagnosis not present

## 2018-01-25 ENCOUNTER — Other Ambulatory Visit: Payer: Self-pay | Admitting: Internal Medicine

## 2018-01-27 DIAGNOSIS — J3089 Other allergic rhinitis: Secondary | ICD-10-CM | POA: Diagnosis not present

## 2018-01-27 DIAGNOSIS — J301 Allergic rhinitis due to pollen: Secondary | ICD-10-CM | POA: Diagnosis not present

## 2018-01-28 ENCOUNTER — Telehealth: Payer: Self-pay | Admitting: *Deleted

## 2018-01-28 NOTE — Telephone Encounter (Signed)
Copied from [place] [id number]. Topic: Appointment Scheduling - Scheduling Inquiry for Clinic >> Jan 28, 2018 10:49 AM Bea Graff, NT wrote: Reason for CRM: Pt wants to make sure he is ok to come in for his AWV this month and then not see Dr. Derrel Nip until July as scheduled or if one of these appts should be changed. Please advise.

## 2018-01-28 NOTE — Telephone Encounter (Signed)
Called and offered patient sameday with dr. Derrel Nip patient refused.

## 2018-01-29 DIAGNOSIS — J301 Allergic rhinitis due to pollen: Secondary | ICD-10-CM | POA: Diagnosis not present

## 2018-01-29 DIAGNOSIS — J3089 Other allergic rhinitis: Secondary | ICD-10-CM | POA: Diagnosis not present

## 2018-02-03 DIAGNOSIS — J301 Allergic rhinitis due to pollen: Secondary | ICD-10-CM | POA: Diagnosis not present

## 2018-02-03 DIAGNOSIS — J3089 Other allergic rhinitis: Secondary | ICD-10-CM | POA: Diagnosis not present

## 2018-02-04 DIAGNOSIS — H353211 Exudative age-related macular degeneration, right eye, with active choroidal neovascularization: Secondary | ICD-10-CM | POA: Diagnosis not present

## 2018-02-10 DIAGNOSIS — J3089 Other allergic rhinitis: Secondary | ICD-10-CM | POA: Diagnosis not present

## 2018-02-10 DIAGNOSIS — J301 Allergic rhinitis due to pollen: Secondary | ICD-10-CM | POA: Diagnosis not present

## 2018-02-12 ENCOUNTER — Ambulatory Visit (INDEPENDENT_AMBULATORY_CARE_PROVIDER_SITE_OTHER): Payer: PPO

## 2018-02-12 VITALS — BP 122/66 | HR 64 | Temp 98.0°F | Resp 16 | Ht 69.0 in | Wt 203.8 lb

## 2018-02-12 DIAGNOSIS — Z0000 Encounter for general adult medical examination without abnormal findings: Secondary | ICD-10-CM

## 2018-02-12 NOTE — Progress Notes (Signed)
Subjective:   Todd Golden is a 85 y.o. male who presents for Medicare Annual/Subsequent preventive examination.  Review of Systems:  No ROS.  Medicare Wellness Visit. Additional risk factors are reflected in the social history. Cardiac Risk Factors include: male gender;hypertension;advanced age (>46men, >82 women)     Objective:    Vitals: BP 122/66 (BP Location: Left Arm, Patient Position: Sitting, Cuff Size: Normal)   Pulse 64   Temp 98 F (36.7 C) (Oral)   Resp 16   Ht 5\' 9"  (1.753 m)   Wt 203 lb 12.8 oz (92.4 kg)   SpO2 95%   BMI 30.10 kg/m   Body mass index is 30.1 kg/m.  Advanced Directives 02/12/2018 02/11/2017 02/12/2016 02/10/2015 12/20/2014 01/24/2014  Does Patient Have a Medical Advance Directive? Yes Yes Yes Yes Yes Yes  Type of Paramedic of [place];Living will [place];Living will;Out of facility DNR (pink MOST or yellow form) [place];Living will [place];Living will - [place];Living will  Does patient want to make changes to medical advance directive? No - Patient declined No - Patient declined No - Patient declined No - Patient declined - -  Copy of [place] in Chart? Yes - validated most recent copy scanned in chart (See row information) Yes Yes No - copy requested - -    Tobacco Social History   Tobacco Use  Smoking Status Never Smoker  Smokeless Tobacco Never Used     Counseling given: Not Answered   Clinical Intake:  Pre-visit preparation completed: Yes        Diabetes: No  How often do you need to have someone help you when you read instructions, pamphlets, or other written materials from your doctor or pharmacy?: 1 - Never  Interpreter Needed?: No     Past Medical History:  Diagnosis Date  . Allergy   . Arthritis   . Asthma   . Cataract   . Chronic constipation   . Colon polyps   . Cough   . GERD  (gastroesophageal reflux disease)   . Glaucoma   . HOH (hard of hearing)    a. Uses hearing aids  . Hyperlipidemia   . Hypertension   . Macular degeneration   . Nephrolithiasis    stent  . PAF (paroxysmal atrial fibrillation) ([place])    a. 11/2013 Dx in setting of L MCA stroke. CHA2DS2VASc = 5-->Eliquis; b. 11/2013 Echo: Nl LV fxn. Mildly dil LA.  . Stroke ([place])    a. 11/2013 L MCA stroke.  Marland Kitchen Ureteral stricture    Past Surgical History:  Procedure Laterality Date  . CATARACT EXTRACTION W/PHACO Left 12/20/2014   Procedure: CATARACT EXTRACTION PHACO AND INTRAOCULAR LENS PLACEMENT (IOC);  Surgeon: Birder Robson, MD;  Location: [facility] ORS;  Service: Ophthalmology;  Laterality: Left;   [place]     00:59.6 AP%    21.2 CDE    12.66 fluid pack lot #[id number]  . JOINT REPLACEMENT Left 2014   knee partial replacement  . kidney stent Right 1995   Family History  Problem Relation Age of Onset  . Arthritis Mother   . Cancer Mother 35       kidney  . Stroke Father    Social History   Socioeconomic History  . Marital status: Married    Spouse name: Not on file  . Number of children: Not on file  . Years of education: Not on file  .  Highest education level: Not on file  Occupational History  . Not on file  Social Needs  . Financial resource strain: Not hard at all  . Food insecurity:    Worry: Never true    Inability: Never true  . Transportation needs:    Medical: No    Non-medical: No  Tobacco Use  . Smoking status: Never Smoker  . Smokeless tobacco: Never Used  Substance and Sexual Activity  . Alcohol use: Yes    Comment: rarely  . Drug use: No  . Sexual activity: Not Currently  Lifestyle  . Physical activity:    Days per week: 4 days    Minutes per session: 50 min  . Stress: Not at all  Relationships  . Social connections:    Talks on phone: Not on file    Gets together: Not on file    Attends religious service: Not on file    Active member of club or organization:  Not on file    Attends meetings of clubs or organizations: Not on file    Relationship status: Not on file  Other Topics Concern  . Not on file  Social History Narrative  . Not on file    Outpatient Encounter Medications as of 02/12/2018  Medication Sig  . amLODipine (NORVASC) 5 MG tablet TAKE 1 TABLET BY MOUTH ONCE DAILY  . doxazosin (CARDURA) 4 MG tablet TAKE 1 TABLET BY MOUTH DAILY  . ELIQUIS 5 MG TABS tablet TAKE 1 TABLET(5 MG) BY MOUTH TWICE DAILY  . hydrochlorothiazide (HYDRODIURIL) 25 MG tablet TAKE 1 TABLET BY MOUTH ONCE DAILY  . latanoprost (XALATAN) 0.005 % ophthalmic solution Place 1 drop into both eyes at bedtime.  . Multiple Vitamins-Minerals (PRESERVISION AREDS 2 PO) Take by mouth daily.  Marland Kitchen omeprazole (PRILOSEC) 40 MG capsule Take 1 capsule (40 mg total) by mouth daily.  . pindolol (VISKEN) 5 MG tablet TAKE 1 TABLET BY MOUTH EVERY DAY  . simvastatin (ZOCOR) 20 MG tablet TAKE 1 TABLET BY MOUTH ONCE DAILY(NEED APPOINTMENT FOR MORE REFILLS)  . [DISCONTINUED] EPIPEN 2-PAK 0.3 MG/0.3ML SOAJ injection Inject 2 mLs into the muscle as needed.  Marland Kitchen EPINEPHrine 0.3 mg/0.3 mL IJ SOAJ injection Inject 0.3 mg into the muscle as needed for anaphylaxis.   No facility-administered encounter medications on file as of 02/12/2018.     Activities of Daily Living In your present state of health, do you have any difficulty performing the following activities: 02/12/2018  Hearing? Y  Comment Hearing aids  Vision? N  Difficulty concentrating or making decisions? N  Walking or climbing stairs? N  Dressing or bathing? N  Doing errands, shopping? N  Preparing Food and eating ? N  Using the Toilet? N  In the past six months, have you accidently leaked urine? N  Do you have problems with loss of bowel control? N  Managing your Medications? N  Managing your Finances? Y  Comment Wife manages the finances  Housekeeping or managing your Housekeeping? N  Some recent data might be hidden     Patient Care Team: Crecencio Mc, MD as PCP - General (Internal Medicine) Wellington Hampshire, MD as PCP - Cardiology (Cardiology)   Assessment:   This is a routine wellness examination for Todd Golden.  Health Screenings  Colonoscopy -01/21/11 Glaucoma -yes Hearing -hearing aid Cholesterol -07/29/17 (135) TSH- 07/27/14 (1.55) Dental-every 6 months Vision- every 12 weeks  Social  Alcohol intake -yes, 3 to 5 per week Smoking history- never Smokers in  home? none Illicit drug use? none Exercise -treadmill, walking Diet -regular diet Sexually Active -never  Safety  Patient feels safe at home.  Patient does have smoke detectors at home  Patient does wear sunscreen or protective clothing when in direct sunlight. Patient does wear seat belt when driving or riding with others.   Activities of Daily Living Patient can do their own household chores. Denies needing assistance with: driving, feeding themselves, getting from bed to chair, getting to the toilet, bathing/showering, dressing, climbing flight of stairs, or preparing meals. Wife manages the money.   Depression Screen Patient denies losing interest in daily life, feeling hopeless, or crying easily over simple problems.   Fall Screen Patient denies being afraid of falling or falling in the last year.   Memory Screen Patient denies problems with memory, misplacing items. Wife balances the checkbook/bank accounts.  Patient is alert, normal appearance, oriented to person/place/and time. Correctly identified the president of the [place], recall of 2/3 objects. Patient displays appropriate judgement and can read correct time from watch face.   Immunizations The following Immunizations are up to date: Influenza, shingles, pneumonia, and tetanus.   Other Providers Patient Care Team: Crecencio Mc, MD as PCP - General (Internal Medicine) Wellington Hampshire, MD as PCP - Cardiology (Cardiology)  Exercise Activities and Dietary  recommendations Current Exercise Habits: Home exercise routine, Type of exercise: treadmill;walking, Time (Minutes): 45, Frequency (Times/Week): 4, Weekly Exercise (Minutes/Week): 180, Intensity: Mild  Goals      Patient Stated   . DIET - REDUCE PORTION SIZE (pt-stated)     Healthy diet    . Weight (lb) < 200 lb (90.7 kg) (pt-stated)     Weight 185lb       Fall Risk Fall Risk  02/12/2018 02/11/2017 02/12/2016 02/10/2015 02/08/2015  Falls in the past year? 0 Yes No No No  Depression Screen PHQ 2/9 Scores 02/12/2018 02/11/2017 02/12/2016 02/10/2015  PHQ - 2 Score 0 0 0 0  PHQ- 9 Score - 0 - -    Cognitive Function MMSE - Mini Mental State Exam 02/12/2016 02/10/2015  Orientation to time 5 5  Orientation to Place 5 5  Registration 3 3  Attention/ Calculation 5 5  Recall 3 3  Language- name 2 objects 2 2  Language- repeat 1 1  Language- follow 3 step command 3 3  Language- read & follow direction 1 1  Write a sentence 1 1  Copy design 1 1  Total score 30 30     6CIT Screen 02/12/2018 02/11/2017  What Year? 0 points 0 points  What month? 0 points 0 points  What time? 0 points 0 points  Count back from 20 0 points 0 points  Months in reverse 0 points 0 points  Repeat phrase 0 points 0 points  Total Score 0 0    Immunization History  Administered Date(s) Administered  . Influenza,inj,Quad PF,6+ Mos 10/21/2012  . Influenza-Unspecified 11/09/2013, 11/18/2014, 11/10/2015, 11/20/2016, 11/19/2017  . Pneumococcal Conjugate-13 12/10/2013  . Pneumococcal Polysaccharide-23 10/21/2009, 02/08/2015  . Tdap 06/12/2013  . Zoster 09/27/2015   Screening Tests Health Maintenance  Topic Date Due  . TETANUS/TDAP  06/13/2023  . INFLUENZA VACCINE  Completed  . PNA vac Low Risk Adult  Completed       Plan:    End of life planning; Advance aging; Advanced directives discussed. Copy of current HCPOA/Living Will on file.    Return in July for follow up with pcp or sooner if needed.  I  have personally reviewed and noted the following in the patient's chart:   . Medical and social history . Use of alcohol, tobacco or illicit drugs  . Current medications and supplements . Functional ability and status . Nutritional status . Physical activity . Advanced directives . List of other physicians . Hospitalizations, surgeries, and ER visits in previous 12 months . Vitals . Screenings to include cognitive, depression, and falls . Referrals and appointments  In addition, I have reviewed and discussed with patient certain preventive protocols, quality metrics, and best practice recommendations. A written personalized care plan for preventive services as well as general preventive health recommendations were provided to patient.     Varney Biles, LPN  6/42/9037

## 2018-02-12 NOTE — Patient Instructions (Addendum)
  [name] , Thank you for taking time to come for your Medicare Wellness Visit. I appreciate your ongoing commitment to your health goals. Please review the following plan we discussed and let me know if I can assist you in the future.   Follow up as needed.    Have a great day and stay warm!  These are the goals we discussed: Goals      Patient Stated   . DIET - REDUCE PORTION SIZE (pt-stated)     Healthy diet    . Weight (lb) < 200 lb (90.7 kg) (pt-stated)     Weight 185lb       This is a list of the screening recommended for you and due dates:  Health Maintenance  Topic Date Due  . Tetanus Vaccine  06/13/2023  . Flu Shot  Completed  . Pneumonia vaccines  Completed

## 2018-02-12 NOTE — Progress Notes (Signed)
Note reviewed. No acute issues. Follow up with PCP

## 2018-02-17 DIAGNOSIS — J301 Allergic rhinitis due to pollen: Secondary | ICD-10-CM | POA: Diagnosis not present

## 2018-02-17 DIAGNOSIS — J3089 Other allergic rhinitis: Secondary | ICD-10-CM | POA: Diagnosis not present

## 2018-02-24 DIAGNOSIS — J3089 Other allergic rhinitis: Secondary | ICD-10-CM | POA: Diagnosis not present

## 2018-02-24 DIAGNOSIS — J301 Allergic rhinitis due to pollen: Secondary | ICD-10-CM | POA: Diagnosis not present

## 2018-03-03 DIAGNOSIS — J3089 Other allergic rhinitis: Secondary | ICD-10-CM | POA: Diagnosis not present

## 2018-03-03 DIAGNOSIS — J301 Allergic rhinitis due to pollen: Secondary | ICD-10-CM | POA: Diagnosis not present

## 2018-03-10 DIAGNOSIS — J3089 Other allergic rhinitis: Secondary | ICD-10-CM | POA: Diagnosis not present

## 2018-03-10 DIAGNOSIS — J301 Allergic rhinitis due to pollen: Secondary | ICD-10-CM | POA: Diagnosis not present

## 2018-03-13 ENCOUNTER — Other Ambulatory Visit: Payer: Self-pay | Admitting: Internal Medicine

## 2018-03-17 DIAGNOSIS — J3089 Other allergic rhinitis: Secondary | ICD-10-CM | POA: Diagnosis not present

## 2018-03-17 DIAGNOSIS — J301 Allergic rhinitis due to pollen: Secondary | ICD-10-CM | POA: Diagnosis not present

## 2018-03-24 DIAGNOSIS — J301 Allergic rhinitis due to pollen: Secondary | ICD-10-CM | POA: Diagnosis not present

## 2018-03-24 DIAGNOSIS — J3089 Other allergic rhinitis: Secondary | ICD-10-CM | POA: Diagnosis not present

## 2018-03-31 DIAGNOSIS — J3089 Other allergic rhinitis: Secondary | ICD-10-CM | POA: Diagnosis not present

## 2018-03-31 DIAGNOSIS — J301 Allergic rhinitis due to pollen: Secondary | ICD-10-CM | POA: Diagnosis not present

## 2018-04-07 DIAGNOSIS — J3089 Other allergic rhinitis: Secondary | ICD-10-CM | POA: Diagnosis not present

## 2018-04-07 DIAGNOSIS — J301 Allergic rhinitis due to pollen: Secondary | ICD-10-CM | POA: Diagnosis not present

## 2018-04-12 ENCOUNTER — Other Ambulatory Visit: Payer: Self-pay | Admitting: Cardiovascular Disease

## 2018-04-13 NOTE — Telephone Encounter (Signed)
Refill Request.  

## 2018-04-14 ENCOUNTER — Other Ambulatory Visit: Payer: Self-pay | Admitting: Internal Medicine

## 2018-04-14 DIAGNOSIS — J301 Allergic rhinitis due to pollen: Secondary | ICD-10-CM | POA: Diagnosis not present

## 2018-04-14 DIAGNOSIS — J3089 Other allergic rhinitis: Secondary | ICD-10-CM | POA: Diagnosis not present

## 2018-04-16 ENCOUNTER — Other Ambulatory Visit: Payer: Self-pay | Admitting: Internal Medicine

## 2018-04-21 DIAGNOSIS — J301 Allergic rhinitis due to pollen: Secondary | ICD-10-CM | POA: Diagnosis not present

## 2018-04-21 DIAGNOSIS — J3089 Other allergic rhinitis: Secondary | ICD-10-CM | POA: Diagnosis not present

## 2018-04-28 DIAGNOSIS — J3089 Other allergic rhinitis: Secondary | ICD-10-CM | POA: Diagnosis not present

## 2018-04-28 DIAGNOSIS — J301 Allergic rhinitis due to pollen: Secondary | ICD-10-CM | POA: Diagnosis not present

## 2018-04-29 DIAGNOSIS — K649 Unspecified hemorrhoids: Secondary | ICD-10-CM | POA: Diagnosis not present

## 2018-04-29 DIAGNOSIS — Z1211 Encounter for screening for malignant neoplasm of colon: Secondary | ICD-10-CM | POA: Diagnosis not present

## 2018-04-29 DIAGNOSIS — H353211 Exudative age-related macular degeneration, right eye, with active choroidal neovascularization: Secondary | ICD-10-CM | POA: Diagnosis not present

## 2018-04-29 DIAGNOSIS — Z86010 Personal history of colonic polyps: Secondary | ICD-10-CM | POA: Diagnosis not present

## 2018-05-05 DIAGNOSIS — J301 Allergic rhinitis due to pollen: Secondary | ICD-10-CM | POA: Diagnosis not present

## 2018-05-05 DIAGNOSIS — J3089 Other allergic rhinitis: Secondary | ICD-10-CM | POA: Diagnosis not present

## 2018-05-12 DIAGNOSIS — J3089 Other allergic rhinitis: Secondary | ICD-10-CM | POA: Diagnosis not present

## 2018-05-12 DIAGNOSIS — J301 Allergic rhinitis due to pollen: Secondary | ICD-10-CM | POA: Diagnosis not present

## 2018-05-19 DIAGNOSIS — J3089 Other allergic rhinitis: Secondary | ICD-10-CM | POA: Diagnosis not present

## 2018-05-19 DIAGNOSIS — J301 Allergic rhinitis due to pollen: Secondary | ICD-10-CM | POA: Diagnosis not present

## 2018-05-26 DIAGNOSIS — J3089 Other allergic rhinitis: Secondary | ICD-10-CM | POA: Diagnosis not present

## 2018-05-26 DIAGNOSIS — J301 Allergic rhinitis due to pollen: Secondary | ICD-10-CM | POA: Diagnosis not present

## 2018-05-28 DIAGNOSIS — J301 Allergic rhinitis due to pollen: Secondary | ICD-10-CM | POA: Diagnosis not present

## 2018-05-28 DIAGNOSIS — J3089 Other allergic rhinitis: Secondary | ICD-10-CM | POA: Diagnosis not present

## 2018-06-02 DIAGNOSIS — J301 Allergic rhinitis due to pollen: Secondary | ICD-10-CM | POA: Diagnosis not present

## 2018-06-02 DIAGNOSIS — J3089 Other allergic rhinitis: Secondary | ICD-10-CM | POA: Diagnosis not present

## 2018-06-09 DIAGNOSIS — J3089 Other allergic rhinitis: Secondary | ICD-10-CM | POA: Diagnosis not present

## 2018-06-09 DIAGNOSIS — J301 Allergic rhinitis due to pollen: Secondary | ICD-10-CM | POA: Diagnosis not present

## 2018-06-14 ENCOUNTER — Other Ambulatory Visit: Payer: Self-pay | Admitting: Internal Medicine

## 2018-06-16 DIAGNOSIS — J301 Allergic rhinitis due to pollen: Secondary | ICD-10-CM | POA: Diagnosis not present

## 2018-06-16 DIAGNOSIS — J3089 Other allergic rhinitis: Secondary | ICD-10-CM | POA: Diagnosis not present

## 2018-06-17 ENCOUNTER — Other Ambulatory Visit: Payer: Self-pay

## 2018-06-17 MED ORDER — SIMVASTATIN 20 MG PO TABS: ORAL_TABLET | ORAL | 1 refills | Status: DC

## 2018-06-17 NOTE — Telephone Encounter (Signed)
*  STAT* If patient is at the pharmacy, call can be transferred to refill team.   1. Which medications need to be refilled? (please list name of each medication and dose if known) Simvastatin  2. Which pharmacy/location (including street and city if local pharmacy) is medication to be sent to? [place]  3. Do they need a 30 day or 90 day supply? [place]

## 2018-06-18 DIAGNOSIS — J301 Allergic rhinitis due to pollen: Secondary | ICD-10-CM | POA: Diagnosis not present

## 2018-06-18 DIAGNOSIS — J3089 Other allergic rhinitis: Secondary | ICD-10-CM | POA: Diagnosis not present

## 2018-06-23 DIAGNOSIS — J301 Allergic rhinitis due to pollen: Secondary | ICD-10-CM | POA: Diagnosis not present

## 2018-06-23 DIAGNOSIS — J3089 Other allergic rhinitis: Secondary | ICD-10-CM | POA: Diagnosis not present

## 2018-06-26 DIAGNOSIS — J3089 Other allergic rhinitis: Secondary | ICD-10-CM | POA: Diagnosis not present

## 2018-06-26 DIAGNOSIS — J301 Allergic rhinitis due to pollen: Secondary | ICD-10-CM | POA: Diagnosis not present

## 2018-06-30 DIAGNOSIS — J301 Allergic rhinitis due to pollen: Secondary | ICD-10-CM | POA: Diagnosis not present

## 2018-06-30 DIAGNOSIS — J3089 Other allergic rhinitis: Secondary | ICD-10-CM | POA: Diagnosis not present

## 2018-07-07 DIAGNOSIS — J3089 Other allergic rhinitis: Secondary | ICD-10-CM | POA: Diagnosis not present

## 2018-07-07 DIAGNOSIS — J301 Allergic rhinitis due to pollen: Secondary | ICD-10-CM | POA: Diagnosis not present

## 2018-07-11 ENCOUNTER — Other Ambulatory Visit: Payer: Self-pay | Admitting: Cardiovascular Disease

## 2018-07-13 ENCOUNTER — Other Ambulatory Visit: Payer: Self-pay | Admitting: Cardiovascular Disease

## 2018-07-14 DIAGNOSIS — J301 Allergic rhinitis due to pollen: Secondary | ICD-10-CM | POA: Diagnosis not present

## 2018-07-14 DIAGNOSIS — J3089 Other allergic rhinitis: Secondary | ICD-10-CM | POA: Diagnosis not present

## 2018-07-21 DIAGNOSIS — J3089 Other allergic rhinitis: Secondary | ICD-10-CM | POA: Diagnosis not present

## 2018-07-21 DIAGNOSIS — J301 Allergic rhinitis due to pollen: Secondary | ICD-10-CM | POA: Diagnosis not present

## 2018-07-22 DIAGNOSIS — H353211 Exudative age-related macular degeneration, right eye, with active choroidal neovascularization: Secondary | ICD-10-CM | POA: Diagnosis not present

## 2018-07-28 ENCOUNTER — Other Ambulatory Visit: Payer: Self-pay

## 2018-07-28 DIAGNOSIS — J3089 Other allergic rhinitis: Secondary | ICD-10-CM | POA: Diagnosis not present

## 2018-07-28 DIAGNOSIS — J301 Allergic rhinitis due to pollen: Secondary | ICD-10-CM | POA: Diagnosis not present

## 2018-07-28 MED ORDER — HYDROCHLOROTHIAZIDE 25 MG PO TABS: 25.0000 mg | ORAL_TABLET | Freq: Every day | ORAL | 1 refills | Status: DC

## 2018-08-06 DIAGNOSIS — J3081 Allergic rhinitis due to animal (cat) (dog) hair and dander: Secondary | ICD-10-CM | POA: Diagnosis not present

## 2018-08-06 DIAGNOSIS — J3089 Other allergic rhinitis: Secondary | ICD-10-CM | POA: Diagnosis not present

## 2018-08-06 DIAGNOSIS — J301 Allergic rhinitis due to pollen: Secondary | ICD-10-CM | POA: Diagnosis not present

## 2018-08-11 DIAGNOSIS — J301 Allergic rhinitis due to pollen: Secondary | ICD-10-CM | POA: Diagnosis not present

## 2018-08-11 DIAGNOSIS — J3089 Other allergic rhinitis: Secondary | ICD-10-CM | POA: Diagnosis not present

## 2018-08-14 ENCOUNTER — Ambulatory Visit: Payer: PPO | Admitting: Internal Medicine

## 2018-08-17 ENCOUNTER — Other Ambulatory Visit: Payer: Self-pay

## 2018-08-18 DIAGNOSIS — J301 Allergic rhinitis due to pollen: Secondary | ICD-10-CM | POA: Diagnosis not present

## 2018-08-18 DIAGNOSIS — J3089 Other allergic rhinitis: Secondary | ICD-10-CM | POA: Diagnosis not present

## 2018-08-19 ENCOUNTER — Encounter: Payer: Self-pay | Admitting: Internal Medicine

## 2018-08-19 ENCOUNTER — Ambulatory Visit (INDEPENDENT_AMBULATORY_CARE_PROVIDER_SITE_OTHER): Payer: PPO | Admitting: Internal Medicine

## 2018-08-19 ENCOUNTER — Other Ambulatory Visit: Payer: Self-pay

## 2018-08-19 VITALS — BP 120/68 | HR 62 | Temp 97.6°F | Resp 16 | Ht 69.0 in | Wt 204.1 lb

## 2018-08-19 DIAGNOSIS — R4701 Aphasia: Secondary | ICD-10-CM | POA: Diagnosis not present

## 2018-08-19 DIAGNOSIS — N401 Enlarged prostate with lower urinary tract symptoms: Secondary | ICD-10-CM

## 2018-08-19 DIAGNOSIS — R350 Frequency of micturition: Secondary | ICD-10-CM | POA: Diagnosis not present

## 2018-08-19 DIAGNOSIS — I10 Essential (primary) hypertension: Secondary | ICD-10-CM | POA: Diagnosis not present

## 2018-08-19 DIAGNOSIS — R7303 Prediabetes: Secondary | ICD-10-CM

## 2018-08-19 DIAGNOSIS — R5383 Other fatigue: Secondary | ICD-10-CM

## 2018-08-19 DIAGNOSIS — Z974 Presence of external hearing-aid: Secondary | ICD-10-CM

## 2018-08-19 DIAGNOSIS — I480 Paroxysmal atrial fibrillation: Secondary | ICD-10-CM

## 2018-08-19 DIAGNOSIS — E785 Hyperlipidemia, unspecified: Secondary | ICD-10-CM | POA: Diagnosis not present

## 2018-08-19 DIAGNOSIS — D649 Anemia, unspecified: Secondary | ICD-10-CM | POA: Diagnosis not present

## 2018-08-19 DIAGNOSIS — R200 Anesthesia of skin: Secondary | ICD-10-CM | POA: Diagnosis not present

## 2018-08-19 MED ORDER — OXYBUTYNIN CHLORIDE 5 MG PO TABS: 5.0000 mg | ORAL_TABLET | Freq: Every day | ORAL | 0 refills | Status: DC

## 2018-08-19 NOTE — Patient Instructions (Signed)
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 Falls are a major cause of broken bones and head injuries in people who are older than age 55. Getting regular preventive care can help to keep you healthy and well. Preventive care includes getting regular testing and making lifestyle changes as recommended by your health care provider. Talk with your health care provider about:  Which screenings and tests you should have. A screening is a test that checks for a disease when you have no symptoms.  A diet and exercise plan that is right for you. What should I know about screenings and tests to prevent falls? Screening and testing are the best ways to find a health problem early. Early diagnosis and treatment give you the best chance of managing medical conditions that are common after age 71. Certain conditions and lifestyle choices may make you more likely to have a fall. Your health care provider may recommend:  Regular vision checks. Poor vision and conditions such as cataracts can make you more likely to have a fall. If you wear glasses, make sure to get your prescription updated if your vision changes.  Medicine review. Work with your health care provider to regularly review all of the medicines you are taking, including over-the-counter medicines. Ask your health care provider about any side effects that may make you more likely to have a fall. Tell your health care provider  if any medicines that you take make you feel dizzy or sleepy.  Osteoporosis screening. Osteoporosis is a condition that causes the bones to get weaker. This can make the bones weak and cause them to break more easily.  Blood pressure screening. Blood pressure changes and medicines to control blood pressure can make you feel dizzy.  Strength and balance checks. Your health care provider may recommend certain tests to check your strength and balance while standing, walking, or changing positions.  Foot health exam. Foot pain and numbness, as well as not wearing proper footwear, can make you more likely to have a fall.  Depression screening. You may be more likely to have a fall if you have a fear of falling, feel emotionally low, or feel unable to do activities that you used to do.  Alcohol use screening. Using too much alcohol can affect your balance and may make you more likely to have a fall. What actions can I take to lower my risk of falls? General instructions  Talk with your health care provider about your risks for falling. Tell your health care provider if: ? You fall. Be sure to tell your health care provider about all falls, even ones that seem minor. ? You feel dizzy, sleepy, or off-balance.  Take over-the-counter and prescription medicines only as told by your health care provider. These include any supplements.  Eat a healthy diet and maintain a healthy weight. A healthy diet includes low-fat dairy products, low-fat (lean) meats,  and fiber from whole grains, beans, and lots of fruits and vegetables. Home safety  Remove any tripping hazards, such as rugs, cords, and clutter.  Install safety equipment such as grab bars in bathrooms and safety rails on stairs.  Keep rooms and walkways well-lit. Activity   Follow a regular exercise program to stay fit. This will help you maintain your balance. Ask your health care provider what types of exercise are appropriate for you.  If  you need a cane or walker, use it as recommended by your health care provider.  Wear supportive shoes that have nonskid soles. Lifestyle  Do not drink alcohol if your health care provider tells you not to drink.  If you drink alcohol, limit how much you have: ? 0-1 drink a day for women. ? 0-2 drinks a day for men.  Be aware of how much alcohol is in your drink. In the [place] one drink equals one typical bottle of beer (12 oz), one-half glass of wine (5 oz), or one shot of hard liquor (1 oz).  Do not use any products that contain nicotine or tobacco, such as cigarettes and e-cigarettes. If you need help quitting, ask your health care provider. Summary  Having a healthy lifestyle and getting preventive care can help to protect your health and wellness after age 55.  Screening and testing are the best way to find a health problem early and help you avoid having a fall. Early diagnosis and treatment give you the best chance for managing medical conditions that are more common for people who are older than age 65.  Falls are a major cause of broken bones and head injuries in people who are older than age 4. Take precautions to prevent a fall at home.  Work with your health care provider to learn what changes you can make to improve your health and wellness and to prevent falls. This information is not intended to replace advice given to you by your health care provider. Make sure you discuss any questions you have with your health care provider. Document Released: 11/20/2016 Document Revised: 04/30/2018 Document Reviewed: 11/20/2016 Elsevier Patient Education  2020 [employer].

## 2018-08-19 NOTE — Progress Notes (Signed)
Patient ID: Todd Golden, male    DOB: 31-Jan-1930  Age: 85 y.o. MRN: [id number]  The patient is here for follow up and management of  other chronic and acute problems.   The risk factors are reflected in the social history.  The roster of all physicians providing medical care to patient - is listed in the Snapshot section of the chart.  Activities of daily living:  The patient is 100% independent in all ADLs: dressing, toileting, feeding as well as independent mobility  Home safety : The patient has smoke detectors in the home. They wear seatbelts.  There are no firearms at home. There is no violence in the home.   There is no risks for hepatitis, STDs or HIV. There is no   history of blood transfusion. They have no travel history to infectious disease endemic areas of the world.  The patient has seen their dentist in the last six month. They have seen their eye doctor in the last year. They admit to slight hearing difficulty with regard to whispered voices and some television programs.  They have deferred audiologic testing in the last year.  They do not  have excessive sun exposure. Discussed the need for sun protection: hats, long sleeves and use of sunscreen if there is significant sun exposure.   Diet: the importance of a healthy diet is discussed. They do have a healthy diet.  The benefits of regular aerobic exercise were discussed. He walks 4 times per week.  He lives at [facility]  He gardens in the evening 2 days per week   Wants to return to the gym once it has been remodeled and enlarged. .   Takes an afternoon nap for 60 minutes max,  Goes to bed at 10 and sleeps til 6:00   Depression screen: there are no signs or vegative symptoms of depression- irritability, change in appetite, anhedonia, sadness/tearfullness.  Cognitive assessment: the patient manages all their financial and personal affairs and is actively engaged. They could relate day,date,year and events; recalled 2/3  objects at 3 minutes; performed clock-face test normally.  The following portions of the patient's history were reviewed and updated as appropriate: allergies, current medications, past family history, past medical history,  past surgical history, past social history  and problem list.  Visual acuity was not assessed per patient preference since she has regular follow up with her ophthalmologist. Hearing and body mass index were assessed and reviewed.   During the course of the visit the patient was educated and counseled about appropriate screening and preventive services including : fall prevention , diabetes screening, nutrition counseling, colorectal cancer screening, and recommended immunizations.    CC: The primary encounter diagnosis was Essential hypertension, benign. Diagnoses of Hyperlipidemia LDL goal <100, Hand numbness, Fatigue, unspecified type, Hearing aid worn, Broca's aphasia, Benign prostatic hyperplasia with urinary frequency, PAF (paroxysmal atrial fibrillation) ([place]), and Left arm numbness were also pertinent to this visit.  Toenail fungus. Wants treatment ,  unsightly  Patient is taking her medications as prescribed and notes no adverse effects.  Home BP readings have been done about once per week and are  generally < 130/80 .  She is avoiding added salt in her diet and walking regularly about 3 times per week for exercise  .  Wife has been treated a "Corn " on end of 3rd toe on the right.    Numbness left hand  Now involving the thumb  initially started in the  index  and middle finger, now spread.  He is dropping glasses.\, burning himself and cutting himself.     Starting to occur on the right as well.    Post CVA deficits:  Forgetting names   And missing /articulating certain words. Since stroke   History Harlon has a past medical history of Allergy, Arthritis, Asthma, Cataract, Chronic constipation, Colon polyps, Cough, GERD (gastroesophageal reflux disease), Glaucoma,  HOH (hard of hearing), Hyperlipidemia, Hypertension, Macular degeneration, Nephrolithiasis, PAF (paroxysmal atrial fibrillation) ([place]), Stroke ([place]), and Ureteral stricture.   He has a past surgical history that includes Joint replacement (Left, 2014); kidney stent (Right, 1995); and Cataract extraction w/PHACO (Left, 12/20/2014).   His family history includes Arthritis in his mother; Cancer (age of onset: 54) in his mother; Stroke in his father.He reports that he has never smoked. He has never used smokeless tobacco. He reports current alcohol use. He reports that he does not use drugs.  Outpatient Medications Prior to Visit  Medication Sig Dispense Refill  . amLODipine (NORVASC) 5 MG tablet TAKE 1 TABLET BY MOUTH ONCE DAILY 90 tablet 1  . doxazosin (CARDURA) 4 MG tablet TAKE 1 TABLET BY MOUTH DAILY 90 tablet 0  . ELIQUIS 5 MG TABS tablet TAKE 1 TABLET(5 MG) BY MOUTH TWICE DAILY 60 tablet 6  . EPINEPHrine 0.3 mg/0.3 mL IJ SOAJ injection Inject 0.3 mg into the muscle as needed for anaphylaxis.    . hydrochlorothiazide (HYDRODIURIL) 25 MG tablet Take 1 tablet (25 mg total) by mouth daily. 90 tablet 1  . latanoprost (XALATAN) 0.005 % ophthalmic solution Place 1 drop into both eyes at bedtime.    . Multiple Vitamins-Minerals (PRESERVISION AREDS 2 PO) Take by mouth daily.    Marland Kitchen omeprazole (PRILOSEC) 40 MG capsule TAKE 1 CAPSULE(40 MG) BY MOUTH DAILY 90 capsule 1  . pindolol (VISKEN) 5 MG tablet TAKE 1 TABLET BY MOUTH EVERY DAY 90 tablet 1  . simvastatin (ZOCOR) 20 MG tablet TAKE 1 TABLET BY MOUTH ONCE DAILY 90 tablet 1   No facility-administered medications prior to visit.     Review of Systems   Patient denies headache, fevers, malaise, unintentional weight loss, skin rash, eye pain, sinus congestion and sinus pain, sore throat, dysphagia,  hemoptysis , cough, dyspnea, wheezing, chest pain, palpitations, orthopnea, edema, abdominal pain, nausea, melena, diarrhea, constipation, flank pain,  dysuria, hematuria, urinary  Frequency, nocturia, numbness, tingling, seizures,  Focal weakness, Loss of consciousness,  Tremor, insomnia, depression, anxiety, and suicidal ideation.      Objective:  BP 120/68 (BP Location: Left Arm, Patient Position: Sitting, Cuff Size: Normal)   Pulse 62   Temp 97.6 F (36.4 C) (Oral)   Resp 16   Ht 5\' 9"  (1.753 m)   Wt 204 lb 1.9 oz (92.6 kg)   SpO2 98%   BMI 30.14 kg/m   Physical Exam   General appearance: alert, cooperative and appears stated age Ears: normal TM's and external ear canals both ears Throat: lips, mucosa, and tongue normal; teeth and gums normal Neck: no adenopathy, no carotid bruit, supple, symmetrical, trachea midline and thyroid not enlarged, symmetric, no tenderness/mass/nodules Back: symmetric, no curvature. ROM normal. No CVA tenderness. Lungs: clear to auscultation bilaterally Heart: regular rate and rhythm, S1, S2 normal, no murmur, click, rub or gallop Abdomen: soft, non-tender; bowel sounds normal; no masses,  no organomegaly Pulses: 2+ and symmetric Skin: Skin color, texture, turgor normal. No rashes or lesions EXT:  Claw toes involving 2nd and 3rd MT on right foot.  Thickened callous on distal end of third toe.  Lymph nodes: Cervical, supraclavicular, and axillary nodes normal.   Assessment & Plan:   Problem List Items Addressed This Visit      Unprioritized   PAF (paroxysmal atrial fibrillation) ([place])    Rate controlled with  pindolol, embolic stroke risk mitigated with Eliquis.  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  Needs neurology referral for EMG studies      Hyperlipidemia LDL goal <100    Patient tolerating zocor for secondary prevention due to recent nonhemorrhagic left MCA territory CVA.  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 Renal function stable, no changes today.  Lab Results  Component Value Date   CREATININE 1.15 08/19/2018   Lab Results  Component Value Date   NA 140 08/19/2018   K 3.7 08/19/2018   CL 102 08/19/2018   CO2 30 08/19/2018         Relevant Orders   Comprehensive metabolic panel (Completed)   Broca's aphasia    His articulation has improved dramatically  since his hemorrhagic stroke,  But has occasional problems with word finding,  He is articulate today      Benign prostatic hyperplasia with urinary frequency    flomax prescribed        Other Visit Diagnoses    Hand numbness       Relevant Orders   TSH (Completed)   Hemoglobin A1c (Completed)   Fatigue, unspecified type       Relevant Orders   CBC with Differential/Platelet (Completed)      I am having [name] start on oxybutynin. I am also having him maintain his latanoprost, Multiple Vitamins-Minerals (PRESERVISION AREDS 2 PO), EPINEPHrine, Eliquis, omeprazole, pindolol, amLODipine, simvastatin, doxazosin, and hydrochlorothiazide.  Meds ordered this encounter  Medications  . oxybutynin (DITROPAN) 5 MG tablet    Sig: Take 1 tablet (5 mg total) by mouth at bedtime.    Dispense:  90 tablet    Refill:  0   A total of 40 minutes was spent with patient more than half of which was spent in counseling patient on the above mentioned issues , reviewing and explaining recent labs and imaging studies done, and coordination of care.  There are no discontinued medications.  Follow-up: Return in about 6 months (around 02/19/2019).   Crecencio Mc, MD

## 2018-08-20 DIAGNOSIS — N401 Enlarged prostate with lower urinary tract symptoms: Secondary | ICD-10-CM | POA: Insufficient documentation

## 2018-08-20 LAB — COMPREHENSIVE METABOLIC PANEL
ALT: 20 U/L (ref 0–53)
AST: 21 U/L (ref 0–37)
Albumin: 4.2 g/dL (ref 3.5–5.2)
Alkaline Phosphatase: 53 U/L (ref 39–117)
BUN: 21 mg/dL (ref 6–23)
CO2: 30 mEq/L (ref 19–32)
Calcium: 9.2 mg/dL (ref 8.4–10.5)
Chloride: 102 mEq/L (ref 96–112)
Creatinine, Ser: 1.15 mg/dL (ref 0.40–1.50)
GFR: 60.28 mL/min (ref >60.00)
Glucose, Bld: 114 mg/dL — ABNORMAL HIGH (ref 70–99)
Potassium: 3.7 mEq/L (ref 3.5–5.1)
Sodium: 140 mEq/L (ref 135–145)
Total Bilirubin: 0.5 mg/dL (ref 0.2–1.2)
Total Protein: 7 g/dL (ref 6.0–8.3)

## 2018-08-20 LAB — HEMOGLOBIN A1C: Hgb A1c MFr Bld: 6.1 % (ref 4.6–6.5)

## 2018-08-20 LAB — CBC WITH DIFFERENTIAL/PLATELET
Basophils Absolute: 0.1 10*3/uL (ref 0.0–0.1)
Basophils Relative: 1.4 % (ref 0.0–3.0)
Eosinophils Absolute: 0.2 10*3/uL (ref 0.0–0.7)
Eosinophils Relative: 3.6 % (ref 0.0–5.0)
HCT: 38.4 % — ABNORMAL LOW (ref 39.0–52.0)
Hemoglobin: 12.8 g/dL — ABNORMAL LOW (ref 13.0–17.0)
Lymphocytes Relative: 33.6 % (ref 12.0–46.0)
Lymphs Abs: 2.3 10*3/uL (ref 0.7–4.0)
MCHC: 33.2 g/dL (ref 30.0–36.0)
MCV: 92.8 fl (ref 78.0–100.0)
Monocytes Absolute: 0.9 10*3/uL (ref 0.1–1.0)
Monocytes Relative: 12.9 % — ABNORMAL HIGH (ref 3.0–12.0)
Neutro Abs: 3.4 10*3/uL (ref 1.4–7.7)
Neutrophils Relative %: 48.5 % (ref 43.0–77.0)
Platelets: 217 10*3/uL (ref 150.0–400.0)
RBC: 4.14 Mil/uL — ABNORMAL LOW (ref 4.22–5.81)
RDW: 13.5 % (ref 11.5–15.5)
WBC: 6.9 10*3/uL (ref 4.0–10.5)

## 2018-08-20 LAB — TSH: TSH: 1.46 u[IU]/mL (ref 0.35–4.50)

## 2018-08-20 LAB — LIPID PANEL
Cholesterol: 134 mg/dL (ref 0–200)
HDL: 44.2 mg/dL (ref >39.00)
LDL Cholesterol: 51 mg/dL (ref 0–99)
NonHDL: 89.94
Total CHOL/HDL Ratio: 3
Triglycerides: 197 mg/dL — ABNORMAL HIGH (ref 0.0–149.0)
VLDL: 39.4 mg/dL (ref 0.0–40.0)

## 2018-08-20 NOTE — Assessment & Plan Note (Signed)
Secondary to degenerative disk disease in the neck vs CTS .  Needs neurology referral for EMG studies

## 2018-08-20 NOTE — Assessment & Plan Note (Signed)
He  requires use of hearing aides for management of sensorineural hearing loss

## 2018-08-20 NOTE — Assessment & Plan Note (Signed)
Well controlled on current regimen. Renal function stable, no changes today.  Lab Results  Component Value Date   CREATININE 1.15 08/19/2018   Lab Results  Component Value Date   NA 140 08/19/2018   K 3.7 08/19/2018   CL 102 08/19/2018   CO2 30 08/19/2018

## 2018-08-20 NOTE — Assessment & Plan Note (Signed)
His articulation has improved dramatically  since his hemorrhagic stroke,  But has occasional problems with word finding,  He is articulate today

## 2018-08-20 NOTE — Assessment & Plan Note (Signed)
Patient tolerating zocor for secondary prevention due to recent nonhemorrhagic left MCA territory CVA.  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

## 2018-08-20 NOTE — Assessment & Plan Note (Signed)
flomax prescribed

## 2018-08-20 NOTE — Assessment & Plan Note (Signed)
Rate controlled with  pindolol, embolic stroke risk mitigated with Eliquis.  Bothered by her relatively  low blood pressure and inability to get his heart rate up with exercise

## 2018-08-21 NOTE — Addendum Note (Signed)
Addended by: Crecencio Mc on: 08/21/2018 01:04 PM   Modules accepted: Orders

## 2018-08-25 DIAGNOSIS — J301 Allergic rhinitis due to pollen: Secondary | ICD-10-CM | POA: Diagnosis not present

## 2018-08-25 DIAGNOSIS — J3089 Other allergic rhinitis: Secondary | ICD-10-CM | POA: Diagnosis not present

## 2018-09-01 DIAGNOSIS — J301 Allergic rhinitis due to pollen: Secondary | ICD-10-CM | POA: Diagnosis not present

## 2018-09-01 DIAGNOSIS — J3089 Other allergic rhinitis: Secondary | ICD-10-CM | POA: Diagnosis not present

## 2018-09-08 DIAGNOSIS — J301 Allergic rhinitis due to pollen: Secondary | ICD-10-CM | POA: Diagnosis not present

## 2018-09-08 DIAGNOSIS — J3089 Other allergic rhinitis: Secondary | ICD-10-CM | POA: Diagnosis not present

## 2018-09-15 DIAGNOSIS — J301 Allergic rhinitis due to pollen: Secondary | ICD-10-CM | POA: Diagnosis not present

## 2018-09-15 DIAGNOSIS — J3089 Other allergic rhinitis: Secondary | ICD-10-CM | POA: Diagnosis not present

## 2018-09-22 DIAGNOSIS — J301 Allergic rhinitis due to pollen: Secondary | ICD-10-CM | POA: Diagnosis not present

## 2018-09-22 DIAGNOSIS — J3089 Other allergic rhinitis: Secondary | ICD-10-CM | POA: Diagnosis not present

## 2018-09-24 ENCOUNTER — Encounter: Payer: Self-pay | Admitting: Cardiovascular Disease

## 2018-09-24 ENCOUNTER — Other Ambulatory Visit: Payer: Self-pay

## 2018-09-24 ENCOUNTER — Ambulatory Visit (INDEPENDENT_AMBULATORY_CARE_PROVIDER_SITE_OTHER): Payer: PPO | Admitting: Cardiovascular Disease

## 2018-09-24 VITALS — BP 126/68 | HR 60 | Ht 70.0 in | Wt 201.5 lb

## 2018-09-24 DIAGNOSIS — I10 Essential (primary) hypertension: Secondary | ICD-10-CM

## 2018-09-24 DIAGNOSIS — I480 Paroxysmal atrial fibrillation: Secondary | ICD-10-CM

## 2018-09-24 DIAGNOSIS — E785 Hyperlipidemia, unspecified: Secondary | ICD-10-CM | POA: Diagnosis not present

## 2018-09-24 NOTE — Progress Notes (Signed)
Cardiology Office Note   Date:  09/24/2018   ID:  Todd Golden, Todd Golden Jan 01, 1931, MRN [id number]  PCP:  Crecencio Mc, MD  Cardiologist:   Kathlyn Sacramento, MD   Chief Complaint  Patient presents with  . Other    12 month follow up. Patient denies chest pain and SOB at this time. Meds reviewed verbally with patient.       History of Present Illness: Todd Golden is a 85 y.o. male who presents for a follow-up visit regarding paroxysmal atrial fibrillation.  Other medical problems include previous stroke, hypertension, hyperlipidemia, and glaucoma.  In November 2015, he was admitted with expressive aphasia and mild ataxia due to left MCA stroke  in the setting of atrial fibrillation.  Echocardiogram showed normal LV function well carotid Doppler showed no obstructive disease.  He was placed on Eliquis and has had no recurrence of A. fib over the years.    He has been doing very well with no recent chest pain, shortness of breath or palpitations.  His recent labs showed elevated hemoglobin A1c to 8.1.  He is trying to improve his diet and wants to get back to the gym at [facility] once they open the facility.   Past Medical History:  Diagnosis Date  . Allergy   . Arthritis   . Asthma   . Cataract   . Chronic constipation   . Colon polyps   . Cough   . GERD (gastroesophageal reflux disease)   . Glaucoma   . HOH (hard of hearing)    a. Uses hearing aids  . Hyperlipidemia   . Hypertension   . Macular degeneration   . Nephrolithiasis    stent  . PAF (paroxysmal atrial fibrillation) ([place])    a. 11/2013 Dx in setting of L MCA stroke. CHA2DS2VASc = 5-->Eliquis; b. 11/2013 Echo: Nl LV fxn. Mildly dil LA.  . Stroke ([place])    a. 11/2013 L MCA stroke.  Marland Kitchen Ureteral stricture     Past Surgical History:  Procedure Laterality Date  . CATARACT EXTRACTION W/PHACO Left 12/20/2014   Procedure: CATARACT EXTRACTION PHACO AND INTRAOCULAR LENS PLACEMENT (IOC);  Surgeon: Birder Robson, MD;  Location: [facility] ORS;  Service: Ophthalmology;  Laterality: Left;   [place]     00:59.6 AP%    21.2 CDE    12.66 fluid pack lot [id number]  . JOINT REPLACEMENT Left 2014   knee partial replacement  . kidney stent Right 1995     Current Outpatient Medications  Medication Sig Dispense Refill  . amLODipine (NORVASC) 5 MG tablet TAKE 1 TABLET BY MOUTH ONCE DAILY 90 tablet 1  . doxazosin (CARDURA) 4 MG tablet TAKE 1 TABLET BY MOUTH DAILY 90 tablet 0  . ELIQUIS 5 MG TABS tablet TAKE 1 TABLET(5 MG) BY MOUTH TWICE DAILY 60 tablet 6  . EPINEPHrine 0.3 mg/0.3 mL IJ SOAJ injection Inject 0.3 mg into the muscle as needed for anaphylaxis.    . hydrochlorothiazide (HYDRODIURIL) 25 MG tablet Take 1 tablet (25 mg total) by mouth daily. 90 tablet 1  . latanoprost (XALATAN) 0.005 % ophthalmic solution Place 1 drop into both eyes at bedtime.    . Multiple Vitamins-Minerals (PRESERVISION AREDS 2 PO) Take by mouth daily.    Marland Kitchen omeprazole (PRILOSEC) 40 MG capsule TAKE 1 CAPSULE(40 MG) BY MOUTH DAILY 90 capsule 1  . pindolol (VISKEN) 5 MG tablet TAKE 1 TABLET BY MOUTH EVERY DAY 90 tablet 1  . simvastatin (ZOCOR)  20 MG tablet TAKE 1 TABLET BY MOUTH ONCE DAILY 90 tablet 1   No current facility-administered medications for this visit.     Allergies:   Patient has no known allergies.    Social History:  The patient  reports that he has never smoked. He has never used smokeless tobacco. He reports current alcohol use. He reports that he does not use drugs.   Family History:  The patient's family history includes Arthritis in his mother; Cancer (age of onset: 25) in his mother; Stroke in his father.    ROS:  Please see the history of present illness.   Otherwise, review of systems are positive for none.   All other systems are reviewed and negative.    PHYSICAL EXAM: VS:  BP 126/68 (BP Location: Left Arm, Patient Position: Sitting, Cuff Size: Normal)   Pulse 60   Ht 5\' 10"  (1.778 m)   Wt 201 lb 8  oz (91.4 kg)   BMI 28.91 kg/m  , BMI Body mass index is 28.91 kg/m. GEN: Well nourished, well developed, in no acute distress  HEENT: normal  Neck: no JVD, carotid bruits, or masses Cardiac: RRR; no murmurs, rubs, or gallops,no edema  Respiratory:  clear to auscultation bilaterally, normal work of breathing GI: soft, nontender, nondistended, + BS MS: no deformity or atrophy  Skin: warm and dry, no rash Neuro:  Strength and sensation are intact Psych: euthymic mood, full affect   EKG:  EKG is ordered today. The ekg ordered today demonstrates normal sinus rhythm with first-degree AV block.  Minimal LVH.   Recent Labs: 08/19/2018: ALT 20; BUN 21; Creatinine, Ser 1.15; Hemoglobin 12.8; Platelets 217.0; Potassium 3.7; Sodium 140; TSH 1.46    Lipid Panel    Component Value Date/Time   CHOL 134 08/19/2018 1513   CHOL 145 07/04/2016 1001   CHOL 168 12/04/2013 0549   TRIG 197.0 (H) 08/19/2018 1513   TRIG 96 12/04/2013 0549   HDL 44.20 08/19/2018 1513   HDL 46 07/04/2016 1001   HDL 44 12/04/2013 0549   CHOLHDL 3 08/19/2018 1513   VLDL 39.4 08/19/2018 1513   VLDL 19 12/04/2013 0549   LDLCALC 51 08/19/2018 1513   LDLCALC 71 07/04/2016 1001   LDLCALC 105 (H) 12/04/2013 0549      Wt Readings from Last 3 Encounters:  09/24/18 201 lb 8 oz (91.4 kg)  08/19/18 204 lb 1.9 oz (92.6 kg)  02/12/18 203 lb 12.8 oz (92.4 kg)        No flowsheet data found.    ASSESSMENT AND PLAN:  1.  Paroxysmal atrial fibrillation: This was documented in 2015 in the setting of left MCA stroke.  He continues to be in sinus rhythm with no evidence of recurrent arrhythmia.   I reviewed his recent labs in July done with Dr. Derrel Nip.  He was mildly anemic with a hemoglobin of 12.8.  He has no signs of bleeding anywhere.  Creatinine was 1.15.  2.  Essential hypertension: Blood pressure is controlled on current medications.    3.  Hyperlipidemia: He is on simvastatin.  Recent lipid profile showed an LDL  of 51.  His triglyceride was mildly elevated but he was not fasting for labs.   Disposition:   FU with me in 1 year  Signed,  Kathlyn Sacramento, MD  09/24/2018 11:12 AM    [place]

## 2018-09-24 NOTE — Patient Instructions (Signed)
Medication Instructions:  Your physician recommends that you continue on your current medications as directed. Please refer to the Current Medication list given to you today.  If you need a refill on your cardiac medications before your next appointment, please call your pharmacy.   Lab work: None ordered If you have labs (blood work) drawn today and your tests are completely normal, you will receive your results only by: . MyChart Message (if you have MyChart) OR . A paper copy in the mail If you have any lab test that is abnormal or we need to change your treatment, we will call you to review the results.  Testing/Procedures: None ordered  Follow-Up: At [facility], you and your health needs are our priority.  As part of our continuing mission to provide you with exceptional heart care, we have created designated Provider Care Teams.  These Care Teams include your primary Cardiologist (physician) and Advanced Practice Providers (APPs -  Physician Assistants and Nurse Practitioners) who all work together to provide you with the care you need, when you need it. You will need a follow up appointment in 12 months.  Please call our office 2 months in advance to schedule this appointment.  You may see Muhammad Arida, MD or one of the following Advanced Practice Providers on your designated Care Team:   Christopher Berge, NP Ryan Dunn, PA-C . Jacquelyn Visser, PA-C  Any Other Special Instructions Will Be Listed Below (If Applicable). N/A   

## 2018-09-29 DIAGNOSIS — J3089 Other allergic rhinitis: Secondary | ICD-10-CM | POA: Diagnosis not present

## 2018-09-29 DIAGNOSIS — J301 Allergic rhinitis due to pollen: Secondary | ICD-10-CM | POA: Diagnosis not present

## 2018-10-06 DIAGNOSIS — J3089 Other allergic rhinitis: Secondary | ICD-10-CM | POA: Diagnosis not present

## 2018-10-06 DIAGNOSIS — J301 Allergic rhinitis due to pollen: Secondary | ICD-10-CM | POA: Diagnosis not present

## 2018-10-09 ENCOUNTER — Other Ambulatory Visit: Payer: Self-pay | Admitting: Cardiovascular Disease

## 2018-10-12 ENCOUNTER — Other Ambulatory Visit: Payer: Self-pay | Admitting: Cardiovascular Disease

## 2018-10-13 ENCOUNTER — Other Ambulatory Visit: Payer: Self-pay

## 2018-10-13 DIAGNOSIS — J3089 Other allergic rhinitis: Secondary | ICD-10-CM | POA: Diagnosis not present

## 2018-10-13 DIAGNOSIS — J301 Allergic rhinitis due to pollen: Secondary | ICD-10-CM | POA: Diagnosis not present

## 2018-10-13 MED ORDER — OMEPRAZOLE 40 MG PO CPDR: DELAYED_RELEASE_CAPSULE | ORAL | 3 refills | Status: DC

## 2018-10-14 DIAGNOSIS — H353211 Exudative age-related macular degeneration, right eye, with active choroidal neovascularization: Secondary | ICD-10-CM | POA: Diagnosis not present

## 2018-10-20 DIAGNOSIS — J3089 Other allergic rhinitis: Secondary | ICD-10-CM | POA: Diagnosis not present

## 2018-10-20 DIAGNOSIS — J301 Allergic rhinitis due to pollen: Secondary | ICD-10-CM | POA: Diagnosis not present

## 2018-10-27 DIAGNOSIS — J3089 Other allergic rhinitis: Secondary | ICD-10-CM | POA: Diagnosis not present

## 2018-10-27 DIAGNOSIS — J301 Allergic rhinitis due to pollen: Secondary | ICD-10-CM | POA: Diagnosis not present

## 2018-11-03 DIAGNOSIS — J301 Allergic rhinitis due to pollen: Secondary | ICD-10-CM | POA: Diagnosis not present

## 2018-11-03 DIAGNOSIS — J3089 Other allergic rhinitis: Secondary | ICD-10-CM | POA: Diagnosis not present

## 2018-11-04 ENCOUNTER — Other Ambulatory Visit: Payer: Self-pay

## 2018-11-04 ENCOUNTER — Telehealth: Payer: Self-pay | Admitting: Internal Medicine

## 2018-11-04 MED ORDER — PINDOLOL 5 MG PO TABS: 5.0000 mg | ORAL_TABLET | Freq: Every day | ORAL | 1 refills | Status: DC

## 2018-11-04 NOTE — Telephone Encounter (Signed)
Medication was refilled this morning 

## 2018-11-04 NOTE — Telephone Encounter (Signed)
Pt would like to have his pindolol (VISKEN) 5 MG tablet refilled. He only has 3 pills remaining.

## 2018-11-10 ENCOUNTER — Other Ambulatory Visit: Payer: Self-pay

## 2018-11-10 DIAGNOSIS — J301 Allergic rhinitis due to pollen: Secondary | ICD-10-CM | POA: Diagnosis not present

## 2018-11-10 DIAGNOSIS — J3089 Other allergic rhinitis: Secondary | ICD-10-CM | POA: Diagnosis not present

## 2018-11-10 MED ORDER — APIXABAN 5 MG PO TABS: ORAL_TABLET | ORAL | 8 refills | Status: DC

## 2018-11-10 NOTE — Addendum Note (Signed)
Addended by: Derrel Nip B on: 11/10/2018 01:35 PM   Modules accepted: Orders

## 2018-11-10 NOTE — Telephone Encounter (Signed)
Eliquis 5mg  refill request received. Pt is 85yrs old, weight-91.4kg, Crea-1.15 on 08/19/2018, Diagnosis-Afib, and last seen by Dr. Fletcher Anon on 09/24/2018. Dose is appropriate based on dosing criteria. Will send in refill to requested pharmacy.

## 2018-11-10 NOTE — Telephone Encounter (Signed)
*  STAT* If patient is at the pharmacy, call can be transferred to refill team.   1. Which medications need to be refilled? (please list name of each medication and dose if known) Eliquis  2. Which pharmacy/location (including street and city if local pharmacy) is medication to be sent to? [place]  3. Do they need a 30 day or 90 day supply? [place]

## 2018-11-12 DIAGNOSIS — J3089 Other allergic rhinitis: Secondary | ICD-10-CM | POA: Diagnosis not present

## 2018-11-12 DIAGNOSIS — J301 Allergic rhinitis due to pollen: Secondary | ICD-10-CM | POA: Diagnosis not present

## 2018-11-17 DIAGNOSIS — J301 Allergic rhinitis due to pollen: Secondary | ICD-10-CM | POA: Diagnosis not present

## 2018-11-17 DIAGNOSIS — J3089 Other allergic rhinitis: Secondary | ICD-10-CM | POA: Diagnosis not present

## 2018-11-19 DIAGNOSIS — J3089 Other allergic rhinitis: Secondary | ICD-10-CM | POA: Diagnosis not present

## 2018-11-19 DIAGNOSIS — J301 Allergic rhinitis due to pollen: Secondary | ICD-10-CM | POA: Diagnosis not present

## 2018-11-26 DIAGNOSIS — J301 Allergic rhinitis due to pollen: Secondary | ICD-10-CM | POA: Diagnosis not present

## 2018-11-26 DIAGNOSIS — J3089 Other allergic rhinitis: Secondary | ICD-10-CM | POA: Diagnosis not present

## 2018-12-01 DIAGNOSIS — J3089 Other allergic rhinitis: Secondary | ICD-10-CM | POA: Diagnosis not present

## 2018-12-01 DIAGNOSIS — J301 Allergic rhinitis due to pollen: Secondary | ICD-10-CM | POA: Diagnosis not present

## 2018-12-08 DIAGNOSIS — J301 Allergic rhinitis due to pollen: Secondary | ICD-10-CM | POA: Diagnosis not present

## 2018-12-08 DIAGNOSIS — J3089 Other allergic rhinitis: Secondary | ICD-10-CM | POA: Diagnosis not present

## 2018-12-10 ENCOUNTER — Telehealth: Payer: Self-pay | Admitting: Internal Medicine

## 2018-12-10 MED ORDER — SIMVASTATIN 20 MG PO TABS: ORAL_TABLET | ORAL | 1 refills | Status: DC

## 2018-12-10 NOTE — Telephone Encounter (Signed)
Pt needs a refill on his simvastatin (ZOCOR) 20 MG tablet, he only has 2 pills left.

## 2018-12-10 NOTE — Telephone Encounter (Signed)
Medication has been refilled and pt's wife is aware.

## 2018-12-15 ENCOUNTER — Other Ambulatory Visit: Payer: Self-pay

## 2018-12-15 DIAGNOSIS — J3089 Other allergic rhinitis: Secondary | ICD-10-CM | POA: Diagnosis not present

## 2018-12-15 DIAGNOSIS — J301 Allergic rhinitis due to pollen: Secondary | ICD-10-CM | POA: Diagnosis not present

## 2018-12-15 MED ORDER — AMLODIPINE BESYLATE 5 MG PO TABS: 5.0000 mg | ORAL_TABLET | Freq: Every day | ORAL | 1 refills | Status: DC

## 2018-12-22 DIAGNOSIS — J3089 Other allergic rhinitis: Secondary | ICD-10-CM | POA: Diagnosis not present

## 2018-12-22 DIAGNOSIS — J301 Allergic rhinitis due to pollen: Secondary | ICD-10-CM | POA: Diagnosis not present

## 2018-12-29 DIAGNOSIS — J301 Allergic rhinitis due to pollen: Secondary | ICD-10-CM | POA: Diagnosis not present

## 2018-12-29 DIAGNOSIS — J3089 Other allergic rhinitis: Secondary | ICD-10-CM | POA: Diagnosis not present

## 2019-01-05 DIAGNOSIS — J3089 Other allergic rhinitis: Secondary | ICD-10-CM | POA: Diagnosis not present

## 2019-01-05 DIAGNOSIS — J301 Allergic rhinitis due to pollen: Secondary | ICD-10-CM | POA: Diagnosis not present

## 2019-01-12 DIAGNOSIS — J301 Allergic rhinitis due to pollen: Secondary | ICD-10-CM | POA: Diagnosis not present

## 2019-01-12 DIAGNOSIS — J3089 Other allergic rhinitis: Secondary | ICD-10-CM | POA: Diagnosis not present

## 2019-01-13 DIAGNOSIS — H353211 Exudative age-related macular degeneration, right eye, with active choroidal neovascularization: Secondary | ICD-10-CM | POA: Diagnosis not present

## 2019-01-19 DIAGNOSIS — J301 Allergic rhinitis due to pollen: Secondary | ICD-10-CM | POA: Diagnosis not present

## 2019-01-19 DIAGNOSIS — J3089 Other allergic rhinitis: Secondary | ICD-10-CM | POA: Diagnosis not present

## 2019-01-26 DIAGNOSIS — J3089 Other allergic rhinitis: Secondary | ICD-10-CM | POA: Diagnosis not present

## 2019-01-26 DIAGNOSIS — J301 Allergic rhinitis due to pollen: Secondary | ICD-10-CM | POA: Diagnosis not present

## 2019-01-28 ENCOUNTER — Other Ambulatory Visit: Payer: Self-pay

## 2019-01-28 MED ORDER — HYDROCHLOROTHIAZIDE 25 MG PO TABS: 25.0000 mg | ORAL_TABLET | Freq: Every day | ORAL | 1 refills | Status: DC

## 2019-02-09 DIAGNOSIS — J3089 Other allergic rhinitis: Secondary | ICD-10-CM | POA: Diagnosis not present

## 2019-02-09 DIAGNOSIS — J301 Allergic rhinitis due to pollen: Secondary | ICD-10-CM | POA: Diagnosis not present

## 2019-02-15 ENCOUNTER — Ambulatory Visit: Payer: PPO | Admitting: Internal Medicine

## 2019-02-15 ENCOUNTER — Ambulatory Visit: Payer: PPO

## 2019-02-16 DIAGNOSIS — J301 Allergic rhinitis due to pollen: Secondary | ICD-10-CM | POA: Diagnosis not present

## 2019-02-16 DIAGNOSIS — J3089 Other allergic rhinitis: Secondary | ICD-10-CM | POA: Diagnosis not present

## 2019-02-22 ENCOUNTER — Ambulatory Visit: Payer: PPO | Admitting: Internal Medicine

## 2019-02-23 DIAGNOSIS — J301 Allergic rhinitis due to pollen: Secondary | ICD-10-CM | POA: Diagnosis not present

## 2019-02-23 DIAGNOSIS — J3089 Other allergic rhinitis: Secondary | ICD-10-CM | POA: Diagnosis not present

## 2019-03-04 DIAGNOSIS — H353211 Exudative age-related macular degeneration, right eye, with active choroidal neovascularization: Secondary | ICD-10-CM | POA: Diagnosis not present

## 2019-03-05 DIAGNOSIS — J3089 Other allergic rhinitis: Secondary | ICD-10-CM | POA: Diagnosis not present

## 2019-03-05 DIAGNOSIS — J301 Allergic rhinitis due to pollen: Secondary | ICD-10-CM | POA: Diagnosis not present

## 2019-03-09 DIAGNOSIS — J3089 Other allergic rhinitis: Secondary | ICD-10-CM | POA: Diagnosis not present

## 2019-03-09 DIAGNOSIS — J301 Allergic rhinitis due to pollen: Secondary | ICD-10-CM | POA: Diagnosis not present

## 2019-03-12 DIAGNOSIS — J3089 Other allergic rhinitis: Secondary | ICD-10-CM | POA: Diagnosis not present

## 2019-03-12 DIAGNOSIS — J301 Allergic rhinitis due to pollen: Secondary | ICD-10-CM | POA: Diagnosis not present

## 2019-03-16 DIAGNOSIS — J3089 Other allergic rhinitis: Secondary | ICD-10-CM | POA: Diagnosis not present

## 2019-03-16 DIAGNOSIS — J301 Allergic rhinitis due to pollen: Secondary | ICD-10-CM | POA: Diagnosis not present

## 2019-03-23 DIAGNOSIS — J3089 Other allergic rhinitis: Secondary | ICD-10-CM | POA: Diagnosis not present

## 2019-03-23 DIAGNOSIS — J301 Allergic rhinitis due to pollen: Secondary | ICD-10-CM | POA: Diagnosis not present

## 2019-03-30 DIAGNOSIS — J301 Allergic rhinitis due to pollen: Secondary | ICD-10-CM | POA: Diagnosis not present

## 2019-03-30 DIAGNOSIS — J3089 Other allergic rhinitis: Secondary | ICD-10-CM | POA: Diagnosis not present

## 2019-04-06 DIAGNOSIS — J3089 Other allergic rhinitis: Secondary | ICD-10-CM | POA: Diagnosis not present

## 2019-04-06 DIAGNOSIS — J301 Allergic rhinitis due to pollen: Secondary | ICD-10-CM | POA: Diagnosis not present

## 2019-04-13 DIAGNOSIS — J301 Allergic rhinitis due to pollen: Secondary | ICD-10-CM | POA: Diagnosis not present

## 2019-04-13 DIAGNOSIS — J3089 Other allergic rhinitis: Secondary | ICD-10-CM | POA: Diagnosis not present

## 2019-04-15 DIAGNOSIS — J301 Allergic rhinitis due to pollen: Secondary | ICD-10-CM | POA: Diagnosis not present

## 2019-04-15 DIAGNOSIS — J3089 Other allergic rhinitis: Secondary | ICD-10-CM | POA: Diagnosis not present

## 2019-04-20 DIAGNOSIS — J301 Allergic rhinitis due to pollen: Secondary | ICD-10-CM | POA: Diagnosis not present

## 2019-04-20 DIAGNOSIS — J3089 Other allergic rhinitis: Secondary | ICD-10-CM | POA: Diagnosis not present

## 2019-04-27 DIAGNOSIS — J3081 Allergic rhinitis due to animal (cat) (dog) hair and dander: Secondary | ICD-10-CM | POA: Diagnosis not present

## 2019-04-27 DIAGNOSIS — J301 Allergic rhinitis due to pollen: Secondary | ICD-10-CM | POA: Diagnosis not present

## 2019-04-27 DIAGNOSIS — J3089 Other allergic rhinitis: Secondary | ICD-10-CM | POA: Diagnosis not present

## 2019-05-04 DIAGNOSIS — J301 Allergic rhinitis due to pollen: Secondary | ICD-10-CM | POA: Diagnosis not present

## 2019-05-04 DIAGNOSIS — J3089 Other allergic rhinitis: Secondary | ICD-10-CM | POA: Diagnosis not present

## 2019-05-10 DIAGNOSIS — H353211 Exudative age-related macular degeneration, right eye, with active choroidal neovascularization: Secondary | ICD-10-CM | POA: Diagnosis not present

## 2019-05-11 DIAGNOSIS — J3089 Other allergic rhinitis: Secondary | ICD-10-CM | POA: Diagnosis not present

## 2019-05-11 DIAGNOSIS — J301 Allergic rhinitis due to pollen: Secondary | ICD-10-CM | POA: Diagnosis not present

## 2019-05-18 DIAGNOSIS — J301 Allergic rhinitis due to pollen: Secondary | ICD-10-CM | POA: Diagnosis not present

## 2019-05-18 DIAGNOSIS — J3089 Other allergic rhinitis: Secondary | ICD-10-CM | POA: Diagnosis not present

## 2019-05-21 ENCOUNTER — Telehealth: Payer: Self-pay | Admitting: Internal Medicine

## 2019-05-21 MED ORDER — PINDOLOL 5 MG PO TABS: 5.0000 mg | ORAL_TABLET | Freq: Every day | ORAL | 1 refills | Status: DC

## 2019-05-21 MED ORDER — HYDROCHLOROTHIAZIDE 25 MG PO TABS: 25.0000 mg | ORAL_TABLET | Freq: Every day | ORAL | 1 refills | Status: DC

## 2019-05-21 NOTE — Telephone Encounter (Signed)
Pt needs a refill on hydrochlorothiazide (HYDRODIURIL) 25 MG tablet and pindolol (VISKEN) 5 MG tablet. Pt has appt 05/27/19. Pt is out of medication

## 2019-05-24 ENCOUNTER — Telehealth: Payer: Self-pay | Admitting: Internal Medicine

## 2019-05-24 NOTE — Telephone Encounter (Signed)
[facility] called they are needing to talk about patient being on [facility]

## 2019-05-24 NOTE — Telephone Encounter (Signed)
Call was transferred to Dr. Derrel Nip.

## 2019-05-25 DIAGNOSIS — J301 Allergic rhinitis due to pollen: Secondary | ICD-10-CM | POA: Diagnosis not present

## 2019-05-25 DIAGNOSIS — J3089 Other allergic rhinitis: Secondary | ICD-10-CM | POA: Diagnosis not present

## 2019-05-27 ENCOUNTER — Encounter: Payer: Self-pay | Admitting: Internal Medicine

## 2019-05-27 ENCOUNTER — Other Ambulatory Visit: Payer: Self-pay

## 2019-05-27 ENCOUNTER — Ambulatory Visit (INDEPENDENT_AMBULATORY_CARE_PROVIDER_SITE_OTHER): Payer: PPO | Admitting: Internal Medicine

## 2019-05-27 DIAGNOSIS — I10 Essential (primary) hypertension: Secondary | ICD-10-CM | POA: Diagnosis not present

## 2019-05-27 DIAGNOSIS — E785 Hyperlipidemia, unspecified: Secondary | ICD-10-CM | POA: Diagnosis not present

## 2019-05-27 DIAGNOSIS — R4701 Aphasia: Secondary | ICD-10-CM | POA: Diagnosis not present

## 2019-05-27 DIAGNOSIS — D649 Anemia, unspecified: Secondary | ICD-10-CM

## 2019-05-27 DIAGNOSIS — R7303 Prediabetes: Secondary | ICD-10-CM

## 2019-05-27 LAB — CBC WITH DIFFERENTIAL/PLATELET
Basophils Absolute: 0 10*3/uL (ref 0.0–0.1)
Basophils Relative: 0.6 % (ref 0.0–3.0)
Eosinophils Absolute: 0.2 10*3/uL (ref 0.0–0.7)
Eosinophils Relative: 3.1 % (ref 0.0–5.0)
HCT: 38.5 % — ABNORMAL LOW (ref 39.0–52.0)
Hemoglobin: 13.1 g/dL (ref 13.0–17.0)
Lymphocytes Relative: 23.3 % (ref 12.0–46.0)
Lymphs Abs: 1.5 10*3/uL (ref 0.7–4.0)
MCHC: 33.9 g/dL (ref 30.0–36.0)
MCV: 92.7 fl (ref 78.0–100.0)
Monocytes Absolute: 0.8 10*3/uL (ref 0.1–1.0)
Monocytes Relative: 11.9 % (ref 3.0–12.0)
Neutro Abs: 4 10*3/uL (ref 1.4–7.7)
Neutrophils Relative %: 61.1 % (ref 43.0–77.0)
Platelets: 207 10*3/uL (ref 150.0–400.0)
RBC: 4.16 Mil/uL — ABNORMAL LOW (ref 4.22–5.81)
RDW: 13.8 % (ref 11.5–15.5)
WBC: 6.5 10*3/uL (ref 4.0–10.5)

## 2019-05-27 LAB — COMPREHENSIVE METABOLIC PANEL
ALT: 21 U/L (ref 0–53)
AST: 24 U/L (ref 0–37)
Albumin: 4.1 g/dL (ref 3.5–5.2)
Alkaline Phosphatase: 52 U/L (ref 39–117)
BUN: 20 mg/dL (ref 6–23)
CO2: 29 mEq/L (ref 19–32)
Calcium: 9.2 mg/dL (ref 8.4–10.5)
Chloride: 103 mEq/L (ref 96–112)
Creatinine, Ser: 1.12 mg/dL (ref 0.40–1.50)
GFR: 62.03 mL/min (ref >60.00)
Glucose, Bld: 116 mg/dL — ABNORMAL HIGH (ref 70–99)
Potassium: 3.9 mEq/L (ref 3.5–5.1)
Sodium: 137 mEq/L (ref 135–145)
Total Bilirubin: 0.8 mg/dL (ref 0.2–1.2)
Total Protein: 6.9 g/dL (ref 6.0–8.3)

## 2019-05-27 LAB — IBC + FERRITIN
Ferritin: 41.6 ng/mL (ref 22.0–322.0)
Iron: 97 ug/dL (ref 42–165)
Saturation Ratios: 27.8 % (ref 20.0–50.0)
Transferrin: 249 mg/dL (ref 212.0–360.0)

## 2019-05-27 LAB — B12 AND FOLATE PANEL
Folate: >23.7 ng/mL (ref >5.9)
Vitamin B-12: 399 pg/mL (ref 211–911)

## 2019-05-27 LAB — HEMOGLOBIN A1C: Hgb A1c MFr Bld: 6 % (ref 4.6–6.5)

## 2019-05-27 MED ORDER — PINDOLOL 10 MG PO TABS: 10.0000 mg | ORAL_TABLET | Freq: Every day | ORAL | 0 refills | Status: DC

## 2019-05-27 NOTE — Patient Instructions (Signed)
I HAVE INCREASED YOUR PINDOLOL TO 10 MG TO SLOW YOUR HEART RATE DOWN A BIT  IF IT MAKES YOU DIZZY ,  CUT THE DOSE IN HALF  SEE YOU IN 6 MONTHS

## 2019-05-27 NOTE — Progress Notes (Signed)
Subjective:  Patient ID: Todd Golden, male    DOB: Jun 14, 1930  Age: 86 y.o. MRN: [id number]  CC: Diagnoses of Prediabetes, Anemia, unspecified type, Essential hypertension, benign, Hyperlipidemia LDL goal <100, and Broca's aphasia were pertinent to this visit.  HPI Todd Golden presents for follow up on prediabetes and hypertension  Has moved out of [facility] to a provate home (less $$$)   Had a fall in November and struck his head on the refrigerator,  Had LOC lasting 1-2 minutes.   DID NOT GO TO ER.  On Eliquis.  Developed two black eyes,  Glasses broke.  Since then has had more aphasia /word finding difficulties..  Uses a stool in the yard when gardening  .  International aid/development worker.   Has a seat in the shower   Macular degeneration:  Has not had to have injections I n  2 months   Getting allergy shots every week by Orvil Feil.  Working well   HTN:  He has been Measuring BP at [facility]  120/70.   No orthostasis   Outpatient Medications Prior to Visit  Medication Sig Dispense Refill  . amLODipine (NORVASC) 5 MG tablet Take 1 tablet (5 mg total) by mouth daily. 90 tablet 1  . apixaban (ELIQUIS) 5 MG TABS tablet TAKE 1 TABLET(5 MG) BY MOUTH TWICE DAILY 60 tablet 8  . doxazosin (CARDURA) 4 MG tablet TAKE 1 TABLET BY MOUTH DAILY 90 tablet 3  . EPINEPHrine 0.3 mg/0.3 mL IJ SOAJ injection Inject 0.3 mg into the muscle as needed for anaphylaxis.    . hydrochlorothiazide (HYDRODIURIL) 25 MG tablet Take 1 tablet (25 mg total) by mouth daily. 90 tablet 1  . latanoprost (XALATAN) 0.005 % ophthalmic solution Place 1 drop into both eyes at bedtime.    . Multiple Vitamins-Minerals (PRESERVISION AREDS 2 PO) Take by mouth daily.    Marland Kitchen omeprazole (PRILOSEC) 40 MG capsule TAKE 1 CAPSULE(40 MG) BY MOUTH DAILY 90 capsule 3  . simvastatin (ZOCOR) 20 MG tablet TAKE 1 TABLET BY MOUTH ONCE DAILY 90 tablet 1  . pindolol (VISKEN) 5 MG tablet Take 1 tablet (5 mg total) by mouth daily. 90 tablet 1   No  facility-administered medications prior to visit.    Review of Systems;  Patient denies headache, fevers, malaise, unintentional weight loss, skin rash, eye pain, sinus congestion and sinus pain, sore throat, dysphagia,  hemoptysis , cough, dyspnea, wheezing, chest pain, palpitations, orthopnea, edema, abdominal pain, nausea, melena, diarrhea, constipation, flank pain, dysuria, hematuria, urinary  Frequency, nocturia, numbness, tingling, seizures,  Focal weakness, Loss of consciousness,  Tremor, insomnia, depression, anxiety, and suicidal ideation.      Objective:  BP 132/80 (BP Location: Left Arm, Patient Position: Sitting, Cuff Size: Normal)   Pulse 74   Temp 97.8 F (36.6 C) (Temporal)   Resp 16   Ht 5\' 10"  (1.778 m)   Wt 196 lb (88.9 kg)   SpO2 98%   BMI 28.12 kg/m   BP Readings from Last 3 Encounters:  05/27/19 132/80  09/24/18 126/68  08/19/18 120/68    Wt Readings from Last 3 Encounters:  05/27/19 196 lb (88.9 kg)  09/24/18 201 lb 8 oz (91.4 kg)  08/19/18 204 lb 1.9 oz (92.6 kg)    General appearance: alert, cooperative and appears stated age Ears: normal TM's and external ear canals both ears Throat: lips, mucosa, and tongue normal; teeth and gums normal Neck: no adenopathy, no carotid bruit, supple, symmetrical, trachea midline and  thyroid not enlarged, symmetric, no tenderness/mass/nodules Back: symmetric, no curvature. ROM normal. No CVA tenderness. Lungs: clear to auscultation bilaterally Heart: regular rate and rhythm, S1, S2 normal, no murmur, click, rub or gallop Abdomen: soft, non-tender; bowel sounds normal; no masses,  no organomegaly Pulses: 2+ and symmetric Skin: Skin color, texture, turgor normal. No rashes or lesions Lymph nodes: Cervical, supraclavicular, and axillary nodes normal.  Lab Results  Component Value Date   HGBA1C 6.0 05/27/2019   HGBA1C 6.1 08/19/2018   HGBA1C 5.9 12/04/2013    Lab Results  Component Value Date   CREATININE 1.12  05/27/2019   CREATININE 1.15 08/19/2018   CREATININE 1.01 07/29/2017    Lab Results  Component Value Date   WBC 6.5 05/27/2019   HGB 13.1 05/27/2019   HCT 38.5 (L) 05/27/2019   PLT 207.0 05/27/2019   GLUCOSE 116 (H) 05/27/2019   CHOL 134 08/19/2018   TRIG 197.0 (H) 08/19/2018   HDL 44.20 08/19/2018   LDLCALC 51 08/19/2018   ALT 21 05/27/2019   AST 24 05/27/2019   NA 137 05/27/2019   K 3.9 05/27/2019   CL 103 05/27/2019   CREATININE 1.12 05/27/2019   BUN 20 05/27/2019   CO2 29 05/27/2019   TSH 1.46 08/19/2018   PSA 2.97 10/21/2012   HGBA1C 6.0 05/27/2019    No results found.  Assessment & Plan:   Problem List Items Addressed This Visit      Unprioritized   Broca's aphasia    His articulation has improved dramatically  since his hemorrhagic stroke,  But has had more problems with word finding since his recent fall.   He is articulate today      Essential hypertension, benign    Well controlled on current regimen. Renal function stable, no changes today.  Lab Results  Component Value Date   CREATININE 1.12 05/27/2019   Lab Results  Component Value Date   NA 137 05/27/2019   K 3.9 05/27/2019   CL 103 05/27/2019   CO2 29 05/27/2019         Relevant Medications   pindolol (VISKEN) 10 MG tablet   Hyperlipidemia LDL goal <100    Patient tolerating zocor for secondary prevention due to recent nonhemorrhagic left MCA territory CVA.  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  I recommend he follow a low glycemic index diet and particpate regularly in an aerobic  exercise activity.  We should check an A1c in 6  months.   Lab Results  Component Value Date   HGBA1C 6.0 05/27/2019          Other Visit Diagnoses    Anemia, unspecified type          I have changed Hunt Oris. Leisey's pindolol. I am also having him maintain his latanoprost, Multiple Vitamins-Minerals (PRESERVISION AREDS 2 PO), EPINEPHrine, doxazosin, omeprazole, apixaban, simvastatin, amLODipine, and hydrochlorothiazide.  Meds ordered this encounter  Medications  . pindolol (VISKEN) 10 MG tablet    Sig: Take 1 tablet (10 mg total) by mouth daily.    Dispense:  90 tablet    Refill:  0    NOTE DOSE CHANGE.  PATIENT PICKING UP TODAY    Medications Discontinued During This Encounter  Medication Reason  . pindolol (VISKEN) 5 MG tablet     Follow-up: No follow-ups on file.   Crecencio Mc, MD

## 2019-05-29 DIAGNOSIS — R7303 Prediabetes: Secondary | ICD-10-CM | POA: Insufficient documentation

## 2019-05-29 NOTE — Assessment & Plan Note (Signed)
Patient tolerating zocor for secondary prevention due to recent nonhemorrhagic left MCA territory CVA.  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

## 2019-05-29 NOTE — Assessment & Plan Note (Signed)
His articulation has improved dramatically  since his hemorrhagic stroke,  But has had more problems with word finding since his recent fall.   He is articulate today

## 2019-05-29 NOTE — Assessment & Plan Note (Signed)
His  random glucose is not  elevated but his A1c suggests she is at risk for developing diabetes.  I recommend he follow a low glycemic index diet and particpate regularly in an aerobic  exercise activity.  We should check an A1c in 6 months.   Lab Results  Component Value Date   HGBA1C 6.0 05/27/2019

## 2019-05-29 NOTE — Assessment & Plan Note (Signed)
Well controlled on current regimen. Renal function stable, no changes today.  Lab Results  Component Value Date   CREATININE 1.12 05/27/2019   Lab Results  Component Value Date   NA 137 05/27/2019   K 3.9 05/27/2019   CL 103 05/27/2019   CO2 29 05/27/2019

## 2019-06-01 DIAGNOSIS — J3089 Other allergic rhinitis: Secondary | ICD-10-CM | POA: Diagnosis not present

## 2019-06-01 DIAGNOSIS — J301 Allergic rhinitis due to pollen: Secondary | ICD-10-CM | POA: Diagnosis not present

## 2019-06-08 DIAGNOSIS — J3089 Other allergic rhinitis: Secondary | ICD-10-CM | POA: Diagnosis not present

## 2019-06-08 DIAGNOSIS — J301 Allergic rhinitis due to pollen: Secondary | ICD-10-CM | POA: Diagnosis not present

## 2019-06-09 ENCOUNTER — Telehealth: Payer: Self-pay | Admitting: Internal Medicine

## 2019-06-09 MED ORDER — SIMVASTATIN 20 MG PO TABS: ORAL_TABLET | ORAL | 1 refills | Status: DC

## 2019-06-09 NOTE — Telephone Encounter (Signed)
Pt is out of simvastatin (ZOCOR) 20 MG tablet and needs a few refills

## 2019-06-18 ENCOUNTER — Other Ambulatory Visit: Payer: Self-pay

## 2019-06-18 MED ORDER — AMLODIPINE BESYLATE 5 MG PO TABS: 5.0000 mg | ORAL_TABLET | Freq: Every day | ORAL | 1 refills | Status: DC

## 2019-06-22 DIAGNOSIS — J3089 Other allergic rhinitis: Secondary | ICD-10-CM | POA: Diagnosis not present

## 2019-06-22 DIAGNOSIS — J301 Allergic rhinitis due to pollen: Secondary | ICD-10-CM | POA: Diagnosis not present

## 2019-06-29 DIAGNOSIS — J301 Allergic rhinitis due to pollen: Secondary | ICD-10-CM | POA: Diagnosis not present

## 2019-06-29 DIAGNOSIS — J3089 Other allergic rhinitis: Secondary | ICD-10-CM | POA: Diagnosis not present

## 2019-07-05 DIAGNOSIS — H353211 Exudative age-related macular degeneration, right eye, with active choroidal neovascularization: Secondary | ICD-10-CM | POA: Diagnosis not present

## 2019-07-06 DIAGNOSIS — J301 Allergic rhinitis due to pollen: Secondary | ICD-10-CM | POA: Diagnosis not present

## 2019-07-06 DIAGNOSIS — J3089 Other allergic rhinitis: Secondary | ICD-10-CM | POA: Diagnosis not present

## 2019-07-12 ENCOUNTER — Telehealth: Payer: Self-pay | Admitting: Internal Medicine

## 2019-07-12 MED ORDER — AMLODIPINE BESYLATE 5 MG PO TABS: 5.0000 mg | ORAL_TABLET | Freq: Every day | ORAL | 1 refills | Status: DC

## 2019-07-12 NOTE — Telephone Encounter (Signed)
Patient needs a refill on his amLODipine (NORVASC) 5 MG tablet

## 2019-07-12 NOTE — Telephone Encounter (Signed)
Medication has been refilled and pt is aware.  

## 2019-07-13 DIAGNOSIS — J3089 Other allergic rhinitis: Secondary | ICD-10-CM | POA: Diagnosis not present

## 2019-07-13 DIAGNOSIS — J301 Allergic rhinitis due to pollen: Secondary | ICD-10-CM | POA: Diagnosis not present

## 2019-07-19 ENCOUNTER — Ambulatory Visit: Payer: PPO

## 2019-07-19 ENCOUNTER — Other Ambulatory Visit: Payer: Self-pay | Admitting: Cardiovascular Disease

## 2019-07-20 DIAGNOSIS — J3089 Other allergic rhinitis: Secondary | ICD-10-CM | POA: Diagnosis not present

## 2019-07-20 DIAGNOSIS — J301 Allergic rhinitis due to pollen: Secondary | ICD-10-CM | POA: Diagnosis not present

## 2019-07-27 DIAGNOSIS — J301 Allergic rhinitis due to pollen: Secondary | ICD-10-CM | POA: Diagnosis not present

## 2019-07-27 DIAGNOSIS — J3089 Other allergic rhinitis: Secondary | ICD-10-CM | POA: Diagnosis not present

## 2019-08-05 DIAGNOSIS — J301 Allergic rhinitis due to pollen: Secondary | ICD-10-CM | POA: Diagnosis not present

## 2019-08-05 DIAGNOSIS — J3089 Other allergic rhinitis: Secondary | ICD-10-CM | POA: Diagnosis not present

## 2019-08-10 DIAGNOSIS — J3089 Other allergic rhinitis: Secondary | ICD-10-CM | POA: Diagnosis not present

## 2019-08-10 DIAGNOSIS — J301 Allergic rhinitis due to pollen: Secondary | ICD-10-CM | POA: Diagnosis not present

## 2019-08-17 DIAGNOSIS — J3089 Other allergic rhinitis: Secondary | ICD-10-CM | POA: Diagnosis not present

## 2019-08-17 DIAGNOSIS — J301 Allergic rhinitis due to pollen: Secondary | ICD-10-CM | POA: Diagnosis not present

## 2019-08-24 ENCOUNTER — Other Ambulatory Visit: Payer: Self-pay

## 2019-08-24 ENCOUNTER — Telehealth: Payer: Self-pay | Admitting: Internal Medicine

## 2019-08-24 DIAGNOSIS — J3089 Other allergic rhinitis: Secondary | ICD-10-CM | POA: Diagnosis not present

## 2019-08-24 DIAGNOSIS — J301 Allergic rhinitis due to pollen: Secondary | ICD-10-CM | POA: Diagnosis not present

## 2019-08-24 MED ORDER — PINDOLOL 10 MG PO TABS: 10.0000 mg | ORAL_TABLET | Freq: Every day | ORAL | 0 refills | Status: DC

## 2019-08-24 NOTE — Telephone Encounter (Signed)
Patient came into office. He would like his pindolol (VISKEN) 10 MG tablet refilled, he is out.

## 2019-08-27 DIAGNOSIS — J301 Allergic rhinitis due to pollen: Secondary | ICD-10-CM | POA: Diagnosis not present

## 2019-08-27 DIAGNOSIS — J3089 Other allergic rhinitis: Secondary | ICD-10-CM | POA: Diagnosis not present

## 2019-08-31 DIAGNOSIS — J301 Allergic rhinitis due to pollen: Secondary | ICD-10-CM | POA: Diagnosis not present

## 2019-08-31 DIAGNOSIS — J3089 Other allergic rhinitis: Secondary | ICD-10-CM | POA: Diagnosis not present

## 2019-09-04 ENCOUNTER — Other Ambulatory Visit: Payer: Self-pay | Admitting: Cardiovascular Disease

## 2019-09-06 NOTE — Telephone Encounter (Signed)
Please review for refill. Thanks!  

## 2019-09-06 NOTE — Telephone Encounter (Signed)
Pt's age 86, wt 88.9 kg, SCr 1.12, CrCl 58.43, last ov w/ MA 09/24/18- next appt 10/21/19.

## 2019-09-07 DIAGNOSIS — J3089 Other allergic rhinitis: Secondary | ICD-10-CM | POA: Diagnosis not present

## 2019-09-07 DIAGNOSIS — J301 Allergic rhinitis due to pollen: Secondary | ICD-10-CM | POA: Diagnosis not present

## 2019-09-14 DIAGNOSIS — J3089 Other allergic rhinitis: Secondary | ICD-10-CM | POA: Diagnosis not present

## 2019-09-14 DIAGNOSIS — J301 Allergic rhinitis due to pollen: Secondary | ICD-10-CM | POA: Diagnosis not present

## 2019-09-16 DIAGNOSIS — J301 Allergic rhinitis due to pollen: Secondary | ICD-10-CM | POA: Diagnosis not present

## 2019-09-16 DIAGNOSIS — J3089 Other allergic rhinitis: Secondary | ICD-10-CM | POA: Diagnosis not present

## 2019-09-21 DIAGNOSIS — J301 Allergic rhinitis due to pollen: Secondary | ICD-10-CM | POA: Diagnosis not present

## 2019-09-21 DIAGNOSIS — J3089 Other allergic rhinitis: Secondary | ICD-10-CM | POA: Diagnosis not present

## 2019-09-23 ENCOUNTER — Ambulatory Visit (INDEPENDENT_AMBULATORY_CARE_PROVIDER_SITE_OTHER): Payer: PPO

## 2019-09-23 VITALS — Ht 70.0 in | Wt 196.0 lb

## 2019-09-23 DIAGNOSIS — Z0000 Encounter for general adult medical examination without abnormal findings: Secondary | ICD-10-CM

## 2019-09-23 DIAGNOSIS — J3089 Other allergic rhinitis: Secondary | ICD-10-CM | POA: Diagnosis not present

## 2019-09-23 DIAGNOSIS — J301 Allergic rhinitis due to pollen: Secondary | ICD-10-CM | POA: Diagnosis not present

## 2019-09-23 NOTE — Progress Notes (Addendum)
Subjective:   Todd Golden is a 86 y.o. male who presents for Medicare Annual/Subsequent preventive examination.  Review of Systems    No ROS.  Medicare Wellness Virtual Visit.    Cardiac Risk Factors include: advanced age (>35men, >78 women);hypertension;male gender     Objective:    Today's Vitals   09/23/19 0902  Weight: 196 lb (88.9 kg)  Height: 5\' 10"  (1.778 m)   Body mass index is 28.12 kg/m.  Advanced Directives 09/23/2019 02/12/2018 02/11/2017 02/12/2016 02/10/2015 12/20/2014 01/24/2014  Does Patient Have a Medical Advance Directive? Yes Yes Yes Yes Yes Yes Yes  Type of Advance Directive Out of facility DNR (pink MOST or yellow form);[place];Living will [place];Living will [place];Living will;Out of facility DNR (pink MOST or yellow form) [place];Living will [place];Living will - [place];Living will  Does patient want to make changes to medical advance directive? No - Patient declined No - Patient declined No - Patient declined No - Patient declined No - Patient declined - -  Copy of [place] in Chart? Yes - validated most recent copy scanned in chart (See row information) Yes - validated most recent copy scanned in chart (See row information) Yes Yes No - copy requested - -    Current Medications (verified) Outpatient Encounter Medications as of 09/23/2019  Medication Sig   amLODipine (NORVASC) 5 MG tablet Take 1 tablet (5 mg total) by mouth daily.   doxazosin (CARDURA) 4 MG tablet TAKE 1 TABLET BY MOUTH DAILY   ELIQUIS 5 MG TABS tablet TAKE 1 TABLET(5 MG) BY MOUTH TWICE DAILY   EPINEPHrine 0.3 mg/0.3 mL IJ SOAJ injection Inject 0.3 mg into the muscle as needed for anaphylaxis.   hydrochlorothiazide (HYDRODIURIL) 25 MG tablet Take 1 tablet (25 mg total) by mouth daily.   latanoprost (XALATAN) 0.005 % ophthalmic solution Place 1  drop into both eyes at bedtime.   Multiple Vitamins-Minerals (PRESERVISION AREDS 2 PO) Take by mouth daily.   omeprazole (PRILOSEC) 40 MG capsule TAKE 1 CAPSULE(40 MG) BY MOUTH DAILY   pindolol (VISKEN) 10 MG tablet Take 1 tablet (10 mg total) by mouth daily.   simvastatin (ZOCOR) 20 MG tablet TAKE 1 TABLET BY MOUTH ONCE DAILY   No facility-administered encounter medications on file as of 09/23/2019.    Allergies (verified) Patient has no known allergies.   History: Past Medical History:  Diagnosis Date   Allergy    Arthritis    Asthma    Cataract    Chronic constipation    Colon polyps    Cough    GERD (gastroesophageal reflux disease)    Glaucoma    HOH (hard of hearing)    a. Uses hearing aids   Hyperlipidemia    Hypertension    Macular degeneration    Nephrolithiasis    stent   PAF (paroxysmal atrial fibrillation) ([place])    a. 11/2013 Dx in setting of L MCA stroke. CHA2DS2VASc = 5-->Eliquis; b. 11/2013 Echo: Nl LV fxn. Mildly dil LA.   Stroke [facility])    a. 11/2013 L MCA stroke.   Ureteral stricture    Past Surgical History:  Procedure Laterality Date   CATARACT EXTRACTION W/PHACO Left 12/20/2014   Procedure: CATARACT EXTRACTION PHACO AND INTRAOCULAR LENS PLACEMENT (IOC);  Surgeon: Birder Robson, MD;  Location: [facility] ORS;  Service: Ophthalmology;  Laterality: Left;   [place]     00:59.6  AP%    21.2 CDE    12.66 fluid pack lot #[id number]   JOINT REPLACEMENT Left 2014   knee partial replacement   kidney stent Right 1995   Family History  Problem Relation Age of Onset   Arthritis Mother    Cancer Mother 58       kidney   Stroke Father    Social History   Socioeconomic History   Marital status: Married    Spouse name: Not on file   Number of children: Not on file   Years of education: Not on file   Highest education level: Not on file  Occupational History   Not on file  Tobacco Use   Smoking status: Never Smoker   Smokeless tobacco: Never Used  Vaping Use    Vaping Use: Never used  Substance and Sexual Activity   Alcohol use: Yes    Comment: rarely   Drug use: No   Sexual activity: Not Currently  Other Topics Concern   Not on file  Social History Narrative   Not on file   Social Determinants of Health   Financial Resource Strain: Low Risk    Difficulty of Paying Living Expenses: Not hard at all  Food Insecurity: No Food Insecurity   Worried About Charity fundraiser in the Last Year: Never true   [place] in the Last Year: Never true  Transportation Needs: No Transportation Needs   Lack of Transportation (Medical): No   Lack of Transportation (Non-Medical): No  Physical Activity:    Days of Exercise per Week: Not on file   Minutes of Exercise per Session: Not on file  Stress:    Feeling of Stress : Not on file  Social Connections: Unknown   Frequency of Communication with Friends and Family: More than three times a week   Frequency of Social Gatherings with Friends and Family: More than three times a week   Attends Religious Services: Not on Electrical engineer or Organizations: Not on file   Attends Archivist Meetings: Not on file   Marital Status: Married    Tobacco Counseling Counseling given: Not Answered   Clinical Intake:  Pre-visit preparation completed: Yes        Diabetes: No  How often do you need to have someone help you when you read instructions, pamphlets, or other written materials from your doctor or pharmacy?: 1 - Never Interpreter Needed?: No      Activities of Daily Living In your present state of health, do you have any difficulty performing the following activities: 09/23/2019  Hearing? Y  Vision? N  Difficulty concentrating or making decisions? N  Walking or climbing stairs? N  Dressing or bathing? N  Doing errands, shopping? N  Preparing Food and eating ? N  Using the Toilet? N  In the past six months, have you accidently leaked urine? N  Do you have  problems with loss of bowel control? N  Managing your Medications? N  Managing your Finances? Y  Comment Wife manages  Housekeeping or managing your Housekeeping? N  Some recent data might be hidden    Patient Care Team: Crecencio Mc, MD as PCP - General (Internal Medicine) Wellington Hampshire, MD as PCP - Cardiology (Cardiology)  Indicate any recent Medical Services you may have received from other than [facility] providers in the past year (date may be approximate).     Assessment:   This is  a routine wellness examination for Todd Golden.  I connected with Traveon today by telephone and verified that I am speaking with the correct person using two identifiers. Location patient: home Location provider: work Persons participating in the virtual visit: patient, Marine scientist.    I discussed the limitations, risks, security and privacy concerns of performing an evaluation and management service by telephone and the availability of in person appointments. The patient expressed understanding and verbally consented to this telephonic visit.    Interactive audio and video telecommunications were attempted between this provider and patient, however failed, due to patient having technical difficulties OR patient did not have access to video capability.  We continued and completed visit with audio only.  Some vital signs may be absent or patient reported.   Hearing/Vision screen  Hearing Screening   125Hz  250Hz  500Hz  1000Hz  2000Hz  3000Hz  4000Hz  6000Hz  8000Hz   Right ear:           Left ear:           Comments: Hearing aids  Vision Screening Comments: Cataract extraction, bilateral Visual acuity not assessed, virtual visit.  They have seen their ophthalmologist in the last 12 months.     Dietary issues and exercise activities discussed: Current Exercise Habits: Home exercise routine, Type of exercise: walking, Time (Minutes): 30, Frequency (Times/Week): 5, Weekly Exercise (Minutes/Week): 150, Intensity:  Mild  Healthy diet; vegetables mostly Good water intake  Goals       Patient Stated     DIET - REDUCE PORTION SIZE (pt-stated)      Healthy diet       Depression Screen PHQ 2/9 Scores 09/23/2019 05/27/2019 02/12/2018 02/11/2017 02/12/2016 02/10/2015 02/08/2015  PHQ - 2 Score 0 3 0 0 0 0 0  PHQ- 9 Score - 6 - 0 - - -    Fall Risk Fall Risk  09/23/2019 05/27/2019 02/12/2018 02/11/2017 02/12/2016  Falls in the past year? 0 1 0 Yes No  Number falls in past yr: 0 0 - - -  Injury with Fall? - 1 - - -  Follow up Falls evaluation completed Falls evaluation completed - - -   Handrails in use when climbing stairs? Yes  Home free of loose throw rugs in walkways, pet beds, electrical cords, etc? Yes  Adequate lighting in your home to reduce risk of falls? Yes   ASSISTIVE DEVICES UTILIZED TO PREVENT FALLS: Life alert? No  Use of a cane, walker or w/c? No    TIMED UP AND GO:  Was the test performed? No . Virtual visit.   Cognitive Function: Patient is alert and oriented x3.   MMSE - Mini Mental State Exam 02/12/2016 02/10/2015  Orientation to time 5 5  Orientation to Place 5 5  Registration 3 3  Attention/ Calculation 5 5  Recall 3 3  Language- name 2 objects 2 2  Language- repeat 1 1  Language- follow 3 step command 3 3  Language- read & follow direction 1 1  Write a sentence 1 1  Copy design 1 1  Total score 30 30     6CIT Screen 09/23/2019 02/12/2018 02/11/2017  What Year? 0 points 0 points 0 points  What month? 0 points 0 points 0 points  What time? - 0 points 0 points  Count back from 20 - 0 points 0 points  Months in reverse 0 points 0 points 0 points  Repeat phrase - 0 points 0 points  Total Score - 0 0    Immunizations  Immunization History  Administered Date(s) Administered   Influenza,inj,Quad PF,6+ Mos 10/21/2012   Influenza-Unspecified 11/09/2013, 11/18/2014, 11/10/2015, 11/20/2016, 11/19/2017, 11/08/2018   Moderna SARS-COVID-2 Vaccination 02/23/2019, 03/23/2019    Pneumococcal Conjugate-13 12/10/2013   Pneumococcal Polysaccharide-23 10/21/2009, 02/08/2015   Tdap 06/12/2013   Zoster 09/27/2015   Health Maintenance Health Maintenance  Topic Date Due   INFLUENZA VACCINE  04/20/2020 (Originally 08/22/2019)   TETANUS/TDAP  06/13/2023   COVID-19 Vaccine  Completed   PNA vac Low Risk Adult  Completed    Dental Screening: Recommended annual dental exams for proper oral hygiene.  Community Resource Referral / Chronic Care Management: CRR required this visit?  No   CCM required this visit?  No      Plan:   Keep all routine maintenance appointments.   Follow up 12/01/19 @ 8:30  I have personally reviewed and noted the following in the patient's chart:   Medical and social history Use of alcohol, tobacco or illicit drugs  Current medications and supplements Functional ability and status Nutritional status Physical activity Advanced directives List of other physicians Hospitalizations, surgeries, and ER visits in previous 12 months Vitals Screenings to include cognitive, depression, and falls Referrals and appointments  In addition, I have reviewed and discussed with patient certain preventive protocols, quality metrics, and best practice recommendations. A written personalized care plan for preventive services as well as general preventive health recommendations were provided to patient via mychart.     OBrien-Blaney, Laycee Fitzsimmons L, LPN   07/29/9379    I have reviewed the above information and agree with above.   Deborra Medina, MD

## 2019-09-23 NOTE — Patient Instructions (Addendum)
[name] , Thank you for taking time to come for your Medicare Wellness Visit. I appreciate your ongoing commitment to your health goals. Please review the following plan we discussed and let me know if I can assist you in the future.   These are the goals we discussed: Goals      Patient Stated   .  DIET - REDUCE PORTION SIZE (pt-stated)      Healthy diet       This is a list of the screening recommended for you and due dates:  Health Maintenance  Topic Date Due  . Flu Shot  04/20/2020*  . Tetanus Vaccine  06/13/2023  . COVID-19 Vaccine  Completed  . Pneumonia vaccines  Completed  *Topic was postponed. The date shown is not the original due date.    Immunizations Immunization History  Administered Date(s) Administered  . Influenza,inj,Quad PF,6+ Mos 10/21/2012  . Influenza-Unspecified 11/09/2013, 11/18/2014, 11/10/2015, 11/20/2016, 11/19/2017, 11/08/2018  . Moderna SARS-COVID-2 Vaccination 02/23/2019, 03/23/2019  . Pneumococcal Conjugate-13 12/10/2013  . Pneumococcal Polysaccharide-23 10/21/2009, 02/08/2015  . Tdap 06/12/2013  . Zoster 09/27/2015   Keep all routine maintenance appointments.   Follow up 12/01/19 @ 8:30  Advanced directives: yes, on file  Conditions/risks identified: none new  Follow up in one year for your annual wellness visit.   Preventive Care 52 Years and Older, Male Preventive care refers to lifestyle choices and visits with your health care provider that can promote health and wellness. What does preventive care include?  A yearly physical exam. This is also called an annual well check.  Dental exams once or twice a year.  Routine eye exams. Ask your health care provider how often you should have your eyes checked.  Personal lifestyle choices, including:  Daily care of your teeth and gums.  Regular physical activity.  Eating a healthy diet.  Avoiding tobacco and drug use.  Limiting alcohol use.  Practicing safe  sex.  Taking low doses of aspirin every day.  Taking vitamin and mineral supplements as recommended by your health care provider. What happens during an annual well check? The services and screenings done by your health care provider during your annual well check will depend on your age, overall health, lifestyle risk factors, and family history of disease. Counseling  Your health care provider may ask you questions about your:  Alcohol use.  Tobacco use.  Drug use.  Emotional well-being.  Home and relationship well-being.  Sexual activity.  Eating habits.  History of falls.  Memory and ability to understand (cognition).  Work and work Statistician. Screening  You may have the following tests or measurements:  Height, weight, and BMI.  Blood pressure.  Lipid and cholesterol levels. These may be checked every 5 years, or more frequently if you are over 76 years old.  Skin check.  Lung cancer screening. You may have this screening every year starting at age 67 if you have a 30-pack-year history of smoking and currently smoke or have quit within the past 15 years.  Fecal occult blood test (FOBT) of the stool. You may have this test every year starting at age 7.  Flexible sigmoidoscopy or colonoscopy. You may have a sigmoidoscopy every 5 years or a colonoscopy every 10 years starting at age 2.  Prostate cancer screening. Recommendations will vary depending on your family history and other risks.  Hepatitis C blood test.  Hepatitis B blood test.  Sexually transmitted disease (STD) testing.  Diabetes screening. This is done  by checking your blood sugar (glucose) after you have not eaten for a while (fasting). You may have this done every 1-3 years.  Abdominal aortic aneurysm (AAA) screening. You may need this if you are a current or former smoker.  Osteoporosis. You may be screened starting at age 14 if you are at high risk. Talk with your health care provider  about your test results, treatment options, and if necessary, the need for more tests. Vaccines  Your health care provider may recommend certain vaccines, such as:  Influenza vaccine. This is recommended every year.  Tetanus, diphtheria, and acellular pertussis (Tdap, Td) vaccine. You may need a Td booster every 10 years.  Zoster vaccine. You may need this after age 73.  Pneumococcal 13-valent conjugate (PCV13) vaccine. One dose is recommended after age 35.  Pneumococcal polysaccharide (PPSV23) vaccine. One dose is recommended after age 64. Talk to your health care provider about which screenings and vaccines you need and how often you need them. This information is not intended to replace advice given to you by your health care provider. Make sure you discuss any questions you have with your health care provider. Document Released: 02/03/2015 Document Revised: 09/27/2015 Document Reviewed: 11/08/2014 Elsevier Interactive Patient Education  2017 [place] Prevention in the Home Falls can cause injuries. They can happen to people of all ages. There are many things you can do to make your home safe and to help prevent falls. What can I do on the outside of my home?  Regularly fix the edges of walkways and driveways and fix any cracks.  Remove anything that might make you trip as you walk through a door, such as a raised step or threshold.  Trim any bushes or trees on the path to your home.  Use bright outdoor lighting.  Clear any walking paths of anything that might make someone trip, such as rocks or tools.  Regularly check to see if handrails are loose or broken. Make sure that both sides of any steps have handrails.  Any raised decks and porches should have guardrails on the edges.  Have any leaves, snow, or ice cleared regularly.  Use sand or salt on walking paths during winter.  Clean up any spills in your garage right away. This includes oil or grease  spills. What can I do in the bathroom?  Use night lights.  Install grab bars by the toilet and in the tub and shower. Do not use towel bars as grab bars.  Use non-skid mats or decals in the tub or shower.  If you need to sit down in the shower, use a plastic, non-slip stool.  Keep the floor dry. Clean up any water that spills on the floor as soon as it happens.  Remove soap buildup in the tub or shower regularly.  Attach bath mats securely with double-sided non-slip rug tape.  Do not have throw rugs and other things on the floor that can make you trip. What can I do in the bedroom?  Use night lights.  Make sure that you have a light by your bed that is easy to reach.  Do not use any sheets or blankets that are too big for your bed. They should not hang down onto the floor.  Have a firm chair that has side arms. You can use this for support while you get dressed.  Do not have throw rugs and other things on the floor that can make you trip. What can  I do in the kitchen?  Clean up any spills right away.  Avoid walking on wet floors.  Keep items that you use a lot in easy-to-reach places.  If you need to reach something above you, use a strong step stool that has a grab bar.  Keep electrical cords out of the way.  Do not use floor polish or wax that makes floors slippery. If you must use wax, use non-skid floor wax.  Do not have throw rugs and other things on the floor that can make you trip. What can I do with my stairs?  Do not leave any items on the stairs.  Make sure that there are handrails on both sides of the stairs and use them. Fix handrails that are broken or loose. Make sure that handrails are as long as the stairways.  Check any carpeting to make sure that it is firmly attached to the stairs. Fix any carpet that is loose or worn.  Avoid having throw rugs at the top or bottom of the stairs. If you do have throw rugs, attach them to the floor with carpet  tape.  Make sure that you have a light switch at the top of the stairs and the bottom of the stairs. If you do not have them, ask someone to add them for you. What else can I do to help prevent falls?  Wear shoes that:  Do not have high heels.  Have rubber bottoms.  Are comfortable and fit you well.  Are closed at the toe. Do not wear sandals.  If you use a stepladder:  Make sure that it is fully opened. Do not climb a closed stepladder.  Make sure that both sides of the stepladder are locked into place.  Ask someone to hold it for you, if possible.  Clearly mark and make sure that you can see:  Any grab bars or handrails.  First and last steps.  Where the edge of each step is.  Use tools that help you move around (mobility aids) if they are needed. These include:  Canes.  Walkers.  Scooters.  Crutches.  Turn on the lights when you go into a dark area. Replace any light bulbs as soon as they burn out.  Set up your furniture so you have a clear path. Avoid moving your furniture around.  If any of your floors are uneven, fix them.  If there are any pets around you, be aware of where they are.  Review your medicines with your doctor. Some medicines can make you feel dizzy. This can increase your chance of falling. Ask your doctor what other things that you can do to help prevent falls. This information is not intended to replace advice given to you by your health care provider. Make sure you discuss any questions you have with your health care provider. Document Released: 11/03/2008 Document Revised: 06/15/2015 Document Reviewed: 02/11/2014 Elsevier Interactive Patient Education  2017 [employer].

## 2019-09-28 DIAGNOSIS — J3089 Other allergic rhinitis: Secondary | ICD-10-CM | POA: Diagnosis not present

## 2019-09-28 DIAGNOSIS — J301 Allergic rhinitis due to pollen: Secondary | ICD-10-CM | POA: Diagnosis not present

## 2019-09-29 DIAGNOSIS — H353131 Nonexudative age-related macular degeneration, bilateral, early dry stage: Secondary | ICD-10-CM | POA: Diagnosis not present

## 2019-10-05 DIAGNOSIS — J301 Allergic rhinitis due to pollen: Secondary | ICD-10-CM | POA: Diagnosis not present

## 2019-10-05 DIAGNOSIS — J3089 Other allergic rhinitis: Secondary | ICD-10-CM | POA: Diagnosis not present

## 2019-10-08 ENCOUNTER — Other Ambulatory Visit: Payer: Self-pay | Admitting: Internal Medicine

## 2019-10-12 DIAGNOSIS — J3089 Other allergic rhinitis: Secondary | ICD-10-CM | POA: Diagnosis not present

## 2019-10-12 DIAGNOSIS — J301 Allergic rhinitis due to pollen: Secondary | ICD-10-CM | POA: Diagnosis not present

## 2019-10-19 DIAGNOSIS — J3089 Other allergic rhinitis: Secondary | ICD-10-CM | POA: Diagnosis not present

## 2019-10-19 DIAGNOSIS — J301 Allergic rhinitis due to pollen: Secondary | ICD-10-CM | POA: Diagnosis not present

## 2019-10-21 ENCOUNTER — Encounter: Payer: Self-pay | Admitting: Cardiovascular Disease

## 2019-10-21 ENCOUNTER — Ambulatory Visit (INDEPENDENT_AMBULATORY_CARE_PROVIDER_SITE_OTHER): Payer: PPO | Admitting: Cardiovascular Disease

## 2019-10-21 ENCOUNTER — Other Ambulatory Visit: Payer: Self-pay

## 2019-10-21 VITALS — BP 110/80 | HR 92 | Ht 70.0 in | Wt 198.1 lb

## 2019-10-21 DIAGNOSIS — I480 Paroxysmal atrial fibrillation: Secondary | ICD-10-CM | POA: Diagnosis not present

## 2019-10-21 DIAGNOSIS — E782 Mixed hyperlipidemia: Secondary | ICD-10-CM

## 2019-10-21 DIAGNOSIS — I10 Essential (primary) hypertension: Secondary | ICD-10-CM | POA: Diagnosis not present

## 2019-10-21 NOTE — Progress Notes (Signed)
Cardiology Office Note   Date:  10/21/2019   ID:  Todd Golden, Todd Golden 1930/03/08, MRN [id number]  PCP:  Crecencio Mc, MD  Cardiologist:   Kathlyn Sacramento, MD   Chief Complaint  Patient presents with  . other    12 month follow up. Meds reviewed by the pt. verbally. "doing well."       History of Present Illness: Todd Golden is a 86 y.o. male who presents for a follow-up visit regarding paroxysmal atrial fibrillation.  Other medical problems include previous stroke, hypertension, hyperlipidemia, and glaucoma.  In November 2015, he was admitted with expressive aphasia and mild ataxia due to left MCA stroke  in the setting of atrial fibrillation.  Echocardiogram showed normal LV function well carotid Doppler showed no obstructive disease.  He was placed on Eliquis. He has been doing reasonably well overall.  He was seen last year and was noted in sinus rhythm.  He is noted to be in atrial fibrillation today but he is asymptomatic.  He moved out of [facility] with his wife and currently they live at the house and [place].  He is happier there and has been busy working on his garden.  He is able to do all activities of daily living without significant limitations.  He denies any palpitations.   Past Medical History:  Diagnosis Date  . Allergy   . Arthritis   . Asthma   . Cataract   . Chronic constipation   . Colon polyps   . Cough   . GERD (gastroesophageal reflux disease)   . Glaucoma   . HOH (hard of hearing)    a. Uses hearing aids  . Hyperlipidemia   . Hypertension   . Macular degeneration   . Nephrolithiasis    stent  . PAF (paroxysmal atrial fibrillation) ([place])    a. 11/2013 Dx in setting of L MCA stroke. CHA2DS2VASc = 5-->Eliquis; b. 11/2013 Echo: Nl LV fxn. Mildly dil LA.  . Stroke ([place])    a. 11/2013 L MCA stroke.  Marland Kitchen Ureteral stricture     Past Surgical History:  Procedure Laterality Date  . CATARACT EXTRACTION W/PHACO Left 12/20/2014   Procedure:  CATARACT EXTRACTION PHACO AND INTRAOCULAR LENS PLACEMENT (IOC);  Surgeon: Birder Robson, MD;  Location: [facility] ORS;  Service: Ophthalmology;  Laterality: Left;   [place]     00:59.6 AP%    21.2 CDE    12.66 fluid pack lot #[id number]  . JOINT REPLACEMENT Left 2014   knee partial replacement  . kidney stent Right 1995     Current Outpatient Medications  Medication Sig Dispense Refill  . amLODipine (NORVASC) 5 MG tablet Take 1 tablet (5 mg total) by mouth daily. 90 tablet 1  . doxazosin (CARDURA) 4 MG tablet TAKE 1 TABLET BY MOUTH DAILY 90 tablet 0  . ELIQUIS 5 MG TABS tablet TAKE 1 TABLET(5 MG) BY MOUTH TWICE DAILY 60 tablet 6  . EPINEPHrine 0.3 mg/0.3 mL IJ SOAJ injection Inject 0.3 mg into the muscle as needed for anaphylaxis.    . hydrochlorothiazide (HYDRODIURIL) 25 MG tablet Take 1 tablet (25 mg total) by mouth daily. 90 tablet 1  . latanoprost (XALATAN) 0.005 % ophthalmic solution Place 1 drop into both eyes at bedtime.    . Multiple Vitamins-Minerals (PRESERVISION AREDS 2 PO) Take by mouth daily.    Marland Kitchen omeprazole (PRILOSEC) 40 MG capsule TAKE 1 CAPSULE(40 MG) BY MOUTH DAILY 90 capsule 3  . pindolol (VISKEN) 10  MG tablet Take 1 tablet (10 mg total) by mouth daily. 90 tablet 0  . simvastatin (ZOCOR) 20 MG tablet TAKE 1 TABLET BY MOUTH ONCE DAILY 90 tablet 1   No current facility-administered medications for this visit.    Allergies:   Patient has no known allergies.    Social History:  The patient  reports that he has never smoked. He has never used smokeless tobacco. He reports current alcohol use. He reports that he does not use drugs.   Family History:  The patient's family history includes Arthritis in his mother; Cancer (age of onset: 31) in his mother; Stroke in his father.    ROS:  Please see the history of present illness.   Otherwise, review of systems are positive for none.   All other systems are reviewed and negative.    PHYSICAL EXAM: VS:  BP 110/80 (BP Location: Left  Arm, Patient Position: Sitting, Cuff Size: Normal)   Pulse 92   Ht 5\' 10"  (1.778 m)   Wt 198 lb 2 oz (89.9 kg)   SpO2 97%   BMI 28.43 kg/m  , BMI Body mass index is 28.43 kg/m. GEN: Well nourished, well developed, in no acute distress  HEENT: normal  Neck: no JVD, carotid bruits, or masses Cardiac: Irregularly irregular; no murmurs, rubs, or gallops,no edema  Respiratory:  clear to auscultation bilaterally, normal work of breathing GI: soft, nontender, nondistended, + BS MS: no deformity or atrophy  Skin: warm and dry, no rash Neuro:  Strength and sensation are intact Psych: euthymic mood, full affect   EKG:  EKG is ordered today. The ekg ordered today demonstrates atrial fibrillation with ventricular rate of 92 bpm.  Nonspecific T wave changes, borderline LVH.   Recent Labs: 05/27/2019: ALT 21; BUN 20; Creatinine, Ser 1.12; Hemoglobin 13.1; Platelets 207.0; Potassium 3.9; Sodium 137    Lipid Panel    Component Value Date/Time   CHOL 134 08/19/2018 1513   CHOL 145 07/04/2016 1001   CHOL 168 12/04/2013 0549   TRIG 197.0 (H) 08/19/2018 1513   TRIG 96 12/04/2013 0549   HDL 44.20 08/19/2018 1513   HDL 46 07/04/2016 1001   HDL 44 12/04/2013 0549   CHOLHDL 3 08/19/2018 1513   VLDL 39.4 08/19/2018 1513   VLDL 19 12/04/2013 0549   LDLCALC 51 08/19/2018 1513   LDLCALC 71 07/04/2016 1001   LDLCALC 105 (H) 12/04/2013 0549      Wt Readings from Last 3 Encounters:  10/21/19 198 lb 2 oz (89.9 kg)  09/23/19 196 lb (88.9 kg)  05/27/19 196 lb (88.9 kg)        No flowsheet data found.    ASSESSMENT AND PLAN:  1.  Paroxysmal atrial fibrillation: This was documented in 2015 in the setting of left MCA stroke.  He was in sinus rhythm after that but today he is noted to be in atrial fibrillation.  Ventricular rate is controlled without medications.  He is completely asymptomatic and thus I do not think we need to pursue rhythm control strategy.  Continue anticoagulation with  Eliquis 5 mg twice daily.  I reviewed his most recent labs in May.  If ventricular rate starts increasing, we can consider switching amlodipine to diltiazem.  2.  Essential hypertension: Blood pressure is controlled on current medications.    3.  Hyperlipidemia: He is on simvastatin.  Most recent lipid profile showed an LDL of 51.   Disposition:   FU with me in 6 months.  Signed,  Kathlyn Sacramento, MD  10/21/2019 8:31 AM    [place]

## 2019-10-21 NOTE — Patient Instructions (Addendum)

## 2019-10-26 DIAGNOSIS — J301 Allergic rhinitis due to pollen: Secondary | ICD-10-CM | POA: Diagnosis not present

## 2019-10-26 DIAGNOSIS — J3089 Other allergic rhinitis: Secondary | ICD-10-CM | POA: Diagnosis not present

## 2019-11-02 DIAGNOSIS — J3081 Allergic rhinitis due to animal (cat) (dog) hair and dander: Secondary | ICD-10-CM | POA: Diagnosis not present

## 2019-11-02 DIAGNOSIS — J301 Allergic rhinitis due to pollen: Secondary | ICD-10-CM | POA: Diagnosis not present

## 2019-11-02 DIAGNOSIS — J3089 Other allergic rhinitis: Secondary | ICD-10-CM | POA: Diagnosis not present

## 2019-11-09 DIAGNOSIS — J3089 Other allergic rhinitis: Secondary | ICD-10-CM | POA: Diagnosis not present

## 2019-11-09 DIAGNOSIS — J301 Allergic rhinitis due to pollen: Secondary | ICD-10-CM | POA: Diagnosis not present

## 2019-11-16 DIAGNOSIS — J3089 Other allergic rhinitis: Secondary | ICD-10-CM | POA: Diagnosis not present

## 2019-11-16 DIAGNOSIS — J301 Allergic rhinitis due to pollen: Secondary | ICD-10-CM | POA: Diagnosis not present

## 2019-11-19 ENCOUNTER — Other Ambulatory Visit: Payer: Self-pay | Admitting: Internal Medicine

## 2019-11-30 DIAGNOSIS — J301 Allergic rhinitis due to pollen: Secondary | ICD-10-CM | POA: Diagnosis not present

## 2019-11-30 DIAGNOSIS — J3089 Other allergic rhinitis: Secondary | ICD-10-CM | POA: Diagnosis not present

## 2019-11-30 DIAGNOSIS — J3081 Allergic rhinitis due to animal (cat) (dog) hair and dander: Secondary | ICD-10-CM | POA: Diagnosis not present

## 2019-12-01 ENCOUNTER — Ambulatory Visit (INDEPENDENT_AMBULATORY_CARE_PROVIDER_SITE_OTHER): Payer: PPO | Admitting: Internal Medicine

## 2019-12-01 ENCOUNTER — Other Ambulatory Visit: Payer: Self-pay

## 2019-12-01 ENCOUNTER — Ambulatory Visit (INDEPENDENT_AMBULATORY_CARE_PROVIDER_SITE_OTHER): Payer: PPO

## 2019-12-01 ENCOUNTER — Encounter: Payer: Self-pay | Admitting: Internal Medicine

## 2019-12-01 VITALS — BP 120/72 | HR 106 | Temp 97.7°F | Resp 15 | Ht 70.0 in | Wt 198.8 lb

## 2019-12-01 DIAGNOSIS — R200 Anesthesia of skin: Secondary | ICD-10-CM | POA: Diagnosis not present

## 2019-12-01 DIAGNOSIS — E785 Hyperlipidemia, unspecified: Secondary | ICD-10-CM | POA: Diagnosis not present

## 2019-12-01 DIAGNOSIS — R7303 Prediabetes: Secondary | ICD-10-CM | POA: Diagnosis not present

## 2019-12-01 DIAGNOSIS — R2689 Other abnormalities of gait and mobility: Secondary | ICD-10-CM | POA: Diagnosis not present

## 2019-12-01 DIAGNOSIS — M542 Cervicalgia: Secondary | ICD-10-CM | POA: Diagnosis not present

## 2019-12-01 DIAGNOSIS — D6869 Other thrombophilia: Secondary | ICD-10-CM | POA: Diagnosis not present

## 2019-12-01 DIAGNOSIS — I10 Essential (primary) hypertension: Secondary | ICD-10-CM

## 2019-12-01 DIAGNOSIS — R202 Paresthesia of skin: Secondary | ICD-10-CM | POA: Diagnosis not present

## 2019-12-01 DIAGNOSIS — M4722 Other spondylosis with radiculopathy, cervical region: Secondary | ICD-10-CM | POA: Diagnosis not present

## 2019-12-01 LAB — COMPREHENSIVE METABOLIC PANEL
ALT: 21 U/L (ref 0–53)
AST: 22 U/L (ref 0–37)
Albumin: 4.1 g/dL (ref 3.5–5.2)
Alkaline Phosphatase: 54 U/L (ref 39–117)
BUN: 18 mg/dL (ref 6–23)
CO2: 32 mEq/L (ref 19–32)
Calcium: 9.3 mg/dL (ref 8.4–10.5)
Chloride: 100 mEq/L (ref 96–112)
Creatinine, Ser: 1.11 mg/dL (ref 0.40–1.50)
GFR: 59.75 mL/min — ABNORMAL LOW (ref >60.00)
Glucose, Bld: 108 mg/dL — ABNORMAL HIGH (ref 70–99)
Potassium: 4.1 mEq/L (ref 3.5–5.1)
Sodium: 139 mEq/L (ref 135–145)
Total Bilirubin: 0.8 mg/dL (ref 0.2–1.2)
Total Protein: 6.8 g/dL (ref 6.0–8.3)

## 2019-12-01 LAB — CBC WITH DIFFERENTIAL/PLATELET
Basophils Absolute: 0 10*3/uL (ref 0.0–0.1)
Basophils Relative: 0.4 % (ref 0.0–3.0)
Eosinophils Absolute: 0.2 10*3/uL (ref 0.0–0.7)
Eosinophils Relative: 3.9 % (ref 0.0–5.0)
HCT: 41.6 % (ref 39.0–52.0)
Hemoglobin: 13.8 g/dL (ref 13.0–17.0)
Lymphocytes Relative: 26 % (ref 12.0–46.0)
Lymphs Abs: 1.4 10*3/uL (ref 0.7–4.0)
MCHC: 33.3 g/dL (ref 30.0–36.0)
MCV: 92.8 fl (ref 78.0–100.0)
Monocytes Absolute: 0.7 10*3/uL (ref 0.1–1.0)
Monocytes Relative: 13 % — ABNORMAL HIGH (ref 3.0–12.0)
Neutro Abs: 3.1 10*3/uL (ref 1.4–7.7)
Neutrophils Relative %: 56.7 % (ref 43.0–77.0)
Platelets: 228 10*3/uL (ref 150.0–400.0)
RBC: 4.48 Mil/uL (ref 4.22–5.81)
RDW: 13.6 % (ref 11.5–15.5)
WBC: 5.5 10*3/uL (ref 4.0–10.5)

## 2019-12-01 LAB — HEMOGLOBIN A1C: Hgb A1c MFr Bld: 6.3 % (ref 4.6–6.5)

## 2019-12-01 LAB — LIPID PANEL
Cholesterol: 131 mg/dL (ref 0–200)
HDL: 50.9 mg/dL (ref >39.00)
LDL Cholesterol: 62 mg/dL (ref 0–99)
NonHDL: 80.19
Total CHOL/HDL Ratio: 3
Triglycerides: 89 mg/dL (ref 0.0–149.0)
VLDL: 17.8 mg/dL (ref 0.0–40.0)

## 2019-12-01 LAB — VITAMIN B12: Vitamin B-12: 360 pg/mL (ref 211–911)

## 2019-12-01 LAB — TSH: TSH: 1.49 u[IU]/mL (ref 0.35–4.50)

## 2019-12-01 MED ORDER — ZOSTER VAC RECOMB ADJUVANTED 50 MCG/0.5ML IM SUSR: 0.5000 mL | Freq: Once | INTRAMUSCULAR | 1 refills | Status: AC

## 2019-12-01 NOTE — Progress Notes (Signed)
Your plain films of the neck suggested that  degenerative changes at multiple levels have caused bone spurs that may be pushing on nerve roots as they come out from the spinal cord.  This can cause pain that radiates to either arm, or to the scalp and cause headaches. An MRI would provide more detailed information .  I recommend that we wait until you see the neurologist before ordering an MRI  Regards,  Dr. Derrel Nip

## 2019-12-01 NOTE — Progress Notes (Signed)
Subjective:  Patient ID: Todd Golden, male    DOB: [birth year]  Age: 86 y.o. MRN: [id number]  CC: The primary encounter diagnosis was Cervical radiculopathy due to degenerative joint disease of spine. Diagnoses of Numbness and tingling in left arm, Acquired thrombophilia ([place]), Hyperlipidemia LDL goal <100, Prediabetes, Left arm numbness, Essential hypertension, benign, and Balance problem were also pertinent to this visit.  HPI Todd Golden presents for 6 month follow up  This visit occurred during the SARS-CoV-2 public health emergency.  Safety protocols were in place, including screening questions prior to the visit, additional usage of staff PPE, and extensive cleaning of exam room while observing appropriate contact time as indicated for disinfecting solutions.    Patient has received both doses of the available COVID 19 vaccine without complications.  Patient continues to mask when outside of the home except when walking in yard or at safe distances from others .  Patient denies any change in mood or development of unhealthy behaviors resuting from the pandemic's restriction of activities and socialization.    Cc:  Balance problems,  Left hand weakness  History of CVA , left MCA embolic in 3474 with Broca's aphasia now improved but not completely resolved.   He reports  Bilateral hand numbness and pain.  First mentioned in 2015.  Neurology referral was recommended but deferred.  No prior workup.  Left hand getting worse  Right hand numbness and the painful sensations involving the left hand finger tips has improved with product called "Nervyne" for  t he last 10 days.  Notes some dropping of cups if he uses only one hand. Can feel hot and cold.   History of a fall remotedly :hit head on refrigerator 1.5 years ago  Had a right sided facial bruise and black eye.  Did not go to ER   Acquired thrombophilia: denies  bleeding, notes easy bruising   Loss of balance :  Since stroke.   No recent falls.  deneis vertigo  Outpatient Medications Prior to Visit  Medication Sig Dispense Refill  . amLODipine (NORVASC) 5 MG tablet Take 1 tablet (5 mg total) by mouth daily. 90 tablet 1  . doxazosin (CARDURA) 4 MG tablet TAKE 1 TABLET BY MOUTH DAILY 90 tablet 0  . ELIQUIS 5 MG TABS tablet TAKE 1 TABLET(5 MG) BY MOUTH TWICE DAILY 60 tablet 6  . EPINEPHrine 0.3 mg/0.3 mL IJ SOAJ injection Inject 0.3 mg into the muscle as needed for anaphylaxis.    . hydrochlorothiazide (HYDRODIURIL) 25 MG tablet TAKE 1 TABLET(25 MG) BY MOUTH DAILY 90 tablet 1  . latanoprost (XALATAN) 0.005 % ophthalmic solution Place 1 drop into both eyes at bedtime.    . Multiple Vitamins-Minerals (PRESERVISION AREDS 2 PO) Take by mouth daily.    Marland Kitchen omeprazole (PRILOSEC) 40 MG capsule TAKE 1 CAPSULE(40 MG) BY MOUTH DAILY 90 capsule 3  . pindolol (VISKEN) 10 MG tablet Take 1 tablet (10 mg total) by mouth daily. 90 tablet 0  . simvastatin (ZOCOR) 20 MG tablet TAKE 1 TABLET BY MOUTH ONCE DAILY 90 tablet 1   No facility-administered medications prior to visit.    Review of Systems;  Patient denies headache, fevers, malaise, unintentional weight loss, skin rash, eye pain, sinus congestion and sinus pain, sore throat, dysphagia,  hemoptysis , cough, dyspnea, wheezing, chest pain, palpitations, orthopnea, edema, abdominal pain, nausea, melena, diarrhea, constipation, flank pain, dysuria, hematuria, urinary  Frequency, nocturia, numbness, tingling, seizures,  Focal weakness, Loss of consciousness,  Tremor, insomnia, depression, anxiety, and suicidal ideation.      Objective:  BP 120/72 (BP Location: Left Arm, Patient Position: Sitting, Cuff Size: Normal)   Pulse (!) 106   Temp 97.7 F (36.5 C) (Oral)   Resp 15   Ht 5\' 10"  (1.778 m)   Wt 198 lb 12.8 oz (90.2 kg)   SpO2 97%   BMI 28.52 kg/m   BP Readings from Last 3 Encounters:  12/01/19 120/72  10/21/19 110/80  05/27/19 132/80    Wt Readings from Last 3  Encounters:  12/01/19 198 lb 12.8 oz (90.2 kg)  10/21/19 198 lb 2 oz (89.9 kg)  09/23/19 196 lb (88.9 kg)    General appearance: alert, cooperative and appears stated age Ears: normal TM's and external ear canals both ears Throat: lips, mucosa, and tongue normal; teeth and gums normal Neck: no adenopathy, no carotid bruit, supple, symmetrical, trachea midline and thyroid not enlarged, symmetric, no tenderness/mass/nodules Back: symmetric, no curvature. ROM normal. No CVA tenderness. Lungs: clear to auscultation bilaterally Heart: regular rate and rhythm, S1, S2 normal, no murmur, click, rub or gallop Abdomen: soft, non-tender; bowel sounds normal; no masses,  no organomegaly Pulses: 2+ and symmetric Skin: Skin color, texture, turgor normal. No rashes or lesions Lymph nodes: Cervical, supraclavicular, and axillary nodes normal. Neuro: CNs 2-12 intact. Speech articulate but some occasional word finding difficult. . Muscle strength 5/5 in upper and lower exremities. Fine resting tremor bilaterally both hands cerebellar function normal. Romberg negative.  No pronator drift.   Gait normal. Loss of sensation noted in left hand all 5 fingers.   Lab Results  Component Value Date   HGBA1C 6.3 12/01/2019   HGBA1C 6.0 05/27/2019   HGBA1C 6.1 08/19/2018    Lab Results  Component Value Date   CREATININE 1.11 12/01/2019   CREATININE 1.12 05/27/2019   CREATININE 1.15 08/19/2018    Lab Results  Component Value Date   WBC 5.5 12/01/2019   HGB 13.8 12/01/2019   HCT 41.6 12/01/2019   PLT 228.0 12/01/2019   GLUCOSE 108 (H) 12/01/2019   CHOL 131 12/01/2019   TRIG 89.0 12/01/2019   HDL 50.90 12/01/2019   LDLCALC 62 12/01/2019   ALT 21 12/01/2019   AST 22 12/01/2019   NA 139 12/01/2019   K 4.1 12/01/2019   CL 100 12/01/2019   CREATININE 1.11 12/01/2019   BUN 18 12/01/2019   CO2 32 12/01/2019   TSH 1.49 12/01/2019   PSA 2.97 10/21/2012   HGBA1C 6.3 12/01/2019    No results  found.  Assessment & Plan:   Problem List Items Addressed This Visit      Unprioritized   Acquired thrombophilia ([place])    secondary to use of Eliquis for embolic stroke risk mitigation due to  atrial fibrillation. Patient has no signs of bleeding and is advised to notify his specialists prior to any procedure that may required suspension of Eliquis .  REviewed historical fall and advised that any blunt head trauma should result in a trip to the office for neurology evaluation and imaging       Relevant Orders   CBC with Differential/Platelet (Completed)   Balance problem    Likely related to prior stroke .  Screening labs are normal. Referral to neurology for nerve conduction studies   Lab Results  Component Value Date   VITAMINB12 360 12/01/2019   Lab Results  Component Value Date   FOLATE >23.7 05/27/2019   Lab Results  Component Value  Date   HGBA1C 6.3 12/01/2019   Lab Results  Component Value Date   WBC 5.5 12/01/2019   HGB 13.8 12/01/2019   HCT 41.6 12/01/2019   MCV 92.8 12/01/2019   PLT 228.0 12/01/2019         Essential hypertension, benign    Well controlled on current regimen. Renal function stable, no changes today.  Lab Results  Component Value Date   CREATININE 1.11 12/01/2019   Lab Results  Component Value Date   NA 139 12/01/2019   K 4.1 12/01/2019   CL 100 12/01/2019   CO2 32 12/01/2019         Hyperlipidemia LDL goal <100   Relevant Orders   Lipid panel (Completed)   Left arm numbness    Now with numbness to microfilament test I all 5 fingers.  cerivcal spine films were abnormal suggesting multilevel degenerative changes with foraminal encroachment. ,  Screening labs normal.  MRI cervical spine  and neurology referra l  For nerve conduction studies needed .      Prediabetes   Relevant Orders   Hemoglobin A1c (Completed)   Comprehensive metabolic panel (Completed)    Other Visit Diagnoses    Cervical radiculopathy due to degenerative  joint disease of spine    -  Primary   Numbness and tingling in left arm       Relevant Orders   DG Cervical Spine Complete (Completed)   Ambulatory referral to Neurology   TSH (Completed)   Vitamin B12 (Completed)   RBC Folate     I provided  30 minutes of  face-to-face time during this encounter reviewing patient's current problems and past surgeries, labs and imaging studies, providing counseling on the above mentioned problems , and coordination  of care .  I am having [name] start on Zoster Vaccine Adjuvanted. I am also having him maintain his latanoprost, Multiple Vitamins-Minerals (PRESERVISION AREDS 2 PO), EPINEPHrine, amLODipine, doxazosin, pindolol, Eliquis, omeprazole, and hydrochlorothiazide.  Meds ordered this encounter  Medications  . Zoster Vaccine Adjuvanted [facility]) injection    Sig: Inject 0.5 mLs into the muscle once for 1 dose.    Dispense:  1 each    Refill:  1    There are no discontinued medications.  Follow-up: No follow-ups on file.   Crecencio Mc, MD

## 2019-12-01 NOTE — Patient Instructions (Signed)
Left arm numbness could be from a disk problem in your neck or due to Carpal Tunnel Syndrome   Xrays and labs today;  Referring to Dr Manuella Ghazi at [facility] for nerve conduction studies to rule out CTS  (see below)    Carpal Tunnel Syndrome  Carpal tunnel syndrome is a condition that causes pain in your hand and arm. The carpal tunnel is a narrow area that is on the palm side of your wrist. Repeated wrist motion or certain diseases may cause swelling in the tunnel. This swelling can pinch the main nerve in the wrist (median nerve). What are the causes? This condition may be caused by:  Repeated wrist motions.  Wrist injuries.  Arthritis.  A sac of fluid (cyst) or abnormal growth (tumor) in the carpal tunnel.  Fluid buildup during pregnancy. Sometimes the cause is not known. What increases the risk? The following factors may make you more likely to develop this condition:  Having a job in which you move your wrist in the same way many times. This includes jobs like being a Software engineer or a Scientist, water quality.  Being a woman.  Having other health conditions, such as: ? Diabetes. ? Obesity. ? A thyroid gland that is not active enough (hypothyroidism). ? Kidney failure. What are the signs or symptoms? Symptoms of this condition include:  A tingling feeling in your fingers.  Tingling or a loss of feeling (numbness) in your hand.  Pain in your entire arm. This pain may get worse when you bend your wrist and elbow for a long time.  Pain in your wrist that goes up your arm to your shoulder.  Pain that goes down into your palm or fingers.  A weak feeling in your hands. You may find it hard to grab and hold items. You may feel worse at night. How is this diagnosed? This condition is diagnosed with a medical history and physical exam. You may also have tests, such as:  Electromyogram (EMG). This test checks the signals that the nerves send to the muscles.  Nerve conduction study. This  test checks how well signals pass through your nerves.  Imaging tests, such as X-rays, ultrasound, and MRI. These tests check for what might be the cause of your condition. How is this treated? This condition may be treated with:  Lifestyle changes. You will be asked to stop or change the activity that caused your problem.  Doing exercise and activities that make bones and muscles stronger (physical therapy).  Learning how to use your hand again (occupational therapy).  Medicines for pain and swelling (inflammation). You may have injections in your wrist.  A wrist splint.  Surgery. Follow these instructions at home: If you have a splint:  Wear the splint as told by your doctor. Remove it only as told by your doctor.  Loosen the splint if your fingers: ? Tingle. ? Lose feeling (become numb). ? Turn cold and blue.  Keep the splint clean.  If the splint is not waterproof: ? Do not let it get wet. ? Cover it with a watertight covering when you take a bath or a shower. Managing pain, stiffness, and swelling   If told, put ice on the painful area: ? If you have a removable splint, remove it as told by your doctor. ? Put ice in a plastic bag. ? Place a towel between your skin and the bag. ? Leave the ice on for 20 minutes, 2-3 times per day. General instructions  Take  over-the-counter and prescription medicines only as told by your doctor.  Rest your wrist from any activity that may cause pain. If needed, talk with your boss at work about changes that can help your wrist heal.  Do any exercises as told by your doctor, physical therapist, or occupational therapist.  Keep all follow-up visits as told by your doctor. This is important. Contact a doctor if:  You have new symptoms.  Medicine does not help your pain.  Your symptoms get worse. Get help right away if:  You have very bad numbness or tingling in your wrist or hand. Summary  Carpal tunnel syndrome is a  condition that causes pain in your hand and arm.  It is often caused by repeated wrist motions.  Lifestyle changes and medicines are used to treat this problem. Surgery may help in very bad cases.  Follow your doctor's instructions about wearing a splint, resting your wrist, keeping follow-up visits, and calling for help. This information is not intended to replace advice given to you by your health care provider. Make sure you discuss any questions you have with your health care provider. Document Revised: 05/16/2017 Document Reviewed: 05/16/2017 Elsevier Patient Education  [place].

## 2019-12-01 NOTE — Assessment & Plan Note (Addendum)
Now with numbness to microfilament test I all 5 fingers.  cerivcal spine films were abnormal suggesting multilevel degenerative changes with foraminal encroachment. ,  Screening labs normal.  MRI cervical spine  and neurology referra l  For nerve conduction studies needed .

## 2019-12-03 ENCOUNTER — Other Ambulatory Visit: Payer: Self-pay | Admitting: Internal Medicine

## 2019-12-03 DIAGNOSIS — D6869 Other thrombophilia: Secondary | ICD-10-CM | POA: Insufficient documentation

## 2019-12-03 DIAGNOSIS — R2689 Other abnormalities of gait and mobility: Secondary | ICD-10-CM | POA: Insufficient documentation

## 2019-12-03 NOTE — Assessment & Plan Note (Signed)
Well controlled on current regimen. Renal function stable, no changes today.  Lab Results  Component Value Date   CREATININE 1.11 12/01/2019   Lab Results  Component Value Date   NA 139 12/01/2019   K 4.1 12/01/2019   CL 100 12/01/2019   CO2 32 12/01/2019

## 2019-12-03 NOTE — Progress Notes (Signed)
Labs normal .  Still prediabetic,  so careful on the sugar and try to maintain a daily walk  for exercise .  Goal 30 minutes

## 2019-12-03 NOTE — Assessment & Plan Note (Signed)
Likely related to prior stroke .  Screening labs are normal. Referral to neurology for nerve conduction studies   Lab Results  Component Value Date   VITAMINB12 360 12/01/2019   Lab Results  Component Value Date   FOLATE >23.7 05/27/2019   Lab Results  Component Value Date   HGBA1C 6.3 12/01/2019   Lab Results  Component Value Date   WBC 5.5 12/01/2019   HGB 13.8 12/01/2019   HCT 41.6 12/01/2019   MCV 92.8 12/01/2019   PLT 228.0 12/01/2019

## 2019-12-03 NOTE — Assessment & Plan Note (Signed)
secondary to use of Eliquis for embolic stroke risk mitigation due to  atrial fibrillation. Patient has no signs of bleeding and is advised to notify his specialists prior to any procedure that may required suspension of Eliquis .  REviewed historical fall and advised that any blunt head trauma should result in a trip to the office for neurology evaluation and imaging

## 2019-12-07 DIAGNOSIS — J301 Allergic rhinitis due to pollen: Secondary | ICD-10-CM | POA: Diagnosis not present

## 2019-12-07 DIAGNOSIS — J3081 Allergic rhinitis due to animal (cat) (dog) hair and dander: Secondary | ICD-10-CM | POA: Diagnosis not present

## 2019-12-07 DIAGNOSIS — J3089 Other allergic rhinitis: Secondary | ICD-10-CM | POA: Diagnosis not present

## 2019-12-09 ENCOUNTER — Ambulatory Visit
Admission: RE | Admit: 2019-12-09 | Discharge: 2019-12-09 | Disposition: A | Discharge status: Home / Self Care | Payer: PPO | Source: Ambulatory Visit | Attending: Internal Medicine | Admitting: Internal Medicine

## 2019-12-09 ENCOUNTER — Encounter: Payer: Self-pay | Admitting: Internal Medicine

## 2019-12-09 ENCOUNTER — Telehealth: Payer: Self-pay

## 2019-12-09 ENCOUNTER — Other Ambulatory Visit: Payer: Self-pay

## 2019-12-09 DIAGNOSIS — I639 Cerebral infarction, unspecified: Secondary | ICD-10-CM | POA: Diagnosis not present

## 2019-12-09 DIAGNOSIS — G319 Degenerative disease of nervous system, unspecified: Secondary | ICD-10-CM | POA: Insufficient documentation

## 2019-12-09 DIAGNOSIS — S098XXS Other specified injuries of head, sequela: Secondary | ICD-10-CM | POA: Diagnosis not present

## 2019-12-09 DIAGNOSIS — R609 Edema, unspecified: Secondary | ICD-10-CM | POA: Diagnosis not present

## 2019-12-09 DIAGNOSIS — S0990XA Unspecified injury of head, initial encounter: Secondary | ICD-10-CM | POA: Diagnosis not present

## 2019-12-09 NOTE — Progress Notes (Signed)
Head CT done:  no bleed,  but progression of atrophic changes   were seen . Stay off the eliqiuis,  because as the brain atrophies,  the balance gets worse.  Is he willing to have PT for balance training?

## 2019-12-09 NOTE — Telephone Encounter (Signed)
I have ordered a stat head CT to rule out bleed.  I am recommending that he discontinue Eliquis since he has had recurrent falls

## 2019-12-09 NOTE — Telephone Encounter (Signed)
Received a mychart message from pt's wife and she stated that the pt saw you on 12/01/2019 for his physical that evening he had another fall and hit his head. I tried contacting pt and wife on several occasions with no success. I messaged them back asking if pt was having any new symptoms since the last fall and she stated that pt has had difficulty finding his words and understanding him when he speaks. Since unable to reach pt or wife I called pt's daughter, Hoyle Sauer, she is aware of the fall and stated that she tried to get him to go to the ED immediately due to his history of stroke and that he is taking Eliquis. She stated that they both refused because they are afraid of getting covid if they go there. Daughter was wanting to know if there was something that could be done to get him checked out that he and his wife would be comfortable with.

## 2019-12-09 NOTE — Telephone Encounter (Signed)
Spoke with pt's wife to let her know that a head CT scan has been ordered for pt and that the pt should stop taking the Eliquis because of his recurrent falls. Wife gave a verbal understanding.   LMTCB with pt's daughter so we could make her aware of the plan.

## 2019-12-10 ENCOUNTER — Other Ambulatory Visit: Payer: Self-pay | Admitting: Internal Medicine

## 2019-12-10 DIAGNOSIS — R296 Repeated falls: Secondary | ICD-10-CM

## 2019-12-10 NOTE — Progress Notes (Signed)
PT referral in progress °

## 2019-12-14 DIAGNOSIS — J301 Allergic rhinitis due to pollen: Secondary | ICD-10-CM | POA: Diagnosis not present

## 2019-12-14 DIAGNOSIS — J3089 Other allergic rhinitis: Secondary | ICD-10-CM | POA: Diagnosis not present

## 2019-12-20 ENCOUNTER — Telehealth: Payer: Self-pay | Admitting: Internal Medicine

## 2019-12-21 DIAGNOSIS — J3089 Other allergic rhinitis: Secondary | ICD-10-CM | POA: Diagnosis not present

## 2019-12-21 DIAGNOSIS — J3081 Allergic rhinitis due to animal (cat) (dog) hair and dander: Secondary | ICD-10-CM | POA: Diagnosis not present

## 2019-12-21 DIAGNOSIS — J301 Allergic rhinitis due to pollen: Secondary | ICD-10-CM | POA: Diagnosis not present

## 2019-12-22 DIAGNOSIS — H353211 Exudative age-related macular degeneration, right eye, with active choroidal neovascularization: Secondary | ICD-10-CM | POA: Diagnosis not present

## 2019-12-28 DIAGNOSIS — J3089 Other allergic rhinitis: Secondary | ICD-10-CM | POA: Diagnosis not present

## 2019-12-28 DIAGNOSIS — J301 Allergic rhinitis due to pollen: Secondary | ICD-10-CM | POA: Diagnosis not present

## 2020-01-04 DIAGNOSIS — J3089 Other allergic rhinitis: Secondary | ICD-10-CM | POA: Diagnosis not present

## 2020-01-04 DIAGNOSIS — J301 Allergic rhinitis due to pollen: Secondary | ICD-10-CM | POA: Diagnosis not present

## 2020-01-11 DIAGNOSIS — J3089 Other allergic rhinitis: Secondary | ICD-10-CM | POA: Diagnosis not present

## 2020-01-11 DIAGNOSIS — J301 Allergic rhinitis due to pollen: Secondary | ICD-10-CM | POA: Diagnosis not present

## 2020-01-18 DIAGNOSIS — J3089 Other allergic rhinitis: Secondary | ICD-10-CM | POA: Diagnosis not present

## 2020-01-18 DIAGNOSIS — J301 Allergic rhinitis due to pollen: Secondary | ICD-10-CM | POA: Diagnosis not present

## 2020-01-23 ENCOUNTER — Other Ambulatory Visit: Payer: Self-pay | Admitting: Cardiovascular Disease

## 2020-01-25 DIAGNOSIS — J301 Allergic rhinitis due to pollen: Secondary | ICD-10-CM | POA: Diagnosis not present

## 2020-01-25 DIAGNOSIS — J3089 Other allergic rhinitis: Secondary | ICD-10-CM | POA: Diagnosis not present

## 2020-01-28 ENCOUNTER — Other Ambulatory Visit: Payer: Self-pay | Admitting: Internal Medicine

## 2020-01-28 DIAGNOSIS — R4701 Aphasia: Secondary | ICD-10-CM

## 2020-01-28 DIAGNOSIS — R2689 Other abnormalities of gait and mobility: Secondary | ICD-10-CM

## 2020-01-28 DIAGNOSIS — Z9181 History of falling: Secondary | ICD-10-CM

## 2020-02-01 ENCOUNTER — Ambulatory Visit: Payer: PPO | Attending: Internal Medicine | Admitting: Speech Pathology

## 2020-02-01 ENCOUNTER — Other Ambulatory Visit: Payer: Self-pay

## 2020-02-01 DIAGNOSIS — R262 Difficulty in walking, not elsewhere classified: Secondary | ICD-10-CM | POA: Diagnosis not present

## 2020-02-01 DIAGNOSIS — G319 Degenerative disease of nervous system, unspecified: Secondary | ICD-10-CM | POA: Diagnosis not present

## 2020-02-01 DIAGNOSIS — M6281 Muscle weakness (generalized): Secondary | ICD-10-CM | POA: Diagnosis not present

## 2020-02-01 DIAGNOSIS — R4701 Aphasia: Secondary | ICD-10-CM | POA: Insufficient documentation

## 2020-02-01 DIAGNOSIS — I69923 Fluency disorder following unspecified cerebrovascular disease: Secondary | ICD-10-CM | POA: Insufficient documentation

## 2020-02-01 DIAGNOSIS — J3089 Other allergic rhinitis: Secondary | ICD-10-CM | POA: Diagnosis not present

## 2020-02-01 DIAGNOSIS — R471 Dysarthria and anarthria: Secondary | ICD-10-CM | POA: Diagnosis not present

## 2020-02-01 DIAGNOSIS — J301 Allergic rhinitis due to pollen: Secondary | ICD-10-CM | POA: Diagnosis not present

## 2020-02-01 DIAGNOSIS — I679 Cerebrovascular disease, unspecified: Secondary | ICD-10-CM | POA: Insufficient documentation

## 2020-02-01 NOTE — Therapy (Signed)
[place] MAIN [facility] SERVICES [address] [place], [place], [address] Phone: [phone]   Fax:  [phone]  Speech Language Pathology Evaluation  Patient Details  Name: Todd Golden MRN: [id number] Date of Birth: 09-24-1930 Referring Provider (SLP): Fara Olden   Encounter Date: 02/01/2020   End of Session - 02/01/20 1455    Visit Number 1    Number of Visits 25    Date for SLP Re-Evaluation 04/25/20    Authorization Type [employer]    Authorization Time Period 02/01/2020 thru 04/25/2020    Authorization - Visit Number 1    Progress Note Due on Visit 10    SLP Start Time 1330    SLP Stop Time  1430    SLP Time Calculation (min) 60 min    Activity Tolerance Patient tolerated treatment well           Past Medical History:  Diagnosis Date  . Allergy   . Arthritis   . Asthma   . Cataract   . Chronic constipation   . Colon polyps   . Cough   . GERD (gastroesophageal reflux disease)   . Glaucoma   . HOH (hard of hearing)    a. Uses hearing aids  . Hyperlipidemia   . Hypertension   . Macular degeneration   . Nephrolithiasis    stent  . PAF (paroxysmal atrial fibrillation) ([place])    a. 11/2013 Dx in setting of L MCA stroke. CHA2DS2VASc = 5-->Eliquis; b. 11/2013 Echo: Nl LV fxn. Mildly dil LA.  . Stroke ([place])    a. 11/2013 L MCA stroke.  Marland Kitchen Ureteral stricture     Past Surgical History:  Procedure Laterality Date  . CATARACT EXTRACTION W/PHACO Left 12/20/2014   Procedure: CATARACT EXTRACTION PHACO AND INTRAOCULAR LENS PLACEMENT (IOC);  Surgeon: Birder Robson, MD;  Location: [facility] ORS;  Service: Ophthalmology;  Laterality: Left;   [place]     00:59.6 AP%    21.2 CDE    12.66 fluid pack lot #[id number]  . JOINT REPLACEMENT Left 2014   knee partial replacement  . kidney stent Right 1995    There were no vitals filed for this visit.   Subjective Assessment - 02/01/20 1426    Subjective Pt pleasant, good  historian    Currently in Pain? No/denies              SLP Evaluation [facility] - 02/01/20 1426      SLP Visit Information   SLP Received On 02/01/20    Referring Provider (SLP) Fara Olden    Onset Date 01/28/2019    Medical Diagnosis Neurogenic Dysfluency      Subjective   Patient/Family Stated Goal to be able to speak more fluently      General Information   HPI Pt is a 87 year old male who has sustained several falls with most recent fall ~ 3 months ago. After most recent fall, pt started have dysfluencies. Head CT on 12/09/2019 was negative for acute abnormality but did reveal progression of atrophy and chronic ischemic changes in the white  matter and chronic infarct in the left frontal lobe.    Behavioral/Cognition appears to be good historian    Mobility Status ambulatory      Balance Screen   Has the patient fallen in the past 6 months Yes    How many times? unknown    Has the patient had a decrease in activity level because of a fear  of falling?  No    Is the patient reluctant to leave their home because of a fear of falling?  No      Prior Functional Status   Cognitive/Linguistic Baseline Information not available    Type of Home House     Lives With Spouse    Available Support Family    Education college    Vocation Retired      Associate Professor   Overall Cognitive Status Within Functional Limits for tasks assessed    Attention Alternating    Alternating Attention Appears intact    Memory Appears intact    Awareness Impaired    Awareness Impairment Emergent impairment   speech dysfluency   Problem Solving Appears intact      Auditory Comprehension   Overall Auditory Comprehension Appears within functional limits for tasks assessed   HOH and benfits from repetition, further complicated by mask     Reading Comprehension   Reading Status Within funtional limits      Expression   Primary Mode of Expression Verbal      Verbal Expression   Overall Verbal  Expression Appears within functional limits for tasks assessed    Initiation No impairment    Automatic Speech Name;Social Response    Level of Generative/Spontaneous Verbalization Conversation    Repetition No impairment    Naming No impairment    Pragmatics No impairment    Non-Verbal Means of Communication Not applicable      Written Expression   Dominant Hand Right    Written Expression Not tested      Oral Motor/Sensory Function   Overall Oral Motor/Sensory Function Appears within functional limits for tasks assessed      Motor Speech   Overall Motor Speech Impaired    Respiration Within functional limits    Phonation Normal    Resonance Within functional limits    Articulation Impaired    Level of Impairment Word    Intelligibility Intelligibility reduced    Word 50-74% accurate    Phrase 50-74% accurate    Sentence 25-49% accurate    Conversation 25-49% accurate    Motor Planning Impaired    Level of Impairment Word    Motor Speech Errors Inconsistent;Unaware;Aware    Phonation [facility]                           SLP Education - 02/01/20 1455    Education Details POC to target neurogenic dysfluency    Person(s) Educated Patient    Methods Explanation    Comprehension Verbalized understanding              SLP Long Term Goals - 02/01/20 1509      SLP LONG TERM GOAL #1   Title Pt will utilize fluency strategies to increase functional communication to participate in everday conversations.    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 Pt's dysfluencies are c/b part word repetition, phonemic repetitions and word substitutions. During a 5-minute conversation ~ 50% of pt's utterances were dysfluent which  resulted in decreased listener understanding. Pt was able to correctly repeat, produce multisyllabic words and describe  pictures. He didn't demonstrate any oral weaknesses or groping behaviors. He was intermittently aware of errors and was occasionally successful in correcting errors. He further provides that since his fall, his wife doesn't understand him multiple times per day. Skilled St is required to increase functional communication of wants/needs to increase pt's overall functional independence and reduce caregiver burden.    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 Diffuse brain atrophy ([place]) 12/09/2019  . Balance problem 12/03/2019  . Acquired thrombophilia ([place]) 12/03/2019  . Prediabetes 05/29/2019  . Benign prostatic hyperplasia with urinary frequency 08/20/2018  . Hearing aid worn 08/16/2017  . Paronychia of finger, left 08/16/2017  . Broca's aphasia 08/16/2017  . History of cataract surgery 08/27/2016  . Age-related macular degeneration 08/27/2016  . Counseling regarding end of life decision making 08/27/2016  . Do not resuscitate status 08/27/2016  . BPH associated with nocturia 02/11/2015  . PAF (paroxysmal atrial fibrillation) ([place]) 12/14/2013  . Hyperlipidemia LDL goal <100 12/12/2013  . CVD (cerebrovascular disease) 12/10/2013  . GERD (gastroesophageal reflux disease) 07/25/2013   . Left arm numbness 07/25/2013  . Chronic constipation 01/21/2013  . Encounter for preventive health examination 01/21/2013  . Essential hypertension, benign 10/21/2012  . Glaucoma 10/21/2012  . History of colonic polyps 10/21/2012  . Ureteral stricture, right 10/21/2012   Connie Lasater B. Rutherford Nail M.S., CCC-SLP, [place] Pathologist Rehabilitation Services Office [phone]  Stormy Fabian 02/01/2020, 3:12 PM  [place] MAIN [facility] SERVICES [address] [place], [place], [address] Phone: [phone]   Fax:  [phone]  Name: Todd Golden MRN: [id number] Date of Birth: 01/20/1931

## 2020-02-02 DIAGNOSIS — R4701 Aphasia: Secondary | ICD-10-CM | POA: Diagnosis not present

## 2020-02-02 DIAGNOSIS — Z9181 History of falling: Secondary | ICD-10-CM | POA: Diagnosis not present

## 2020-02-02 DIAGNOSIS — Z8673 Personal history of transient ischemic attack (TIA), and cerebral infarction without residual deficits: Secondary | ICD-10-CM | POA: Insufficient documentation

## 2020-02-02 DIAGNOSIS — R200 Anesthesia of skin: Secondary | ICD-10-CM | POA: Diagnosis not present

## 2020-02-02 DIAGNOSIS — R531 Weakness: Secondary | ICD-10-CM | POA: Diagnosis not present

## 2020-02-08 ENCOUNTER — Encounter: Payer: PPO | Admitting: Speech Pathology

## 2020-02-08 DIAGNOSIS — J301 Allergic rhinitis due to pollen: Secondary | ICD-10-CM | POA: Diagnosis not present

## 2020-02-08 DIAGNOSIS — J3089 Other allergic rhinitis: Secondary | ICD-10-CM | POA: Diagnosis not present

## 2020-02-10 DIAGNOSIS — J3089 Other allergic rhinitis: Secondary | ICD-10-CM | POA: Diagnosis not present

## 2020-02-10 DIAGNOSIS — J301 Allergic rhinitis due to pollen: Secondary | ICD-10-CM | POA: Diagnosis not present

## 2020-02-11 ENCOUNTER — Encounter: Payer: PPO | Admitting: Speech Pathology

## 2020-02-15 ENCOUNTER — Other Ambulatory Visit: Payer: Self-pay

## 2020-02-15 ENCOUNTER — Ambulatory Visit: Payer: PPO | Admitting: Speech Pathology

## 2020-02-15 DIAGNOSIS — R471 Dysarthria and anarthria: Secondary | ICD-10-CM

## 2020-02-15 DIAGNOSIS — G319 Degenerative disease of nervous system, unspecified: Secondary | ICD-10-CM

## 2020-02-15 DIAGNOSIS — I69923 Fluency disorder following unspecified cerebrovascular disease: Secondary | ICD-10-CM | POA: Diagnosis not present

## 2020-02-15 DIAGNOSIS — J3089 Other allergic rhinitis: Secondary | ICD-10-CM | POA: Diagnosis not present

## 2020-02-15 DIAGNOSIS — J301 Allergic rhinitis due to pollen: Secondary | ICD-10-CM | POA: Diagnosis not present

## 2020-02-16 NOTE — Therapy (Signed)
[place] MAIN [facility] SERVICES [address] [place], [place], [address] Phone: [phone]   Fax:  [phone]  Speech Language Pathology Treatment  Patient Details  Name: Todd Golden MRN: [id number] Date of Birth: 07/16/1930 Referring Provider (SLP): Fara Olden   Encounter Date: 02/15/2020   End of Session - 02/16/20 1257    Visit Number 2    Number of Visits 25    Date for SLP Re-Evaluation 04/25/20    Authorization Type [employer]    Authorization Time Period 02/01/2020 thru 04/25/2020    Authorization - Visit Number 2    Progress Note Due on Visit 10    SLP Start Time 1400    SLP Stop Time  1450    SLP Time Calculation (min) 50 min    Activity Tolerance Patient tolerated treatment well           Past Medical History:  Diagnosis Date  . Allergy   . Arthritis   . Asthma   . Cataract   . Chronic constipation   . Colon polyps   . Cough   . GERD (gastroesophageal reflux disease)   . Glaucoma   . HOH (hard of hearing)    a. Uses hearing aids  . Hyperlipidemia   . Hypertension   . Macular degeneration   . Nephrolithiasis    stent  . PAF (paroxysmal atrial fibrillation) ([place])    a. 11/2013 Dx in setting of L MCA stroke. CHA2DS2VASc = 5-->Eliquis; b. 11/2013 Echo: Nl LV fxn. Mildly dil LA.  . Stroke ([place])    a. 11/2013 L MCA stroke.  Marland Kitchen Ureteral stricture     Past Surgical History:  Procedure Laterality Date  . CATARACT EXTRACTION W/PHACO Left 12/20/2014   Procedure: CATARACT EXTRACTION PHACO AND INTRAOCULAR LENS PLACEMENT (IOC);  Surgeon: Birder Robson, MD;  Location: [facility] ORS;  Service: Ophthalmology;  Laterality: Left;   [place]     00:59.6 AP%    21.2 CDE    12.66 fluid pack lot #[id number] H  . JOINT REPLACEMENT Left 2014   knee partial replacement  . kidney stent Right 1995    There were no vitals filed for this visit.   Subjective Assessment - 02/16/20 1242    Subjective pt pleasant, very  dysfluent, states that if he slows down his speech is better    Currently in Pain? No/denies                 ADULT SLP TREATMENT - 02/16/20 0001      General Information   Behavior/Cognition Alert;Cooperative;Pleasant mood    HPI Pt is a 87 year old male who has sustained several falls with most recent fall ~ 3 months ago. After most recent fall, pt started have dysfluencies. Head CT on 12/09/2019 was negative for acute abnormality but did reveal progression of atrophy and chronic ischemic changes in the white  matter and chronic infarct in the left frontal lobe.      Treatment Provided   Treatment provided Cognitive-Linquistic      Pain Assessment   Pain Assessment No/denies pain      Cognitive-Linquistic Treatment   Treatment focused on Patient/family/caregiver education    Skilled Treatment Moderate multi-modal cues required to slow rate of speech when reading functional phrases to achieve ~ 80% fluent speech. When cues faded pt was able to carryover slow rate of speech initially but then as rate increased so did dysfluency.      Assessment /  Recommendations / Plan   Plan Continue with current plan of care      Progression Toward Goals   Progression toward goals Progressing toward goals            SLP Education - 02/16/20 1257    Education Details slow rate of speech decreases dysfluency    Person(s) Educated Patient    Methods Explanation;Demonstration;Verbal cues;Tactile cues;Handout    Comprehension Verbalized understanding;Need further instruction;Verbal cues required;Tactile cues required            SLP Short Term Goals - 02/16/20 1303      SLP SHORT TERM GOAL #1   Title With minimal cues, pt will read list of functional phrases at slow rate to reduce dysfluencies to < 10%.    Baseline Maximal cues required    Time 10    Period --   sessions   Status New      SLP SHORT TERM GOAL #2   Title Pt will demonstrate emergent awareness of dysfluencies by  self-monitoring and self-correcting dysfluencies with moderate cues.    Baseline decreased awareness    Time 10    Period --   sessions           SLP Long Term Goals - 02/01/20 1509      SLP LONG TERM GOAL #1   Title Pt will utilize fluency strategies to increase functional communication to participate in everday conversations.    Baseline able to communicate effectively 50% of time    Time 12    Period Weeks    Status New    Target Date 04/25/20            Plan - 02/16/20 1259    Clinical Impression Statement Skilled treatment session focused on pt's fluency goals. SLP facilitated session by providing moderate multi-modal cues to read functional phrases at a slow rate to decrease dysfluencies. When given novel list of words related to sports teams, pt required maximal multimodal cues to slow rate. Pt had difficulty self-monitoring and cues were not able to be faded. There was some carryover into conversation at the end of session.    Speech Therapy Frequency 2x / week    Duration 12 weeks    Treatment/Interventions Compensatory techniques;SLP instruction and feedback;Patient/family education    Potential to Achieve Goals Fair    Potential Considerations Co-morbidities;Medical prognosis    SLP Home Exercise Plan read list of functional phrases slowly    Consulted and Agree with Plan of Care Patient           Patient will benefit from skilled therapeutic intervention in order to improve the following deficits and impairments:   Dysarthria and anarthria  Diffuse brain atrophy [facility])    Problem List Patient Active Problem List   Diagnosis Date Noted  . Diffuse brain atrophy ([place]) 12/09/2019  . Balance problem 12/03/2019  . Acquired thrombophilia ([place]) 12/03/2019  . Prediabetes 05/29/2019  . Benign prostatic hyperplasia with urinary frequency 08/20/2018  . Hearing aid worn 08/16/2017  . Paronychia of finger, left 08/16/2017  . Broca's aphasia 08/16/2017  . History of  cataract surgery 08/27/2016  . Age-related macular degeneration 08/27/2016  . Counseling regarding end of life decision making 08/27/2016  . Do not resuscitate status 08/27/2016  . BPH associated with nocturia 02/11/2015  . PAF (paroxysmal atrial fibrillation) ([place]) 12/14/2013  . Hyperlipidemia LDL goal <100 12/12/2013  . CVD (cerebrovascular disease) 12/10/2013  . GERD (gastroesophageal reflux disease) 07/25/2013  . Left arm  numbness 07/25/2013  . Chronic constipation 01/21/2013  . Encounter for preventive health examination 01/21/2013  . Essential hypertension, benign 10/21/2012  . Glaucoma 10/21/2012  . History of colonic polyps 10/21/2012  . Ureteral stricture, right 10/21/2012   Chayton Murata B. Rutherford Nail M.S., CCC-SLP, [place] Pathologist Rehabilitation Services Office [phone]  Stormy Fabian 02/16/2020, 1:07 PM  [place] MAIN [facility] SERVICES [address] [place], [place], [address] Phone: [phone]   Fax:  [phone]   Name: Todd Golden MRN: [id number] Date of Birth: [birth year]

## 2020-02-16 NOTE — Patient Instructions (Signed)
Read list of functional phrases at slow rate of speech

## 2020-02-17 ENCOUNTER — Other Ambulatory Visit: Payer: Self-pay

## 2020-02-17 ENCOUNTER — Ambulatory Visit: Payer: PPO

## 2020-02-17 ENCOUNTER — Ambulatory Visit: Payer: PPO | Admitting: Speech Pathology

## 2020-02-17 VITALS — BP 128/83 | HR 94

## 2020-02-17 DIAGNOSIS — M6281 Muscle weakness (generalized): Secondary | ICD-10-CM

## 2020-02-17 DIAGNOSIS — J301 Allergic rhinitis due to pollen: Secondary | ICD-10-CM | POA: Diagnosis not present

## 2020-02-17 DIAGNOSIS — I69923 Fluency disorder following unspecified cerebrovascular disease: Secondary | ICD-10-CM | POA: Diagnosis not present

## 2020-02-17 DIAGNOSIS — R262 Difficulty in walking, not elsewhere classified: Secondary | ICD-10-CM

## 2020-02-17 DIAGNOSIS — R4701 Aphasia: Secondary | ICD-10-CM

## 2020-02-17 DIAGNOSIS — R471 Dysarthria and anarthria: Secondary | ICD-10-CM

## 2020-02-17 DIAGNOSIS — J3089 Other allergic rhinitis: Secondary | ICD-10-CM | POA: Diagnosis not present

## 2020-02-17 DIAGNOSIS — I679 Cerebrovascular disease, unspecified: Secondary | ICD-10-CM

## 2020-02-17 NOTE — Therapy (Addendum)
[place] MAIN [facility] SERVICES [place], [place], [address] Phone: [phone]   Fax:  [phone]  Physical Therapy Evaluation  Patient Details  Name: Todd Golden MRN: [id number] Date of Birth: 10-01-1930 Referring Provider (PT): Dr. Deborra Medina   Encounter Date: 02/17/2020    Past Medical History:  Diagnosis Date  . Allergy   . Arthritis   . Asthma   . Cataract   . Chronic constipation   . Colon polyps   . Cough   . GERD (gastroesophageal reflux disease)   . Glaucoma   . HOH (hard of hearing)    a. Uses hearing aids  . Hyperlipidemia   . Hypertension   . Macular degeneration   . Nephrolithiasis    stent  . PAF (paroxysmal atrial fibrillation) ([place])    a. 11/2013 Dx in setting of L MCA stroke. CHA2DS2VASc = 5-->Eliquis; b. 11/2013 Echo: Nl LV fxn. Mildly dil LA.  . Stroke ([place])    a. 11/2013 L MCA stroke.  Marland Kitchen Ureteral stricture     Past Surgical History:  Procedure Laterality Date  . CATARACT EXTRACTION W/PHACO Left 12/20/2014   Procedure: CATARACT EXTRACTION PHACO AND INTRAOCULAR LENS PLACEMENT (IOC);  Surgeon: Birder Robson, MD;  Location: [facility] ORS;  Service: Ophthalmology;  Laterality: Left;   [place]     00:59.6 AP%    21.2 CDE    12.66 fluid pack lot [id number]  . JOINT REPLACEMENT Left 2014   knee partial replacement  . kidney stent Right 1995     OBJECTIVE  MUSCULOSKELETAL: Tremor: Absent Bulk: Normal Tone: Normal, no clonus  Posture No gross abnormalities noted in standing or seated posture  Gait- Patient ambulated approx 100 feet in clinic on level surfaces exhibiting decreased step length and short reciprocal steps with decreased heal to toe sequencing.      Strength R/L 4/4 Hip flexion 4/4 Hip external rotation 4/4 Hip internal rotation 4/4 Hip extension  4/4 Hip abduction 4/4 Hip adduction 4/4 Knee extension 4/4 Knee flexion 4/4 Ankle Plantarflexion 4/4 Ankle  Dorsiflexion   NEUROLOGICAL:  Mental Status Patient is oriented to person, place and time.  Recent memory is intact.  Remote memory is intact.  Attention span and concentration are intact.  Expressive speech is impaired- Currently working with Speech. Patient's fund of knowledge is within normal limits for educational level.  Sensation Grossly intact to light touch bilateral UEs/LEs as determined by testing dermatomes C2-T2/L2-S2 respectively Proprioception and hot/cold testing deferred on this date  Coordination/Cerebellar Finger to Nose: WNL Heel to Shin: WNL Rapid alternating movements: WNL Finger Opposition: WNL    FUNCTIONAL OUTCOME MEASURES   Results Comments  BERG 50/56 Fall risk, in need of intervention          TUG  13 seconds No device  5TSTS  22 seconds No UE support      10 Meter Gait Speed Self-selected: 12s =0.83 m/s;  Below normative values for full community ambulation    ASSESSMENT Clinical Impression: Pt is a pleasant 87 year-old male/male referred to PT services for difficulty with balance and recent falls. Pt presents with deficits in strength, gait and balance.  Patient demonstrated impaired balance with fall history and risk of falling. He also presents with decreased gait speed and LE weakness contributing to fall risk factors.  He will benefit from skilled PT services to address above mentioned deficits in balance and decrease risk for future falls.  Objective measurements completed on examination: See above findings.                 PT Short Term Goals - 02/17/20 1045      PT SHORT TERM GOAL #1   Title Patient will be independent in home exercise program to improve strength/mobility for better functional independence with ADLs.    Baseline 02/17/2020- Patient has no formal HEP in place.    Time 6    Period Weeks    Status New    Target Date 03/30/20             PT Long Term Goals -  02/17/20 1219      PT LONG TERM GOAL #1   Title Patient will increase FOTO score to equal to or greater than 69 to demonstrate statistically significant improvement in mobility and quality of life.    Baseline 02/17/2020= 59    Time 12    Period Weeks    Status New    Target Date 05/11/20      PT LONG TERM GOAL #2   Title Patient (> 59 years old) will complete five times sit to stand test in < 15 seconds indicating an increased LE strength and improved balance.    Baseline 02/17/2020- 22 sec with no UE support    Time 12    Period Weeks    Status New    Target Date 05/11/20      PT LONG TERM GOAL #3   Title Pt will improve BERG from 50/56 to 53/56 or higher in order to demonstrate clinically significant improvement in balance.    Baseline 02/17/2020= 50/56    Time 12    Period Weeks    Status New    Target Date 05/11/20      PT LONG TERM GOAL #4   Title Pt will decrease TUG to below 11 seconds/decrease in order to demonstrate decreased fall risk.    Baseline 02/17/20- 13 sec without an assistive device.    Time 12    Period Weeks    Status New    Target Date 05/11/20      PT LONG TERM GOAL #5   Title Patient will demonstrate improved overall ambulation > 800 feet on all surfaces without an assistive device without loss of balance for improved community distances.    Baseline 02/17/2020-Patient reports limited ambulation in [place] community to approx 1 block    Time 12    Period Weeks    Status New    Target Date 05/11/20                   Patient will benefit from skilled therapeutic intervention in order to improve the following deficits and impairments:  Abnormal gait,Decreased activity tolerance,Cardiopulmonary status limiting activity,Decreased balance,Decreased endurance,Decreased mobility,Decreased strength,Difficulty walking,Impaired UE functional use  Visit Diagnosis: Muscle weakness (generalized) - Plan: PT plan of care cert/re-cert  Difficulty  in walking, not elsewhere classified - Plan: PT plan of care cert/re-cert     Problem List Patient Active Problem List   Diagnosis Date Noted  . Diffuse brain atrophy ([place]) 12/09/2019  . Balance problem 12/03/2019  . Acquired thrombophilia ([place]) 12/03/2019  . Prediabetes 05/29/2019  . Benign prostatic hyperplasia with urinary frequency 08/20/2018  . Hearing aid worn 08/16/2017  . Paronychia of finger, left 08/16/2017  . Broca's aphasia 08/16/2017  . History of cataract surgery 08/27/2016  . Age-related macular degeneration 08/27/2016  . Counseling regarding end  of life decision making 08/27/2016  . Do not resuscitate status 08/27/2016  . BPH associated with nocturia 02/11/2015  . PAF (paroxysmal atrial fibrillation) ([place]) 12/14/2013  . Hyperlipidemia LDL goal <100 12/12/2013  . CVD (cerebrovascular disease) 12/10/2013  . GERD (gastroesophageal reflux disease) 07/25/2013  . Left arm numbness 07/25/2013  . Chronic constipation 01/21/2013  . Encounter for preventive health examination 01/21/2013  . Essential hypertension, benign 10/21/2012  . Glaucoma 10/21/2012  . History of colonic polyps 10/21/2012  . Ureteral stricture, right 10/21/2012    Lewis Moccasin, PT 02/22/2020, 12:59 PM  [place] MAIN [facility] SERVICES [address] [place], [place], [address] Phone: [phone]   Fax:  [phone]  Name: Todd Golden MRN: [id number] Date of Birth: Nov 26, 1930

## 2020-02-18 NOTE — Therapy (Signed)
[place] MAIN [facility] SERVICES [address] [place], [place], [address] Phone: [phone]   Fax:  [phone]  Speech Language Pathology Treatment  Patient Details  Name: Todd Golden MRN: [id number] Date of Birth: 06-08-1930 Referring Provider (SLP): Fara Olden   Encounter Date: 02/17/2020   End of Session - 02/18/20 1250    Visit Number 3    Number of Visits 25    Date for SLP Re-Evaluation 04/25/20    Authorization Type [employer]    Authorization Time Period 02/01/2020 thru 04/25/2020    Authorization - Visit Number 3    Progress Note Due on Visit 10    SLP Start Time 1000    SLP Stop Time  1100    SLP Time Calculation (min) 60 min    Activity Tolerance Patient tolerated treatment well           Past Medical History:  Diagnosis Date  . Allergy   . Arthritis   . Asthma   . Cataract   . Chronic constipation   . Colon polyps   . Cough   . GERD (gastroesophageal reflux disease)   . Glaucoma   . HOH (hard of hearing)    a. Uses hearing aids  . Hyperlipidemia   . Hypertension   . Macular degeneration   . Nephrolithiasis    stent  . PAF (paroxysmal atrial fibrillation) ([place])    a. 11/2013 Dx in setting of L MCA stroke. CHA2DS2VASc = 5-->Eliquis; b. 11/2013 Echo: Nl LV fxn. Mildly dil LA.  . Stroke ([place])    a. 11/2013 L MCA stroke.  Marland Kitchen Ureteral stricture     Past Surgical History:  Procedure Laterality Date  . CATARACT EXTRACTION W/PHACO Left 12/20/2014   Procedure: CATARACT EXTRACTION PHACO AND INTRAOCULAR LENS PLACEMENT (IOC);  Surgeon: Birder Robson, MD;  Location: [facility] ORS;  Service: Ophthalmology;  Laterality: Left;   [place]     00:59.6 AP%    21.2 CDE    12.66 fluid pack lot #[id number]  . JOINT REPLACEMENT Left 2014   knee partial replacement  . kidney stent Right 1995    There were no vitals filed for this visit.   Subjective Assessment - 02/18/20 0802    Subjective pt pleasant, very  conversant, improved speech intelligibility during conversation noted    Currently in Pain? No/denies                 ADULT SLP TREATMENT - 02/18/20 0001      General Information   Behavior/Cognition Alert;Cooperative;Pleasant mood    HPI Pt is a 87 year old male who has sustained several falls with most recent fall ~ 3 months ago. Pt suffered a severe fall striking his right side of head on the wooden boarder of a raised garden. Head CT on 12/09/2019 was negative for acute abnormality and revealed progression of atrophy and chronic ischemic changes in the white matter and chronic infarct in the left frontal lobe. 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 Pt's speech-language abilities returned without ST intervention. While pt's head CT was negative after most recent fall, his speech was immediately dysfluent with anomia that have not resolved. As a result, Outpatient ST services were ordered.  Treatment Provided   Treatment provided Cognitive-Linquistic      Pain Assessment   Pain Assessment No/denies pain      Cognitive-Linquistic Treatment   Treatment focused on Apraxia;Aphasia;Patient/family/caregiver education    Skilled Treatment Ongoing diagnostic assessment of speech language abilities      Assessment / Recommendations / Plan   Plan Continue with current plan of care      Progression Toward Goals   Progression toward goals Progressing toward goals            SLP Education - 02/18/20 1250    Education Details location of previous stroke, fall and the impact that these events have on expressive language abilities    Person(s) Educated Patient    Methods Explanation;Demonstration;Verbal  cues;Handout    Comprehension Verbalized understanding;Returned demonstration;Verbal cues required;Need further instruction            SLP Short Term Goals - 02/18/20 1254      SLP SHORT TERM GOAL #1   Title With minimal cues, pt will read list of functional phrases at slow rate to reduce dysfluencies to < 10%.    Baseline Maximal cues required    Time 10    Period --   sessions   Status New      SLP SHORT TERM GOAL #2   Title Pt will demonstrate emergent awareness of dysfluencies by self-monitoring and self-correcting dysfluencies with moderate cues.    Baseline decreased awareness    Time 10    Period --   sessions     SLP SHORT TERM GOAL #3   Title With minimal assistance, pt will generate 10 items in a basic category with 80% accuracy.    Baseline new goal    Time 10    Period --   sessions   Status New      SLP SHORT TERM GOAL #4   Title Given moderate cues, pt will complete basic functional written words by writing appropriate letter in blank with 90% accuracy.    Baseline unable    Time 10    Period --   sessions   Status New            SLP Long Term Goals - 02/18/20 1257      SLP LONG TERM GOAL #1   Title Pt will utilize fluency strategies to increase functional communication to participate in everday conversations.    Baseline able to communicate effectively 50% of time    Time 12    Period Weeks    Status New    Target Date 04/25/20      SLP LONG TERM GOAL #2   Title Pt will utilize word finding strategies to convey simple wants and needs in 50% of opportunities.    Baseline new goal    Time 12    Period Weeks    Status New    Target Date 04/25/20      SLP LONG TERM GOAL #3   Title Pt will will be able to write personal information in 50% of opportunities.    Baseline unable to correctly spell, address, zip code or his number    Time 12    Period Weeks    Status New    Target Date 04/25/20            Plan - 02/18/20 1251    Clinical  Impression Statement Skilled treatment session focused on continue diagnostic assessment of pt's speech-language abilities. Pt was moderately independent when  using slow rate to read list of functional phrases and novel words related to sports. As pt's fluency increased, he was observed to have word finding deifficulty with semantic paraphasias in reading. Additionally, pt is not able spell or write to dictation. As such, additional goals have been added.    Speech Therapy Frequency 2x / week    Duration 12 weeks    Treatment/Interventions Compensatory techniques;SLP instruction and feedback;Patient/family education    Potential to Achieve Goals Fair    Potential Considerations Co-morbidities;Medical prognosis    SLP Home Exercise Plan read list of functional phrases and list of sports slowly    Consulted and Agree with Plan of Care Patient           Patient will benefit from skilled therapeutic intervention in order to improve the following deficits and impairments:   Aphasia  Dysarthria and anarthria  Broca's aphasia  CVD (cerebrovascular disease)    Problem List Patient Active Problem List   Diagnosis Date Noted  . Diffuse brain atrophy ([place]) 12/09/2019  . Balance problem 12/03/2019  . Acquired thrombophilia ([place]) 12/03/2019  . Prediabetes 05/29/2019  . Benign prostatic hyperplasia with urinary frequency 08/20/2018  . Hearing aid worn 08/16/2017  . Paronychia of finger, left 08/16/2017  . Broca's aphasia 08/16/2017  . History of cataract surgery 08/27/2016  . Age-related macular degeneration 08/27/2016  . Counseling regarding end of life decision making 08/27/2016  . Do not resuscitate status 08/27/2016  . BPH associated with nocturia 02/11/2015  . PAF (paroxysmal atrial fibrillation) ([place]) 12/14/2013  . Hyperlipidemia LDL goal <100 12/12/2013  . CVD (cerebrovascular disease) 12/10/2013  . GERD (gastroesophageal reflux disease) 07/25/2013  . Left arm numbness 07/25/2013   . Chronic constipation 01/21/2013  . Encounter for preventive health examination 01/21/2013  . Essential hypertension, benign 10/21/2012  . Glaucoma 10/21/2012  . History of colonic polyps 10/21/2012  . Ureteral stricture, right 10/21/2012   Aeon Koors B. Rutherford Nail M.S., CCC-SLP, [place] Pathologist Rehabilitation Services Office [phone]   Stormy Fabian 02/18/2020, 1:03 PM  [place] MAIN [facility] SERVICES [address] [place], [place], [address] Phone: [phone]   Fax:  [phone]   Name: Todd Golden MRN: [id number] Date of Birth: Mar 03, 1930

## 2020-02-21 ENCOUNTER — Encounter: Payer: PPO | Admitting: Speech Pathology

## 2020-02-22 ENCOUNTER — Other Ambulatory Visit: Payer: Self-pay

## 2020-02-22 ENCOUNTER — Ambulatory Visit: Payer: PPO

## 2020-02-22 ENCOUNTER — Ambulatory Visit: Payer: PPO | Attending: Internal Medicine | Admitting: Speech Pathology

## 2020-02-22 DIAGNOSIS — R262 Difficulty in walking, not elsewhere classified: Secondary | ICD-10-CM | POA: Diagnosis not present

## 2020-02-22 DIAGNOSIS — R41841 Cognitive communication deficit: Secondary | ICD-10-CM | POA: Insufficient documentation

## 2020-02-22 DIAGNOSIS — R482 Apraxia: Secondary | ICD-10-CM

## 2020-02-22 DIAGNOSIS — M6281 Muscle weakness (generalized): Secondary | ICD-10-CM | POA: Insufficient documentation

## 2020-02-22 DIAGNOSIS — G319 Degenerative disease of nervous system, unspecified: Secondary | ICD-10-CM | POA: Insufficient documentation

## 2020-02-22 DIAGNOSIS — R4701 Aphasia: Secondary | ICD-10-CM | POA: Diagnosis not present

## 2020-02-22 DIAGNOSIS — I69923 Fluency disorder following unspecified cerebrovascular disease: Secondary | ICD-10-CM | POA: Diagnosis not present

## 2020-02-22 NOTE — Therapy (Signed)
[place] MAIN [facility] SERVICES [address] [place], [place], [address] Phone: [phone]   Fax:  [phone]  Physical Therapy Treatment  Patient Details  Name: Todd Golden MRN: [id number] Date of Birth: Mar 28, 1930 Referring Provider (PT): Dr. Deborra Medina   Encounter Date: 02/22/2020   PT End of Session - 02/22/20 1242    Visit Number 2    Number of Visits 25    Date for PT Re-Evaluation 05/11/20    Authorization Type 02/17/2020- Initial PT evaluation    PT Start Time 1108    PT Stop Time 1150    PT Time Calculation (min) 42 min    Equipment Utilized During Treatment Gait belt    Activity Tolerance Patient tolerated treatment well    Behavior During Therapy [facility] for tasks assessed/performed           Past Medical History:  Diagnosis Date  . Allergy   . Arthritis   . Asthma   . Cataract   . Chronic constipation   . Colon polyps   . Cough   . GERD (gastroesophageal reflux disease)   . Glaucoma   . HOH (hard of hearing)    a. Uses hearing aids  . Hyperlipidemia   . Hypertension   . Macular degeneration   . Nephrolithiasis    stent  . PAF (paroxysmal atrial fibrillation) ([place])    a. 11/2013 Dx in setting of L MCA stroke. CHA2DS2VASc = 5-->Eliquis; b. 11/2013 Echo: Nl LV fxn. Mildly dil LA.  . Stroke ([place])    a. 11/2013 L MCA stroke.  Marland Kitchen Ureteral stricture     Past Surgical History:  Procedure Laterality Date  . CATARACT EXTRACTION W/PHACO Left 12/20/2014   Procedure: CATARACT EXTRACTION PHACO AND INTRAOCULAR LENS PLACEMENT (IOC);  Surgeon: Birder Robson, MD;  Location: [facility] ORS;  Service: Ophthalmology;  Laterality: Left;   [place]     00:59.6 AP%    21.2 CDE    12.66 fluid pack lot #[id number] H  . JOINT REPLACEMENT Left 2014   knee partial replacement  . kidney stent Right 1995    There were no vitals filed for this visit.   Subjective Assessment - 02/22/20 1122    Subjective Patient reports doing well today. "My  wife tells me I am walking like an old man."    Patient is accompained by: Family member    Limitations Walking    Patient Stated Goals Patient reports his goal not to fall and regain some strength in his legs.    Currently in Pain? No/denies           Neuromuscular re-ed  Resistive gait- using Matrix cable system at 12.5# x 6 reps (approx 10 feet) both directions Step ups on 6" step without UE support- Mild difficulty initially yet improved with practice. Static stand on purple pad with initally feet apart and eyes open x 20 sec -progressed to eyes closed x 20 sec x 2 sets (increased unsteadiness with occasional reaching for bar). Patient then positioned with feet narrowed x 20 sec with eyes open (no unsteadiness) followed by eyes closed x 20 sec (some unsteadiness noted). Lastly perform with feet on pad in staggered position with eyes open x 20 sec x 2 trials- Some difficulty maintaining position requiring intermittent reaching for bar- improved with practice.   Pt educated throughout session about proper posture and technique with exercises. Improved exercise technique, movement at target joints, use of target muscles after min to mod  verbal, visual, tactile cues.  Patient then performed the below seated exercises for 10 reps each leg using green theraband for hip and knee ex and AROM for ankle and sit to stand exercise. Added the below home program for patient today.    Access Code: [facility] URL: [email or web address] Date: 02/22/2020 Prepared by: Sande Brothers, PT  Exercises Seated Hip Flexion March with Ankle Weights - 1 x daily - 5 x weekly - 3 sets - 10 reps Seated Hip Abduction with Pelvic Floor Contraction and Resistance Loop - 1 x daily - 5 x weekly - 3 sets - 10 reps Seated Knee Extension with Resistance - 1 x daily - 5 x weekly - 3 sets - 10 reps Seated Heel Raise - 1 x daily - 5 x weekly - 3 sets - 10 reps Seated Toe Raise - 1 x daily - 5 x weekly - 3  sets - 10 reps Sit to Stand without Arm Support - 1 x daily - 5 x weekly - 3 sets - 10 reps                         PT Education - 02/22/20 1134    Education Details Educated in strengthening and balance exercises today.    Person(s) Educated Patient    Methods Explanation;Demonstration;Tactile cues;Verbal cues;Handout    Comprehension Verbalized understanding;Returned demonstration;Need further instruction;Tactile cues required;Verbal cues required            PT Short Term Goals - 02/17/20 1045      PT SHORT TERM GOAL #1   Title Patient will be independent in home exercise program to improve strength/mobility for better functional independence with ADLs.    Baseline 02/17/2020- Patient has no formal HEP in place.    Time 6    Period Weeks    Status New    Target Date 03/30/20             PT Long Term Goals - 02/17/20 1219      PT LONG TERM GOAL #1   Title Patient will increase FOTO score to equal to or greater than 69 to demonstrate statistically significant improvement in mobility and quality of life.    Baseline 02/17/2020= 59    Time 12    Period Weeks    Status New    Target Date 05/11/20      PT LONG TERM GOAL #2   Title Patient (> 64 years old) will complete five times sit to stand test in < 15 seconds indicating an increased LE strength and improved balance.    Baseline 02/17/2020- 22 sec with no UE support    Time 12    Period Weeks    Status New    Target Date 05/11/20      PT LONG TERM GOAL #3   Title Pt will improve BERG from 50/56 to 53/56 or higher in order to demonstrate clinically significant improvement in balance.    Baseline 02/17/2020= 50/56    Time 12    Period Weeks    Status New    Target Date 05/11/20      PT LONG TERM GOAL #4   Title Pt will decrease TUG to below 11 seconds/decrease in order to demonstrate decreased fall risk.    Baseline 02/17/20- 13 sec without an assistive device.    Time 12    Period Weeks     Status New    Target Date 05/11/20  PT LONG TERM GOAL #5   Title Patient will demonstrate improved overall ambulation > 800 feet on all surfaces without an assistive device without loss of balance for improved community distances.    Baseline 02/17/2020-Patient reports limited ambulation in [place] to approx 1 block    Time 12    Period Weeks    Status New    Target Date 05/11/20                 Plan - 02/22/20 1246    Clinical Impression Statement Patient responded well to introduction of seated LE strengthening and balance activities. He was challenged with eyes closed and presents as visually preferenced. Patient required verbal cues and visual demo to perform therex correctly and successfully added to home program. He will benefit from skilled PT interventions to continue to progress his strength, balance and mobility to reduce risk of falling and restore indpendent mobility.    Personal Factors and Comorbidities Age;Comorbidity 1    Comorbidities HTN    Examination-Activity Limitations Reach Overhead;Stairs;Dressing;Carry;Locomotion Level    Examination-Participation Restrictions Yard Work    Stability/Clinical Decision Making Stable/Uncomplicated    Rehab Potential Good    PT Frequency 2x / week    PT Duration 12 weeks    PT Treatment/Interventions Cryotherapy;Moist Heat;DME Instruction;Gait training;Stair training;Functional mobility training;Therapeutic activities;Therapeutic exercise;Balance training;Neuromuscular re-education;Patient/family education;Manual techniques;Passive range of motion    PT Next Visit Plan Continue with progressive LE strengthening, balance, gait training.    PT Home Exercise Plan Initiated home program today.    Consulted and Agree with Plan of Care Patient           Patient will benefit from skilled therapeutic intervention in order to improve the following deficits and impairments:  Abnormal gait,Decreased activity  tolerance,Cardiopulmonary status limiting activity,Decreased balance,Decreased endurance,Decreased mobility,Decreased strength,Difficulty walking,Impaired UE functional use  Visit Diagnosis: Muscle weakness (generalized)  Difficulty in walking, not elsewhere classified     Problem List Patient Active Problem List   Diagnosis Date Noted  . Diffuse brain atrophy ([facility]) 12/09/2019  . Balance problem 12/03/2019  . Acquired thrombophilia ([facility]) 12/03/2019  . Prediabetes 05/29/2019  . Benign prostatic hyperplasia with urinary frequency 08/20/2018  . Hearing aid worn 08/16/2017  . Paronychia of finger, left 08/16/2017  . Broca's aphasia 08/16/2017  . History of cataract surgery 08/27/2016  . Age-related macular degeneration 08/27/2016  . Counseling regarding end of life decision making 08/27/2016  . Do not resuscitate status 08/27/2016  . BPH associated with nocturia 02/11/2015  . PAF (paroxysmal atrial fibrillation) ([facility]) 12/14/2013  . Hyperlipidemia LDL goal <100 12/12/2013  . CVD (cerebrovascular disease) 12/10/2013  . GERD (gastroesophageal reflux disease) 07/25/2013  . Left arm numbness 07/25/2013  . Chronic constipation 01/21/2013  . Encounter for preventive health examination 01/21/2013  . Essential hypertension, benign 10/21/2012  . Glaucoma 10/21/2012  . History of colonic polyps 10/21/2012  . Ureteral stricture, right 10/21/2012    Lenda Kelp, PT 02/22/2020, 2:25 PM  [place] [facility] MAIN [facility] SERVICES [address] [place], [place], [address] Phone: [phone]   Fax:  [phone]  Name: ZAIR BORAWSKI MRN: [id number] Date of Birth: [birth year]

## 2020-02-23 NOTE — Patient Instructions (Signed)
Complete worksheets from [place] workbook

## 2020-02-23 NOTE — Therapy (Signed)
[place] MAIN [facility] SERVICES [address] [place], [place], [address] Phone: [phone]   Fax:  [phone]  Speech Language Pathology Treatment  Patient Details  Name: Todd Golden MRN: [id number] Date of Birth: 04-Jul-1930 Referring Provider (SLP): Fara Olden   Encounter Date: 02/22/2020   End of Session - 02/23/20 1752    Visit Number 4    Number of Visits 25    Date for SLP Re-Evaluation 04/25/20    Authorization Type [employer]    Authorization Time Period 02/01/2020 thru 04/25/2020    Authorization - Visit Number 4    Progress Note Due on Visit 10    SLP Start Time 1000    SLP Stop Time  1100    SLP Time Calculation (min) 60 min    Activity Tolerance Patient tolerated treatment well           Past Medical History:  Diagnosis Date  . Allergy   . Arthritis   . Asthma   . Cataract   . Chronic constipation   . Colon polyps   . Cough   . GERD (gastroesophageal reflux disease)   . Glaucoma   . HOH (hard of hearing)    a. Uses hearing aids  . Hyperlipidemia   . Hypertension   . Macular degeneration   . Nephrolithiasis    stent  . PAF (paroxysmal atrial fibrillation) ([place])    a. 11/2013 Dx in setting of L MCA stroke. CHA2DS2VASc = 5-->Eliquis; b. 11/2013 Echo: Nl LV fxn. Mildly dil LA.  . Stroke ([place])    a. 11/2013 L MCA stroke.  Marland Kitchen Ureteral stricture     Past Surgical History:  Procedure Laterality Date  . CATARACT EXTRACTION W/PHACO Left 12/20/2014   Procedure: CATARACT EXTRACTION PHACO AND INTRAOCULAR LENS PLACEMENT (IOC);  Surgeon: Birder Robson, MD;  Location: [facility] ORS;  Service: Ophthalmology;  Laterality: Left;   [place]     00:59.6 AP%    21.2 CDE    12.66 fluid pack lot #[id number] H  . JOINT REPLACEMENT Left 2014   knee partial replacement  . kidney stent Right 1995    There were no vitals filed for this visit.   Subjective Assessment - 02/23/20 1747    Subjective pt pleasant, accompanied  by wife, appeared to struggle with concepts presented in session    Patient is accompained by: Family member    Currently in Pain? No/denies                 ADULT SLP TREATMENT - 02/23/20 0001      General Information   Behavior/Cognition Alert;Cooperative;Pleasant mood;Doesn't follow directions;Requires cueing    HPI Pt is a 87 year old male who has sustained several falls with most recent fall ~ 3 months ago. Pt suffered a severe fall striking his right side of head on the wooden boarder of a raised garden. Head CT on 12/09/2019 was negative for acute abnormality and revealed progression of atrophy and chronic ischemic changes in the white matter and chronic infarct in the left frontal lobe. 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 Pt's speech-language abilities returned without ST intervention. While pt's head CT was negative after most recent fall, his speech was immediately  dysfluent with anomia that have not resolved. As a result, Outpatient ST services were ordered.      Treatment Provided   Treatment provided Cognitive-Linquistic      Pain Assessment   Pain Assessment No/denies pain      Cognitive-Linquistic Treatment   Treatment focused on Apraxia;Aphasia;Patient/family/caregiver education    Skilled Treatment Maximal assistance to perform phrase level language based activities; written language introduced      Assessment / Recommendations / [place] with current plan of care      Progression Toward Goals   Progression toward goals Progressing toward goals            SLP Education - 02/23/20 1751    Education Details impact that writing has on language production, need to practice  correct motor patterns during speech production    Person(s) Educated Patient;Spouse    Methods Explanation;Demonstration;Tactile cues;Verbal cues;Handout    Comprehension Verbalized understanding;Verbal cues required;Tactile cues required;Need further instruction            SLP Short Term Goals - 02/18/20 1254      SLP SHORT TERM GOAL #1   Title With minimal cues, pt will read list of functional phrases at slow rate to reduce dysfluencies to < 10%.    Baseline Maximal cues required    Time 10    Period --   sessions   Status New      SLP SHORT TERM GOAL #2   Title Pt will demonstrate emergent awareness of dysfluencies by self-monitoring and self-correcting dysfluencies with moderate cues.    Baseline decreased awareness    Time 10    Period --   sessions     SLP SHORT TERM GOAL #3   Title With minimal assistance, pt will generate 10 items in a basic category with 80% accuracy.    Baseline new goal    Time 10    Period --   sessions   Status New      SLP SHORT TERM GOAL #4   Title Given moderate cues, pt will complete basic functional written words by writing appropriate letter in blank with 90% accuracy.    Baseline unable    Time 10    Period --   sessions   Status New            SLP Long Term Goals - 02/18/20 1257      SLP LONG TERM GOAL #1   Title Pt will utilize fluency strategies to increase functional communication to participate in everday conversations.    Baseline able to communicate effectively 50% of time    Time 12    Period Weeks    Status New    Target Date 04/25/20      SLP LONG TERM GOAL #2   Title Pt will utilize word finding strategies to convey simple wants and needs in 50% of opportunities.    Baseline new goal    Time 12    Period Weeks    Status New    Target Date 04/25/20      SLP LONG TERM GOAL #3   Title Pt will will be able to write personal information in 50% of opportunities.    Baseline unable to correctly spell, address, zip  code or his number    Time 12    Period Weeks    Status New    Target Date 04/25/20  Plan - 02/23/20 1752    Clinical Impression Statement Skilled treatment session foucsed on pt's speech intelligibility, word finding and written language. Despite maximal multimodal assistance from this writer and pt's wife, he had great difficulty understanding and following SLP's instruction. As a result, pt's speech was more dysfluent and his ability to accuracy perform tasks was reduced. Extensive education provided on therapeutic activities. While pt voiced understanding, suspect that he will require further education.    Speech Therapy Frequency 2x / week    Duration 12 weeks    Treatment/Interventions Compensatory techniques;SLP instruction and feedback;Patient/family education;Language facilitation;Functional tasks    Potential to Achieve Goals Fair    Potential Considerations Co-morbidities;Medical prognosis;Severity of impairments    SLP Home Exercise Plan provided    Consulted and Agree with Plan of Care Patient;Family member/caregiver    Family Member Consulted pt's wife           Patient will benefit from skilled therapeutic intervention in order to improve the following deficits and impairments:   Aphasia  Apraxia  Broca's aphasia    Problem List Patient Active Problem List   Diagnosis Date Noted  . Diffuse brain atrophy ([place]) 12/09/2019  . Balance problem 12/03/2019  . Acquired thrombophilia ([place]) 12/03/2019  . Prediabetes 05/29/2019  . Benign prostatic hyperplasia with urinary frequency 08/20/2018  . Hearing aid worn 08/16/2017  . Paronychia of finger, left 08/16/2017  . Broca's aphasia 08/16/2017  . History of cataract surgery 08/27/2016  . Age-related macular degeneration 08/27/2016  . Counseling regarding end of life decision making 08/27/2016  . Do not resuscitate status 08/27/2016  . BPH associated with nocturia 02/11/2015  . PAF (paroxysmal atrial  fibrillation) ([place]) 12/14/2013  . Hyperlipidemia LDL goal <100 12/12/2013  . CVD (cerebrovascular disease) 12/10/2013  . GERD (gastroesophageal reflux disease) 07/25/2013  . Left arm numbness 07/25/2013  . Chronic constipation 01/21/2013  . Encounter for preventive health examination 01/21/2013  . Essential hypertension, benign 10/21/2012  . Glaucoma 10/21/2012  . History of colonic polyps 10/21/2012  . Ureteral stricture, right 10/21/2012   Elver Stadler B. Rutherford Nail M.S., CCC-SLP, [place] Pathologist Rehabilitation Services Office [phone]  Stormy Fabian 02/23/2020, 5:57 PM  [place] MAIN [facility] SERVICES [address] [place], [place], [address] Phone: [phone]   Fax:  [phone]   Name: ROMOLO SIELING MRN: [id number] Date of Birth: [birth year]

## 2020-02-24 ENCOUNTER — Ambulatory Visit: Payer: PPO | Admitting: Speech Pathology

## 2020-02-24 ENCOUNTER — Ambulatory Visit: Payer: PPO

## 2020-02-25 ENCOUNTER — Other Ambulatory Visit: Payer: Self-pay | Admitting: Internal Medicine

## 2020-02-29 ENCOUNTER — Ambulatory Visit: Payer: PPO | Admitting: Speech Pathology

## 2020-02-29 ENCOUNTER — Ambulatory Visit: Payer: PPO

## 2020-02-29 ENCOUNTER — Other Ambulatory Visit: Payer: Self-pay

## 2020-02-29 DIAGNOSIS — R4701 Aphasia: Secondary | ICD-10-CM | POA: Diagnosis not present

## 2020-02-29 DIAGNOSIS — M6281 Muscle weakness (generalized): Secondary | ICD-10-CM

## 2020-02-29 DIAGNOSIS — R262 Difficulty in walking, not elsewhere classified: Secondary | ICD-10-CM

## 2020-02-29 DIAGNOSIS — J301 Allergic rhinitis due to pollen: Secondary | ICD-10-CM | POA: Diagnosis not present

## 2020-02-29 DIAGNOSIS — J3089 Other allergic rhinitis: Secondary | ICD-10-CM | POA: Diagnosis not present

## 2020-02-29 DIAGNOSIS — R482 Apraxia: Secondary | ICD-10-CM

## 2020-02-29 NOTE — Therapy (Signed)
[place] MAIN [facility] SERVICES [address] [place], [place], [address] Phone: [phone]   Fax:  [phone]  Physical Therapy Treatment  Patient Details  Name: Todd Golden MRN: [id number] Date of Birth: 1930-09-23 Referring Provider (PT): Dr. Deborra Medina   Encounter Date: 02/29/2020   PT End of Session - 02/29/20 0946    Visit Number 3    Number of Visits 25    Date for PT Re-Evaluation 05/11/20    Authorization Type 02/17/2020- Initial PT evaluation    PT Start Time 0916    PT Stop Time 0958    PT Time Calculation (min) 42 min    Equipment Utilized During Treatment Gait belt    Activity Tolerance Patient tolerated treatment well    Behavior During Therapy [facility] for tasks assessed/performed           Past Medical History:  Diagnosis Date  . Allergy   . Arthritis   . Asthma   . Cataract   . Chronic constipation   . Colon polyps   . Cough   . GERD (gastroesophageal reflux disease)   . Glaucoma   . HOH (hard of hearing)    a. Uses hearing aids  . Hyperlipidemia   . Hypertension   . Macular degeneration   . Nephrolithiasis    stent  . PAF (paroxysmal atrial fibrillation) ([place])    a. 11/2013 Dx in setting of L MCA stroke. CHA2DS2VASc = 5-->Eliquis; b. 11/2013 Echo: Nl LV fxn. Mildly dil LA.  . Stroke ([place])    a. 11/2013 L MCA stroke.  Marland Kitchen Ureteral stricture     Past Surgical History:  Procedure Laterality Date  . CATARACT EXTRACTION W/PHACO Left 12/20/2014   Procedure: CATARACT EXTRACTION PHACO AND INTRAOCULAR LENS PLACEMENT (IOC);  Surgeon: Birder Robson, MD;  Location: [facility] ORS;  Service: Ophthalmology;  Laterality: Left;   [place]     00:59.6 AP%    21.2 CDE    12.66 fluid pack lot #[id number] H  . JOINT REPLACEMENT Left 2014   knee partial replacement  . kidney stent Right 1995    There were no vitals filed for this visit.   Subjective Assessment - 02/29/20 0944    Subjective Patient reports he has been  performing "some of exercises."    Patient is accompained by: Family member    Limitations Walking    Patient Stated Goals Patient reports his goal not to fall and regain some strength in his legs.    Currently in Pain? No/denies             Neuromuscular re-ed   Step ups on 6" step without UE support- no unsteadiness and good ability to perform alternating LE's Static stand on blue airex pad with initally feet narrowed and eyes open for 20 sec- mild unsteadiness noted today.  Side stepping over orange hurdle left to right then right to left. Patient exhibited mild difficulty coordinating movement and mild unsteadiness - did improve with verbal cues for sequencing and practice.   Pt educated throughout session about proper posture and technique with exercises. Improved exercise technique, movement at target joints, use of target muscles after min to mod verbal, visual, tactile cues.  Patient then performed the below seated exercises for 10 reps each leg using green theraband for hip and knee ex and AROM for ankle and sit to stand exercise. Reviewed  the below home program for patient today.    Resistive hip flex Resistive hip abd  Resistive knee ext Active Heel raises Active Toe raises Sit to stand with no UE support  Patient performed well with instructed on how to place the band for each exercise. He required cues to perform slowly and hold contraction for 2 sec.                         PT Education - 02/29/20 1114    Education Details specific exercise techniques    Person(s) Educated Patient    Methods Explanation;Demonstration;Tactile cues;Verbal cues    Comprehension Verbalized understanding;Returned demonstration;Verbal cues required;Need further instruction;Tactile cues required            PT Short Term Goals - 02/17/20 1045      PT SHORT TERM GOAL #1   Title Patient will be independent in home exercise program to improve strength/mobility for  better functional independence with ADLs.    Baseline 02/17/2020- Patient has no formal HEP in place.    Time 6    Period Weeks    Status New    Target Date 03/30/20             PT Long Term Goals - 02/17/20 1219      PT LONG TERM GOAL #1   Title Patient will increase FOTO score to equal to or greater than 69 to demonstrate statistically significant improvement in mobility and quality of life.    Baseline 02/17/2020= 59    Time 12    Period Weeks    Status New    Target Date 05/11/20      PT LONG TERM GOAL #2   Title Patient (> 8 years old) will complete five times sit to stand test in < 15 seconds indicating an increased LE strength and improved balance.    Baseline 02/17/2020- 22 sec with no UE support    Time 12    Period Weeks    Status New    Target Date 05/11/20      PT LONG TERM GOAL #3   Title Pt will improve BERG from 50/56 to 53/56 or higher in order to demonstrate clinically significant improvement in balance.    Baseline 02/17/2020= 50/56    Time 12    Period Weeks    Status New    Target Date 05/11/20      PT LONG TERM GOAL #4   Title Pt will decrease TUG to below 11 seconds/decrease in order to demonstrate decreased fall risk.    Baseline 02/17/20- 13 sec without an assistive device.    Time 12    Period Weeks    Status New    Target Date 05/11/20      PT LONG TERM GOAL #5   Title Patient will demonstrate improved overall ambulation > 800 feet on all surfaces without an assistive device without loss of balance for improved community distances.    Baseline 02/17/2020-Patient reports limited ambulation in [place] community to approx 1 block    Time 12    Period Weeks    Status New    Target Date 05/11/20                 Plan - 02/29/20 1115    Clinical Impression Statement Patient responded well to review of current LE strengthening home program with verbal cues and visual demonstration. He remains challenged at time - sequencing for  balance activities requiring repetitive instruction- mild unsteadiness with lateral step overs and static standing with  feet close with eyes open on airex pad today. He will benefit from skilled PT interventions to continue to progress his strength, balance and mobility to reduce risk of falling and restore indpendent mobility.    Personal Factors and Comorbidities Age;Comorbidity 1    Comorbidities HTN    Examination-Activity Limitations Reach Overhead;Stairs;Dressing;Carry;Locomotion Level    Examination-Participation Restrictions Yard Work    Stability/Clinical Decision Making Stable/Uncomplicated    Rehab Potential Good    PT Frequency 2x / week    PT Duration 12 weeks    PT Treatment/Interventions Cryotherapy;Moist Heat;DME Instruction;Gait training;Stair training;Functional mobility training;Therapeutic activities;Therapeutic exercise;Balance training;Neuromuscular re-education;Patient/family education;Manual techniques;Passive range of motion    PT Next Visit Plan Continue with progressive LE strengthening, balance, gait training.    PT Home Exercise Plan Reviewed home program today.    Consulted and Agree with Plan of Care Patient           Patient will benefit from skilled therapeutic intervention in order to improve the following deficits and impairments:  Abnormal gait,Decreased activity tolerance,Cardiopulmonary status limiting activity,Decreased balance,Decreased endurance,Decreased mobility,Decreased strength,Difficulty walking,Impaired UE functional use  Visit Diagnosis: Muscle weakness (generalized)  Difficulty in walking, not elsewhere classified     Problem List Patient Active Problem List   Diagnosis Date Noted  . Diffuse brain atrophy ([place]) 12/09/2019  . Balance problem 12/03/2019  . Acquired thrombophilia ([place]) 12/03/2019  . Prediabetes 05/29/2019  . Benign prostatic hyperplasia with urinary frequency 08/20/2018  . Hearing aid worn 08/16/2017  . Paronychia of  finger, left 08/16/2017  . Broca's aphasia 08/16/2017  . History of cataract surgery 08/27/2016  . Age-related macular degeneration 08/27/2016  . Counseling regarding end of life decision making 08/27/2016  . Do not resuscitate status 08/27/2016  . BPH associated with nocturia 02/11/2015  . PAF (paroxysmal atrial fibrillation) ([place]) 12/14/2013  . Hyperlipidemia LDL goal <100 12/12/2013  . CVD (cerebrovascular disease) 12/10/2013  . GERD (gastroesophageal reflux disease) 07/25/2013  . Left arm numbness 07/25/2013  . Chronic constipation 01/21/2013  . Encounter for preventive health examination 01/21/2013  . Essential hypertension, benign 10/21/2012  . Glaucoma 10/21/2012  . History of colonic polyps 10/21/2012  . Ureteral stricture, right 10/21/2012    Lewis Moccasin, PT 02/29/2020, 11:23 AM  [place] MAIN [facility] SERVICES [address] [place], [place], [address] Phone: [phone]   Fax:  [phone]  Name: Todd Golden MRN: [id number] Date of Birth: [birth year]

## 2020-03-01 NOTE — Therapy (Addendum)
[place] MAIN [facility] SERVICES [address] [place], [place], [address] Phone: [phone]   Fax:  [phone]  Speech Language Pathology Treatment  Patient Details  Name: Todd Golden MRN: [id number] Date of Birth: 1930-08-21 Referring Provider (SLP): Fara Olden   Encounter Date: 02/29/2020   End of Session - 02/29/20 1722    Visit Number 5    Number of Visits 25    Date for SLP Re-Evaluation 04/25/20    Authorization Type [employer]    Authorization Time Period 02/01/2020 thru 04/25/2020    Authorization - Visit Number 5    Progress Note Due on Visit 10    SLP Start Time 1000    SLP Stop Time  1100    SLP Time Calculation (min) 60 min    Activity Tolerance Patient tolerated treatment well           Past Medical History:  Diagnosis Date  . Allergy   . Arthritis   . Asthma   . Cataract   . Chronic constipation   . Colon polyps   . Cough   . GERD (gastroesophageal reflux disease)   . Glaucoma   . HOH (hard of hearing)    a. Uses hearing aids  . Hyperlipidemia   . Hypertension   . Macular degeneration   . Nephrolithiasis    stent  . PAF (paroxysmal atrial fibrillation) ([place])    a. 11/2013 Dx in setting of L MCA stroke. CHA2DS2VASc = 5-->Eliquis; b. 11/2013 Echo: Nl LV fxn. Mildly dil LA.  . Stroke ([place])    a. 11/2013 L MCA stroke.  Marland Kitchen Ureteral stricture     Past Surgical History:  Procedure Laterality Date  . CATARACT EXTRACTION W/PHACO Left 12/20/2014   Procedure: CATARACT EXTRACTION PHACO AND INTRAOCULAR LENS PLACEMENT (IOC);  Surgeon: Birder Robson, MD;  Location: [facility] ORS;  Service: Ophthalmology;  Laterality: Left;   [place]     00:59.6 AP%    21.2 CDE    12.66 fluid pack lot #[id number] H  . JOINT REPLACEMENT Left 2014   knee partial replacement  . kidney stent Right 1995    There were no vitals filed for this visit.   Subjective Assessment - 02/29/20 1702    Subjective pt pleasant, appropriate,  appeared to struggle completing differents tasks using the same materials    Currently in Pain? No/denies          ST  CLINIC   TX   NOTE                Treatment Data and Patient's Response to Treatment   Skilled treatment session focused on pt's expressive language and dysarthria goals. SLP facilitated session by providing supervision level cues to achieve 100% accuracy with matching printed words. Pt's speech intelligibility while reading words was 100%. When reading object function phrases, pt's speech intelligibility decreased to 90%. SLP further targeted generative naming within the reading of object function phrases by requesting pt to list names of object that fit the function listed. Pt was 90% accurate with improvement to 100% given sentence completion cues.   Pt demonstrates decreased emergent awareness of speech errors. Pt required maximal mulitmodal assistance from ST while completing an awareness building activity to identify speech errors in phrases. With maximal cues, pt was 45% accurate in identifying errors. Pt is unaware of speech errors, therefore reducing his accuracy to perform the task as well as his ability to self-monitor during conversational speech outside of  clinic.     Pt not only demonstrates decreased awareness of speech but he is not aware that he frequently interrupts instruction, is impulsive and he requires multiple instructions and attempts in order to perform the task such as comprehending the matching of same printed words. As such, skilled ST is required to increase pt's ability to increase word finding, speech intelligibility and pt may benefit from a consult to neurologist. He is currently followed by his PCP only. Will continue monitoring for s/s of cognitive deficits.       SLP Education - 02/29/20 1707    Education Details impact that awareness has on motor speech production, need to become aware of errors during speech production    Person(s)  Educated Patient    Methods Explanation    Comprehension Verbal cues required;Tactile cues required;Need further instruction            SLP Short Term Goals - 02/18/20 1254      SLP SHORT TERM GOAL #1   Title With minimal cues, pt will read list of functional phrases at slow rate to reduce dysfluencies to < 10%.    Baseline Maximal cues required    Time 10    Period --   sessions   Status New      SLP SHORT TERM GOAL #2   Title Pt will demonstrate emergent awareness of dysfluencies by self-monitoring and self-correcting dysfluencies with moderate cues.    Baseline decreased awareness    Time 10    Period --   sessions     SLP SHORT TERM GOAL #3   Title With minimal assistance, pt will generate 10 items in a basic category with 80% accuracy.    Baseline new goal    Time 10    Period --   sessions   Status New      SLP SHORT TERM GOAL #4   Title Given moderate cues, pt will complete basic functional written words by writing appropriate letter in blank with 90% accuracy.    Baseline unable    Time 10    Period --   sessions   Status New            SLP Long Term Goals - 02/18/20 1257      SLP LONG TERM GOAL #1   Title Pt will utilize fluency strategies to increase functional communication to participate in everday conversations.    Baseline able to communicate effectively 50% of time    Time 12    Period Weeks    Status New    Target Date 04/25/20      SLP LONG TERM GOAL #2   Title Pt will utilize word finding strategies to convey simple wants and needs in 50% of opportunities.    Baseline new goal    Time 12    Period Weeks    Status New    Target Date 04/25/20      SLP LONG TERM GOAL #3   Title Pt will will be able to write personal information in 50% of opportunities.    Baseline unable to correctly spell, address, zip code or his number    Time 12    Period Weeks    Status New    Target Date 04/25/20            Plan - 02/29/20 1723         Speech Therapy Frequency 2x / week    Duration 12 weeks  Treatment/Interventions Compensatory techniques;SLP instruction and feedback;Patient/family education;Language facilitation;Functional tasks    Potential to Achieve Goals Fair    Potential Considerations Co-morbidities;Medical prognosis;Severity of impairments    SLP Home Exercise Plan provided    Consulted and Agree with Plan of Care Patient           Patient will benefit from skilled therapeutic intervention in order to improve the following deficits and impairments:   Aphasia  Apraxia  Broca's aphasia    Problem List Patient Active Problem List   Diagnosis Date Noted  . Diffuse brain atrophy ([place]) 12/09/2019  . Balance problem 12/03/2019  . Acquired thrombophilia ([place]) 12/03/2019  . Prediabetes 05/29/2019  . Benign prostatic hyperplasia with urinary frequency 08/20/2018  . Hearing aid worn 08/16/2017  . Paronychia of finger, left 08/16/2017  . Broca's aphasia 08/16/2017  . History of cataract surgery 08/27/2016  . Age-related macular degeneration 08/27/2016  . Counseling regarding end of life decision making 08/27/2016  . Do not resuscitate status 08/27/2016  . BPH associated with nocturia 02/11/2015  . PAF (paroxysmal atrial fibrillation) ([place]) 12/14/2013  . Hyperlipidemia LDL goal <100 12/12/2013  . CVD (cerebrovascular disease) 12/10/2013  . GERD (gastroesophageal reflux disease) 07/25/2013  . Left arm numbness 07/25/2013  . Chronic constipation 01/21/2013  . Encounter for preventive health examination 01/21/2013  . Essential hypertension, benign 10/21/2012  . Glaucoma 10/21/2012  . History of colonic polyps 10/21/2012  . Ureteral stricture, right 10/21/2012   Daveah Varone B. Rutherford Nail M.S., CCC-SLP, [place] Pathologist Rehabilitation Services Office [phone]  Stormy Fabian 03/01/2020, 12:39 PM  [place] MAIN [facility] SERVICES [address]  [place], [place], [address] Phone: [phone]   Fax:  [phone]   Name: Todd Golden MRN: [id number] Date of Birth: [birth year]

## 2020-03-02 ENCOUNTER — Ambulatory Visit: Payer: PPO

## 2020-03-02 ENCOUNTER — Ambulatory Visit: Payer: PPO | Admitting: Speech Pathology

## 2020-03-02 ENCOUNTER — Other Ambulatory Visit: Payer: Self-pay

## 2020-03-02 DIAGNOSIS — R482 Apraxia: Secondary | ICD-10-CM

## 2020-03-02 DIAGNOSIS — R4701 Aphasia: Secondary | ICD-10-CM | POA: Diagnosis not present

## 2020-03-02 DIAGNOSIS — R262 Difficulty in walking, not elsewhere classified: Secondary | ICD-10-CM

## 2020-03-02 DIAGNOSIS — M6281 Muscle weakness (generalized): Secondary | ICD-10-CM

## 2020-03-02 NOTE — Patient Instructions (Signed)
Read and self-evaluate fluency of EMOTIONS AND HUMOROUS sentences each day

## 2020-03-02 NOTE — Therapy (Signed)
[place] MAIN [facility] SERVICES [address] [place], [place], [address] Phone: [phone]   Fax:  [phone]  Speech Language Pathology Treatment  Patient Details  Name: Todd Golden MRN: [id number] Date of Birth: [birth year] Referring Provider (SLP): Fara Olden   Encounter Date: 03/02/2020   End of Session - 03/02/20 1444    Visit Number 6    Number of Visits 25    Date for SLP Re-Evaluation 04/25/20    Authorization Type [employer]    Authorization Time Period 02/01/2020 thru 04/25/2020    Authorization - Visit Number 6    Progress Note Due on Visit 10    SLP Start Time 1345    SLP Stop Time  1440    SLP Time Calculation (min) 55 min    Activity Tolerance Patient tolerated treatment well           Past Medical History:  Diagnosis Date  . Allergy   . Arthritis   . Asthma   . Cataract   . Chronic constipation   . Colon polyps   . Cough   . GERD (gastroesophageal reflux disease)   . Glaucoma   . HOH (hard of hearing)    a. Uses hearing aids  . Hyperlipidemia   . Hypertension   . Macular degeneration   . Nephrolithiasis    stent  . PAF (paroxysmal atrial fibrillation) ([place])    a. 11/2013 Dx in setting of L MCA stroke. CHA2DS2VASc = 5-->Eliquis; b. 11/2013 Echo: Nl LV fxn. Mildly dil LA.  . Stroke ([place])    a. 11/2013 L MCA stroke.  Marland Kitchen Ureteral stricture     Past Surgical History:  Procedure Laterality Date  . CATARACT EXTRACTION W/PHACO Left 12/20/2014   Procedure: CATARACT EXTRACTION PHACO AND INTRAOCULAR LENS PLACEMENT (IOC);  Surgeon: Birder Robson, MD;  Location: [facility] ORS;  Service: Ophthalmology;  Laterality: Left;   [place]     00:59.6 AP%    21.2 CDE    12.66 fluid pack lot #[id number] H  . JOINT REPLACEMENT Left 2014   knee partial replacement  . kidney stent Right 1995    There were no vitals filed for this visit.   Subjective Assessment - 03/02/20 1441    Subjective pt plesant, states he is  tired from PT    Currently in Pain? No/denies            Neuro   ST   TX   NOTE          Treatment Data and Patient's Response to Treatment   Skilled treatment session targeted pt's expressive language goals, specifically speech intelligibility. Educiton provided to pt on need to create self-awareness of speech errors. SLP facilitated this by having pt read known riddles and then evaluating whether sentence was smooth by marking yes/no. Audio feedback was also provided to aid pt in recognizing errors. Pt demonstrates awareness of fluency with 91% accuracy. However when producing known riddles, pt was 50% accurate in producing fluent speech. SLP introduced strategy of reading sentence silently, then mouthing the same sentence and finally speaking the sentence. when using this strategy pt achieved 100% fluency. SLP further facilitated by presenting novel sentences with general instruciton to use the above mentioned strategy. Pt produced 8 out of 10 novel sentences with fluency while using this strategy.   While pt was able to demonstrate increased awareness and thus increase fluency at the sentence level, carryover of fluency into session was not evident. Pt's  speech continues to be <25% intelligible in simple conversation d/t phonemic substititions and halting speech d/t word finding deficits. Skilled ST intervention continues to be required to increase pt's ability to communicate basic wants/needs/information thereby increasing his functional independence and reduce caregiver burden.         SLP Education - 03/02/20 1443    Education Details awareness of dysfluency and how to self-monitor    Person(s) Educated Patient    Methods Explanation;Demonstration;Verbal cues;Handout    Comprehension Verbalized understanding;Returned demonstration;Verbal cues required;Need further instruction            SLP Short Term Goals - 02/18/20 1254      SLP SHORT TERM GOAL #1   Title With minimal cues,  pt will read list of functional phrases at slow rate to reduce dysfluencies to < 10%.    Baseline Maximal cues required    Time 10    Period --   sessions   Status New      SLP SHORT TERM GOAL #2   Title Pt will demonstrate emergent awareness of dysfluencies by self-monitoring and self-correcting dysfluencies with moderate cues.    Baseline decreased awareness    Time 10    Period --   sessions     SLP SHORT TERM GOAL #3   Title With minimal assistance, pt will generate 10 items in a basic category with 80% accuracy.    Baseline new goal    Time 10    Period --   sessions   Status New      SLP SHORT TERM GOAL #4   Title Given moderate cues, pt will complete basic functional written words by writing appropriate letter in blank with 90% accuracy.    Baseline unable    Time 10    Period --   sessions   Status New            SLP Long Term Goals - 02/18/20 1257      SLP LONG TERM GOAL #1   Title Pt will utilize fluency strategies to increase functional communication to participate in everday conversations.    Baseline able to communicate effectively 50% of time    Time 12    Period Weeks    Status New    Target Date 04/25/20      SLP LONG TERM GOAL #2   Title Pt will utilize word finding strategies to convey simple wants and needs in 50% of opportunities.    Baseline new goal    Time 12    Period Weeks    Status New    Target Date 04/25/20      SLP LONG TERM GOAL #3   Title Pt will will be able to write personal information in 50% of opportunities.    Baseline unable to correctly spell, address, zip code or his number    Time 12    Period Weeks    Status New    Target Date 04/25/20            Plan - 03/02/20 1444    Speech Therapy Frequency 2x / week    Duration 12 weeks    Treatment/Interventions Compensatory techniques;SLP instruction and feedback;Patient/family education;Language facilitation;Functional tasks    Potential to Achieve Goals Fair     Potential Considerations Co-morbidities;Medical prognosis;Severity of impairments    SLP Home Exercise Plan provided, see instruction section    Consulted and Agree with Plan of Care Patient  Patient will benefit from skilled therapeutic intervention in order to improve the following deficits and impairments:   Aphasia  Apraxia  Broca's aphasia    Problem List Patient Active Problem List   Diagnosis Date Noted  . Diffuse brain atrophy ([place]) 12/09/2019  . Balance problem 12/03/2019  . Acquired thrombophilia ([place]) 12/03/2019  . Prediabetes 05/29/2019  . Benign prostatic hyperplasia with urinary frequency 08/20/2018  . Hearing aid worn 08/16/2017  . Paronychia of finger, left 08/16/2017  . Broca's aphasia 08/16/2017  . History of cataract surgery 08/27/2016  . Age-related macular degeneration 08/27/2016  . Counseling regarding end of life decision making 08/27/2016  . Do not resuscitate status 08/27/2016  . BPH associated with nocturia 02/11/2015  . PAF (paroxysmal atrial fibrillation) ([place]) 12/14/2013  . Hyperlipidemia LDL goal <100 12/12/2013  . CVD (cerebrovascular disease) 12/10/2013  . GERD (gastroesophageal reflux disease) 07/25/2013  . Left arm numbness 07/25/2013  . Chronic constipation 01/21/2013  . Encounter for preventive health examination 01/21/2013  . Essential hypertension, benign 10/21/2012  . Glaucoma 10/21/2012  . History of colonic polyps 10/21/2012  . Ureteral stricture, right 10/21/2012   Cuinn Westerhold B. Rutherford Nail M.S., CCC-SLP, [place] Pathologist Rehabilitation Services Office [phone]  Stormy Fabian 03/02/2020, 3:08 PM  [place] MAIN [facility] SERVICES [address] [place], [place], [address] Phone: [phone]   Fax:  [phone]   Name: Todd Golden MRN: [id number] Date of Birth: 20-Sep-1930

## 2020-03-02 NOTE — Therapy (Signed)
[place] MAIN [facility] SERVICES [address] [place], [place], [address] Phone: [phone]   Fax:  [phone]  Physical Therapy Treatment  Patient Details  Name: Todd Golden MRN: [id number] Date of Birth: [birth year] Referring Provider (PT): Dr. Deborra Medina   Encounter Date: 03/02/2020   PT End of Session - 03/02/20 1307    Visit Number 4    Number of Visits 25    Date for PT Re-Evaluation 05/11/20    Authorization Type 02/17/2020- Initial PT evaluation    PT Start Time 1300    PT Stop Time 1344    PT Time Calculation (min) 44 min    Equipment Utilized During Treatment Gait belt    Activity Tolerance Patient tolerated treatment well    Behavior During Therapy [facility] for tasks assessed/performed           Past Medical History:  Diagnosis Date  . Allergy   . Arthritis   . Asthma   . Cataract   . Chronic constipation   . Colon polyps   . Cough   . GERD (gastroesophageal reflux disease)   . Glaucoma   . HOH (hard of hearing)    a. Uses hearing aids  . Hyperlipidemia   . Hypertension   . Macular degeneration   . Nephrolithiasis    stent  . PAF (paroxysmal atrial fibrillation) ([place])    a. 11/2013 Dx in setting of L MCA stroke. CHA2DS2VASc = 5-->Eliquis; b. 11/2013 Echo: Nl LV fxn. Mildly dil LA.  . Stroke ([place])    a. 11/2013 L MCA stroke.  Marland Kitchen Ureteral stricture     Past Surgical History:  Procedure Laterality Date  . CATARACT EXTRACTION W/PHACO Left 12/20/2014   Procedure: CATARACT EXTRACTION PHACO AND INTRAOCULAR LENS PLACEMENT (IOC);  Surgeon: Birder Robson, MD;  Location: [facility] ORS;  Service: Ophthalmology;  Laterality: Left;   [place]     00:59.6 AP%    21.2 CDE    12.66 fluid pack lot #[id number] H  . JOINT REPLACEMENT Left 2014   knee partial replacement  . kidney stent Right 1995    There were no vitals filed for this visit.   Subjective Assessment - 03/02/20 1305    Subjective Patient reports he has been  performing the exercises with the assistance of his wife. He reports no issues with the handout.    Patient is accompained by: Family member    Limitations Walking    Patient Stated Goals Patient reports his goal not to fall and regain some strength in his legs.    Currently in Pain? No/denies              Neuromuscular re-ed:  Figure four block stepping: initally called out numbers in the block and patient started in block 1 then to 2, 3, 4 respectively with no significant difficulty.  Gait with scanning  Obstacle course Initial course set up 3 cones - figure 8 around followed by forward step up on blue airex pad and then up/over orange hurdle x 4 trials. Patient initially with difficulty understanding direction requiring visual demo but quick to respond correctly and no significant difficulty.  2nd course - same as first yet instructed to perform side stepping.   Step ups- at steps without UE support 2 sets of 10 reps each. Disc steps- 3 hedgehog disc- 1 place in front, one later and one behind- tapping with one LE while static stand on opp LE x 20 reps each leg.  Pt educated throughout session about proper posture and technique with exercises. Improved exercise technique, movement at target joints, use of target muscles after min to mod verbal, visual, tactile cues.                     PT Education - 03/02/20 1306    Education Details Specific exercise technique    Person(s) Educated Patient    Methods Explanation;Demonstration;Tactile cues;Verbal cues    Comprehension Verbalized understanding;Returned demonstration;Verbal cues required;Tactile cues required;Need further instruction            PT Short Term Goals - 02/17/20 1045      PT SHORT TERM GOAL #1   Title Patient will be independent in home exercise program to improve strength/mobility for better functional independence with ADLs.    Baseline 02/17/2020- Patient has no formal HEP in place.     Time 6    Period Weeks    Status New    Target Date 03/30/20             PT Long Term Goals - 02/17/20 1219      PT LONG TERM GOAL #1   Title Patient will increase FOTO score to equal to or greater than 69 to demonstrate statistically significant improvement in mobility and quality of life.    Baseline 02/17/2020= 59    Time 12    Period Weeks    Status New    Target Date 05/11/20      PT LONG TERM GOAL #2   Title Patient (> 42 years old) will complete five times sit to stand test in < 15 seconds indicating an increased LE strength and improved balance.    Baseline 02/17/2020- 22 sec with no UE support    Time 12    Period Weeks    Status New    Target Date 05/11/20      PT LONG TERM GOAL #3   Title Pt will improve BERG from 50/56 to 53/56 or higher in order to demonstrate clinically significant improvement in balance.    Baseline 02/17/2020= 50/56    Time 12    Period Weeks    Status New    Target Date 05/11/20      PT LONG TERM GOAL #4   Title Pt will decrease TUG to below 11 seconds/decrease in order to demonstrate decreased fall risk.    Baseline 02/17/20- 13 sec without an assistive device.    Time 12    Period Weeks    Status New    Target Date 05/11/20      PT LONG TERM GOAL #5   Title Patient will demonstrate improved overall ambulation > 800 feet on all surfaces without an assistive device without loss of balance for improved community distances.    Baseline 02/17/2020-Patient reports limited ambulation in [place] community to approx 1 block    Time 12    Period Weeks    Status New    Target Date 05/11/20                 Plan - 03/02/20 3710    Clinical Impression Statement Patient continues to perform well with balance activites- intially challenged with coordination of obstable course yet much improved with practice including higher level balance today. He will benefit from skilled PT interventions to continue to progress his strength,  balance and mobility to reduce risk of falling and restore indpendent mobility    Personal Factors and Comorbidities  Age;Comorbidity 1    Comorbidities HTN    Examination-Activity Limitations Reach Overhead;Stairs;Dressing;Carry;Locomotion Level    Examination-Participation Restrictions Yard Work    Stability/Clinical Decision Making Stable/Uncomplicated    Rehab Potential Good    PT Frequency 2x / week    PT Duration 12 weeks    PT Treatment/Interventions Cryotherapy;Moist Heat;DME Instruction;Gait training;Stair training;Functional mobility training;Therapeutic activities;Therapeutic exercise;Balance training;Neuromuscular re-education;Patient/family education;Manual techniques;Passive range of motion    PT Next Visit Plan Continue with progressive LE strengthening, balance, gait training.    PT Home Exercise Plan Reviewed home program today.    Consulted and Agree with Plan of Care Patient           Patient will benefit from skilled therapeutic intervention in order to improve the following deficits and impairments:  Abnormal gait,Decreased activity tolerance,Cardiopulmonary status limiting activity,Decreased balance,Decreased endurance,Decreased mobility,Decreased strength,Difficulty walking,Impaired UE functional use  Visit Diagnosis: Muscle weakness (generalized)  Difficulty in walking, not elsewhere classified     Problem List Patient Active Problem List   Diagnosis Date Noted  . Diffuse brain atrophy ([place]) 12/09/2019  . Balance problem 12/03/2019  . Acquired thrombophilia ([place]) 12/03/2019  . Prediabetes 05/29/2019  . Benign prostatic hyperplasia with urinary frequency 08/20/2018  . Hearing aid worn 08/16/2017  . Paronychia of finger, left 08/16/2017  . Broca's aphasia 08/16/2017  . History of cataract surgery 08/27/2016  . Age-related macular degeneration 08/27/2016  . Counseling regarding end of life decision making 08/27/2016  . Do not resuscitate status 08/27/2016   . BPH associated with nocturia 02/11/2015  . PAF (paroxysmal atrial fibrillation) ([place]) 12/14/2013  . Hyperlipidemia LDL goal <100 12/12/2013  . CVD (cerebrovascular disease) 12/10/2013  . GERD (gastroesophageal reflux disease) 07/25/2013  . Left arm numbness 07/25/2013  . Chronic constipation 01/21/2013  . Encounter for preventive health examination 01/21/2013  . Essential hypertension, benign 10/21/2012  . Glaucoma 10/21/2012  . History of colonic polyps 10/21/2012  . Ureteral stricture, right 10/21/2012    Lewis Moccasin, PT 03/03/2020, 9:42 AM  [place] MAIN [facility] SERVICES [address] [place], [place], [address] Phone: [phone]   Fax:  [phone]  Name: Todd Golden MRN: [id number] Date of Birth: 1930/02/24

## 2020-03-07 ENCOUNTER — Ambulatory Visit: Payer: PPO | Admitting: Speech Pathology

## 2020-03-07 ENCOUNTER — Other Ambulatory Visit: Payer: Self-pay

## 2020-03-07 ENCOUNTER — Ambulatory Visit: Payer: PPO

## 2020-03-07 DIAGNOSIS — J3089 Other allergic rhinitis: Secondary | ICD-10-CM | POA: Diagnosis not present

## 2020-03-07 DIAGNOSIS — R4701 Aphasia: Secondary | ICD-10-CM | POA: Diagnosis not present

## 2020-03-07 DIAGNOSIS — J3081 Allergic rhinitis due to animal (cat) (dog) hair and dander: Secondary | ICD-10-CM | POA: Diagnosis not present

## 2020-03-07 DIAGNOSIS — I69923 Fluency disorder following unspecified cerebrovascular disease: Secondary | ICD-10-CM

## 2020-03-07 DIAGNOSIS — M6281 Muscle weakness (generalized): Secondary | ICD-10-CM

## 2020-03-07 DIAGNOSIS — R262 Difficulty in walking, not elsewhere classified: Secondary | ICD-10-CM

## 2020-03-07 DIAGNOSIS — J301 Allergic rhinitis due to pollen: Secondary | ICD-10-CM | POA: Diagnosis not present

## 2020-03-07 NOTE — Therapy (Signed)
[place] MAIN [facility] SERVICES [address] [place], [place], [address] Phone: [phone]   Fax:  [phone]  Physical Therapy Treatment  Patient Details  Name: Todd Golden MRN: [id number] Date of Birth: 12/14/1930 Referring Provider (PT): Dr. Deborra Medina   Encounter Date: 03/07/2020   PT End of Session - 03/07/20 1100    Visit Number 5    Number of Visits 25    Date for PT Re-Evaluation 05/11/20    Authorization Type 02/17/2020- Initial PT evaluation    PT Start Time 1102    PT Stop Time 1144    PT Time Calculation (min) 42 min    Equipment Utilized During Treatment Gait belt    Activity Tolerance Patient tolerated treatment well    Behavior During Therapy [facility] for tasks assessed/performed           Past Medical History:  Diagnosis Date  . Allergy   . Arthritis   . Asthma   . Cataract   . Chronic constipation   . Colon polyps   . Cough   . GERD (gastroesophageal reflux disease)   . Glaucoma   . HOH (hard of hearing)    a. Uses hearing aids  . Hyperlipidemia   . Hypertension   . Macular degeneration   . Nephrolithiasis    stent  . PAF (paroxysmal atrial fibrillation) ([place])    a. 11/2013 Dx in setting of L MCA stroke. CHA2DS2VASc = 5-->Eliquis; b. 11/2013 Echo: Nl LV fxn. Mildly dil LA.  . Stroke ([place])    a. 11/2013 L MCA stroke.  Marland Kitchen Ureteral stricture     Past Surgical History:  Procedure Laterality Date  . CATARACT EXTRACTION W/PHACO Left 12/20/2014   Procedure: CATARACT EXTRACTION PHACO AND INTRAOCULAR LENS PLACEMENT (IOC);  Surgeon: Birder Robson, MD;  Location: [facility] ORS;  Service: Ophthalmology;  Laterality: Left;   [place]     00:59.6 AP%    21.2 CDE    12.66 fluid pack lot #[id number] H  . JOINT REPLACEMENT Left 2014   knee partial replacement  . kidney stent Right 1995    There were no vitals filed for this visit.   Subjective Assessment - 03/07/20 1100    Subjective Patient reports feeling okay with no  complaint of pain or any issues today.    Patient is accompained by: Family member    Limitations Walking    Patient Stated Goals Patient reports his goal not to fall and regain some strength in his legs.    Currently in Pain? No/denies             Neuromuscular re-education:    Gait trainer: 11min on TM with BUE support at 0.68 m/s and time on left side= 49% and time on Right LE = 51%. Most cues per biofeedback/trainer prompted on increased step length.   Next measured patient walking speed (10MWT)= 0.81 m/s and then readjusted speed and patient then ambulated another 2 min on Gait trainer (Biodex TM) at 0.82 m/s with similar results as far as time on each leg (51% on R and 49% on L).   Resistive gait with 12.5# forward and backward x 10 times each- initially with very short step to steps but did improve overall with more reciprocal steps with VCs and practice.   Therex:   Leg press 55# BLE 2 sets of 12 reps with initial poor eccentric control- did improve with VC's and practice and patient reported fatigue but no pain or  any other issues.   Standing calf raises x 15 reps with cues to keep knees straight as possible.   Pt educated throughout session about proper posture and technique with exercises. Improved exercise technique, movement at target joints, use of target muscles after min to mod verbal, visual, tactile cues.                        PT Education - 03/08/20 1721    Education Details Educated in strengthening and balance exercises today.    Person(s) Educated Patient    Methods Explanation;Demonstration;Tactile cues;Verbal cues    Comprehension Verbalized understanding;Returned demonstration;Tactile cues required;Need further instruction;Verbal cues required            PT Short Term Goals - 02/17/20 1045      PT SHORT TERM GOAL #1   Title Patient will be independent in home exercise program to improve strength/mobility for better functional  independence with ADLs.    Baseline 02/17/2020- Patient has no formal HEP in place.    Time 6    Period Weeks    Status New    Target Date 03/30/20             PT Long Term Goals - 02/17/20 1219      PT LONG TERM GOAL #1   Title Patient will increase FOTO score to equal to or greater than 69 to demonstrate statistically significant improvement in mobility and quality of life.    Baseline 02/17/2020= 59    Time 12    Period Weeks    Status New    Target Date 05/11/20      PT LONG TERM GOAL #2   Title Patient (> 85 years old) will complete five times sit to stand test in < 15 seconds indicating an increased LE strength and improved balance.    Baseline 02/17/2020- 22 sec with no UE support    Time 12    Period Weeks    Status New    Target Date 05/11/20      PT LONG TERM GOAL #3   Title Pt will improve BERG from 50/56 to 53/56 or higher in order to demonstrate clinically significant improvement in balance.    Baseline 02/17/2020= 50/56    Time 12    Period Weeks    Status New    Target Date 05/11/20      PT LONG TERM GOAL #4   Title Pt will decrease TUG to below 11 seconds/decrease in order to demonstrate decreased fall risk.    Baseline 02/17/20- 13 sec without an assistive device.    Time 12    Period Weeks    Status New    Target Date 05/11/20      PT LONG TERM GOAL #5   Title Patient will demonstrate improved overall ambulation > 800 feet on all surfaces without an assistive device without loss of balance for improved community distances.    Baseline 02/17/2020-Patient reports limited ambulation in [place] community to approx 1 block    Time 12    Period Weeks    Status New    Target Date 05/11/20                 Plan - 03/07/20 1722    Clinical Impression Statement Patient performed well with strengthening and balance activities- able to respond to cues well without significant unsteadiness or difficulty throughout treatment. Patient progressing  well with overall strength and walked  well on TM with good symmetry with gait today. He will benefit from skilled PT interventions to continue to progress his strength, balance and mobility to reduce risk of falling and restore indpendent mobility    Personal Factors and Comorbidities Age;Comorbidity 1    Comorbidities HTN    Examination-Activity Limitations Reach Overhead;Stairs;Dressing;Carry;Locomotion Level    Examination-Participation Restrictions Yard Work    Stability/Clinical Decision Making Stable/Uncomplicated    Rehab Potential Good    PT Frequency 2x / week    PT Duration 12 weeks    PT Treatment/Interventions Cryotherapy;Moist Heat;DME Instruction;Gait training;Stair training;Functional mobility training;Therapeutic activities;Therapeutic exercise;Balance training;Neuromuscular re-education;Patient/family education;Manual techniques;Passive range of motion    PT Next Visit Plan Continue with progressive LE strengthening, balance, gait training.    Consulted and Agree with Plan of Care Patient           Patient will benefit from skilled therapeutic intervention in order to improve the following deficits and impairments:  Abnormal gait,Decreased activity tolerance,Cardiopulmonary status limiting activity,Decreased balance,Decreased endurance,Decreased mobility,Decreased strength,Difficulty walking,Impaired UE functional use  Visit Diagnosis: Muscle weakness (generalized)  Difficulty in walking, not elsewhere classified     Problem List Patient Active Problem List   Diagnosis Date Noted  . Diffuse brain atrophy ([place]) 12/09/2019  . Balance problem 12/03/2019  . Acquired thrombophilia ([place]) 12/03/2019  . Prediabetes 05/29/2019  . Benign prostatic hyperplasia with urinary frequency 08/20/2018  . Hearing aid worn 08/16/2017  . Paronychia of finger, left 08/16/2017  . Broca's aphasia 08/16/2017  . History of cataract surgery 08/27/2016  . Age-related macular degeneration  08/27/2016  . Counseling regarding end of life decision making 08/27/2016  . Do not resuscitate status 08/27/2016  . BPH associated with nocturia 02/11/2015  . PAF (paroxysmal atrial fibrillation) ([place]) 12/14/2013  . Hyperlipidemia LDL goal <100 12/12/2013  . CVD (cerebrovascular disease) 12/10/2013  . GERD (gastroesophageal reflux disease) 07/25/2013  . Left arm numbness 07/25/2013  . Chronic constipation 01/21/2013  . Encounter for preventive health examination 01/21/2013  . Essential hypertension, benign 10/21/2012  . Glaucoma 10/21/2012  . History of colonic polyps 10/21/2012  . Ureteral stricture, right 10/21/2012    Lewis Moccasin, PT 03/08/2020, 5:27 PM  [place] MAIN [facility] SERVICES [address] [place], [place], [address] Phone: [phone]   Fax:  [phone]  Name: Todd Golden MRN: [id number] Date of Birth: 1930/06/28

## 2020-03-07 NOTE — Therapy (Signed)
[place] MAIN [facility] SERVICES [address] [place], [place], [address] Phone: [phone]   Fax:  [phone]  Speech Language Pathology Treatment  Patient Details  Name: Todd Golden MRN: [id number] Date of Birth: 1930/12/19 Referring Provider (SLP): Fara Olden   Encounter Date: 03/07/2020   End of Session - 03/07/20 1546    Visit Number 7    Number of Visits 25    Date for SLP Re-Evaluation 04/25/20    Authorization Type [employer]    Authorization Time Period 02/01/2020 thru 04/25/2020    Authorization - Visit Number 7    Progress Note Due on Visit 10    SLP Start Time 1005    SLP Stop Time  1105    SLP Time Calculation (min) 60 min    Activity Tolerance Patient tolerated treatment well           Past Medical History:  Diagnosis Date  . Allergy   . Arthritis   . Asthma   . Cataract   . Chronic constipation   . Colon polyps   . Cough   . GERD (gastroesophageal reflux disease)   . Glaucoma   . HOH (hard of hearing)    a. Uses hearing aids  . Hyperlipidemia   . Hypertension   . Macular degeneration   . Nephrolithiasis    stent  . PAF (paroxysmal atrial fibrillation) ([place])    a. 11/2013 Dx in setting of L MCA stroke. CHA2DS2VASc = 5-->Eliquis; b. 11/2013 Echo: Nl LV fxn. Mildly dil LA.  . Stroke ([place])    a. 11/2013 L MCA stroke.  Marland Kitchen Ureteral stricture     Past Surgical History:  Procedure Laterality Date  . CATARACT EXTRACTION W/PHACO Left 12/20/2014   Procedure: CATARACT EXTRACTION PHACO AND INTRAOCULAR LENS PLACEMENT (IOC);  Surgeon: Birder Robson, MD;  Location: [facility] ORS;  Service: Ophthalmology;  Laterality: Left;   [place]     00:59.6 AP%    21.2 CDE    12.66 fluid pack lot #[id number] H  . JOINT REPLACEMENT Left 2014   knee partial replacement  . kidney stent Right 1995    There were no vitals filed for this visit.   Subjective Assessment - 03/07/20 1539    Subjective pt pleasant    Patient  is accompained by: Family member    Currently in Pain? No/denies          Neuro   ST   TX   NOTE          Treatment Data and Patient's Response to Treatment   Skilled treatment session targeted pt's expressive language goals, specifically awareness and pt education. SLP facilitated session by having pt read known "Why as Why" jokes. Audio feedback was also provided to aid pt in recognizing errors. Pt unable to recall strategies used to facilitate fluency and demonstrated awareness of fluency with 40% accuracy. When provided with maximal cues and strategy of reading sentence silently, then mouthing the same sentence and finally speaking the sentence, pt increased awareness to 50% accuracy. Pt unable to understand reasoning behind building awareness. Therefore, SLP provided pt and pt's wife with education regarding awareness building and how it impacts speech production as well as discussing the results of reading a novel vs speech production. Pt's wife then voiced pt has been dropping items ~2-3 months ago d/t decreased strength and had been seen by a neurologist at [facility]; however pt believed his hands were "numb" and that it was nerve  related. Pt's wife also stated pt has fallen at least 4 times in the last 6 months (shower, garage, shed, and house) and previously had speech problems after his first stroke in 2015, though the problems would occur a few times a week. Given this report, would recommend consulting with neurology for further evaluation and imaging. When providing education, pt demonstrated no recall and was off topic further indicating a lack of understanding of the concepts discussed. Pt's speech continues to be <25% intelligible in simple conversation d/t phonemic substitutions and halting speech d/t word finding deficits. Skilled ST intervention continues to be required to increase pt's ability to communicate basic wants/needs/information thereby increasing his functional independence and  reduce caregiver burden.       ADULT SLP TREATMENT - 03/07/20 0001      General Information   Behavior/Cognition Alert;Cooperative;Pleasant mood    HPI Pt is a 87 year old male who has sustained several falls with most recent fall ~ 3 months ago. Pt suffered a severe fall striking his right side of head on the wooden boarder of a raised garden. Head CT on 12/09/2019 was negative for acute abnormality and revealed progression of atrophy and chronic ischemic changes in the white matter and chronic infarct in the left frontal lobe. 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 Pt's speech-language abilities returned without ST intervention. While pt's head CT was negative after most recent fall, his speech was immediately dysfluent with anomia that have not resolved. As a result, Outpatient ST services were ordered.      Treatment Provided   Treatment provided Cognitive-Linquistic      Pain Assessment   Pain Assessment No/denies pain      Cognitive-Linquistic Treatment   Treatment focused on Apraxia;Aphasia;Patient/family/caregiver education    Skilled Treatment Maximal assistance to build awareness during phrase level language based activities      Assessment / Recommendations / [place] with current plan of care      Progression Toward Goals   Progression toward goals Progressing toward goals            SLP Education - 03/07/20 1542    Education Details regarding reason for building awareness, need for neurologist consult    Person(s) Educated Patient;Spouse    Methods Explanation;Demonstration;Verbal cues;Handout;Tactile cues    Comprehension Verbalized understanding;Returned demonstration;Verbal cues  required;Need further instruction            SLP Short Term Goals - 02/18/20 1254      SLP SHORT TERM GOAL #1   Title With minimal cues, pt will read list of functional phrases at slow rate to reduce dysfluencies to < 10%.    Baseline Maximal cues required    Time 10    Period --   sessions   Status New      SLP SHORT TERM GOAL #2   Title Pt will demonstrate emergent awareness of dysfluencies by self-monitoring and self-correcting dysfluencies with moderate cues.    Baseline decreased awareness    Time 10    Period --   sessions     SLP SHORT TERM GOAL #3   Title With minimal assistance, pt will generate 10 items in a basic category with 80% accuracy.  Baseline new goal    Time 10    Period --   sessions   Status New      SLP SHORT TERM GOAL #4   Title Given moderate cues, pt will complete basic functional written words by writing appropriate letter in blank with 90% accuracy.    Baseline unable    Time 10    Period --   sessions   Status New            SLP Long Term Goals - 02/18/20 1257      SLP LONG TERM GOAL #1   Title Pt will utilize fluency strategies to increase functional communication to participate in everday conversations.    Baseline able to communicate effectively 50% of time    Time 12    Period Weeks    Status New    Target Date 04/25/20      SLP LONG TERM GOAL #2   Title Pt will utilize word finding strategies to convey simple wants and needs in 50% of opportunities.    Baseline new goal    Time 12    Period Weeks    Status New    Target Date 04/25/20      SLP LONG TERM GOAL #3   Title Pt will will be able to write personal information in 50% of opportunities.    Baseline unable to correctly spell, address, zip code or his number    Time 12    Period Weeks    Status New    Target Date 04/25/20            Plan - 03/07/20 1546    Speech Therapy Frequency 2x / week    Duration 12 weeks    Treatment/Interventions Compensatory  techniques;SLP instruction and feedback;Patient/family education;Language facilitation;Functional tasks;Internal/external aids    Potential to Achieve Goals Fair    Potential Considerations Co-morbidities;Medical prognosis;Severity of impairments    Consulted and Agree with Plan of Care Patient;Family member/caregiver    Family Member Consulted pt's wife           Patient will benefit from skilled therapeutic intervention in order to improve the following deficits and impairments:   Fluency disorder following unspecified cerebrovascular disease    Problem List Patient Active Problem List   Diagnosis Date Noted  . Diffuse brain atrophy ([place]) 12/09/2019  . Balance problem 12/03/2019  . Acquired thrombophilia ([place]) 12/03/2019  . Prediabetes 05/29/2019  . Benign prostatic hyperplasia with urinary frequency 08/20/2018  . Hearing aid worn 08/16/2017  . Paronychia of finger, left 08/16/2017  . Broca's aphasia 08/16/2017  . History of cataract surgery 08/27/2016  . Age-related macular degeneration 08/27/2016  . Counseling regarding end of life decision making 08/27/2016  . Do not resuscitate status 08/27/2016  . BPH associated with nocturia 02/11/2015  . PAF (paroxysmal atrial fibrillation) ([place]) 12/14/2013  . Hyperlipidemia LDL goal <100 12/12/2013  . CVD (cerebrovascular disease) 12/10/2013  . GERD (gastroesophageal reflux disease) 07/25/2013  . Left arm numbness 07/25/2013  . Chronic constipation 01/21/2013  . Encounter for preventive health examination 01/21/2013  . Essential hypertension, benign 10/21/2012  . Glaucoma 10/21/2012  . History of colonic polyps 10/21/2012  . Ureteral stricture, right 10/21/2012   Gabrielle Dare, Student-SLP  Gabrielle Dare 03/07/2020, 3:49 PM  [place] MAIN [facility] SERVICES [address] [place], [place], [address] Phone: [phone]   Fax:  [phone]   Name: LE FAULCON MRN:  [id number] Date of Birth:  09/06/1930 

## 2020-03-09 ENCOUNTER — Other Ambulatory Visit: Payer: Self-pay | Admitting: Neurology

## 2020-03-09 ENCOUNTER — Ambulatory Visit: Payer: PPO | Admitting: Speech Pathology

## 2020-03-09 ENCOUNTER — Ambulatory Visit: Payer: PPO

## 2020-03-09 ENCOUNTER — Other Ambulatory Visit: Payer: Self-pay

## 2020-03-09 DIAGNOSIS — R4701 Aphasia: Secondary | ICD-10-CM

## 2020-03-09 DIAGNOSIS — I69923 Fluency disorder following unspecified cerebrovascular disease: Secondary | ICD-10-CM

## 2020-03-09 DIAGNOSIS — R262 Difficulty in walking, not elsewhere classified: Secondary | ICD-10-CM

## 2020-03-09 DIAGNOSIS — M6281 Muscle weakness (generalized): Secondary | ICD-10-CM

## 2020-03-09 DIAGNOSIS — R482 Apraxia: Secondary | ICD-10-CM

## 2020-03-09 NOTE — Therapy (Signed)
[place] MAIN [facility] SERVICES [address] [place], [place], [address] Phone: [phone]   Fax:  [phone]  Physical Therapy Treatment  Patient Details  Name: Todd Golden MRN: [id number] Date of Birth: 1930/04/18 Referring Provider (PT): Dr. Deborra Medina   Encounter Date: 03/09/2020   PT End of Session - 03/09/20 1109    Visit Number 6    Number of Visits 25    Date for PT Re-Evaluation 05/11/20    Authorization Type 02/17/2020- Initial PT evaluation    PT Start Time 1103    PT Stop Time 1144    PT Time Calculation (min) 41 min    Equipment Utilized During Treatment Gait belt    Activity Tolerance Patient tolerated treatment well    Behavior During Therapy WFL for tasks assessed/performed           Past Medical History:  Diagnosis Date   Allergy    Arthritis    Asthma    Cataract    Chronic constipation    Colon polyps    Cough    GERD (gastroesophageal reflux disease)    Glaucoma    HOH (hard of hearing)    a. Uses hearing aids   Hyperlipidemia    Hypertension    Macular degeneration    Nephrolithiasis    stent   PAF (paroxysmal atrial fibrillation) ([place])    a. 11/2013 Dx in setting of L MCA stroke. CHA2DS2VASc = 5-->Eliquis; b. 11/2013 Echo: Nl LV fxn. Mildly dil LA.   Stroke [facility])    a. 11/2013 L MCA stroke.   Ureteral stricture     Past Surgical History:  Procedure Laterality Date   CATARACT EXTRACTION W/PHACO Left 12/20/2014   Procedure: CATARACT EXTRACTION PHACO AND INTRAOCULAR LENS PLACEMENT (IOC);  Surgeon: Birder Robson, MD;  Location: [facility] ORS;  Service: Ophthalmology;  Laterality: Left;   [place]     00:59.6 AP%    21.2 CDE    12.66 fluid pack lot #[id number]   JOINT REPLACEMENT Left 2014   knee partial replacement   kidney stent Right 1995    There were no vitals filed for this visit.   Subjective Assessment - 03/09/20 1107    Subjective Patient reports he has been walking  some outdoors but states "I still feel like a little old man when I walk."    Patient is accompained by: Family member    Limitations Walking    Patient Stated Goals Patient reports his goal not to fall and regain some strength in his legs.    Currently in Pain? No/denies          Neuromuscular re-ed:   Side step over 2 orange hurdles- Left to right then right to left x 10 trials each side. Forward step over 2 oragnge hurdles x 10 reps Side step up and over 6" step box x10 reps Front step up onto 6" box then down x 10 reps  Leg press at 55 # 3 sets of 12 reps- Min assist to position into correct position.   Resistive gait Forward at 17.5 # x 5 - CGA for balance- improved to SBA Resistive gait backward at 12.5# x5 -Patient presented with decreased step length going backward and unable to correct with VCs  Backward gait using floor ladder drill- Patient with difficulty walking backward - unable to step into next square- minimal step length.   Sit to stand without UE support x10 -VC to sit toward edge of  seat- VC to lean forward and keep head up. Patient able to rise today on his own without UE support.   Pt educated throughout session about proper posture and technique with exercises. Improved exercise technique, movement at target joints, use of target muscles after min to mod verbal, visual, tactile cues.                            PT Education - 03/09/20 1108    Education Details specific gait and exercise cues.    Person(s) Educated Patient    Methods Explanation;Demonstration;Tactile cues;Verbal cues;Handout    Comprehension Verbalized understanding;Returned demonstration;Verbal cues required;Tactile cues required;Need further instruction            PT Short Term Goals - 02/17/20 1045      PT SHORT TERM GOAL #1   Title Patient will be independent in home exercise program to improve strength/mobility for better functional independence with ADLs.     Baseline 02/17/2020- Patient has no formal HEP in place.    Time 6    Period Weeks    Status New    Target Date 03/30/20             PT Long Term Goals - 02/17/20 1219      PT LONG TERM GOAL #1   Title Patient will increase FOTO score to equal to or greater than 69 to demonstrate statistically significant improvement in mobility and quality of life.    Baseline 02/17/2020= 59    Time 12    Period Weeks    Status New    Target Date 05/11/20      PT LONG TERM GOAL #2   Title Patient (> 86 years old) will complete five times sit to stand test in < 15 seconds indicating an increased LE strength and improved balance.    Baseline 02/17/2020- 22 sec with no UE support    Time 12    Period Weeks    Status New    Target Date 05/11/20      PT LONG TERM GOAL #3   Title Pt will improve BERG from 50/56 to 53/56 or higher in order to demonstrate clinically significant improvement in balance.    Baseline 02/17/2020= 50/56    Time 12    Period Weeks    Status New    Target Date 05/11/20      PT LONG TERM GOAL #4   Title Pt will decrease TUG to below 11 seconds/decrease in order to demonstrate decreased fall risk.    Baseline 02/17/20- 13 sec without an assistive device.    Time 12    Period Weeks    Status New    Target Date 05/11/20      PT LONG TERM GOAL #5   Title Patient will demonstrate improved overall ambulation > 800 feet on all surfaces without an assistive device without loss of balance for improved community distances.    Baseline 02/17/2020-Patient reports limited ambulation in [place] to approx 1 block    Time 12    Period Weeks    Status New    Target Date 05/11/20                 Plan - 03/09/20 2105    Clinical Impression Statement Patient continues to perform well with balance and strengthening exercises. He is able to follow all commands without difficulty and coordinating his movements well. He did experience  difficulty with walking  backward minimal step length. He will benefit from skilled PT interventions to continue to progress his strength, balance and mobility to reduce risk of falling and restore indpendent mobility    Personal Factors and Comorbidities Age;Comorbidity 1    Comorbidities HTN    Examination-Activity Limitations Reach Overhead;Stairs;Dressing;Carry;Locomotion Level    Examination-Participation Restrictions Yard Work    Stability/Clinical Decision Making Stable/Uncomplicated    Rehab Potential Good    PT Frequency 2x / week    PT Duration 12 weeks    PT Treatment/Interventions Cryotherapy;Moist Heat;DME Instruction;Gait training;Stair training;Functional mobility training;Therapeutic activities;Therapeutic exercise;Balance training;Neuromuscular re-education;Patient/family education;Manual techniques;Passive range of motion    PT Next Visit Plan Continue with progressive LE strengthening, balance, gait training.    Consulted and Agree with Plan of Care Patient           Patient will benefit from skilled therapeutic intervention in order to improve the following deficits and impairments:  Abnormal gait,Decreased activity tolerance,Cardiopulmonary status limiting activity,Decreased balance,Decreased endurance,Decreased mobility,Decreased strength,Difficulty walking,Impaired UE functional use  Visit Diagnosis: Muscle weakness (generalized)  Difficulty in walking, not elsewhere classified     Problem List Patient Active Problem List   Diagnosis Date Noted   Diffuse brain atrophy ([place]) 12/09/2019   Balance problem 12/03/2019   Acquired thrombophilia ([place]) 12/03/2019   Prediabetes 05/29/2019   Benign prostatic hyperplasia with urinary frequency 08/20/2018   Hearing aid worn 08/16/2017   Paronychia of finger, left 08/16/2017   Broca's aphasia 08/16/2017   History of cataract surgery 08/27/2016   Age-related macular degeneration 08/27/2016   Counseling regarding end of life decision  making 08/27/2016   Do not resuscitate status 08/27/2016   BPH associated with nocturia 02/11/2015   PAF (paroxysmal atrial fibrillation) ([place]) 12/14/2013   Hyperlipidemia LDL goal <100 12/12/2013   CVD (cerebrovascular disease) 12/10/2013   GERD (gastroesophageal reflux disease) 07/25/2013   Left arm numbness 07/25/2013   Chronic constipation 01/21/2013   Encounter for preventive health examination 01/21/2013   Essential hypertension, benign 10/21/2012   Glaucoma 10/21/2012   History of colonic polyps 10/21/2012   Ureteral stricture, right 10/21/2012    Lewis Moccasin, PT 03/10/2020, 11:55 AM  [place], [place], [address] Phone: [phone]   Fax:  [phone]  Name: Todd Golden MRN: [id number] Date of Birth: 26-Apr-1930

## 2020-03-09 NOTE — Therapy (Signed)
[place] MAIN [facility] SERVICES [address] [place], [place], [address] Phone: [phone]   Fax:  [phone]  Speech Language Pathology Treatment  Patient Details  Name: Todd Golden MRN: [id number] Date of Birth: July 08, 1930 Referring Provider (SLP): Fara Olden   Encounter Date: 03/09/2020   End of Session - 03/09/20 1157    Visit Number 8    Number of Visits 25    Date for SLP Re-Evaluation 04/25/20    Authorization Type [employer]    Authorization Time Period 02/01/2020 thru 04/25/2020    Authorization - Visit Number 8    Progress Note Due on Visit 10    SLP Start Time 1000    SLP Stop Time  1100    SLP Time Calculation (min) 60 min    Activity Tolerance Patient tolerated treatment well           Past Medical History:  Diagnosis Date  . Allergy   . Arthritis   . Asthma   . Cataract   . Chronic constipation   . Colon polyps   . Cough   . GERD (gastroesophageal reflux disease)   . Glaucoma   . HOH (hard of hearing)    a. Uses hearing aids  . Hyperlipidemia   . Hypertension   . Macular degeneration   . Nephrolithiasis    stent  . PAF (paroxysmal atrial fibrillation) ([place])    a. 11/2013 Dx in setting of L MCA stroke. CHA2DS2VASc = 5-->Eliquis; b. 11/2013 Echo: Nl LV fxn. Mildly dil LA.  . Stroke ([place])    a. 11/2013 L MCA stroke.  Marland Kitchen Ureteral stricture     Past Surgical History:  Procedure Laterality Date  . CATARACT EXTRACTION W/PHACO Left 12/20/2014   Procedure: CATARACT EXTRACTION PHACO AND INTRAOCULAR LENS PLACEMENT (IOC);  Surgeon: Birder Robson, MD;  Location: [facility] ORS;  Service: Ophthalmology;  Laterality: Left;   [place]     00:59.6 AP%    21.2 CDE    12.66 fluid pack lot #[id number]  . JOINT REPLACEMENT Left 2014   knee partial replacement  . kidney stent Right 1995    There were no vitals filed for this visit.   Subjective Assessment - 03/09/20 1152    Subjective pt pleasant, arrived to  session with a list of authors he enjoys    Currently in Pain? No/denies          Neuro   ST   TX   NOTE          Treatment Data and Patient's Response to Treatment   Skilled treatment session targeted pt's expressive language goals, specifically pt's writing and spelling. Pt arrived to session with a list of authors he enjoys, stating he has tried reading books aloud to his wife. SLP provided education on why reading books aloud are difficult with his expressive language deficits. SLP facilitated session by having pt complete word search by saying the word aloud and finding the hidden word. Pt with decreased sustained attention during task. Pt would locate 2-3 words before making off-topic comments such as "My wife's sister is 78". SLP then introduced pt to writing and spelling activity using the [facility] workbook. Pt with maximal faded to moderate cues to identify word, audibly spell word, fill in missing letter, and spell word in its entirety. Pt struggled to understand concept for spelling common words stating "Why do I ned to be able to spell these words?" and "It's not something that I  have to worry about". SLP highlighted the importance of spelling and its impact on speech production by having pt spell out loud 25 common items provided in the [facility] workbook. Pt accurately spelled 8/25 words. SLP provided further pt education; however pt continued to lack understanding of the concepts discussed. Skilled ST intervention continues to be required to increase pt's ability to communicate basic wants/needs/information thereby increasing his functional independence and reduce caregiver burden.       ADULT SLP TREATMENT - 03/09/20 0001      General Information   Behavior/Cognition Alert;Cooperative;Pleasant mood    HPI Pt is a 87 year old male who has sustained several falls with most recent fall ~ 3 months ago. Pt suffered a severe fall striking his right side of head on the wooden boarder of a raised  garden. Head CT on 12/09/2019 was negative for acute abnormality and revealed progression of atrophy and chronic ischemic changes in the white matter and chronic infarct in the left frontal lobe. 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 Pt's speech-language abilities returned without ST intervention. While pt's head CT was negative after most recent fall, his speech was immediately dysfluent with anomia that have not resolved. As a result, Outpatient ST services were ordered.      Treatment Provided   Treatment provided Cognitive-Linquistic      Pain Assessment   Pain Assessment No/denies pain      Cognitive-Linquistic Treatment   Treatment focused on Apraxia;Aphasia;Patient/family/caregiver education      Assessment / Recommendations / Plan   Plan Continue with current plan of care      Progression Toward Goals   Progression toward goals Progressing toward goals            SLP Education - 03/09/20 1157    Education Details impact that spelling has on speech production    Person(s) Educated Patient    Methods Explanation;Demonstration;Verbal cues;Handout    Comprehension Verbalized understanding;Returned demonstration;Verbal cues required;Need further instruction            SLP Short Term Goals - 02/18/20 1254      SLP SHORT TERM GOAL #1   Title With minimal cues, pt will read list of functional phrases at slow rate to reduce dysfluencies to < 10%.    Baseline Maximal cues required    Time 10    Period --   sessions   Status New      SLP SHORT TERM GOAL #2   Title Pt will demonstrate emergent awareness of dysfluencies by self-monitoring and self-correcting dysfluencies with moderate cues.    Baseline  decreased awareness    Time 10    Period --   sessions     SLP SHORT TERM GOAL #3   Title With minimal assistance, pt will generate 10 items in a basic category with 80% accuracy.    Baseline new goal    Time 10    Period --   sessions   Status New      SLP SHORT TERM GOAL #4   Title Given moderate cues, pt will complete basic functional written words by writing appropriate letter in blank with 90% accuracy.    Baseline unable    Time 10  Period --   sessions   Status New            SLP Long Term Goals - 02/18/20 1257      SLP LONG TERM GOAL #1   Title Pt will utilize fluency strategies to increase functional communication to participate in everday conversations.    Baseline able to communicate effectively 50% of time    Time 12    Period Weeks    Status New    Target Date 04/25/20      SLP LONG TERM GOAL #2   Title Pt will utilize word finding strategies to convey simple wants and needs in 50% of opportunities.    Baseline new goal    Time 12    Period Weeks    Status New    Target Date 04/25/20      SLP LONG TERM GOAL #3   Title Pt will will be able to write personal information in 50% of opportunities.    Baseline unable to correctly spell, address, zip code or his number    Time 12    Period Weeks    Status New    Target Date 04/25/20            Plan - 03/09/20 1158    Speech Therapy Frequency 2x / week    Duration 12 weeks    Treatment/Interventions Compensatory techniques;SLP instruction and feedback;Patient/family education;Language facilitation;Functional tasks;Internal/external aids    Potential to Achieve Goals Fair    Potential Considerations Co-morbidities;Medical prognosis;Severity of impairments    Consulted and Agree with Plan of Care Patient           Patient will benefit from skilled therapeutic intervention in order to improve the following deficits and impairments:   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 Diffuse brain atrophy ([place]) 12/09/2019  . Balance problem 12/03/2019  . Acquired thrombophilia ([place]) 12/03/2019  . Prediabetes 05/29/2019  . Benign prostatic hyperplasia with urinary frequency 08/20/2018  . Hearing aid worn 08/16/2017  . Paronychia of finger, left 08/16/2017  . Broca's aphasia 08/16/2017  . History of cataract surgery 08/27/2016  . Age-related macular degeneration 08/27/2016  . Counseling regarding end of life decision making 08/27/2016  . Do not resuscitate status 08/27/2016  . BPH associated with nocturia 02/11/2015  . PAF (paroxysmal atrial fibrillation) ([place]) 12/14/2013  . Hyperlipidemia LDL goal <100 12/12/2013  . CVD (cerebrovascular disease) 12/10/2013  . GERD (gastroesophageal reflux disease) 07/25/2013  . Left arm numbness 07/25/2013  . Chronic constipation 01/21/2013  . Encounter for preventive health examination 01/21/2013  . Essential hypertension, benign 10/21/2012  . Glaucoma 10/21/2012  . History of colonic polyps 10/21/2012  . Ureteral stricture, right 10/21/2012   Gabrielle Dare, Student-SLP  Gabrielle Dare 03/09/2020, 12:00 PM  [place] MAIN [facility] SERVICES [address] [place], [place], [address] Phone: [phone]   Fax:  [phone]   Name: ALIEU FINNIGAN MRN: [id number] Date of Birth: 15-May-1930

## 2020-03-14 ENCOUNTER — Ambulatory Visit: Payer: PPO | Admitting: Speech Pathology

## 2020-03-14 ENCOUNTER — Ambulatory Visit: Payer: PPO

## 2020-03-14 ENCOUNTER — Other Ambulatory Visit: Payer: Self-pay

## 2020-03-14 DIAGNOSIS — R41841 Cognitive communication deficit: Secondary | ICD-10-CM

## 2020-03-14 DIAGNOSIS — R4701 Aphasia: Secondary | ICD-10-CM | POA: Diagnosis not present

## 2020-03-14 DIAGNOSIS — J301 Allergic rhinitis due to pollen: Secondary | ICD-10-CM | POA: Diagnosis not present

## 2020-03-14 DIAGNOSIS — M6281 Muscle weakness (generalized): Secondary | ICD-10-CM

## 2020-03-14 DIAGNOSIS — G319 Degenerative disease of nervous system, unspecified: Secondary | ICD-10-CM

## 2020-03-14 DIAGNOSIS — R482 Apraxia: Secondary | ICD-10-CM

## 2020-03-14 DIAGNOSIS — J3089 Other allergic rhinitis: Secondary | ICD-10-CM | POA: Diagnosis not present

## 2020-03-14 DIAGNOSIS — R262 Difficulty in walking, not elsewhere classified: Secondary | ICD-10-CM

## 2020-03-14 NOTE — Therapy (Signed)
[place] MAIN [facility] SERVICES [address] [place], [place], [address] Phone: [phone]   Fax:  [phone]  Speech Language Pathology Treatment  Patient Details  Name: Todd Golden MRN: [id number] Date of Birth: Mar 23, 1930 Referring Provider (SLP): Fara Olden   Encounter Date: 03/14/2020   End of Session - 03/14/20 1806    Visit Number 9    Number of Visits 25    Date for SLP Re-Evaluation 04/25/20    Authorization Type [employer]    Authorization Time Period 02/01/2020 thru 04/25/2020    Authorization - Visit Number 9    Progress Note Due on Visit 10    SLP Start Time 1000    SLP Stop Time  1100    SLP Time Calculation (min) 60 min    Activity Tolerance Patient tolerated treatment well           Past Medical History:  Diagnosis Date  . Allergy   . Arthritis   . Asthma   . Cataract   . Chronic constipation   . Colon polyps   . Cough   . GERD (gastroesophageal reflux disease)   . Glaucoma   . HOH (hard of hearing)    a. Uses hearing aids  . Hyperlipidemia   . Hypertension   . Macular degeneration   . Nephrolithiasis    stent  . PAF (paroxysmal atrial fibrillation) ([place])    a. 11/2013 Dx in setting of L MCA stroke. CHA2DS2VASc = 5-->Eliquis; b. 11/2013 Echo: Nl LV fxn. Mildly dil LA.  . Stroke ([place])    a. 11/2013 L MCA stroke.  Marland Kitchen Ureteral stricture     Past Surgical History:  Procedure Laterality Date  . CATARACT EXTRACTION W/PHACO Left 12/20/2014   Procedure: CATARACT EXTRACTION PHACO AND INTRAOCULAR LENS PLACEMENT (IOC);  Surgeon: Birder Robson, MD;  Location: [facility] ORS;  Service: Ophthalmology;  Laterality: Left;   [place]     00:59.6 AP%    21.2 CDE    12.66 fluid pack lot #[id number]  . JOINT REPLACEMENT Left 2014   knee partial replacement  . kidney stent Right 1995    There were no vitals filed for this visit.   Subjective Assessment - 03/14/20 1804    Subjective pt pleasant, attempted  joking with therapist and SLP student, wife present per SLP request    Patient is accompained by: Family member    Currently in Pain? No/denies          Neuro   ST   TX   NOTE          Treatment Data and Patient's Response to Treatment   Pt was accompanied by wife per SLP request. The content of pt's responses were more inconsistent during this session. For example, he reported that hand brace was helping but then moments later, he describes the brace as ineffective but attributes improvement d/t OTC medicine he started taking.  With extensive assistance from pt's wife, clear information was obtained on all items of discussion, including acute vs chronic speech-language deficits, accurate information regarding pt has 3 children not 2, correction that pt DID receive Outpatient ST services post initial stroke (per wife it was for an extended amount of time) and his phone conversations only include his son, not his daughter Daine Floras.  Some of these inconsistencies are likely related to pt's expressive communication deficits but when corrected by his wife, pt demonstrated no recall of correct information. As a result, pt is poor  historian and is dependent on his wife for accurate information. To help pt continue conveying accurate information, his wife stated that she will come to his next ST session.   Wife reports the agraphia is baseline from previous stroke, so acutely pt states that he wants to be able to talk on the phone to his son. To facilitate this, SLP provided word finding activities targeting generation of opposites. Pt required maximal support to understand task. Pt was 74% accurate, increasing to 100% accuracy with max to moderate cues.   As indicated above, pt is not appear to express accurate information. As such, skilled ST intervention is required to increase pt's word finding ability, speech intelligibility in order to convey basic information and increase his functional independence  thereby reducing caregiver burden.        SLP Short Term Goals - 02/18/20 1254      SLP SHORT TERM GOAL #1   Title With minimal cues, pt will read list of functional phrases at slow rate to reduce dysfluencies to < 10%.    Baseline Maximal cues required    Time 10    Period --   sessions   Status New      SLP SHORT TERM GOAL #2   Title Pt will demonstrate emergent awareness of dysfluencies by self-monitoring and self-correcting dysfluencies with moderate cues.    Baseline decreased awareness    Time 10    Period --   sessions     SLP SHORT TERM GOAL #3   Title With minimal assistance, pt will generate 10 items in a basic category with 80% accuracy.    Baseline new goal    Time 10    Period --   sessions   Status New      SLP SHORT TERM GOAL #4   Title Given moderate cues, pt will complete basic functional written words by writing appropriate letter in blank with 90% accuracy.    Baseline unable    Time 10    Period --   sessions   Status New            SLP Long Term Goals - 02/18/20 1257      SLP LONG TERM GOAL #1   Title Pt will utilize fluency strategies to increase functional communication to participate in everday conversations.    Baseline able to communicate effectively 50% of time    Time 12    Period Weeks    Status New    Target Date 04/25/20      SLP LONG TERM GOAL #2   Title Pt will utilize word finding strategies to convey simple wants and needs in 50% of opportunities.    Baseline new goal    Time 12    Period Weeks    Status New    Target Date 04/25/20      SLP LONG TERM GOAL #3   Title Pt will will be able to write personal information in 50% of opportunities.    Baseline unable to correctly spell, address, zip code or his number    Time 12    Period Weeks    Status New    Target Date 04/25/20            Plan - 03/14/20 1807        Speech Therapy Frequency 2x / week    Duration 12 weeks    Treatment/Interventions Compensatory  techniques;SLP instruction and feedback;Patient/family education;Language facilitation;Functional tasks;Internal/external aids;Cueing hierarchy  Potential to Achieve Goals Fair    Potential Considerations Co-morbidities;Medical prognosis;Severity of impairments   hx of CVA   SLP Home Exercise Plan provided, see instruction section    Consulted and Agree with Plan of Care Patient;Family member/caregiver    Family Member Consulted pt's wife           Patient will benefit from skilled therapeutic intervention in order to improve the following deficits and impairments:   Aphasia  Apraxia  Cognitive communication deficit  Broca's aphasia  Diffuse brain atrophy [facility])    Problem List Patient Active Problem List   Diagnosis Date Noted  . Diffuse brain atrophy ([place]) 12/09/2019  . Balance problem 12/03/2019  . Acquired thrombophilia ([place]) 12/03/2019  . Prediabetes 05/29/2019  . Benign prostatic hyperplasia with urinary frequency 08/20/2018  . Hearing aid worn 08/16/2017  . Paronychia of finger, left 08/16/2017  . Broca's aphasia 08/16/2017  . History of cataract surgery 08/27/2016  . Age-related macular degeneration 08/27/2016  . Counseling regarding end of life decision making 08/27/2016  . Do not resuscitate status 08/27/2016  . BPH associated with nocturia 02/11/2015  . PAF (paroxysmal atrial fibrillation) ([place]) 12/14/2013  . Hyperlipidemia LDL goal <100 12/12/2013  . CVD (cerebrovascular disease) 12/10/2013  . GERD (gastroesophageal reflux disease) 07/25/2013  . Left arm numbness 07/25/2013  . Chronic constipation 01/21/2013  . Encounter for preventive health examination 01/21/2013  . Essential hypertension, benign 10/21/2012  . Glaucoma 10/21/2012  . History of colonic polyps 10/21/2012  . Ureteral stricture, right 10/21/2012   Kelissa Merlin B. Rutherford Nail M.S., CCC-SLP, [place] Pathologist Rehabilitation Services Office [phone]  Stormy Fabian 03/14/2020,  6:09 PM  [place] MAIN [facility] SERVICES [address] [place], [place], [address] Phone: [phone]   Fax:  [phone]   Name: Todd Golden MRN: [id number] Date of Birth: 1930/11/05

## 2020-03-14 NOTE — Patient Instructions (Signed)
Complete worksheet targeting opposites

## 2020-03-14 NOTE — Therapy (Signed)
[place] MAIN [facility] SERVICES [address] [place], [place], [address] Phone: [phone]   Fax:  [phone]  Physical Therapy Treatment  Patient Details  Name: Todd Golden MRN: [id number] Date of Birth: 06-29-1930 Referring Provider (PT): Dr. Deborra Medina   Encounter Date: 03/14/2020   PT End of Session - 03/14/20 1103    Visit Number 7    Number of Visits 25    Date for PT Re-Evaluation 05/11/20    Authorization Type 02/17/2020- Initial PT evaluation    PT Start Time 1102    PT Stop Time 1143    PT Time Calculation (min) 41 min    Equipment Utilized During Treatment Gait belt    Activity Tolerance Patient tolerated treatment well    Behavior During Therapy [facility] for tasks assessed/performed           Past Medical History:  Diagnosis Date  . Allergy   . Arthritis   . Asthma   . Cataract   . Chronic constipation   . Colon polyps   . Cough   . GERD (gastroesophageal reflux disease)   . Glaucoma   . HOH (hard of hearing)    a. Uses hearing aids  . Hyperlipidemia   . Hypertension   . Macular degeneration   . Nephrolithiasis    stent  . PAF (paroxysmal atrial fibrillation) ([place])    a. 11/2013 Dx in setting of L MCA stroke. CHA2DS2VASc = 5-->Eliquis; b. 11/2013 Echo: Nl LV fxn. Mildly dil LA.  . Stroke ([place])    a. 11/2013 L MCA stroke.  Marland Kitchen Ureteral stricture     Past Surgical History:  Procedure Laterality Date  . CATARACT EXTRACTION W/PHACO Left 12/20/2014   Procedure: CATARACT EXTRACTION PHACO AND INTRAOCULAR LENS PLACEMENT (IOC);  Surgeon: Birder Robson, MD;  Location: [facility] ORS;  Service: Ophthalmology;  Laterality: Left;   [place]     00:59.6 AP%    21.2 CDE    12.66 fluid pack lot #[id number]  . JOINT REPLACEMENT Left 2014   knee partial replacement  . kidney stent Right 1995    There were no vitals filed for this visit.   Subjective Assessment - 03/14/20 1102    Subjective Patient reports he has been walking  some outdoors but states "I still feel like a little old man when I walk."    Patient is accompained by: Family member    Limitations Walking    Patient Stated Goals Patient reports his goal not to fall and regain some strength in his legs.           Neuromuscular re-ed:   [place] Balance: Circular maze with BUE support focusing on ant/post/lateral weight shift x 4 trials - progressively improved with time to complete maze and advanced 2 levels.   [place] Balance: static training- maintaining center x 4 trials 1) score 009600 (distance time from center)  2) score 001240 3) score 937169 4) score 000770 5) Score [id number]  Patient performed static center training with verbal cues and feedback of video screen. He demonstrated progress with each trial with improved ability to remain close to center with No UE support  Gait trainer (biodex) at 0.89 m/s x 5 min. Time on each foot Right=51% and left = 49%     Pt educated throughout session about proper posture and technique with exercises. Improved exercise technique, movement at target joints, use of target muscles after min to mod verbal, visual, tactile cues.  PT Education - 03/15/20 1135    Education Details specific balance education with activities today.    Person(s) Educated Patient    Methods Explanation;Demonstration;Tactile cues;Verbal cues    Comprehension Verbalized understanding;Returned demonstration;Tactile cues required;Need further instruction;Verbal cues required            PT Short Term Goals - 02/17/20 1045      PT SHORT TERM GOAL #1   Title Patient will be independent in home exercise program to improve strength/mobility for better functional independence with ADLs.    Baseline 02/17/2020- Patient has no formal HEP in place.    Time 6    Period Weeks    Status New    Target Date 03/30/20             PT Long Term Goals - 02/17/20 1219      PT  LONG TERM GOAL #1   Title Patient will increase FOTO score to equal to or greater than 69 to demonstrate statistically significant improvement in mobility and quality of life.    Baseline 02/17/2020= 59    Time 12    Period Weeks    Status New    Target Date 05/11/20      PT LONG TERM GOAL #2   Title Patient (> 80 years old) will complete five times sit to stand test in < 15 seconds indicating an increased LE strength and improved balance.    Baseline 02/17/2020- 22 sec with no UE support    Time 12    Period Weeks    Status New    Target Date 05/11/20      PT LONG TERM GOAL #3   Title Pt will improve BERG from 50/56 to 53/56 or higher in order to demonstrate clinically significant improvement in balance.    Baseline 02/17/2020= 50/56    Time 12    Period Weeks    Status New    Target Date 05/11/20      PT LONG TERM GOAL #4   Title Pt will decrease TUG to below 11 seconds/decrease in order to demonstrate decreased fall risk.    Baseline 02/17/20- 13 sec without an assistive device.    Time 12    Period Weeks    Status New    Target Date 05/11/20      PT LONG TERM GOAL #5   Title Patient will demonstrate improved overall ambulation > 800 feet on all surfaces without an assistive device without loss of balance for improved community distances.    Baseline 02/17/2020-Patient reports limited ambulation in [place] community to approx 1 block    Time 12    Period Weeks    Status New    Target Date 05/11/20                 Plan - 03/14/20 1114    Clinical Impression Statement Patient performed well overall with additional balance activities and good gait sequencing with gait trainer today. He demo progress with practice on [place] balance activities. He will continue to benefit from skilled PT interventions to continue to progress his strength, balance and mobility to reduce risk of falling and restore indpendent mobility    Personal Factors and Comorbidities  Age;Comorbidity 1    Comorbidities HTN    Examination-Activity Limitations Reach Overhead;Stairs;Dressing;Carry;Locomotion Level    Examination-Participation Restrictions Yard Work    Stability/Clinical Decision Making Stable/Uncomplicated    Rehab Potential Good    PT Frequency 2x / week  PT Duration 12 weeks    PT Treatment/Interventions Cryotherapy;Moist Heat;DME Instruction;Gait training;Stair training;Functional mobility training;Therapeutic activities;Therapeutic exercise;Balance training;Neuromuscular re-education;Patient/family education;Manual techniques;Passive range of motion    PT Next Visit Plan Continue with progressive LE strengthening, balance, gait training.    Consulted and Agree with Plan of Care Patient           Patient will benefit from skilled therapeutic intervention in order to improve the following deficits and impairments:  Abnormal gait,Decreased activity tolerance,Cardiopulmonary status limiting activity,Decreased balance,Decreased endurance,Decreased mobility,Decreased strength,Difficulty walking,Impaired UE functional use  Visit Diagnosis: Muscle weakness (generalized)  Difficulty in walking, not elsewhere classified     Problem List Patient Active Problem List   Diagnosis Date Noted  . Diffuse brain atrophy ([place]) 12/09/2019  . Balance problem 12/03/2019  . Acquired thrombophilia ([place]) 12/03/2019  . Prediabetes 05/29/2019  . Benign prostatic hyperplasia with urinary frequency 08/20/2018  . Hearing aid worn 08/16/2017  . Paronychia of finger, left 08/16/2017  . Broca's aphasia 08/16/2017  . History of cataract surgery 08/27/2016  . Age-related macular degeneration 08/27/2016  . Counseling regarding end of life decision making 08/27/2016  . Do not resuscitate status 08/27/2016  . BPH associated with nocturia 02/11/2015  . PAF (paroxysmal atrial fibrillation) ([place]) 12/14/2013  . Hyperlipidemia LDL goal <100 12/12/2013  . CVD (cerebrovascular  disease) 12/10/2013  . GERD (gastroesophageal reflux disease) 07/25/2013  . Left arm numbness 07/25/2013  . Chronic constipation 01/21/2013  . Encounter for preventive health examination 01/21/2013  . Essential hypertension, benign 10/21/2012  . Glaucoma 10/21/2012  . History of colonic polyps 10/21/2012  . Ureteral stricture, right 10/21/2012    Lewis Moccasin, PT 03/15/2020, 11:39 AM  [place] MAIN [facility] SERVICES [address] [place], [place], [address] Phone: [phone]   Fax:  [phone]  Name: Todd Golden MRN: [id number] Date of Birth: [birth year]

## 2020-03-16 ENCOUNTER — Ambulatory Visit: Payer: PPO | Admitting: Speech Pathology

## 2020-03-16 ENCOUNTER — Ambulatory Visit: Payer: PPO

## 2020-03-16 ENCOUNTER — Other Ambulatory Visit: Payer: Self-pay

## 2020-03-16 DIAGNOSIS — R4701 Aphasia: Secondary | ICD-10-CM

## 2020-03-16 DIAGNOSIS — R262 Difficulty in walking, not elsewhere classified: Secondary | ICD-10-CM

## 2020-03-16 DIAGNOSIS — R482 Apraxia: Secondary | ICD-10-CM

## 2020-03-16 DIAGNOSIS — M6281 Muscle weakness (generalized): Secondary | ICD-10-CM

## 2020-03-16 NOTE — Therapy (Signed)
[place] MAIN [facility] SERVICES [address] [place], [place], [address] Phone: [phone]   Fax:  [phone]  Physical Therapy Treatment  Patient Details  Name: Todd Golden MRN: [id number] Date of Birth: September 09, 1930 Referring Provider (PT): Dr. Deborra Medina   Encounter Date: 03/16/2020   PT End of Session - 03/16/20 1139    Visit Number 8    Number of Visits 25    Date for PT Re-Evaluation 05/11/20    Authorization Type 02/17/2020- Initial PT evaluation    PT Start Time 1054    PT Stop Time 1140    PT Time Calculation (min) 46 min    Equipment Utilized During Treatment Gait belt    Activity Tolerance Patient tolerated treatment well    Behavior During Therapy [facility] for tasks assessed/performed           Past Medical History:  Diagnosis Date  . Allergy   . Arthritis   . Asthma   . Cataract   . Chronic constipation   . Colon polyps   . Cough   . GERD (gastroesophageal reflux disease)   . Glaucoma   . HOH (hard of hearing)    a. Uses hearing aids  . Hyperlipidemia   . Hypertension   . Macular degeneration   . Nephrolithiasis    stent  . PAF (paroxysmal atrial fibrillation) ([place])    a. 11/2013 Dx in setting of L MCA stroke. CHA2DS2VASc = 5-->Eliquis; b. 11/2013 Echo: Nl LV fxn. Mildly dil LA.  . Stroke ([place])    a. 11/2013 L MCA stroke.  Marland Kitchen Ureteral stricture     Past Surgical History:  Procedure Laterality Date  . CATARACT EXTRACTION W/PHACO Left 12/20/2014   Procedure: CATARACT EXTRACTION PHACO AND INTRAOCULAR LENS PLACEMENT (IOC);  Surgeon: Birder Robson, MD;  Location: [facility] ORS;  Service: Ophthalmology;  Laterality: Left;   [place]     00:59.6 AP%    21.2 CDE    12.66 fluid pack lot #[id number]  . JOINT REPLACEMENT Left 2014   knee partial replacement  . kidney stent Right 1995    There were no vitals filed for this visit.   Subjective Assessment - 03/16/20 1137    Subjective Patient reports no new issues and  denies any pain.    Limitations Walking    Patient Stated Goals Patient reports his goal not to fall and regain some strength in his legs.    Currently in Pain? No/denies             Neuromuscular re-education:  In //bars:- side stepping across 1/2 foam roll (curve side up) x 8 feet to left then right x 6 trials - initially with BUE support then improved to no UE support - with CGA - Patient improved with steadiness with increased trials.   Forward/backward gait in //Bars- stepping onto strategically placed fabric on floor for feedback. Patient demo difficulty with backward gait and unable to extend hip for backward step well despite VC and feedback of fabric for placement- He exhibited no difficulty with forward gait however- stepping smoothly onto fabric squares.   Cone activities- Figure 8 walking around 4 cones placed approx 2 feet apart x 5 trials. Patient exhibited mild difficulty with coordinating movements. Patient then performed backward- side- forward- side step (around 4 cones)- focusing on sequencing and patient improved with each trial.   Scavenger hunt- Placed cones in gym (varying heights and location) and patient then ambulated around the gym collecting  cones (some from low surfaces - floor to overhead). Patient able to scan room and locate/pick up cones without difficulty.   Kore balance training:  [place] Balance: static training- maintaining center x 4 trials without UE support 1) score [id number] (distance time from center)  2) score [id number] 3) score 119417 4) score 408144   Patient performed static center training with verbal cues and feedback of video screen. He demonstrated increased fatigue with session and experienced increased difficulty maintaining center at end of session.                        PT Education - 03/16/20 1138    Education Details specific balance education techniques.    Person(s) Educated Patient    Methods  Explanation;Demonstration;Tactile cues;Verbal cues    Comprehension Verbalized understanding;Returned demonstration;Verbal cues required;Tactile cues required;Need further instruction            PT Short Term Goals - 02/17/20 1045      PT SHORT TERM GOAL #1   Title Patient will be independent in home exercise program to improve strength/mobility for better functional independence with ADLs.    Baseline 02/17/2020- Patient has no formal HEP in place.    Time 6    Period Weeks    Status New    Target Date 03/30/20             PT Long Term Goals - 02/17/20 1219      PT LONG TERM GOAL #1   Title Patient will increase FOTO score to equal to or greater than 69 to demonstrate statistically significant improvement in mobility and quality of life.    Baseline 02/17/2020= 59    Time 12    Period Weeks    Status New    Target Date 05/11/20      PT LONG TERM GOAL #2   Title Patient (> 51 years old) will complete five times sit to stand test in < 15 seconds indicating an increased LE strength and improved balance.    Baseline 02/17/2020- 22 sec with no UE support    Time 12    Period Weeks    Status New    Target Date 05/11/20      PT LONG TERM GOAL #3   Title Pt will improve BERG from 50/56 to 53/56 or higher in order to demonstrate clinically significant improvement in balance.    Baseline 02/17/2020= 50/56    Time 12    Period Weeks    Status New    Target Date 05/11/20      PT LONG TERM GOAL #4   Title Pt will decrease TUG to below 11 seconds/decrease in order to demonstrate decreased fall risk.    Baseline 02/17/20- 13 sec without an assistive device.    Time 12    Period Weeks    Status New    Target Date 05/11/20      PT LONG TERM GOAL #5   Title Patient will demonstrate improved overall ambulation > 800 feet on all surfaces without an assistive device without loss of balance for improved community distances.    Baseline 02/17/2020-Patient reports limited  ambulation in [place] to approx 1 block    Time 12    Period Weeks    Status New    Target Date 05/11/20                 Plan - 03/16/20 1439  Clinical Impression Statement Patient performed well with all cone activities exhibiting no loss of balance or significant difficulty other than stepping/walking backward. Patietn exhibited increased fatigue at end of session and presented with decreased score on static balance training today versus last visit. He will continue to benefit from skilled PT interventions to continue to progress his strength, balance and mobility to reduce risk of falling and restore indpendent mobility    Personal Factors and Comorbidities Age;Comorbidity 1    Comorbidities HTN    Examination-Activity Limitations Reach Overhead;Stairs;Dressing;Carry;Locomotion Level    Examination-Participation Restrictions Yard Work    Stability/Clinical Decision Making Stable/Uncomplicated    Rehab Potential Good    PT Frequency 2x / week    PT Duration 12 weeks    PT Treatment/Interventions Cryotherapy;Moist Heat;DME Instruction;Gait training;Stair training;Functional mobility training;Therapeutic activities;Therapeutic exercise;Balance training;Neuromuscular re-education;Patient/family education;Manual techniques;Passive range of motion    PT Next Visit Plan Continue with progressive LE strengthening, balance, gait training (outdoors next week- weather permitting)    Consulted and Agree with Plan of Care Patient           Patient will benefit from skilled therapeutic intervention in order to improve the following deficits and impairments:  Abnormal gait,Decreased activity tolerance,Cardiopulmonary status limiting activity,Decreased balance,Decreased endurance,Decreased mobility,Decreased strength,Difficulty walking,Impaired UE functional use  Visit Diagnosis: Muscle weakness (generalized)  Difficulty in walking, not elsewhere classified     Problem  List Patient Active Problem List   Diagnosis Date Noted  . Diffuse brain atrophy ([place]) 12/09/2019  . Balance problem 12/03/2019  . Acquired thrombophilia ([place]) 12/03/2019  . Prediabetes 05/29/2019  . Benign prostatic hyperplasia with urinary frequency 08/20/2018  . Hearing aid worn 08/16/2017  . Paronychia of finger, left 08/16/2017  . Broca's aphasia 08/16/2017  . History of cataract surgery 08/27/2016  . Age-related macular degeneration 08/27/2016  . Counseling regarding end of life decision making 08/27/2016  . Do not resuscitate status 08/27/2016  . BPH associated with nocturia 02/11/2015  . PAF (paroxysmal atrial fibrillation) ([place]) 12/14/2013  . Hyperlipidemia LDL goal <100 12/12/2013  . CVD (cerebrovascular disease) 12/10/2013  . GERD (gastroesophageal reflux disease) 07/25/2013  . Left arm numbness 07/25/2013  . Chronic constipation 01/21/2013  . Encounter for preventive health examination 01/21/2013  . Essential hypertension, benign 10/21/2012  . Glaucoma 10/21/2012  . History of colonic polyps 10/21/2012  . Ureteral stricture, right 10/21/2012    Lewis Moccasin, PT 03/16/2020, 3:02 PM  [place] MAIN [facility] SERVICES [address] [place], [place], [address] Phone: [phone]   Fax:  [phone]  Name: XAVIEN DAUPHINAIS MRN: [id number] Date of Birth: 1930-12-18

## 2020-03-17 NOTE — Therapy (Signed)
[place] MAIN [facility] SERVICES [address] [place], [place], [address] Phone: [phone]   Fax:  [phone]  Speech Language Pathology Treatment Speech Therapy Progress Note   Dates of reporting period  02/17/2020   to   03/16/2020   Patient Details  Name: Todd Golden MRN: [id number] Date of Birth: [birth year] Referring Provider (SLP): Fara Olden   Encounter Date: 03/16/2020   End of Session - 03/17/20 1324    Visit Number 10    Number of Visits 25    Date for SLP Re-Evaluation 04/25/20    Authorization Type [employer]    Authorization Time Period 02/01/2020 thru 04/25/2020    Authorization - Visit Number 10    Progress Note Due on Visit 10    SLP Start Time 1000    SLP Stop Time  1100    SLP Time Calculation (min) 60 min    Activity Tolerance Patient tolerated treatment well           Past Medical History:  Diagnosis Date  . Allergy   . Arthritis   . Asthma   . Cataract   . Chronic constipation   . Colon polyps   . Cough   . GERD (gastroesophageal reflux disease)   . Glaucoma   . HOH (hard of hearing)    a. Uses hearing aids  . Hyperlipidemia   . Hypertension   . Macular degeneration   . Nephrolithiasis    stent  . PAF (paroxysmal atrial fibrillation) ([place])    a. 11/2013 Dx in setting of L MCA stroke. CHA2DS2VASc = 5-->Eliquis; b. 11/2013 Echo: Nl LV fxn. Mildly dil LA.  . Stroke ([place])    a. 11/2013 L MCA stroke.  Marland Kitchen Ureteral stricture     Past Surgical History:  Procedure Laterality Date  . CATARACT EXTRACTION W/PHACO Left 12/20/2014   Procedure: CATARACT EXTRACTION PHACO AND INTRAOCULAR LENS PLACEMENT (IOC);  Surgeon: Birder Robson, MD;  Location: [facility] ORS;  Service: Ophthalmology;  Laterality: Left;   [place]     00:59.6 AP%    21.2 CDE    12.66 fluid pack lot #[id number]  . JOINT REPLACEMENT Left 2014   knee partial replacement  . kidney stent Right 1995    There were no vitals filed for  this visit.   Subjective Assessment - 03/17/20 1323    Subjective pt pleasant, attempted joking with therapist and SLP student, wife present per SLP request    Patient is accompained by: Family member    Currently in Pain? No/denies            Neuro   ST   TX   NOTE          Treatment Data and Patient's Response to Treatment   Skilled treatment session focused on expressive communication. Pt was accompanied by his wife at [facility] request. Pt was social and attempted to communicate that he got cold last night because the blankets were not on him. Pt's communication was < 25% and wife stated, "He is trying to say that he got cold last night." Pt and his wife also state that they have received date for MRI. Pt followed up with comment "there has got to be something wrong up there (pointing to his head) because a lot is wrong now." Pt's wife continues to endorse acute changes as result of fall.   SLP facilitated session by providing word finding task of opposites. Pt had completed previous homework targeting basic  opposites, therefore during this session, more abstract words were utilized such as "modern, distinctive, deliberate." Pt required multiple layers of maximal assistance finally resulting in matching word to his provided opposite in sets of 10.    10TH VISIT PROGRESS NOTE Given severity of pt's deficits, he has made minimal progress towards his STG goals. As such, 3 goals were downgraded and pt's writing goal was deferred as pt's wife states his inability to write is baseline from previous stroke. Given his the severity of his deficits, pt is completely dependent on his wife for conveying accurate information. This is further complicated by deficits in memory as she occasionally has to correct pt recall. As such, skilled ST services continue to be indicated to increase pt's abilities to communication basic wants and needs thereby decreasing caregiver burden.      SLP Education - 03/17/20  1324    Education Details rationale of targeting opposite to increase word finding abilities    Person(s) Educated Patient;Spouse    Methods Explanation;Demonstration;Verbal cues;Handout    Comprehension Verbalized understanding;Verbal cues required;Need further instruction            SLP Short Term Goals - 03/17/20 1325      SLP SHORT TERM GOAL #1   Title With minimal cues, pt will read list of 10 functional phrases at slow rate to reduce dysfluencies to < 25%.    Baseline Max to moderate assistance    Time 10    Period --   sessions   Status Not Met      SLP SHORT TERM GOAL #2   Title Given maximal support, pt will demonstrate emergent awareness of dysfluencies by self-monitoring and self-correcting dysfluencies in 5 out of 10 opportunities.    Baseline total assistance required    Time 10    Period --   sessions   Status Not Met      SLP SHORT TERM GOAL #3   Title With minimal to moderate assistance, pt will generate 6 items in a basic category in 7 out of 10 opportunities.    Baseline currently able to generate 4 items with max to moderate assistance    Time 10    Period --   sessions   Status Not Met      SLP SHORT TERM GOAL #4   Title Given moderate cues, pt will complete basic functional written words by writing appropriate letter in blank with 90% accuracy.    Baseline considered to be a baseline deficit    Status Deferred   previous baseline deficit           SLP Long Term Goals - 03/17/20 1333      SLP LONG TERM GOAL #1   Title Pt will utilize fluency strategies to increase functional communication to participate in everday conversations.    Status On-going      SLP LONG TERM GOAL #2   Title Pt will utilize word finding strategies to convey simple wants and needs in 50% of opportunities.    Status On-going      SLP LONG TERM GOAL #3   Title Pt will will be able to write personal information in 50% of opportunities.    Status Deferred   pt with baseline  deficits in writing           Plan - 03/17/20 1325    Speech Therapy Frequency 2x / week    Duration 12 weeks    Treatment/Interventions Compensatory techniques;SLP instruction and feedback;Patient/family  education;Language facilitation;Functional tasks;Internal/external aids;Cueing hierarchy    Potential to Achieve Goals Fair    Potential Considerations Co-morbidities;Medical prognosis;Severity of impairments   hx of CVA   SLP Home Exercise Plan provided, see instruction section    Consulted and Agree with Plan of Care Patient;Family member/caregiver    Family Member Consulted pt's wife           Patient will benefit from skilled therapeutic intervention in order to improve the following deficits and impairments:   Aphasia  Apraxia  Broca's aphasia    Problem List Patient Active Problem List   Diagnosis Date Noted  . Diffuse brain atrophy ([place]) 12/09/2019  . Balance problem 12/03/2019  . Acquired thrombophilia ([place]) 12/03/2019  . Prediabetes 05/29/2019  . Benign prostatic hyperplasia with urinary frequency 08/20/2018  . Hearing aid worn 08/16/2017  . Paronychia of finger, left 08/16/2017  . Broca's aphasia 08/16/2017  . History of cataract surgery 08/27/2016  . Age-related macular degeneration 08/27/2016  . Counseling regarding end of life decision making 08/27/2016  . Do not resuscitate status 08/27/2016  . BPH associated with nocturia 02/11/2015  . PAF (paroxysmal atrial fibrillation) ([place]) 12/14/2013  . Hyperlipidemia LDL goal <100 12/12/2013  . CVD (cerebrovascular disease) 12/10/2013  . GERD (gastroesophageal reflux disease) 07/25/2013  . Left arm numbness 07/25/2013  . Chronic constipation 01/21/2013  . Encounter for preventive health examination 01/21/2013  . Essential hypertension, benign 10/21/2012  . Glaucoma 10/21/2012  . History of colonic polyps 10/21/2012  . Ureteral stricture, right 10/21/2012   Leighton Luster B. Rutherford Nail M.S., CCC-SLP,  [place] [facility]ologist Rehabilitation Services Office [phone]  Stormy Fabian 03/17/2020, 1:35 PM  [place] MAIN [facility] SERVICES [address] [place], [place], [address] Phone: [phone]   Fax:  [phone]   Name: Todd Golden MRN: [id number] Date of Birth: 06-08-1930

## 2020-03-17 NOTE — Patient Instructions (Signed)
Complete matching exercise targeting opposites

## 2020-03-18 ENCOUNTER — Other Ambulatory Visit: Payer: Self-pay

## 2020-03-18 ENCOUNTER — Ambulatory Visit
Admission: RE | Admit: 2020-03-18 | Discharge: 2020-03-18 | Disposition: A | Discharge status: Home / Self Care | Payer: PPO | Source: Ambulatory Visit | Attending: Neurology | Admitting: Neurology

## 2020-03-18 DIAGNOSIS — R413 Other amnesia: Secondary | ICD-10-CM | POA: Diagnosis not present

## 2020-03-18 DIAGNOSIS — R2689 Other abnormalities of gait and mobility: Secondary | ICD-10-CM | POA: Diagnosis not present

## 2020-03-18 DIAGNOSIS — R4182 Altered mental status, unspecified: Secondary | ICD-10-CM | POA: Diagnosis not present

## 2020-03-18 DIAGNOSIS — R4701 Aphasia: Secondary | ICD-10-CM

## 2020-03-18 DIAGNOSIS — R479 Unspecified speech disturbances: Secondary | ICD-10-CM | POA: Diagnosis not present

## 2020-03-21 ENCOUNTER — Ambulatory Visit: Payer: PPO | Admitting: Speech Pathology

## 2020-03-21 ENCOUNTER — Other Ambulatory Visit: Payer: Self-pay

## 2020-03-21 ENCOUNTER — Ambulatory Visit: Payer: PPO | Attending: Internal Medicine

## 2020-03-21 DIAGNOSIS — R262 Difficulty in walking, not elsewhere classified: Secondary | ICD-10-CM | POA: Diagnosis not present

## 2020-03-21 DIAGNOSIS — R4701 Aphasia: Secondary | ICD-10-CM

## 2020-03-21 DIAGNOSIS — R482 Apraxia: Secondary | ICD-10-CM | POA: Diagnosis not present

## 2020-03-21 DIAGNOSIS — M6281 Muscle weakness (generalized): Secondary | ICD-10-CM | POA: Diagnosis not present

## 2020-03-21 DIAGNOSIS — R41841 Cognitive communication deficit: Secondary | ICD-10-CM | POA: Insufficient documentation

## 2020-03-21 DIAGNOSIS — G319 Degenerative disease of nervous system, unspecified: Secondary | ICD-10-CM | POA: Diagnosis not present

## 2020-03-21 NOTE — Therapy (Signed)
[place] MAIN [facility] SERVICES [address] [place], [place], [address] Phone: [phone]   Fax:  [phone]  Physical Therapy Treatment  Patient Details  Name: Todd Golden MRN: [id number] Date of Birth: 1930/12/09 Referring Provider (PT): Dr. Deborra Medina   Encounter Date: 03/21/2020   PT End of Session - 03/21/20 1135    Visit Number 9    Number of Visits 25    Date for PT Re-Evaluation 05/11/20    Authorization Type 02/17/2020- Initial PT evaluation    PT Start Time 1100    PT Stop Time 1135    PT Time Calculation (min) 35 min    Equipment Utilized During Treatment Gait belt    Activity Tolerance Patient tolerated treatment well    Behavior During Therapy [facility] for tasks assessed/performed           Past Medical History:  Diagnosis Date  . Allergy   . Arthritis   . Asthma   . Cataract   . Chronic constipation   . Colon polyps   . Cough   . GERD (gastroesophageal reflux disease)   . Glaucoma   . HOH (hard of hearing)    a. Uses hearing aids  . Hyperlipidemia   . Hypertension   . Macular degeneration   . Nephrolithiasis    stent  . PAF (paroxysmal atrial fibrillation) ([place])    a. 11/2013 Dx in setting of L MCA stroke. CHA2DS2VASc = 5-->Eliquis; b. 11/2013 Echo: Nl LV fxn. Mildly dil LA.  . Stroke ([place])    a. 11/2013 L MCA stroke.  Marland Kitchen Ureteral stricture     Past Surgical History:  Procedure Laterality Date  . CATARACT EXTRACTION W/PHACO Left 12/20/2014   Procedure: CATARACT EXTRACTION PHACO AND INTRAOCULAR LENS PLACEMENT (IOC);  Surgeon: Birder Robson, MD;  Location: [facility] ORS;  Service: Ophthalmology;  Laterality: Left;   [place]     00:59.6 AP%    21.2 CDE    12.66 fluid pack lot #[id number] H  . JOINT REPLACEMENT Left 2014   knee partial replacement  . kidney stent Right 1995    There were no vitals filed for this visit.   Subjective Assessment - 03/21/20 1110    Subjective Patient reports no new issues and  denies any pain.    Limitations Walking    Patient Stated Goals Patient reports his goal not to fall and regain some strength in his legs.    Currently in Pain? No/denies             Patient performed the following in treatment today:  Seated Hip abd with GTB 2 sets 15 reps Seated Hip flex with GTB 2 sets of 15 reps VC and visual demo for correct technique and to perform slowly through as full of range of motion for max benefit.   Gait indoor and outdoors- Patient ambulated approximately 25 min on level and unlevel surfaces including concrete, carpet, Asphalt, inclines/declines, [employer], up/down curbs- Patient ambulated without an AD, supervision to [place]. Patient did exhibit good ability on all terrain yet did exhibit a few episodes of unsteadiness-- requiring CGA and towards the end of ambulation- some decreased left toe clearance- cues to heel strike and patient improved with no further issues. Patient was visibly fatigued yet denied any pain only tired afterward.                         PT Education - 03/21/20 1134  Education Details Cues for safety with gait; Specific exercise technique    Person(s) Educated Patient    Methods Explanation;Demonstration;Tactile cues;Verbal cues;Handout    Comprehension Verbalized understanding;Tactile cues required;Returned demonstration;Verbal cues required;Need further instruction            PT Short Term Goals - 02/17/20 1045      PT SHORT TERM GOAL #1   Title Patient will be independent in home exercise program to improve strength/mobility for better functional independence with ADLs.    Baseline 02/17/2020- Patient has no formal HEP in place.    Time 6    Period Weeks    Status New    Target Date 03/30/20             PT Long Term Goals - 02/17/20 1219      PT LONG TERM GOAL #1   Title Patient will increase FOTO score to equal to or greater than 69 to demonstrate statistically significant  improvement in mobility and quality of life.    Baseline 02/17/2020= 59    Time 12    Period Weeks    Status New    Target Date 05/11/20      PT LONG TERM GOAL #2   Title Patient (> 85 years old) will complete five times sit to stand test in < 15 seconds indicating an increased LE strength and improved balance.    Baseline 02/17/2020- 22 sec with no UE support    Time 12    Period Weeks    Status New    Target Date 05/11/20      PT LONG TERM GOAL #3   Title Pt will improve BERG from 50/56 to 53/56 or higher in order to demonstrate clinically significant improvement in balance.    Baseline 02/17/2020= 50/56    Time 12    Period Weeks    Status New    Target Date 05/11/20      PT LONG TERM GOAL #4   Title Pt will decrease TUG to below 11 seconds/decrease in order to demonstrate decreased fall risk.    Baseline 02/17/20- 13 sec without an assistive device.    Time 12    Period Weeks    Status New    Target Date 05/11/20      PT LONG TERM GOAL #5   Title Patient will demonstrate improved overall ambulation > 800 feet on all surfaces without an assistive device without loss of balance for improved community distances.    Baseline 02/17/2020-Patient reports limited ambulation in [place] community to approx 1 block    Time 12    Period Weeks    Status New    Target Date 05/11/20                 Plan - 03/21/20 1240    Clinical Impression Statement Patient performed well with outdoor mobility with only a few instances of unsteadiness yet able to self correct. He did experience some fatigue with increased distances with decreased left foot clearance coming back indoor on level surfacs. He will continue to benefit from skilled PT interventions to continue to progress his strength, balance and mobility to reduce risk of falling and restore indpendent mobility    Personal Factors and Comorbidities Age;Comorbidity 1    Comorbidities HTN    Examination-Activity Limitations  Reach Overhead;Stairs;Dressing;Carry;Locomotion Level    Examination-Participation Restrictions Yard Work    Stability/Clinical Decision Making Stable/Uncomplicated    Rehab Potential Good    PT  Frequency 2x / week    PT Duration 12 weeks    PT Treatment/Interventions Cryotherapy;Moist Heat;DME Instruction;Gait training;Stair training;Functional mobility training;Therapeutic activities;Therapeutic exercise;Balance training;Neuromuscular re-education;Patient/family education;Manual techniques;Passive range of motion    PT Next Visit Plan Continue with progressive LE strengthening, balance, gait training (outdoors next week- weather permitting)    PT Home Exercise Plan no changes.    Consulted and Agree with Plan of Care Patient           Patient will benefit from skilled therapeutic intervention in order to improve the following deficits and impairments:  Abnormal gait,Decreased activity tolerance,Cardiopulmonary status limiting activity,Decreased balance,Decreased endurance,Decreased mobility,Decreased strength,Difficulty walking,Impaired UE functional use  Visit Diagnosis: Muscle weakness (generalized)  Difficulty in walking, not elsewhere classified     Problem List Patient Active Problem List   Diagnosis Date Noted  . Diffuse brain atrophy ([place]) 12/09/2019  . Balance problem 12/03/2019  . Acquired thrombophilia ([place]) 12/03/2019  . Prediabetes 05/29/2019  . Benign prostatic hyperplasia with urinary frequency 08/20/2018  . Hearing aid worn 08/16/2017  . Paronychia of finger, left 08/16/2017  . Broca's aphasia 08/16/2017  . History of cataract surgery 08/27/2016  . Age-related macular degeneration 08/27/2016  . Counseling regarding end of life decision making 08/27/2016  . Do not resuscitate status 08/27/2016  . BPH associated with nocturia 02/11/2015  . PAF (paroxysmal atrial fibrillation) ([place]) 12/14/2013  . Hyperlipidemia LDL goal <100 12/12/2013  . CVD (cerebrovascular  disease) 12/10/2013  . GERD (gastroesophageal reflux disease) 07/25/2013  . Left arm numbness 07/25/2013  . Chronic constipation 01/21/2013  . Encounter for preventive health examination 01/21/2013  . Essential hypertension, benign 10/21/2012  . Glaucoma 10/21/2012  . History of colonic polyps 10/21/2012  . Ureteral stricture, right 10/21/2012    Lewis Moccasin, PT 03/21/2020, 12:54 PM  [place] MAIN [facility] SERVICES [address] [place], [place], [address] Phone: [phone]   Fax:  [phone]  Name: KAHLEEL FADELEY MRN: [id number] Date of Birth: 03/01/1930

## 2020-03-22 DIAGNOSIS — H353221 Exudative age-related macular degeneration, left eye, with active choroidal neovascularization: Secondary | ICD-10-CM | POA: Diagnosis not present

## 2020-03-22 NOTE — Therapy (Signed)
[place] MAIN [facility] SERVICES [address] [place], [place], [address] Phone: [phone]   Fax:  [phone]  Speech Language Pathology Treatment  Patient Details  Name: Todd Golden MRN: [id number] Date of Birth: 26-Dec-1930 Referring Provider (SLP): Fara Olden   Encounter Date: 03/21/2020   End of Session - 03/22/20 1134    Visit Number 11    Number of Visits 25    Date for SLP Re-Evaluation 04/25/20    Authorization Type [employer]    Authorization Time Period 02/01/2020 thru 04/25/2020    Authorization - Visit Number 1    Progress Note Due on Visit 10    SLP Start Time 1000    SLP Stop Time  1100    SLP Time Calculation (min) 60 min    Activity Tolerance Patient tolerated treatment well           Past Medical History:  Diagnosis Date  . Allergy   . Arthritis   . Asthma   . Cataract   . Chronic constipation   . Colon polyps   . Cough   . GERD (gastroesophageal reflux disease)   . Glaucoma   . HOH (hard of hearing)    a. Uses hearing aids  . Hyperlipidemia   . Hypertension   . Macular degeneration   . Nephrolithiasis    stent  . PAF (paroxysmal atrial fibrillation) ([place])    a. 11/2013 Dx in setting of L MCA stroke. CHA2DS2VASc = 5-->Eliquis; b. 11/2013 Echo: Nl LV fxn. Mildly dil LA.  . Stroke ([place])    a. 11/2013 L MCA stroke.  Marland Kitchen Ureteral stricture     Past Surgical History:  Procedure Laterality Date  . CATARACT EXTRACTION W/PHACO Left 12/20/2014   Procedure: CATARACT EXTRACTION PHACO AND INTRAOCULAR LENS PLACEMENT (IOC);  Surgeon: Birder Robson, MD;  Location: [facility] ORS;  Service: Ophthalmology;  Laterality: Left;   [place]     00:59.6 AP%    21.2 CDE    12.66 fluid pack lot #[id number] H  . JOINT REPLACEMENT Left 2014   knee partial replacement  . kidney stent Right 1995    There were no vitals filed for this visit.   Subjective Assessment - 03/22/20 1129    Subjective pt pleasant, "I am slow  this morning"    Patient is accompained by: Family member    Currently in Pain? No/denies            Neuro   ST   TX   NOTE          Treatment Data and Patient's Response to Treatment   Skilled treatment session focused on pt's expressive language goals; specifically increasing word finding ability thru increased vocabulary. SLP facilitated session by targeting generation of synonyms. Pt generated the correct synonym with 82% accuracy when completing pages 67-70 in [facility] book 8. SLP further facilitated session by presenting word associations with pt requiring maximal assistance that was faded to minimal assistance to achieve 90%. Providing word associations appeared easier for pt, his responses were much quicker.   Overall pt appeared to struggle more during this session perhaps d/t his feeling of being "slow." Pt's expressive deficits continue to prevent him from expressing his basic wants and needs, therefore skilled ST intervention is required to equip pt with this basic necessity.         SLP Education - 03/22/20 1133    Education Details word finding strategies    Person(s) Educated Patient;Spouse  Methods Explanation;Demonstration;Verbal cues;Handout    Comprehension Verbalized understanding;Returned demonstration;Verbal cues required;Need further instruction            SLP Short Term Goals - 03/17/20 1325      SLP SHORT TERM GOAL #1   Title With minimal cues, pt will read list of 10 functional phrases at slow rate to reduce dysfluencies to < 25%.    Baseline Max to moderate assistance    Time 10    Period --   sessions   Status Not Met      SLP SHORT TERM GOAL #2   Title Given maximal support, pt will demonstrate emergent awareness of dysfluencies by self-monitoring and self-correcting dysfluencies in 5 out of 10 opportunities.    Baseline total assistance required    Time 10    Period --   sessions   Status Not Met      SLP SHORT TERM GOAL #3   Title With  minimal to moderate assistance, pt will generate 6 items in a basic category in 7 out of 10 opportunities.    Baseline currently able to generate 4 items with max to moderate assistance    Time 10    Period --   sessions   Status Not Met      SLP SHORT TERM GOAL #4   Title Given moderate cues, pt will complete basic functional written words by writing appropriate letter in blank with 90% accuracy.    Baseline considered to be a baseline deficit    Status Deferred   previous baseline deficit           SLP Long Term Goals - 03/17/20 1333      SLP LONG TERM GOAL #1   Title Pt will utilize fluency strategies to increase functional communication to participate in everday conversations.    Status On-going      SLP LONG TERM GOAL #2   Title Pt will utilize word finding strategies to convey simple wants and needs in 50% of opportunities.    Status On-going      SLP LONG TERM GOAL #3   Title Pt will will be able to write personal information in 50% of opportunities.    Status Deferred   pt with baseline deficits in writing           Plan - 03/22/20 1135    Speech Therapy Frequency 2x / week    Duration 12 weeks    Treatment/Interventions Compensatory techniques;SLP instruction and feedback;Patient/family education;Language facilitation;Functional tasks;Internal/external aids;Cueing hierarchy    Potential to Achieve Goals Fair    Potential Considerations Co-morbidities;Medical prognosis;Severity of impairments   hx of CVa, recent MRI revealed Advanced Atrophy   SLP Home Exercise Plan provided, see instruction section    Consulted and Agree with Plan of Care Patient;Family member/caregiver    Family Member Consulted pt's wife           Patient will benefit from skilled therapeutic intervention in order to improve the following deficits and impairments:   Aphasia  Apraxia  Broca's aphasia  Diffuse brain atrophy [facility])    Problem List Patient Active Problem List    Diagnosis Date Noted  . Diffuse brain atrophy ([place]) 12/09/2019  . Balance problem 12/03/2019  . Acquired thrombophilia ([place]) 12/03/2019  . Prediabetes 05/29/2019  . Benign prostatic hyperplasia with urinary frequency 08/20/2018  . Hearing aid worn 08/16/2017  . Paronychia of finger, left 08/16/2017  . Broca's aphasia 08/16/2017  . History of cataract surgery  08/27/2016  . Age-related macular degeneration 08/27/2016  . Counseling regarding end of life decision making 08/27/2016  . Do not resuscitate status 08/27/2016  . BPH associated with nocturia 02/11/2015  . PAF (paroxysmal atrial fibrillation) ([place]) 12/14/2013  . Hyperlipidemia LDL goal <100 12/12/2013  . CVD (cerebrovascular disease) 12/10/2013  . GERD (gastroesophageal reflux disease) 07/25/2013  . Left arm numbness 07/25/2013  . Chronic constipation 01/21/2013  . Encounter for preventive health examination 01/21/2013  . Essential hypertension, benign 10/21/2012  . Glaucoma 10/21/2012  . History of colonic polyps 10/21/2012  . Ureteral stricture, right 10/21/2012   Delta Deshmukh B. Rutherford Nail M.S., CCC-SLP, [place] Pathologist Rehabilitation Services Office [phone]  Stormy Fabian 03/22/2020, 11:37 AM  [place] MAIN [facility] SERVICES [address] [place], [place], [address] Phone: [phone]   Fax:  [phone]   Name: Todd Golden MRN: [id number] Date of Birth: 12/06/1930

## 2020-03-22 NOTE — Patient Instructions (Signed)
Complete association and generative naming worksheets

## 2020-03-23 ENCOUNTER — Other Ambulatory Visit: Payer: Self-pay

## 2020-03-23 ENCOUNTER — Ambulatory Visit: Payer: PPO

## 2020-03-23 ENCOUNTER — Ambulatory Visit: Payer: PPO | Admitting: Speech Pathology

## 2020-03-23 DIAGNOSIS — J301 Allergic rhinitis due to pollen: Secondary | ICD-10-CM | POA: Diagnosis not present

## 2020-03-23 DIAGNOSIS — R4701 Aphasia: Secondary | ICD-10-CM

## 2020-03-23 DIAGNOSIS — M6281 Muscle weakness (generalized): Secondary | ICD-10-CM | POA: Diagnosis not present

## 2020-03-23 DIAGNOSIS — R482 Apraxia: Secondary | ICD-10-CM

## 2020-03-23 DIAGNOSIS — J3089 Other allergic rhinitis: Secondary | ICD-10-CM | POA: Diagnosis not present

## 2020-03-23 DIAGNOSIS — R262 Difficulty in walking, not elsewhere classified: Secondary | ICD-10-CM

## 2020-03-23 DIAGNOSIS — G319 Degenerative disease of nervous system, unspecified: Secondary | ICD-10-CM

## 2020-03-23 NOTE — Therapy (Addendum)
[place] MAIN [facility] SERVICES [address] [place], [place], [address] Phone: [phone]   Fax:  [phone]  Speech Language Pathology Treatment Re-Certification - Change in Plan of Care  Patient Details  Name: Todd Golden MRN: [id number] Date of Birth: July 18, 1930 Referring Provider (SLP): Fara Olden   Encounter Date: 03/23/2020   End of Session - 03/23/20 1119    Visit Number 12    Number of Visits 36    Date for SLP Re-Evaluation 06/16/20    Authorization Type [employer]    Authorization Time Period 03/23/2020 thru 06/16/2020    Authorization - Visit Number 2    Progress Note Due on Visit 10    SLP Start Time 1000    SLP Stop Time  1100    SLP Time Calculation (min) 60 min    Activity Tolerance Patient tolerated treatment well           Past Medical History:  Diagnosis Date  . Allergy   . Arthritis   . Asthma   . Cataract   . Chronic constipation   . Colon polyps   . Cough   . GERD (gastroesophageal reflux disease)   . Glaucoma   . HOH (hard of hearing)    a. Uses hearing aids  . Hyperlipidemia   . Hypertension   . Macular degeneration   . Nephrolithiasis    stent  . PAF (paroxysmal atrial fibrillation) ([place])    a. 11/2013 Dx in setting of L MCA stroke. CHA2DS2VASc = 5-->Eliquis; b. 11/2013 Echo: Nl LV fxn. Mildly dil LA.  . Stroke ([place])    a. 11/2013 L MCA stroke.  Marland Kitchen Ureteral stricture     Past Surgical History:  Procedure Laterality Date  . CATARACT EXTRACTION W/PHACO Left 12/20/2014   Procedure: CATARACT EXTRACTION PHACO AND INTRAOCULAR LENS PLACEMENT (IOC);  Surgeon: Birder Robson, MD;  Location: [facility] ORS;  Service: Ophthalmology;  Laterality: Left;   [place]     00:59.6 AP%    21.2 CDE    12.66 fluid pack lot #[id number] H  . JOINT REPLACEMENT Left 2014   knee partial replacement  . kidney stent Right 1995    There were no vitals filed for this visit.   Subjective Assessment - 03/23/20  1118    Subjective pt pleasant, marked difficulty with expressive abilities    Patient is accompained by: Family member    Currently in Pain? No/denies            Neuro   ST   TX   NOTE          Treatment Data and Patient's Response to Treatment   Pt arrived to session reporting new information regarding his eye doctor's appt on Wednesday. Pt required his wife's assistance to communicate that he no longer needs injections in his right eye but now requires injections in his left eye as that eye now as macular degeneration.   Skilled ST session targeted pt's expressive language goals, specifically word finding. SLP facilitated session by providing maximal assistance to generate items with categories found on pages 33-34 [facility] book 8).  Given difficulty with mental flexibility and lack of carry over with cognitive linguistic tasks, pt requires shift in focus from restorative to compensatory therapy. Noted MRI for advanced atrophy. Skilled ST is required to help pt and his wife compensate using functional strategies to promote increased ability to express basic wants and needs.       Noted noted MRI  for advanced atrophy.    SLP Education - 03/23/20 1118    Education Details naming within categories    Person(s) Educated Patient;Spouse    Methods Explanation;Demonstration;Verbal cues    Comprehension Verbalized understanding;Returned demonstration;Verbal cues required;Need further instruction            SLP Short Term Goals - 03/24/20 1029      SLP SHORT TERM GOAL #1   Title With minimal cues, pt will read list of 10 functional phrases at slow rate to reduce dysfluencies to < 25%.    Baseline revised see new STG 5    Status Revised see STG 6     SLP SHORT TERM GOAL #2   Title Given maximal support, pt will demonstrate emergent awareness of dysfluencies by self-monitoring and self-correcting dysfluencies in 5 out of 10 opportunities.    Status Deferred      SLP SHORT TERM GOAL #3    Title With minimal to moderate assistance, pt will generate 6 items in a basic category in 7 out of 10 opportunities.    Baseline Revises see STG 7      SLP SHORT TERM GOAL #5   Title With minimal cues, pt will read list of 5 functional phrases specific to pt using a slow rate to reduce dysfluencies to < 50%.    Baseline maximal cues for slow rate    Time 10    Period --   sessions   Status New      Additional Short Term Goals   Additional Short Term Goals Yes      SLP SHORT TERM GOAL #6   Title With minimal assistance, pt will generate 6 items in functional/familiar category in 7 out of 10 opportunities.    Baseline moderate to maximal assistance    Time 10    Period --   sessions   Status New            SLP Long Term Goals - 03/24/20 1040      SLP LONG TERM GOAL #1   Title Pt will utilize speech intelligibility strategies to achieve > 50% intelligibility at the simple sentences level in 5 of 10 opportunities.    Baseline revised from previous goal    Status Revised    Target Date 06/16/20      SLP LONG TERM GOAL #2   Title Pt will utilize word finding strategies to convey basic wants and needs in 50% of opportunities.    Baseline revised    Time 12    Period Weeks    Status Revised    Target Date 06/16/20            Plan - 03/23/20 1119        Speech Therapy Frequency 2x / week    Duration 12 weeks    Treatment/Interventions Compensatory techniques;SLP instruction and feedback;Patient/family education;Language facilitation;Functional tasks;Internal/external aids;Cueing hierarchy    Potential to Achieve Goals Fair    Potential Considerations Co-morbidities;Medical prognosis;Severity of impairments   hx of CVA, recent MRI revealed advanced atrophy   Consulted and Agree with Plan of Care Patient;Family member/caregiver    Family Member Consulted pt's wife           Patient will benefit from skilled therapeutic intervention in order to improve the following  deficits and impairments:   Broca's aphasia  Diffuse brain atrophy [facility])  Aphasia  Apraxia    Problem List Patient Active Problem List   Diagnosis Date Noted  .  Diffuse brain atrophy ([place]) 12/09/2019  . Balance problem 12/03/2019  . Acquired thrombophilia ([place]) 12/03/2019  . Prediabetes 05/29/2019  . Benign prostatic hyperplasia with urinary frequency 08/20/2018  . Hearing aid worn 08/16/2017  . Paronychia of finger, left 08/16/2017  . Broca's aphasia 08/16/2017  . History of cataract surgery 08/27/2016  . Age-related macular degeneration 08/27/2016  . Counseling regarding end of life decision making 08/27/2016  . Do not resuscitate status 08/27/2016  . BPH associated with nocturia 02/11/2015  . PAF (paroxysmal atrial fibrillation) ([place]) 12/14/2013  . Hyperlipidemia LDL goal <100 12/12/2013  . CVD (cerebrovascular disease) 12/10/2013  . GERD (gastroesophageal reflux disease) 07/25/2013  . Left arm numbness 07/25/2013  . Chronic constipation 01/21/2013  . Encounter for preventive health examination 01/21/2013  . Essential hypertension, benign 10/21/2012  . Glaucoma 10/21/2012  . History of colonic polyps 10/21/2012  . Ureteral stricture, right 10/21/2012   Pippa Hanif B. Rutherford Nail M.S., CCC-SLP, [place] Pathologist Rehabilitation Services Office [phone]  Stormy Fabian 03/24/2020, 10:57 AM  [place] MAIN [facility] SERVICES [address] [place], [place], [address] Phone: [phone]   Fax:  [phone]   Name: Todd Golden MRN: [id number] Date of Birth: 10-Aug-1930

## 2020-03-24 NOTE — Addendum Note (Signed)
Addended by: Stormy Fabian B on: 03/24/2020 10:59 AM   Modules accepted: Orders

## 2020-03-24 NOTE — Therapy (Signed)
[place] MAIN [facility] SERVICES [place], [place], [address] Phone: [phone]   Fax:  [phone]  Physical Therapy Treatment/Physical Therapy Progress Note   Dates of reporting period 02/17/2020 to   03/23/2020  Patient Details  Name: Todd Golden MRN: [id number] Date of Birth: Jun 10, 1930 Referring Provider (PT): Dr. Deborra Medina   Encounter Date: 03/23/2020   PT End of Session - 03/23/20 0801    Visit Number 10    Number of Visits 25    Date for PT Re-Evaluation 05/11/20    Authorization Type 02/17/2020- Initial PT evaluation    PT Start Time 1103    PT Stop Time 1144    PT Time Calculation (min) 41 min    Equipment Utilized During Treatment Gait belt    Activity Tolerance Patient tolerated treatment well    Behavior During Therapy [facility] for tasks assessed/performed           Past Medical History:  Diagnosis Date  . Allergy   . Arthritis   . Asthma   . Cataract   . Chronic constipation   . Colon polyps   . Cough   . GERD (gastroesophageal reflux disease)   . Glaucoma   . HOH (hard of hearing)    a. Uses hearing aids  . Hyperlipidemia   . Hypertension   . Macular degeneration   . Nephrolithiasis    stent  . PAF (paroxysmal atrial fibrillation) ([place])    a. 11/2013 Dx in setting of L MCA stroke. CHA2DS2VASc = 5-->Eliquis; b. 11/2013 Echo: Nl LV fxn. Mildly dil LA.  . Stroke ([place])    a. 11/2013 L MCA stroke.  Marland Kitchen Ureteral stricture     Past Surgical History:  Procedure Laterality Date  . CATARACT EXTRACTION W/PHACO Left 12/20/2014   Procedure: CATARACT EXTRACTION PHACO AND INTRAOCULAR LENS PLACEMENT (IOC);  Surgeon: Birder Robson, MD;  Location: [facility] ORS;  Service: Ophthalmology;  Laterality: Left;   [place]     00:59.6 AP%    21.2 CDE    12.66 fluid pack lot #[id number]  . JOINT REPLACEMENT Left 2014   knee partial replacement  . kidney stent Right 1995    There were no vitals filed for this visit.    Subjective Assessment - 03/24/20 0759    Subjective Patient reports that he believes he is stronger and definitely walking better.    Limitations Walking    Patient Stated Goals Patient reports his goal not to fall and regain some strength in his legs.    Currently in Pain? No/denies          Reassessed goals for progress note: See goal section. Patient did improve with TUG score without an AD today as well as much improved 5xSTS score= 15.57 sec.    Neuromuscular re-ed:  Patient performed the [place] system activities:  Static standing (center is target goal) score is measured as distance time away from center 1) 1074 2) 765 3) 671 4) 639 Patient demonstrated progress with each trial without use of UE support.   Static stand with no UE support and Eyes closed 1) 3568 2) 3537 3) 2916 Again- improvement with each trial yet much more unsteady than eyes open  Circular maze x 5 min -Patient with mild difficulty with posterior lean- decreased balance reaction.  In //bars: Patient performed side step onto purple pad then 6"step then onto green pad left to right then back x 5 trials with 1 HHA on  bar followed by 5 trials without UE support  With increased unsteadiness yet no signficant difficulty.                         PT Education - 03/24/20 0801    Education Details specific balance exercise technique    Person(s) Educated Patient    Methods Explanation;Verbal cues;Demonstration    Comprehension Verbalized understanding;Returned demonstration;Verbal cues required            PT Short Term Goals - 03/23/20 1132      PT SHORT TERM GOAL #1   Title Patient will be independent in home exercise program to improve strength/mobility for better functional independence with ADLs.    Baseline 02/17/2020- Patient has no formal HEP in place. 03/23/2020- Pt reports mostly compliant with walking program and will benefit from continued balance HEP.    Time 6     Period Weeks    Status On-going    Target Date 03/30/20             PT Long Term Goals - 03/23/20 1134      PT LONG TERM GOAL #1   Title Patient will increase FOTO score to equal to or greater than 69 to demonstrate statistically significant improvement in mobility and quality of life.    Baseline 02/17/2020= 59    Time 12    Period Weeks    Status New      PT LONG TERM GOAL #2   Title Patient (> 58 years old) will complete five times sit to stand test in < 15 seconds indicating an increased LE strength and improved balance.    Baseline 02/17/2020- 22 sec with no UE support. 03/23/2020- 15.57 sec without UE support    Time 12    Period Weeks    Status New      PT LONG TERM GOAL #3   Title Pt will improve BERG from 50/56 to 53/56 or higher in order to demonstrate clinically significant improvement in balance.    Baseline 02/17/2020= 50/56    Time 12    Period Weeks    Status New      PT LONG TERM GOAL #4   Title Pt will decrease TUG to below 11 seconds/decrease in order to demonstrate decreased fall risk.    Baseline 02/17/20- 13 sec without an assistive device. 03/23/2020- TUG= 12.5 sec without UE support    Time 12    Period Weeks    Status New      PT LONG TERM GOAL #5   Title Patient will demonstrate improved overall ambulation > 800 feet on all surfaces without an assistive device without loss of balance for improved community distances.    Baseline 02/17/2020-Patient reports limited ambulation in [place] community to approx 1 block    Time 12    Period Weeks    Status New                 Plan - 03/23/20 0802    Clinical Impression Statement Overall patient is making great progress with improved functional strength and mobility. He demonstrated progress with the 5 time sit to stand test and the Timed up and go with no reported falls. He responded well to all balance exercises and presents with visually preferenced today with increased unsteadiness with eyes  closed activity.  Patient's condition has the potential to improve in response to therapy. Maximum improvement is yet to be obtained. The  anticipated improvement is attainable and reasonable in a generally predictable time.    Personal Factors and Comorbidities Age;Comorbidity 1    Comorbidities HTN    Examination-Activity Limitations Reach Overhead;Stairs;Dressing;Carry;Locomotion Level    Examination-Participation Restrictions Yard Work    Stability/Clinical Decision Making Stable/Uncomplicated    Rehab Potential Good    PT Frequency 2x / week    PT Duration 12 weeks    PT Treatment/Interventions Cryotherapy;Moist Heat;DME Instruction;Gait training;Stair training;Functional mobility training;Therapeutic activities;Therapeutic exercise;Balance training;Neuromuscular re-education;Patient/family education;Manual techniques;Passive range of motion    PT Next Visit Plan Continue with progressive LE strengthening, balance, gait training (outdoors next week- weather permitting)    PT Home Exercise Plan no changes.    Consulted and Agree with Plan of Care Patient           Patient will benefit from skilled therapeutic intervention in order to improve the following deficits and impairments:  Abnormal gait,Decreased activity tolerance,Cardiopulmonary status limiting activity,Decreased balance,Decreased endurance,Decreased mobility,Decreased strength,Difficulty walking,Impaired UE functional use  Visit Diagnosis: Muscle weakness (generalized)  Difficulty in walking, not elsewhere classified     Problem List Patient Active Problem List   Diagnosis Date Noted  . Diffuse brain atrophy ([place]) 12/09/2019  . Balance problem 12/03/2019  . Acquired thrombophilia ([place]) 12/03/2019  . Prediabetes 05/29/2019  . Benign prostatic hyperplasia with urinary frequency 08/20/2018  . Hearing aid worn 08/16/2017  . Paronychia of finger, left 08/16/2017  . Broca's aphasia 08/16/2017  . History of cataract  surgery 08/27/2016  . Age-related macular degeneration 08/27/2016  . Counseling regarding end of life decision making 08/27/2016  . Do not resuscitate status 08/27/2016  . BPH associated with nocturia 02/11/2015  . PAF (paroxysmal atrial fibrillation) ([place]) 12/14/2013  . Hyperlipidemia LDL goal <100 12/12/2013  . CVD (cerebrovascular disease) 12/10/2013  . GERD (gastroesophageal reflux disease) 07/25/2013  . Left arm numbness 07/25/2013  . Chronic constipation 01/21/2013  . Encounter for preventive health examination 01/21/2013  . Essential hypertension, benign 10/21/2012  . Glaucoma 10/21/2012  . History of colonic polyps 10/21/2012  . Ureteral stricture, right 10/21/2012    Lewis Moccasin, PT 03/24/2020, 8:27 AM  [place] MAIN [facility] SERVICES [address] [place], [place], [address] Phone: [phone]   Fax:  [phone]  Name: Todd Golden MRN: [id number] Date of Birth: [birth year]

## 2020-03-28 ENCOUNTER — Ambulatory Visit: Payer: PPO

## 2020-03-28 ENCOUNTER — Other Ambulatory Visit: Payer: Self-pay

## 2020-03-28 ENCOUNTER — Ambulatory Visit: Payer: PPO | Admitting: Speech Pathology

## 2020-03-28 DIAGNOSIS — J301 Allergic rhinitis due to pollen: Secondary | ICD-10-CM | POA: Diagnosis not present

## 2020-03-28 DIAGNOSIS — M6281 Muscle weakness (generalized): Secondary | ICD-10-CM | POA: Diagnosis not present

## 2020-03-28 DIAGNOSIS — R262 Difficulty in walking, not elsewhere classified: Secondary | ICD-10-CM

## 2020-03-28 DIAGNOSIS — J3089 Other allergic rhinitis: Secondary | ICD-10-CM | POA: Diagnosis not present

## 2020-03-28 DIAGNOSIS — J3081 Allergic rhinitis due to animal (cat) (dog) hair and dander: Secondary | ICD-10-CM | POA: Diagnosis not present

## 2020-03-28 DIAGNOSIS — G319 Degenerative disease of nervous system, unspecified: Secondary | ICD-10-CM

## 2020-03-28 DIAGNOSIS — R41841 Cognitive communication deficit: Secondary | ICD-10-CM

## 2020-03-28 NOTE — Therapy (Signed)
[place] MAIN [facility] SERVICES [address] [place], [place], [address] Phone: [phone]   Fax:  [phone]  Speech Language Pathology Treatment  Patient Details  Name: Todd Golden MRN: [id number] Date of Birth: 1930-03-27 Referring Provider (SLP): Fara Olden   Encounter Date: 03/28/2020   End of Session - 03/28/20 1230    Visit Number 13    Number of Visits 90    Date for SLP Re-Evaluation 06/16/20    Authorization Type [employer]    Authorization Time Period 03/23/2020 thru 06/16/2020    Authorization - Visit Number 3    Progress Note Due on Visit 10    SLP Start Time 0800    SLP Stop Time  0845    SLP Time Calculation (min) 45 min    Activity Tolerance Patient tolerated treatment well           Past Medical History:  Diagnosis Date  . Allergy   . Arthritis   . Asthma   . Cataract   . Chronic constipation   . Colon polyps   . Cough   . GERD (gastroesophageal reflux disease)   . Glaucoma   . HOH (hard of hearing)    a. Uses hearing aids  . Hyperlipidemia   . Hypertension   . Macular degeneration   . Nephrolithiasis    stent  . PAF (paroxysmal atrial fibrillation) ([place])    a. 11/2013 Dx in setting of L MCA stroke. CHA2DS2VASc = 5-->Eliquis; b. 11/2013 Echo: Nl LV fxn. Mildly dil LA.  . Stroke ([place])    a. 11/2013 L MCA stroke.  Marland Kitchen Ureteral stricture     Past Surgical History:  Procedure Laterality Date  . CATARACT EXTRACTION W/PHACO Left 12/20/2014   Procedure: CATARACT EXTRACTION PHACO AND INTRAOCULAR LENS PLACEMENT (IOC);  Surgeon: Birder Robson, MD;  Location: [facility] ORS;  Service: Ophthalmology;  Laterality: Left;   [place]     00:59.6 AP%    21.2 CDE    12.66 fluid pack lot #[id number] H  . JOINT REPLACEMENT Left 2014   knee partial replacement  . kidney stent Right 1995    There were no vitals filed for this visit.   Subjective Assessment - 03/28/20 1227    Subjective "My wife says I am  talking much better but I still can't remember anything and I don't know why"    Currently in Pain? No/denies          Neuro   ST   TX   NOTE          Treatment Data and Patient's Response to Treatment   Skilled treatment sesion targeted pt's cognitive communication goals. Pt arrived to session in good spirits and immediately reports "[name] says that I am talking better!" He further reported, "sometimes I still can't think of the word so I just another word." This was evident multiple times within the session: "old people's home" instead of nursing home. Throughout the 45 minute session, pt effectively communicated all information with > 80% listener understanding. His speech did contain some false starts, circumlocutions and apraxia but pt was much improved.   Pt continues to enjoy talking lots of stories about his family and he demonstrated great improvement during this session. Skilled ST intervention continues to be required to promote carry over of the above abilities into multiple topics (medical topics pertaining to pt) with multiple listeners.         SLP Education - 03/28/20 1230  Education Details provided on good use of word finding strategies to increase word finding abilities    Person(s) Educated Patient    Methods Explanation    Comprehension Verbalized understanding;Returned demonstration            SLP Short Term Goals - 03/24/20 1029      SLP SHORT TERM GOAL #1   Title With minimal cues, pt will read list of 10 functional phrases at slow rate to reduce dysfluencies to < 25%.    Baseline revised see new STG 5    Status Revised      SLP SHORT TERM GOAL #2   Title Given maximal support, pt will demonstrate emergent awareness of dysfluencies by self-monitoring and self-correcting dysfluencies in 5 out of 10 opportunities.    Status Deferred      SLP SHORT TERM GOAL #3   Title With minimal to moderate assistance, pt will generate 6 items in a basic category in 7  out of 10 opportunities.    Baseline Revises see STG 7      SLP SHORT TERM GOAL #5   Title With minimal cues, pt will read list of 5 functional phrases specific to pt using a slow rate to reduce dysfluencies to < 50%.    Baseline maximal cues for slow rate    Time 10    Period --   sessions   Status New      Additional Short Term Goals   Additional Short Term Goals Yes      SLP SHORT TERM GOAL #6   Title With minimal assistance, pt will generate 6 items in functional/familiar category in 7 out of 10 opportunities.    Baseline moderate to maximal assistance    Time 10    Period --   sessions   Status New            SLP Long Term Goals - 03/24/20 1040      SLP LONG TERM GOAL #1   Title Pt will utilize speech intelligibility strategies to achieve > 50% intelligibility at the simple sentences level in 5 of 10 opportunities.    Baseline revised from previous goal    Status Revised    Target Date 06/16/20      SLP LONG TERM GOAL #2   Title Pt will utilize word finding strategies to convey basic wants and needs in 50% of opportunities.    Baseline revised    Time 12    Period Weeks    Status Revised    Target Date 06/16/20            Plan - 03/28/20 1231    Speech Therapy Frequency 2x / week    Duration 12 weeks    Treatment/Interventions Compensatory techniques;SLP instruction and feedback;Patient/family education;Language facilitation;Functional tasks;Internal/external aids;Cueing hierarchy    Potential to Achieve Goals Fair    Potential Considerations Co-morbidities;Medical prognosis;Severity of impairments   Recent MRI - advanced atrophy   Consulted and Agree with Plan of Care Patient           Patient will benefit from skilled therapeutic intervention in order to improve the following deficits and impairments:   Cognitive communication deficit  Diffuse brain atrophy [facility])    Problem List Patient Active Problem List   Diagnosis Date Noted  . Diffuse  brain atrophy ([place]) 12/09/2019  . Balance problem 12/03/2019  . Acquired thrombophilia ([place]) 12/03/2019  . Prediabetes 05/29/2019  . Benign prostatic hyperplasia with urinary frequency 08/20/2018  .  Hearing aid worn 08/16/2017  . Paronychia of finger, left 08/16/2017  . Broca's aphasia 08/16/2017  . History of cataract surgery 08/27/2016  . Age-related macular degeneration 08/27/2016  . Counseling regarding end of life decision making 08/27/2016  . Do not resuscitate status 08/27/2016  . BPH associated with nocturia 02/11/2015  . PAF (paroxysmal atrial fibrillation) ([place]) 12/14/2013  . Hyperlipidemia LDL goal <100 12/12/2013  . CVD (cerebrovascular disease) 12/10/2013  . GERD (gastroesophageal reflux disease) 07/25/2013  . Left arm numbness 07/25/2013  . Chronic constipation 01/21/2013  . Encounter for preventive health examination 01/21/2013  . Essential hypertension, benign 10/21/2012  . Glaucoma 10/21/2012  . History of colonic polyps 10/21/2012  . Ureteral stricture, right 10/21/2012   Price Lachapelle B. Rutherford Nail M.S., CCC-SLP, [place] Pathologist Rehabilitation Services Office [phone]   Stormy Fabian 03/28/2020, 12:33 PM  [place] MAIN [facility] SERVICES [address] [place], [place], [address] Phone: [phone]   Fax:  [phone]   Name: Todd Golden MRN: [id number] Date of Birth: [birth year]

## 2020-03-28 NOTE — Therapy (Signed)
[place] MAIN [facility] SERVICES [address] [place], [place], [address] Phone: [phone]   Fax:  [phone]  Physical Therapy Treatment  Patient Details  Name: Todd Golden MRN: [id number] Date of Birth: Jul 14, 1930 Referring Provider (PT): Dr. Deborra Medina   Encounter Date: 03/28/2020   PT End of Session - 03/28/20 1239    Visit Number 11    Number of Visits 25    Date for PT Re-Evaluation 05/11/20    Authorization Type 02/17/2020- Initial PT evaluation    PT Start Time 0845    PT Stop Time 0929    PT Time Calculation (min) 44 min    Equipment Utilized During Treatment Gait belt    Activity Tolerance Patient tolerated treatment well    Behavior During Therapy [facility] for tasks assessed/performed           Past Medical History:  Diagnosis Date  . Allergy   . Arthritis   . Asthma   . Cataract   . Chronic constipation   . Colon polyps   . Cough   . GERD (gastroesophageal reflux disease)   . Glaucoma   . HOH (hard of hearing)    a. Uses hearing aids  . Hyperlipidemia   . Hypertension   . Macular degeneration   . Nephrolithiasis    stent  . PAF (paroxysmal atrial fibrillation) ([place])    a. 11/2013 Dx in setting of L MCA stroke. CHA2DS2VASc = 5-->Eliquis; b. 11/2013 Echo: Nl LV fxn. Mildly dil LA.  . Stroke ([place])    a. 11/2013 L MCA stroke.  Marland Kitchen Ureteral stricture     Past Surgical History:  Procedure Laterality Date  . CATARACT EXTRACTION W/PHACO Left 12/20/2014   Procedure: CATARACT EXTRACTION PHACO AND INTRAOCULAR LENS PLACEMENT (IOC);  Surgeon: Birder Robson, MD;  Location: [facility] ORS;  Service: Ophthalmology;  Laterality: Left;   [place]     00:59.6 AP%    21.2 CDE    12.66 fluid pack lot #[id number] H  . JOINT REPLACEMENT Left 2014   knee partial replacement  . kidney stent Right 1995    There were no vitals filed for this visit.   Subjective Assessment - 03/28/20 1238    Subjective Patient reports he came by himself  today. Has no falls or LOB since last session.    Limitations Walking    Patient Stated Goals Patient reports his goal not to fall and regain some strength in his legs.    Currently in Pain? No/denies               Neuromuscular re-education:  next to support surface:   side stepping across orange hurdle x 10x each LE, no UE support with CGA - Patient improved with steadiness with increased trials.  Forward stepping over orange hurdle 10x each LE, SUE support.    Forward/backward gait in //Bars- stepping onto strategically placed fabric on floor for feedback. Patient demo difficulty with backward gait and unable to extend hip for backward step well despite VC and feedback of fabric for placement- He exhibited no difficulty with forward gait however- stepping smoothly onto fabric squares.    Ambulate in hallway:  -horizontal head turns 160 ft close CGA -vertical head turns 160 ft close CGA, excessive posterior trunk LOB -initiation/termination of gait 160 ft, two near LOB  Steps:6" step :  Toe taps no UE support 12x each LE; no UE support    Speed ladder: one foot each square 4x each  length of // bar. Two near LOBs requiring close CGA.   Ambulate with negotiation of obstacles, safe use of elevator, outside terrain, safe negotiation of getting into/out of car with CGA. Patien requires cueing for safety awareness and L foot clearance. .    TherEx: Seated:  GTB 4 way ankle each LE 10x each LE   Patient performed static center training with verbal cues and feedback of video screen. He demonstrated increased fatigue with session and experienced increased difficulty maintaining center at end of session.                          PT Education - 03/28/20 1238    Education Details exercise technique, body mechanics    Person(s) Educated Patient    Methods Explanation;Demonstration;Tactile cues;Verbal cues    Comprehension Verbalized understanding;Returned  demonstration;Verbal cues required;Tactile cues required            PT Short Term Goals - 03/23/20 1132      PT SHORT TERM GOAL #1   Title Patient will be independent in home exercise program to improve strength/mobility for better functional independence with ADLs.    Baseline 02/17/2020- Patient has no formal HEP in place. 03/23/2020- Pt reports mostly compliant with walking program and will benefit from continued balance HEP.    Time 6    Period Weeks    Status On-going    Target Date 03/30/20             PT Long Term Goals - 03/23/20 1134      PT LONG TERM GOAL #1   Title Patient will increase FOTO score to equal to or greater than 69 to demonstrate statistically significant improvement in mobility and quality of life.    Baseline 02/17/2020= 59    Time 12    Period Weeks    Status New      PT LONG TERM GOAL #2   Title Patient (87 years old) will complete five times sit to stand test in < 15 seconds indicating an increased LE strength and improved balance.    Baseline 02/17/2020- 22 sec with no UE support. 03/23/2020- 15.57 sec without UE support    Time 12    Period Weeks    Status New      PT LONG TERM GOAL #3   Title Pt will improve BERG from 50/56 to 53/56 or higher in order to demonstrate clinically significant improvement in balance.    Baseline 02/17/2020= 50/56    Time 12    Period Weeks    Status New      PT LONG TERM GOAL #4   Title Pt will decrease TUG to below 11 seconds/decrease in order to demonstrate decreased fall risk.    Baseline 02/17/20- 13 sec without an assistive device. 03/23/2020- TUG= 12.5 sec without UE support    Time 12    Period Weeks    Status New      PT LONG TERM GOAL #5   Title Patient will demonstrate improved overall ambulation > 800 feet on all surfaces without an assistive device without loss of balance for improved community distances.    Baseline 02/17/2020-Patient reports limited ambulation in [place] to approx  1 block    Time 12    Period Weeks    Status New                 Plan - 03/28/20 1241  Clinical Impression Statement Patient is eager to participate with physical therapy session. He requires frequent task orientation and sequencing cues. Due to patient being more unsteady than normal he was assisted back to car and practiced safe car transfers with education on safety awareness and negotiation of changing terrains. He will continue to benefit from skilled PT interventions to continue to progress his strength, balance and mobility to reduce risk of falling and restore indpendent mobility    Personal Factors and Comorbidities Age;Comorbidity 1    Comorbidities HTN    Examination-Activity Limitations Reach Overhead;Stairs;Dressing;Carry;Locomotion Level    Examination-Participation Restrictions Yard Work    Stability/Clinical Decision Making Stable/Uncomplicated    Rehab Potential Good    PT Frequency 2x / week    PT Duration 12 weeks    PT Treatment/Interventions Cryotherapy;Moist Heat;DME Instruction;Gait training;Stair training;Functional mobility training;Therapeutic activities;Therapeutic exercise;Balance training;Neuromuscular re-education;Patient/family education;Manual techniques;Passive range of motion    PT Next Visit Plan Continue with progressive LE strengthening, balance, gait training (outdoors next week- weather permitting)    PT Home Exercise Plan no changes.    Consulted and Agree with Plan of Care Patient           Patient will benefit from skilled therapeutic intervention in order to improve the following deficits and impairments:  Abnormal gait,Decreased activity tolerance,Cardiopulmonary status limiting activity,Decreased balance,Decreased endurance,Decreased mobility,Decreased strength,Difficulty walking,Impaired UE functional use  Visit Diagnosis: Muscle weakness (generalized)  Difficulty in walking, not elsewhere classified     Problem List Patient  Active Problem List   Diagnosis Date Noted  . Diffuse brain atrophy ([place]) 12/09/2019  . Balance problem 12/03/2019  . Acquired thrombophilia ([place]) 12/03/2019  . Prediabetes 05/29/2019  . Benign prostatic hyperplasia with urinary frequency 08/20/2018  . Hearing aid worn 08/16/2017  . Paronychia of finger, left 08/16/2017  . Broca's aphasia 08/16/2017  . History of cataract surgery 08/27/2016  . Age-related macular degeneration 08/27/2016  . Counseling regarding end of life decision making 08/27/2016  . Do not resuscitate status 08/27/2016  . BPH associated with nocturia 02/11/2015  . PAF (paroxysmal atrial fibrillation) ([place]) 12/14/2013  . Hyperlipidemia LDL goal <100 12/12/2013  . CVD (cerebrovascular disease) 12/10/2013  . GERD (gastroesophageal reflux disease) 07/25/2013  . Left arm numbness 07/25/2013  . Chronic constipation 01/21/2013  . Encounter for preventive health examination 01/21/2013  . Essential hypertension, benign 10/21/2012  . Glaucoma 10/21/2012  . History of colonic polyps 10/21/2012  . Ureteral stricture, right 10/21/2012   Janna Arch, PT, DPT   03/28/2020, 12:42 PM  [place] MAIN [facility] SERVICES [address] [place], [place], [address] Phone: [phone]   Fax:  [phone]  Name: Todd Golden MRN: [id number] Date of Birth: 1930/01/25

## 2020-03-30 ENCOUNTER — Other Ambulatory Visit: Payer: Self-pay

## 2020-03-30 ENCOUNTER — Ambulatory Visit: Payer: PPO | Admitting: Speech Pathology

## 2020-03-30 DIAGNOSIS — M6281 Muscle weakness (generalized): Secondary | ICD-10-CM | POA: Diagnosis not present

## 2020-03-30 DIAGNOSIS — R41841 Cognitive communication deficit: Secondary | ICD-10-CM

## 2020-03-30 DIAGNOSIS — G319 Degenerative disease of nervous system, unspecified: Secondary | ICD-10-CM

## 2020-03-31 ENCOUNTER — Ambulatory Visit: Payer: PPO

## 2020-03-31 ENCOUNTER — Ambulatory Visit: Payer: PPO | Admitting: Speech Pathology

## 2020-03-31 NOTE — Therapy (Signed)
[place] MAIN [facility] SERVICES [address] [place], [place], [address] Phone: [phone]   Fax:  [phone]  Speech Language Pathology Treatment  Patient Details  Name: Todd Golden MRN: [id number] Date of Birth: [birth year] Referring Provider (SLP): Fara Olden   Encounter Date: 03/30/2020   End of Session - 03/31/20 0722    Visit Number 14    Number of Visits 36    Date for SLP Re-Evaluation 06/16/20    Authorization Type [employer]    Authorization Time Period 03/23/2020 thru 06/16/2020    Authorization - Visit Number 4    Progress Note Due on Visit 10    SLP Start Time 0800    SLP Stop Time  0845    SLP Time Calculation (min) 45 min    Activity Tolerance Patient tolerated treatment well           Past Medical History:  Diagnosis Date  . Allergy   . Arthritis   . Asthma   . Cataract   . Chronic constipation   . Colon polyps   . Cough   . GERD (gastroesophageal reflux disease)   . Glaucoma   . HOH (hard of hearing)    a. Uses hearing aids  . Hyperlipidemia   . Hypertension   . Macular degeneration   . Nephrolithiasis    stent  . PAF (paroxysmal atrial fibrillation) ([place])    a. 11/2013 Dx in setting of L MCA stroke. CHA2DS2VASc = 5-->Eliquis; b. 11/2013 Echo: Nl LV fxn. Mildly dil LA.  . Stroke ([place])    a. 11/2013 L MCA stroke.  Marland Kitchen Ureteral stricture     Past Surgical History:  Procedure Laterality Date  . CATARACT EXTRACTION W/PHACO Left 12/20/2014   Procedure: CATARACT EXTRACTION PHACO AND INTRAOCULAR LENS PLACEMENT (IOC);  Surgeon: Birder Robson, MD;  Location: [facility] ORS;  Service: Ophthalmology;  Laterality: Left;   [place]     00:59.6 AP%    21.2 CDE    12.66 fluid pack lot #[id number] H  . JOINT REPLACEMENT Left 2014   knee partial replacement  . kidney stent Right 1995    There were no vitals filed for this visit.   Subjective Assessment - 03/31/20 0720    Subjective Pt accompanied by his  wife - "I don't understand why I can't remember anything"    Patient is accompained by: Family member    Currently in Pain? No/denies          Neuro   ST   TX   NOTE          Treatment Data and Patient's Response to Treatment   Skilled treatment session focused on pt's cognitive communication goals. SLP facilitated session by providing conversation started appropriate to pt, these included his garden, macular degeneration and [employer] basketball. During moments of dysfluent speech, pt was cued to slow rate of speech. This strategy was effective in increasing speech intelligibility. While pt states that he knows to slow down, verbal cues were not able to be faded. Additionally, when describing current [place] tournament, pt's description was not cohesive and thus very hard to follow. SLP provided demonstration of ways to organize thoughts to present in cohesive manner for increased listener understanding. Pt's wife accompanied him today. On several occasions pt showed progress with use of semantic descriptions in moments of word finding difficulty. However this strategy was ineffective as pt's recall of information was inaccurate. Pt and wife continue to voice concerns with pt's  memory. Encouraged them to schedule appt with neurologist. Education and demonstration was provided to help carryover the above strategies into daily speech. Pt's wife voiced understanding and returned demonstrated strategies. Pt's prognosis is guarded d/t difficulty with memory loss.   Pt and his wife continue to be eager participants in therapy. Skilled ST intervention continues to be required to target use of word finding strategies, speech intelligibility strategies and caregiver education. In doing so, pt will increase functional independence and reduce caregiver burden.          SLP Education - 03/31/20 2952    Education Details Strategies to improve speech intelligibility and organization of language    Person(s) Educated  Patient    Methods Explanation;Verbal cues    Comprehension Verbalized understanding;Returned demonstration;Need further instruction            SLP Short Term Goals - 03/24/20 1029      SLP SHORT TERM GOAL #1   Title With minimal cues, pt will read list of 10 functional phrases at slow rate to reduce dysfluencies to < 25%.    Baseline revised see new STG 5    Status Revised      SLP SHORT TERM GOAL #2   Title Given maximal support, pt will demonstrate emergent awareness of dysfluencies by self-monitoring and self-correcting dysfluencies in 5 out of 10 opportunities.    Status Deferred      SLP SHORT TERM GOAL #3   Title With minimal to moderate assistance, pt will generate 6 items in a basic category in 7 out of 10 opportunities.    Baseline Revises see STG 7      SLP SHORT TERM GOAL #5   Title With minimal cues, pt will read list of 5 functional phrases specific to pt using a slow rate to reduce dysfluencies to < 50%.    Baseline maximal cues for slow rate    Time 10    Period --   sessions   Status New      Additional Short Term Goals   Additional Short Term Goals Yes      SLP SHORT TERM GOAL #6   Title With minimal assistance, pt will generate 6 items in functional/familiar category in 7 out of 10 opportunities.    Baseline moderate to maximal assistance    Time 10    Period --   sessions   Status New            SLP Long Term Goals - 03/24/20 1040      SLP LONG TERM GOAL #1   Title Pt will utilize speech intelligibility strategies to achieve > 50% intelligibility at the simple sentences level in 5 of 10 opportunities.    Baseline revised from previous goal    Status Revised    Target Date 06/16/20      SLP LONG TERM GOAL #2   Title Pt will utilize word finding strategies to convey basic wants and needs in 50% of opportunities.    Baseline revised    Time 12    Period Weeks    Status Revised    Target Date 06/16/20            Plan - 03/31/20 0722     Speech Therapy Frequency 2x / week    Duration 12 weeks    Treatment/Interventions Compensatory techniques;SLP instruction and feedback;Patient/family education;Language facilitation;Functional tasks;Internal/external aids;Cueing hierarchy    Potential to Achieve Goals Fair    Potential Considerations Co-morbidities;Medical prognosis;Severity of  impairments    SLP Home Exercise Plan provided, see instruction section    Consulted and Agree with Plan of Care Patient;Family member/caregiver    Family Member Consulted pt's wife           Patient will benefit from skilled therapeutic intervention in order to improve the following deficits and impairments:   Cognitive communication deficit  Diffuse brain atrophy [facility])    Problem List Patient Active Problem List   Diagnosis Date Noted  . Diffuse brain atrophy ([place]) 12/09/2019  . Balance problem 12/03/2019  . Acquired thrombophilia ([place]) 12/03/2019  . Prediabetes 05/29/2019  . Benign prostatic hyperplasia with urinary frequency 08/20/2018  . Hearing aid worn 08/16/2017  . Paronychia of finger, left 08/16/2017  . Broca's aphasia 08/16/2017  . History of cataract surgery 08/27/2016  . Age-related macular degeneration 08/27/2016  . Counseling regarding end of life decision making 08/27/2016  . Do not resuscitate status 08/27/2016  . BPH associated with nocturia 02/11/2015  . PAF (paroxysmal atrial fibrillation) ([place]) 12/14/2013  . Hyperlipidemia LDL goal <100 12/12/2013  . CVD (cerebrovascular disease) 12/10/2013  . GERD (gastroesophageal reflux disease) 07/25/2013  . Left arm numbness 07/25/2013  . Chronic constipation 01/21/2013  . Encounter for preventive health examination 01/21/2013  . Essential hypertension, benign 10/21/2012  . Glaucoma 10/21/2012  . History of colonic polyps 10/21/2012  . Ureteral stricture, right 10/21/2012   Yasmine Kilbourne B. Rutherford Nail M.S., CCC-SLP, [place] Pathologist Rehabilitation  Services Office [phone]   Stormy Fabian 03/31/2020, 7:25 AM  [place] MAIN [facility] SERVICES [address] [place], [place], [address] Phone: [phone]   Fax:  [phone]   Name: Todd Golden MRN: [id number] Date of Birth: [birth year]

## 2020-03-31 NOTE — Patient Instructions (Signed)
Use strategies such as slow rate to improve speech intelligibility  Use strategies to improve language organization when describing unknown information

## 2020-04-01 ENCOUNTER — Other Ambulatory Visit: Payer: Self-pay | Admitting: Cardiovascular Disease

## 2020-04-03 ENCOUNTER — Other Ambulatory Visit: Payer: Self-pay

## 2020-04-03 MED ORDER — DOXAZOSIN MESYLATE 4 MG PO TABS: 4.0000 mg | ORAL_TABLET | Freq: Every day | ORAL | 0 refills | Status: DC

## 2020-04-03 NOTE — Telephone Encounter (Signed)
Scheduled

## 2020-04-03 NOTE — Telephone Encounter (Signed)
doxazosin (CARDURA) 4 MG tablet 90 tablet 0 04/03/2020    Sig - Route: Take 1 tablet (4 mg total) by mouth daily. - Oral     Pharmacy  [place], [place]

## 2020-04-03 NOTE — Telephone Encounter (Signed)
Please contact pt for future appointment. Pt due for 6 month f/u. 

## 2020-04-04 ENCOUNTER — Other Ambulatory Visit: Payer: Self-pay

## 2020-04-04 ENCOUNTER — Ambulatory Visit: Payer: PPO | Admitting: Speech Pathology

## 2020-04-04 ENCOUNTER — Encounter: Payer: Self-pay | Admitting: Physical Therapy

## 2020-04-04 ENCOUNTER — Ambulatory Visit: Payer: PPO

## 2020-04-04 DIAGNOSIS — J301 Allergic rhinitis due to pollen: Secondary | ICD-10-CM | POA: Diagnosis not present

## 2020-04-04 DIAGNOSIS — R262 Difficulty in walking, not elsewhere classified: Secondary | ICD-10-CM

## 2020-04-04 DIAGNOSIS — G319 Degenerative disease of nervous system, unspecified: Secondary | ICD-10-CM

## 2020-04-04 DIAGNOSIS — M6281 Muscle weakness (generalized): Secondary | ICD-10-CM

## 2020-04-04 DIAGNOSIS — J3081 Allergic rhinitis due to animal (cat) (dog) hair and dander: Secondary | ICD-10-CM | POA: Diagnosis not present

## 2020-04-04 DIAGNOSIS — R41841 Cognitive communication deficit: Secondary | ICD-10-CM

## 2020-04-04 DIAGNOSIS — J3089 Other allergic rhinitis: Secondary | ICD-10-CM | POA: Diagnosis not present

## 2020-04-04 NOTE — Patient Instructions (Signed)
Use slow rate and thought organization within daily conversations with his wife Wife to prompt pt with cues for both strategies

## 2020-04-04 NOTE — Therapy (Signed)
[place] MAIN [facility] SERVICES [address] [place], [place], [address] Phone: [phone]   Fax:  [phone]  Speech Language Pathology Treatment  Patient Details  Name: Todd Golden MRN: [id number] Date of Birth: 08/12/1930 Referring Provider (SLP): Fara Olden   Encounter Date: 04/04/2020   End of Session - 04/04/20 2040    Visit Number 15    Number of Visits 36    Date for SLP Re-Evaluation 06/16/20    Authorization Type [employer]    Authorization Time Period 03/23/2020 thru 06/16/2020    Authorization - Visit Number 5    Progress Note Due on Visit 10    SLP Start Time 0800    SLP Stop Time  0900    SLP Time Calculation (min) 60 min           Past Medical History:  Diagnosis Date  . Allergy   . Arthritis   . Asthma   . Cataract   . Chronic constipation   . Colon polyps   . Cough   . GERD (gastroesophageal reflux disease)   . Glaucoma   . HOH (hard of hearing)    a. Uses hearing aids  . Hyperlipidemia   . Hypertension   . Macular degeneration   . Nephrolithiasis    stent  . PAF (paroxysmal atrial fibrillation) ([place])    a. 11/2013 Dx in setting of L MCA stroke. CHA2DS2VASc = 5-->Eliquis; b. 11/2013 Echo: Nl LV fxn. Mildly dil LA.  . Stroke ([place])    a. 11/2013 L MCA stroke.  Marland Kitchen Ureteral stricture     Past Surgical History:  Procedure Laterality Date  . CATARACT EXTRACTION W/PHACO Left 12/20/2014   Procedure: CATARACT EXTRACTION PHACO AND INTRAOCULAR LENS PLACEMENT (IOC);  Surgeon: Birder Robson, MD;  Location: [facility] ORS;  Service: Ophthalmology;  Laterality: Left;   [place]     00:59.6 AP%    21.2 CDE    12.66 fluid pack lot #[id number]  . JOINT REPLACEMENT Left 2014   knee partial replacement  . kidney stent Right 1995    There were no vitals filed for this visit.   Subjective Assessment - 04/04/20 2038    Subjective "It is too early for talking" referring to time change    Patient is  accompained by: Family member    Currently in Pain? No/denies          Neuro   ST   TX   NOTE          Treatment Data and Patient's Response to Treatment   Skilled treatment session focused on pt's cognitive communication deficits. Pt arrived to session with his wife. They were pleasant, pt remarked, "it is too early to be talking" referring to time change. To facilitate pt's use of speech intelligibility and language strategies, SLP engaged pt in conversation about [facility] tournament and information about NCAA tournament. Initially, pt's speech was dysfluent but after several sentences, he began using a slower rate thus increasing his fluency and speech intelligibility. In conversation, it was evident that pt was having memory problems when talking about the NCAA tournament and NIT tournament. Pt continued to state that he "couldn't think of the word, I can not remember the name of that tournament (NIT) or where that team [facility]) is from." Pt's wife provided frequent gentle correction of information provided by pt d/t memory deficits. Pt further stated, "for the life of me, I don't understand why I can't remember recent  stuff but I can remember things that happened a long time ago."   Pt continues to demonstrate understanding and emerging ability to use speech intelligibility strategies with minimal to moderate cues. Pt and his wife report that when engaging in conversation in social situations, pt's speech is "aweful." This is likely d/t increased cognitive load during social conversations. Pt and his wife required skilled ST intervention to create additional communication strategies to increase pt's comfort during social situations.           SLP Education - 04/04/20 2040    Education Details cognitive communication deficits    Person(s) Educated Patient;Spouse    Methods Explanation;Demonstration;Verbal cues;Handout    Comprehension Need further instruction            SLP Short  Term Goals - 03/24/20 1029      SLP SHORT TERM GOAL #1   Title With minimal cues, pt will read list of 10 functional phrases at slow rate to reduce dysfluencies to < 25%.    Baseline revised see new STG 5    Status Revised      SLP SHORT TERM GOAL #2   Title Given maximal support, pt will demonstrate emergent awareness of dysfluencies by self-monitoring and self-correcting dysfluencies in 5 out of 10 opportunities.    Status Deferred      SLP SHORT TERM GOAL #3   Title With minimal to moderate assistance, pt will generate 6 items in a basic category in 7 out of 10 opportunities.    Baseline Revises see STG 7      SLP SHORT TERM GOAL #5   Title With minimal cues, pt will read list of 5 functional phrases specific to pt using a slow rate to reduce dysfluencies to < 50%.    Baseline maximal cues for slow rate    Time 10    Period --   sessions   Status New      Additional Short Term Goals   Additional Short Term Goals Yes      SLP SHORT TERM GOAL #6   Title With minimal assistance, pt will generate 6 items in functional/familiar category in 7 out of 10 opportunities.    Baseline moderate to maximal assistance    Time 10    Period --   sessions   Status New            SLP Long Term Goals - 03/24/20 1040      SLP LONG TERM GOAL #1   Title Pt will utilize speech intelligibility strategies to achieve > 50% intelligibility at the simple sentences level in 5 of 10 opportunities.    Baseline revised from previous goal    Status Revised    Target Date 06/16/20      SLP LONG TERM GOAL #2   Title Pt will utilize word finding strategies to convey basic wants and needs in 50% of opportunities.    Baseline revised    Time 12    Period Weeks    Status Revised    Target Date 06/16/20            Plan - 04/04/20 2042    Speech Therapy Frequency 2x / week    Duration 12 weeks    Treatment/Interventions Compensatory techniques;SLP instruction and feedback;Patient/family  education;Language facilitation;Functional tasks;Internal/external aids;Cueing hierarchy    Potential to Achieve Goals Fair    Potential Considerations Co-morbidities;Medical prognosis;Severity of impairments    SLP Home Exercise Plan provided, see instruction section  Consulted and Agree with Plan of Care Patient;Family member/caregiver    Family Member Consulted pt's wife           Patient will benefit from skilled therapeutic intervention in order to improve the following deficits and impairments:   Cognitive communication deficit  Diffuse brain atrophy [facility])    Problem List Patient Active Problem List   Diagnosis Date Noted  . Diffuse brain atrophy ([place]) 12/09/2019  . Balance problem 12/03/2019  . Acquired thrombophilia ([place]) 12/03/2019  . Prediabetes 05/29/2019  . Benign prostatic hyperplasia with urinary frequency 08/20/2018  . Hearing aid worn 08/16/2017  . Paronychia of finger, left 08/16/2017  . Broca's aphasia 08/16/2017  . History of cataract surgery 08/27/2016  . Age-related macular degeneration 08/27/2016  . Counseling regarding end of life decision making 08/27/2016  . Do not resuscitate status 08/27/2016  . BPH associated with nocturia 02/11/2015  . PAF (paroxysmal atrial fibrillation) ([place]) 12/14/2013  . Hyperlipidemia LDL goal <100 12/12/2013  . CVD (cerebrovascular disease) 12/10/2013  . GERD (gastroesophageal reflux disease) 07/25/2013  . Left arm numbness 07/25/2013  . Chronic constipation 01/21/2013  . Encounter for preventive health examination 01/21/2013  . Essential hypertension, benign 10/21/2012  . Glaucoma 10/21/2012  . History of colonic polyps 10/21/2012  . Ureteral stricture, right 10/21/2012   Caitlyn Buchanan B. Rutherford Nail M.S., CCC-SLP, [place] Pathologist Rehabilitation Services Office [phone]  Stormy Fabian 04/04/2020, 8:43 PM  [place] MAIN [facility] SERVICES [address]  [place], [place], [address] Phone: [phone]   Fax:  [phone]   Name: ROBI DEWOLFE MRN: [id number] Date of Birth: August 29, 1930

## 2020-04-04 NOTE — Therapy (Signed)
[place] MAIN [facility] SERVICES [address] [place], [place], [address] Phone: [phone]   Fax:  [phone]  Physical Therapy Treatment  Patient Details  Name: Todd Golden MRN: [id number] Date of Birth: [birth year] Referring Provider (PT): Dr. Deborra Medina   Encounter Date: 04/04/2020   PT End of Session - 04/04/20 0923    Visit Number 12    Number of Visits 25    Date for PT Re-Evaluation 05/11/20    Authorization Type 02/17/2020- Initial PT evaluation    PT Start Time 0906    PT Stop Time 0944    PT Time Calculation (min) 38 min    Equipment Utilized During Treatment Gait belt    Activity Tolerance Patient tolerated treatment well    Behavior During Therapy [facility] for tasks assessed/performed           Past Medical History:  Diagnosis Date  . Allergy   . Arthritis   . Asthma   . Cataract   . Chronic constipation   . Colon polyps   . Cough   . GERD (gastroesophageal reflux disease)   . Glaucoma   . HOH (hard of hearing)    a. Uses hearing aids  . Hyperlipidemia   . Hypertension   . Macular degeneration   . Nephrolithiasis    stent  . PAF (paroxysmal atrial fibrillation) ([place])    a. 11/2013 Dx in setting of L MCA stroke. CHA2DS2VASc = 5-->Eliquis; b. 11/2013 Echo: Nl LV fxn. Mildly dil LA.  . Stroke ([place])    a. 11/2013 L MCA stroke.  Marland Kitchen Ureteral stricture     Past Surgical History:  Procedure Laterality Date  . CATARACT EXTRACTION W/PHACO Left 12/20/2014   Procedure: CATARACT EXTRACTION PHACO AND INTRAOCULAR LENS PLACEMENT (IOC);  Surgeon: Birder Robson, MD;  Location: [facility] ORS;  Service: Ophthalmology;  Laterality: Left;   [place]     00:59.6 AP%    21.2 CDE    12.66 fluid pack lot #[id number] H  . JOINT REPLACEMENT Left 2014   knee partial replacement  . kidney stent Right 1995    There were no vitals filed for this visit.   Subjective Assessment - 04/04/20 0906    Subjective Pt reports his wlaking has  improved. Difficulty with uneven terrain. No falls since last session.    Limitations Walking    Patient Stated Goals Patient reports his goal not to fall and regain some strength in his legs.          Neuro Re-Ed:     Ambulate in hallway:  horizontal head turns 100 ft close CGA vertical head turns 100 ft close CGA Increasing/decreasing of gait speed 100 ft, CGA Dual tasking counting by multiples of 5's, 100 ft, CGA  Intermittent, variable step lengths. Most difficulty with vertical head turns with intermittent sway. No LOB noted throughout. With dual tasking pt displayed difficulty with correct counting but no deficits in gait speed, sway, or LOB.  Forward/backward walking in // bars:   Use of hedgehog balls on floor as visual cuing for backwards walking to promote increased hip extension. X6, progressed from BUE support for x2, SUE support x2, and no UE support x2. Decreased hip extension with no UE support and L lateral lean on bar for balance. CGA throughout.   Alternating walking cone taps: x4, CGA. Difficulty with stance on LLE requiring intermittent bouts of multiple attempts to tap cone with RLE. L lat sway when standing on LLE to  compensate for balance.  Lateral cone taps: x4. Tactile, visual, and verbal cues on maintaining hips, toes, and shoulders facing cones with stepping to R/L in frontal plane for improved hip stability with single leg stance when tapping cones. Required max tactile cues to improve correct hip and shoulder positioning. Similar as above, decreased stance time on LLE requiring intermittent, multiple attempts to tap cone with RLE. No LOB. CGA   Multimodal cues required for correct form/technique with balance exercises.     PT Education - 04/04/20 0851    Education Details Safety with gait. Form/technique with exercise.    Person(s) Educated Patient    Methods Explanation;Demonstration;Tactile cues;Verbal cues    Comprehension Verbalized  understanding;Returned demonstration            PT Short Term Goals - 03/23/20 1132      PT SHORT TERM GOAL #1   Title Patient will be independent in home exercise program to improve strength/mobility for better functional independence with ADLs.    Baseline 02/17/2020- Patient has no formal HEP in place. 03/23/2020- Pt reports mostly compliant with walking program and will benefit from continued balance HEP.    Time 6    Period Weeks    Status On-going    Target Date 03/30/20             PT Long Term Goals - 03/23/20 1134      PT LONG TERM GOAL #1   Title Patient will increase FOTO score to equal to or greater than 69 to demonstrate statistically significant improvement in mobility and quality of life.    Baseline 02/17/2020= 59    Time 12    Period Weeks    Status New      PT LONG TERM GOAL #2   Title Patient (> 80 years old) will complete five times sit to stand test in < 15 seconds indicating an increased LE strength and improved balance.    Baseline 02/17/2020- 22 sec with no UE support. 03/23/2020- 15.57 sec without UE support    Time 12    Period Weeks    Status New      PT LONG TERM GOAL #3   Title Pt will improve BERG from 50/56 to 53/56 or higher in order to demonstrate clinically significant improvement in balance.    Baseline 02/17/2020= 50/56    Time 12    Period Weeks    Status New      PT LONG TERM GOAL #4   Title Pt will decrease TUG to below 11 seconds/decrease in order to demonstrate decreased fall risk.    Baseline 02/17/20- 13 sec without an assistive device. 03/23/2020- TUG= 12.5 sec without UE support    Time 12    Period Weeks    Status New      PT LONG TERM GOAL #5   Title Patient will demonstrate improved overall ambulation > 800 feet on all surfaces without an assistive device without loss of balance for improved community distances.    Baseline 02/17/2020-Patient reports limited ambulation in [place] community to approx 1 block    Time 12     Period Weeks    Status New                 Plan - 04/04/20 7482    Clinical Impression Statement Per note from previous documentation, pt improved today with horizontal and vertical head turns with no posterior LOB. However pt did display variable step lengths and slowed  gait speed intermittently to maintain balance. Pt displayed most difficulty with sequencing LE's with alternating cone taps with decreased stance time on LLE requiring multiple attempts to tap cone on RLE. Pt can continue to benefit from skilled PT services to progress strength, balance, and mobility to further reduce his risk of falls.    Personal Factors and Comorbidities Age;Comorbidity 1    Comorbidities HTN    Examination-Activity Limitations Reach Overhead;Stairs;Dressing;Carry;Locomotion Level    Examination-Participation Restrictions Yard Work    Stability/Clinical Decision Making Stable/Uncomplicated    Rehab Potential Good    PT Frequency 2x / week    PT Duration 12 weeks    PT Treatment/Interventions Cryotherapy;Moist Heat;DME Instruction;Gait training;Stair training;Functional mobility training;Therapeutic activities;Therapeutic exercise;Balance training;Neuromuscular re-education;Patient/family education;Manual techniques;Passive range of motion    PT Next Visit Plan Continue with progressive LE strengthening, balance, gait training (outdoors next week- weather permitting)    PT Home Exercise Plan no changes.    Consulted and Agree with Plan of Care Patient           Patient will benefit from skilled therapeutic intervention in order to improve the following deficits and impairments:  Abnormal gait,Decreased activity tolerance,Cardiopulmonary status limiting activity,Decreased balance,Decreased endurance,Decreased mobility,Decreased strength,Difficulty walking,Impaired UE functional use  Visit Diagnosis: Muscle weakness (generalized)  Difficulty in walking, not elsewhere classified     Problem  List Patient Active Problem List   Diagnosis Date Noted  . Diffuse brain atrophy ([place]) 12/09/2019  . Balance problem 12/03/2019  . Acquired thrombophilia ([place]) 12/03/2019  . Prediabetes 05/29/2019  . Benign prostatic hyperplasia with urinary frequency 08/20/2018  . Hearing aid worn 08/16/2017  . Paronychia of finger, left 08/16/2017  . Broca's aphasia 08/16/2017  . History of cataract surgery 08/27/2016  . Age-related macular degeneration 08/27/2016  . Counseling regarding end of life decision making 08/27/2016  . Do not resuscitate status 08/27/2016  . BPH associated with nocturia 02/11/2015  . PAF (paroxysmal atrial fibrillation) ([place]) 12/14/2013  . Hyperlipidemia LDL goal <100 12/12/2013  . CVD (cerebrovascular disease) 12/10/2013  . GERD (gastroesophageal reflux disease) 07/25/2013  . Left arm numbness 07/25/2013  . Chronic constipation 01/21/2013  . Encounter for preventive health examination 01/21/2013  . Essential hypertension, benign 10/21/2012  . Glaucoma 10/21/2012  . History of colonic polyps 10/21/2012  . Ureteral stricture, right 10/21/2012    [place] Caster. Fairly IV, PT, DPT Physical Therapist- [facility]  04/04/2020, 2:24 PM  [place] MAIN [facility] SERVICES [address] [place], [place], [address] Phone: [phone]   Fax:  [phone]  Name: Todd Golden MRN: [id number] Date of Birth: 11-25-1930

## 2020-04-07 ENCOUNTER — Other Ambulatory Visit: Payer: Self-pay

## 2020-04-07 ENCOUNTER — Ambulatory Visit: Payer: PPO | Admitting: Speech Pathology

## 2020-04-07 ENCOUNTER — Ambulatory Visit: Payer: PPO

## 2020-04-07 DIAGNOSIS — R262 Difficulty in walking, not elsewhere classified: Secondary | ICD-10-CM

## 2020-04-07 DIAGNOSIS — M6281 Muscle weakness (generalized): Secondary | ICD-10-CM

## 2020-04-07 DIAGNOSIS — G319 Degenerative disease of nervous system, unspecified: Secondary | ICD-10-CM

## 2020-04-07 DIAGNOSIS — R41841 Cognitive communication deficit: Secondary | ICD-10-CM

## 2020-04-07 NOTE — Therapy (Signed)
[place] MAIN [facility] SERVICES [address] [place], [place], [address] Phone: [phone]   Fax:  [phone]  Speech Language Pathology Treatment  Patient Details  Name: Todd Golden MRN: [id number] Date of Birth: July 26, 1930 Referring Provider (SLP): Fara Olden   Encounter Date: 04/07/2020   End of Session - 04/07/20 0912    Visit Number 16    Number of Visits 36    Date for SLP Re-Evaluation 06/16/20    Authorization Type [employer]    Authorization Time Period 03/23/2020 thru 06/16/2020    Authorization - Visit Number 6    Progress Note Due on Visit 10    SLP Start Time 0800    SLP Stop Time  0900    SLP Time Calculation (min) 60 min    Activity Tolerance Patient tolerated treatment well           Past Medical History:  Diagnosis Date  . Allergy   . Arthritis   . Asthma   . Cataract   . Chronic constipation   . Colon polyps   . Cough   . GERD (gastroesophageal reflux disease)   . Glaucoma   . HOH (hard of hearing)    a. Uses hearing aids  . Hyperlipidemia   . Hypertension   . Macular degeneration   . Nephrolithiasis    stent  . PAF (paroxysmal atrial fibrillation) ([place])    a. 11/2013 Dx in setting of L MCA stroke. CHA2DS2VASc = 5-->Eliquis; b. 11/2013 Echo: Nl LV fxn. Mildly dil LA.  . Stroke ([place])    a. 11/2013 L MCA stroke.  Marland Kitchen Ureteral stricture     Past Surgical History:  Procedure Laterality Date  . CATARACT EXTRACTION W/PHACO Left 12/20/2014   Procedure: CATARACT EXTRACTION PHACO AND INTRAOCULAR LENS PLACEMENT (IOC);  Surgeon: Birder Robson, MD;  Location: [facility] ORS;  Service: Ophthalmology;  Laterality: Left;   [place]     00:59.6 AP%    21.2 CDE    12.66 fluid pack lot #[id number] H  . JOINT REPLACEMENT Left 2014   knee partial replacement  . kidney stent Right 1995    There were no vitals filed for this visit.   Subjective Assessment - 04/07/20 0910    Subjective Pt talkative,  accompanied by his wife    Patient is accompained by: Family member    Currently in Pain? No/denies          Neuro   ST   TX   NOTE          Treatment Data and Patient's Response to Treatment   Skilled treatment session focused on pt's cognitive communication abilities. Pt arrived to session very talkative and accompanied by his wife. Pt utilized a silent pause x 1 during initial conversation to better organize his thoughts. However throughout the session, his cognitive communication was more impaired than in our previous session. Pt's fluency was limited to 2-3 words. He was aware and stated, "Sometimes it is worse than other times. It is so much worse when I am around other people and when I am talking on the phone. Why is that?" Remainder of session focused on describing the increased cognitive load that is present with conversing socially and over the phone. While pt voiced understanding, he would benefit from further education d/t pt's deficits in memory.   Skilled ST intervention continues to be appropriate to instruct and practice cognitive communication compensatory strategies with pt and his wife.  SLP Education - 04/07/20 0912    Education Details cognitive communication deficits    Person(s) Educated Patient;Spouse    Methods Explanation;Demonstration;Verbal cues;Handout    Comprehension Verbalized understanding;Need further instruction            SLP Short Term Goals - 03/24/20 1029      SLP SHORT TERM GOAL #1   Title With minimal cues, pt will read list of 10 functional phrases at slow rate to reduce dysfluencies to < 25%.    Baseline revised see new STG 5    Status Revised      SLP SHORT TERM GOAL #2   Title Given maximal support, pt will demonstrate emergent awareness of dysfluencies by self-monitoring and self-correcting dysfluencies in 5 out of 10 opportunities.    Status Deferred      SLP SHORT TERM GOAL #3   Title With minimal to moderate  assistance, pt will generate 6 items in a basic category in 7 out of 10 opportunities.    Baseline Revises see STG 7      SLP SHORT TERM GOAL #5   Title With minimal cues, pt will read list of 5 functional phrases specific to pt using a slow rate to reduce dysfluencies to < 50%.    Baseline maximal cues for slow rate    Time 10    Period --   sessions   Status New      Additional Short Term Goals   Additional Short Term Goals Yes      SLP SHORT TERM GOAL #6   Title With minimal assistance, pt will generate 6 items in functional/familiar category in 7 out of 10 opportunities.    Baseline moderate to maximal assistance    Time 10    Period --   sessions   Status New            SLP Long Term Goals - 03/24/20 1040      SLP LONG TERM GOAL #1   Title Pt will utilize speech intelligibility strategies to achieve > 50% intelligibility at the simple sentences level in 5 of 10 opportunities.    Baseline revised from previous goal    Status Revised    Target Date 06/16/20      SLP LONG TERM GOAL #2   Title Pt will utilize word finding strategies to convey basic wants and needs in 50% of opportunities.    Baseline revised    Time 12    Period Weeks    Status Revised    Target Date 06/16/20            Plan - 04/07/20 0912    Speech Therapy Frequency 2x / week    Duration 12 weeks    Treatment/Interventions Compensatory techniques;SLP instruction and feedback;Patient/family education;Language facilitation;Functional tasks;Internal/external aids;Cueing hierarchy    Potential to Achieve Goals Fair    Potential Considerations Co-morbidities;Medical prognosis;Severity of impairments    SLP Home Exercise Plan provided, see instruction section    Consulted and Agree with Plan of Care Patient;Family member/caregiver    Family Member Consulted pt's wife           Patient will benefit from skilled therapeutic intervention in order to improve the following deficits and impairments:    Cognitive communication deficit  Diffuse brain atrophy [facility])    Problem List Patient Active Problem List   Diagnosis Date Noted  . Diffuse brain atrophy ([place]) 12/09/2019  . Balance problem 12/03/2019  . Acquired thrombophilia ([place]) 12/03/2019  .  Prediabetes 05/29/2019  . Benign prostatic hyperplasia with urinary frequency 08/20/2018  . Hearing aid worn 08/16/2017  . Paronychia of finger, left 08/16/2017  . Broca's aphasia 08/16/2017  . History of cataract surgery 08/27/2016  . Age-related macular degeneration 08/27/2016  . Counseling regarding end of life decision making 08/27/2016  . Do not resuscitate status 08/27/2016  . BPH associated with nocturia 02/11/2015  . PAF (paroxysmal atrial fibrillation) ([place]) 12/14/2013  . Hyperlipidemia LDL goal <100 12/12/2013  . CVD (cerebrovascular disease) 12/10/2013  . GERD (gastroesophageal reflux disease) 07/25/2013  . Left arm numbness 07/25/2013  . Chronic constipation 01/21/2013  . Encounter for preventive health examination 01/21/2013  . Essential hypertension, benign 10/21/2012  . Glaucoma 10/21/2012  . History of colonic polyps 10/21/2012  . Ureteral stricture, right 10/21/2012   Pattricia Weiher B. Rutherford Nail M.S., CCC-SLP, [place] Pathologist Rehabilitation Services Office [phone]  Stormy Fabian 04/07/2020, 9:13 AM  [place] MAIN [facility] SERVICES [address] [place], [place], [address] Phone: [phone]   Fax:  [phone]   Name: ICARUS PARTCH MRN: [id number] Date of Birth: 08-14-1930

## 2020-04-07 NOTE — Therapy (Signed)
[place] MAIN [facility] SERVICES [address] [place], [place], [address] Phone: [phone]   Fax:  [phone]  Physical Therapy Treatment  Patient Details  Name: Todd Golden MRN: [id number] Date of Birth: 1930-09-09 Referring Provider (PT): Dr. Deborra Medina   Encounter Date: 04/07/2020   PT End of Session - 04/07/20 0907    Visit Number 13    Number of Visits 25    Date for PT Re-Evaluation 05/11/20    Authorization Type 02/17/2020- Initial PT evaluation    PT Start Time 0900    PT Stop Time 0945    PT Time Calculation (min) 45 min    Equipment Utilized During Treatment Gait belt    Activity Tolerance Patient tolerated treatment well    Behavior During Therapy [facility] for tasks assessed/performed           Past Medical History:  Diagnosis Date  . Allergy   . Arthritis   . Asthma   . Cataract   . Chronic constipation   . Colon polyps   . Cough   . GERD (gastroesophageal reflux disease)   . Glaucoma   . HOH (hard of hearing)    a. Uses hearing aids  . Hyperlipidemia   . Hypertension   . Macular degeneration   . Nephrolithiasis    stent  . PAF (paroxysmal atrial fibrillation) ([place])    a. 11/2013 Dx in setting of L MCA stroke. CHA2DS2VASc = 5-->Eliquis; b. 11/2013 Echo: Nl LV fxn. Mildly dil LA.  . Stroke ([place])    a. 11/2013 L MCA stroke.  Marland Kitchen Ureteral stricture     Past Surgical History:  Procedure Laterality Date  . CATARACT EXTRACTION W/PHACO Left 12/20/2014   Procedure: CATARACT EXTRACTION PHACO AND INTRAOCULAR LENS PLACEMENT (IOC);  Surgeon: Birder Robson, MD;  Location: [facility] ORS;  Service: Ophthalmology;  Laterality: Left;   [place]     00:59.6 AP%    21.2 CDE    12.66 fluid pack lot #[id number] H  . JOINT REPLACEMENT Left 2014   knee partial replacement  . kidney stent Right 1995    There were no vitals filed for this visit.   Subjective Assessment - 04/07/20 0906    Subjective Pt reports he went walking and even  mowed his yard with an electric self propelled mower.    Patient is accompained by: Family member    Limitations Walking    Patient Stated Goals Patient reports his goal not to fall and regain some strength in his legs.    Currently in Pain? No/denies             Neuro Re-Ed:     Ambulation in hallway:  horizontal head turns 200 ft  CGA (patient with mild unsteadines- very talkative - able to multitask but occasional cues to remind to continue with head turns)  vertical head turns 200 ft (more unsteady w more sway- requiring more VC and  CGA)  Dual tasking counting by Patient reporting some scores of basketball games last night. Intermittent, variable step lengths-mostly short reciprocal steps with  continued difficulty with vertical head turns with intermittent sway. No LOB noted throughout.   Forward/backward walking in // bars: length of bars and back x 5 trials- decreased hip ext requiring more steps backward- VC to take a longer step and patient able to perform better requiring approx 7 steps back vs. 12 initially.  Resistive gait using matrix cable system- 12.5 - backward x 10 with verbal  cues for increased step length. Patient able to improve with VC and practice and no loss of balance or significant unsteadiness.   hedgehog disc on floor as visual cuing for gait sequencing (position at heel strike point and on at toe off point) Decreased hip extension (left weaker than right)  with no UE support x 20 reps each leg  Alternating LE  3 cone taps with 5Lb ankle weights  x5  sets, CGA. Initially wth B UE support, then 5 sets  with 1 UE, then 5 sets  without UE suuport. Difficulty with stance on LLE requiring CGA and intermittent unsteadiness- occasional reach for UE support.   Sit to stand from chair without UE support x 10 reps (no cues today for proper technique and patient able to perform on his own.   Seated LAQ BLE 5lb ankle weight x 10 reps   Multimodal cues required for  correct form/technique with balance exercises.        Clinical Impression: Patient continues to present with some difficulty with dual tasking with walking and head turns/nods - at risk for falling as well as shuffling with backward gait- able to improve his steps today with VC and practice. He did demonstrate independence with sit to stand 10 reps from chair without UE support or cues with good technique.  Patient will continue to benefit from skilled PT interventions to continue to progress his strength, balance and mobility to reduce risk of falling and restore indpendent mobility                  PT Education - 04/07/20 1005    Education Details balance and exercises form for best technique.    Person(s) Educated Patient    Methods Explanation;Demonstration;Tactile cues;Verbal cues    Comprehension Verbalized understanding;Tactile cues required;Returned demonstration;Need further instruction;Verbal cues required            PT Short Term Goals - 03/23/20 1132      PT SHORT TERM GOAL #1   Title Patient will be independent in home exercise program to improve strength/mobility for better functional independence with ADLs.    Baseline 02/17/2020- Patient has no formal HEP in place. 03/23/2020- Pt reports mostly compliant with walking program and will benefit from continued balance HEP.    Time 6    Period Weeks    Status On-going    Target Date 03/30/20             PT Long Term Goals - 03/23/20 1134      PT LONG TERM GOAL #1   Title Patient will increase FOTO score to equal to or greater than 69 to demonstrate statistically significant improvement in mobility and quality of life.    Baseline 02/17/2020= 59    Time 12    Period Weeks    Status New      PT LONG TERM GOAL #2   Title Patient (> 54 years old) will complete five times sit to stand test in < 15 seconds indicating an increased LE strength and improved balance.    Baseline 02/17/2020- 22 sec with no UE  support. 03/23/2020- 15.57 sec without UE support    Time 12    Period Weeks    Status New      PT LONG TERM GOAL #3   Title Pt will improve BERG from 50/56 to 53/56 or higher in order to demonstrate clinically significant improvement in balance.    Baseline 02/17/2020= 50/56    Time  12    Period Weeks    Status New      PT LONG TERM GOAL #4   Title Pt will decrease TUG to below 11 seconds/decrease in order to demonstrate decreased fall risk.    Baseline 02/17/20- 13 sec without an assistive device. 03/23/2020- TUG= 12.5 sec without UE support    Time 12    Period Weeks    Status New      PT LONG TERM GOAL #5   Title Patient will demonstrate improved overall ambulation > 800 feet on all surfaces without an assistive device without loss of balance for improved community distances.    Baseline 02/17/2020-Patient reports limited ambulation in [place] to approx 1 block    Time 12    Period Weeks    Status New                 Plan - 04/07/20 0909    Clinical Impression Statement Patient continues to present with some difficulty with dual tasking with walking and head turns/nods - at risk for falling as well as shuffling with backward gait- able to improve his steps today with VC and practice. He did demonstrate independence with sit to stand 10 reps from chair without UE support or cues with good technique.  Patient will continue to benefit from skilled PT interventions to continue to progress his strength, balance and mobility to reduce risk of falling and restore indpendent mobility    Personal Factors and Comorbidities Age;Comorbidity 1    Comorbidities HTN    Examination-Activity Limitations Reach Overhead;Stairs;Dressing;Carry;Locomotion Level    Examination-Participation Restrictions Yard Work    Stability/Clinical Decision Making Stable/Uncomplicated    Rehab Potential Good    PT Frequency 2x / week    PT Duration 12 weeks    PT Treatment/Interventions  Cryotherapy;Moist Heat;DME Instruction;Gait training;Stair training;Functional mobility training;Therapeutic activities;Therapeutic exercise;Balance training;Neuromuscular re-education;Patient/family education;Manual techniques;Passive range of motion    PT Next Visit Plan Continue with progressive LE strengthening, balance, gait training (outdoors next week- weather permitting)    PT Home Exercise Plan no changes.    Consulted and Agree with Plan of Care Patient           Patient will benefit from skilled therapeutic intervention in order to improve the following deficits and impairments:  Abnormal gait,Decreased activity tolerance,Cardiopulmonary status limiting activity,Decreased balance,Decreased endurance,Decreased mobility,Decreased strength,Difficulty walking,Impaired UE functional use  Visit Diagnosis: Difficulty in walking, not elsewhere classified  Muscle weakness (generalized)     Problem List Patient Active Problem List   Diagnosis Date Noted  . Diffuse brain atrophy ([place]) 12/09/2019  . Balance problem 12/03/2019  . Acquired thrombophilia ([place]) 12/03/2019  . Prediabetes 05/29/2019  . Benign prostatic hyperplasia with urinary frequency 08/20/2018  . Hearing aid worn 08/16/2017  . Paronychia of finger, left 08/16/2017  . Broca's aphasia 08/16/2017  . History of cataract surgery 08/27/2016  . Age-related macular degeneration 08/27/2016  . Counseling regarding end of life decision making 08/27/2016  . Do not resuscitate status 08/27/2016  . BPH associated with nocturia 02/11/2015  . PAF (paroxysmal atrial fibrillation) ([place]) 12/14/2013  . Hyperlipidemia LDL goal <100 12/12/2013  . CVD (cerebrovascular disease) 12/10/2013  . GERD (gastroesophageal reflux disease) 07/25/2013  . Left arm numbness 07/25/2013  . Chronic constipation 01/21/2013  . Encounter for preventive health examination 01/21/2013  . Essential hypertension, benign 10/21/2012  . Glaucoma 10/21/2012  .  History of colonic polyps 10/21/2012  . Ureteral stricture, right 10/21/2012  Lewis Moccasin, PT 04/07/2020, 10:06 AM  [place] MAIN [facility] SERVICES [address] [place], [place], [address] Phone: [phone]   Fax:  [phone]  Name: Todd Golden MRN: [id number] Date of Birth: 1930/02/14

## 2020-04-07 NOTE — Patient Instructions (Signed)
Utilize compensatory strategies at home specifically, slow rate, silence pause to organize thoughts

## 2020-04-11 ENCOUNTER — Other Ambulatory Visit: Payer: Self-pay

## 2020-04-11 ENCOUNTER — Ambulatory Visit: Payer: PPO

## 2020-04-11 ENCOUNTER — Encounter: Payer: Self-pay | Admitting: Physical Therapy

## 2020-04-11 ENCOUNTER — Ambulatory Visit: Payer: PPO | Admitting: Speech Pathology

## 2020-04-11 DIAGNOSIS — M6281 Muscle weakness (generalized): Secondary | ICD-10-CM | POA: Diagnosis not present

## 2020-04-11 DIAGNOSIS — R41841 Cognitive communication deficit: Secondary | ICD-10-CM

## 2020-04-11 DIAGNOSIS — R262 Difficulty in walking, not elsewhere classified: Secondary | ICD-10-CM

## 2020-04-11 DIAGNOSIS — J3089 Other allergic rhinitis: Secondary | ICD-10-CM | POA: Diagnosis not present

## 2020-04-11 DIAGNOSIS — G319 Degenerative disease of nervous system, unspecified: Secondary | ICD-10-CM

## 2020-04-11 DIAGNOSIS — J301 Allergic rhinitis due to pollen: Secondary | ICD-10-CM | POA: Diagnosis not present

## 2020-04-11 NOTE — Therapy (Signed)
[place] MAIN [facility] SERVICES [address] [place], [place], [address] Phone: [phone]   Fax:  [phone]  Physical Therapy Treatment  Patient Details  Name: Todd Golden MRN: [id number] Date of Birth: 29-Oct-1930 Referring Provider (PT): Dr. Deborra Medina   Encounter Date: 04/11/2020   PT End of Session - 04/11/20 0952    Visit Number 14    Number of Visits 25    Date for PT Re-Evaluation 05/11/20    Authorization Type 02/17/2020- Initial PT evaluation    PT Start Time 0901    PT Stop Time 0941    PT Time Calculation (min) 40 min    Equipment Utilized During Treatment Gait belt    Activity Tolerance Patient tolerated treatment well    Behavior During Therapy [facility] for tasks assessed/performed           Past Medical History:  Diagnosis Date  . Allergy   . Arthritis   . Asthma   . Cataract   . Chronic constipation   . Colon polyps   . Cough   . GERD (gastroesophageal reflux disease)   . Glaucoma   . HOH (hard of hearing)    a. Uses hearing aids  . Hyperlipidemia   . Hypertension   . Macular degeneration   . Nephrolithiasis    stent  . PAF (paroxysmal atrial fibrillation) ([place])    a. 11/2013 Dx in setting of L MCA stroke. CHA2DS2VASc = 5-->Eliquis; b. 11/2013 Echo: Nl LV fxn. Mildly dil LA.  . Stroke ([place])    a. 11/2013 L MCA stroke.  Marland Kitchen Ureteral stricture     Past Surgical History:  Procedure Laterality Date  . CATARACT EXTRACTION W/PHACO Left 12/20/2014   Procedure: CATARACT EXTRACTION PHACO AND INTRAOCULAR LENS PLACEMENT (IOC);  Surgeon: Birder Robson, MD;  Location: [facility] ORS;  Service: Ophthalmology;  Laterality: Left;   [place]     00:59.6 AP%    21.2 CDE    12.66 fluid pack lot #[id number]  . JOINT REPLACEMENT Left 2014   knee partial replacement  . kidney stent Right 1995    There were no vitals filed for this visit.   Subjective Assessment - 04/11/20 0950    Subjective Pt reports mowing his yard yet  again. Has family in town and has been active.    Patient is accompained by: Family member    Limitations Walking    Patient Stated Goals Patient reports his goal not to fall and regain some strength in his legs.    Currently in Pain? No/denies          Neuro Re-Ed:   Along 100' hallway (CGA throughout)  Dual tasking with vertical head turns: x4 with holding conversation. Variable step lengths and decreased speed with transitioning into looking down.  Backwards walking for balance challenge and promote improved hip extension: x4. Max Verbal cues required with poor to fair carryover.   Resisted walking forwards/backwards for added perturbation for balance challenge: 12.5 lbs: x5/direction. Max verbal cues for heel strike and hip extension during those appropriate stages of gait in both directions with fair carryover.   Hip extension with hedge hog as visual cue to improve extension, 2x10, good form/technique and improved hip extension with visual cue.   Ended session with ambulating 140'x2 trials with focus on increased step lengths, heel strike, and toe off. Good carryover with walking with slight unsteadiness with sway that pt could correct independently. CGA provided throughout.    Pt  required mod to max multimodal cuing and demo with intermittent redirection throughout session to improve correct completion of exercise.    PT Education - 04/11/20 0951    Education Details Balance and strength exercise form/technique. Gait mechanics.    Person(s) Educated Patient    Methods Explanation;Demonstration;Tactile cues;Verbal cues    Comprehension Verbalized understanding;Returned demonstration;Verbal cues required;Tactile cues required;Need further instruction            PT Short Term Goals - 03/23/20 1132      PT SHORT TERM GOAL #1   Title Patient will be independent in home exercise program to improve strength/mobility for better functional independence with ADLs.    Baseline  02/17/2020- Patient has no formal HEP in place. 03/23/2020- Pt reports mostly compliant with walking program and will benefit from continued balance HEP.    Time 6    Period Weeks    Status On-going    Target Date 03/30/20             PT Long Term Goals - 03/23/20 1134      PT LONG TERM GOAL #1   Title Patient will increase FOTO score to equal to or greater than 69 to demonstrate statistically significant improvement in mobility and quality of life.    Baseline 02/17/2020= 59    Time 12    Period Weeks    Status New      PT LONG TERM GOAL #2   Title Patient (> 27 years old) will complete five times sit to stand test in < 15 seconds indicating an increased LE strength and improved balance.    Baseline 02/17/2020- 22 sec with no UE support. 03/23/2020- 15.57 sec without UE support    Time 12    Period Weeks    Status New      PT LONG TERM GOAL #3   Title Pt will improve BERG from 50/56 to 53/56 or higher in order to demonstrate clinically significant improvement in balance.    Baseline 02/17/2020= 50/56    Time 12    Period Weeks    Status New      PT LONG TERM GOAL #4   Title Pt will decrease TUG to below 11 seconds/decrease in order to demonstrate decreased fall risk.    Baseline 02/17/20- 13 sec without an assistive device. 03/23/2020- TUG= 12.5 sec without UE support    Time 12    Period Weeks    Status New      PT LONG TERM GOAL #5   Title Patient will demonstrate improved overall ambulation > 800 feet on all surfaces without an assistive device without loss of balance for improved community distances.    Baseline 02/17/2020-Patient reports limited ambulation in [place] to approx 1 block    Time [place] - 04/11/20 5277    Clinical Impression Statement Continued difficulty with dual tasking with walking mechanics and head turns. With ambulation and conversation pt takes variable step lengths and maintains  narrow BOS. Added challenge of vertical head turns pt has a transition of decreased gait speed along with variable step lengths. Rest of session with focus on improving heel strike for initial contact and hip extension for improved toe off. Pt can continue to benefit from skilled PT services to further reduce risk of falls and improve independent mobility.  Personal Factors and Comorbidities Age;Comorbidity 1    Comorbidities HTN    Examination-Activity Limitations Reach Overhead;Stairs;Dressing;Carry;Locomotion Level    Examination-Participation Restrictions Yard Work    Stability/Clinical Decision Making Stable/Uncomplicated    Rehab Potential Good    PT Frequency 2x / week    PT Duration 12 weeks    PT Treatment/Interventions Cryotherapy;Moist Heat;DME Instruction;Gait training;Stair training;Functional mobility training;Therapeutic activities;Therapeutic exercise;Balance training;Neuromuscular re-education;Patient/family education;Manual techniques;Passive range of motion    PT Next Visit Plan Continue with progressive LE strengthening, balance, gait training (outdoors next week- weather permitting)    PT Home Exercise Plan no changes.    Consulted and Agree with Plan of Care Patient           Patient will benefit from skilled therapeutic intervention in order to improve the following deficits and impairments:  Abnormal gait,Decreased activity tolerance,Cardiopulmonary status limiting activity,Decreased balance,Decreased endurance,Decreased mobility,Decreased strength,Difficulty walking,Impaired UE functional use  Visit Diagnosis: Difficulty in walking, not elsewhere classified  Muscle weakness (generalized)     Problem List Patient Active Problem List   Diagnosis Date Noted  . Diffuse brain atrophy ([place]) 12/09/2019  . Balance problem 12/03/2019  . Acquired thrombophilia ([place]) 12/03/2019  . Prediabetes 05/29/2019  . Benign prostatic hyperplasia with urinary frequency  08/20/2018  . Hearing aid worn 08/16/2017  . Paronychia of finger, left 08/16/2017  . Broca's aphasia 08/16/2017  . History of cataract surgery 08/27/2016  . Age-related macular degeneration 08/27/2016  . Counseling regarding end of life decision making 08/27/2016  . Do not resuscitate status 08/27/2016  . BPH associated with nocturia 02/11/2015  . PAF (paroxysmal atrial fibrillation) ([place]) 12/14/2013  . Hyperlipidemia LDL goal <100 12/12/2013  . CVD (cerebrovascular disease) 12/10/2013  . GERD (gastroesophageal reflux disease) 07/25/2013  . Left arm numbness 07/25/2013  . Chronic constipation 01/21/2013  . Encounter for preventive health examination 01/21/2013  . Essential hypertension, benign 10/21/2012  . Glaucoma 10/21/2012  . History of colonic polyps 10/21/2012  . Ureteral stricture, right 10/21/2012    Salem Caster. Fairly IV, PT, DPT Physical Therapist- [facility]  04/11/2020, 10:54 AM  [place] MAIN [facility] SERVICES [address] [place], [place], [address] Phone: [phone]   Fax:  [phone]  Name: Todd Golden MRN: [id number] Date of Birth: 28-Mar-1930

## 2020-04-12 NOTE — Therapy (Signed)
[place] MAIN [facility] SERVICES [address] [place], [place], [address] Phone: [phone]   Fax:  [phone]  Speech Language Pathology Treatment  Patient Details  Name: Todd Golden MRN: [id number] Date of Birth: May 25, 1930 Referring Provider (SLP): Fara Olden   Encounter Date: 04/11/2020   End of Session - 04/12/20 0813    Visit Number 17    Number of Visits 36    Date for SLP Re-Evaluation 06/16/20    Authorization Type [employer]    Authorization Time Period 03/23/2020 thru 06/16/2020    Authorization - Visit Number 7    Progress Note Due on Visit 10    SLP Start Time 0800    SLP Stop Time  0900    SLP Time Calculation (min) 60 min    Activity Tolerance Patient tolerated treatment well           Past Medical History:  Diagnosis Date  . Allergy   . Arthritis   . Asthma   . Cataract   . Chronic constipation   . Colon polyps   . Cough   . GERD (gastroesophageal reflux disease)   . Glaucoma   . HOH (hard of hearing)    a. Uses hearing aids  . Hyperlipidemia   . Hypertension   . Macular degeneration   . Nephrolithiasis    stent  . PAF (paroxysmal atrial fibrillation) ([place])    a. 11/2013 Dx in setting of L MCA stroke. CHA2DS2VASc = 5-->Eliquis; b. 11/2013 Echo: Nl LV fxn. Mildly dil LA.  . Stroke ([place])    a. 11/2013 L MCA stroke.  Marland Kitchen Ureteral stricture     Past Surgical History:  Procedure Laterality Date  . CATARACT EXTRACTION W/PHACO Left 12/20/2014   Procedure: CATARACT EXTRACTION PHACO AND INTRAOCULAR LENS PLACEMENT (IOC);  Surgeon: Birder Robson, MD;  Location: [facility] ORS;  Service: Ophthalmology;  Laterality: Left;   [place]     00:59.6 AP%    21.2 CDE    12.66 fluid pack lot #[id number] H  . JOINT REPLACEMENT Left 2014   knee partial replacement  . kidney stent Right 1995    There were no vitals filed for this visit.   Subjective Assessment - 04/11/20 0816    Subjective Pt with improved fluency  and as such improved speech intelligibility    Currently in Pain? No/denies            Neuro   ST   TX   NOTE          Treatment Data and Patient's Response to Treatment   Skilled treatment session targeted pt's cognitive communication deficits. SLP facilitated session by providing multiple conversation starters and moderate cues for slow rate and pause during severe dysfluencies to break motor pattern. Pt is very receptive of cues and he is able to carry these strategies over for ~ 4 sentences before requiring additional cues.   Pt's speech continues to be incrementally improved as has his knowledge of compensatory strategies. However his speech continues to be ~ 50-75% intelligible in conversation as pt has not habituated use of compensatory strategies into conversational speech. Skilled ST intervention continues to be indicated to improve pt's ability to communicate wants and needs.         SLP Education - 04/12/20 0810    Education Details slow rate and it's impact on speech intelligibility    Person(s) Educated Patient    Methods Explanation;Demonstration    Comprehension Verbalized understanding;Need further instruction  SLP Short Term Goals - 03/24/20 1029      SLP SHORT TERM GOAL #1   Title With minimal cues, pt will read list of 10 functional phrases at slow rate to reduce dysfluencies to < 25%.    Baseline revised see new STG 5    Status Revised      SLP SHORT TERM GOAL #2   Title Given maximal support, pt will demonstrate emergent awareness of dysfluencies by self-monitoring and self-correcting dysfluencies in 5 out of 10 opportunities.    Status Deferred      SLP SHORT TERM GOAL #3   Title With minimal to moderate assistance, pt will generate 6 items in a basic category in 7 out of 10 opportunities.    Baseline Revises see STG 7      SLP SHORT TERM GOAL #5   Title With minimal cues, pt will read list of 5 functional phrases specific to pt using a  slow rate to reduce dysfluencies to < 50%.    Baseline maximal cues for slow rate    Time 10    Period --   sessions   Status New      Additional Short Term Goals   Additional Short Term Goals Yes      SLP SHORT TERM GOAL #6   Title With minimal assistance, pt will generate 6 items in functional/familiar category in 7 out of 10 opportunities.    Baseline moderate to maximal assistance    Time 10    Period --   sessions   Status New            SLP Long Term Goals - 03/24/20 1040      SLP LONG TERM GOAL #1   Title Pt will utilize speech intelligibility strategies to achieve > 50% intelligibility at the simple sentences level in 5 of 10 opportunities.    Baseline revised from previous goal    Status Revised    Target Date 06/16/20      SLP LONG TERM GOAL #2   Title Pt will utilize word finding strategies to convey basic wants and needs in 50% of opportunities.    Baseline revised    Time 12    Period Weeks    Status Revised    Target Date 06/16/20            Plan - 04/12/20 0813    Speech Therapy Frequency 1x /week    Duration 12 weeks    Treatment/Interventions Compensatory techniques;SLP instruction and feedback;Patient/family education;Language facilitation;Functional tasks;Internal/external aids;Cueing hierarchy    Potential to Achieve Goals Fair    Potential Considerations Co-morbidities;Medical prognosis;Severity of impairments    SLP Home Exercise Plan provided, see instruction section    Consulted and Agree with Plan of Care Patient           Patient will benefit from skilled therapeutic intervention in order to improve the following deficits and impairments:   Cognitive communication deficit  Diffuse brain atrophy [facility])    Problem List Patient Active Problem List   Diagnosis Date Noted  . Diffuse brain atrophy ([place]) 12/09/2019  . Balance problem 12/03/2019  . Acquired thrombophilia ([place]) 12/03/2019  . Prediabetes 05/29/2019  . Benign prostatic  hyperplasia with urinary frequency 08/20/2018  . Hearing aid worn 08/16/2017  . Paronychia of finger, left 08/16/2017  . Broca's aphasia 08/16/2017  . History of cataract surgery 08/27/2016  . Age-related macular degeneration 08/27/2016  . Counseling regarding end of life decision making 08/27/2016  .  Do not resuscitate status 08/27/2016  . BPH associated with nocturia 02/11/2015  . PAF (paroxysmal atrial fibrillation) ([place]) 12/14/2013  . Hyperlipidemia LDL goal <100 12/12/2013  . CVD (cerebrovascular disease) 12/10/2013  . GERD (gastroesophageal reflux disease) 07/25/2013  . Left arm numbness 07/25/2013  . Chronic constipation 01/21/2013  . Encounter for preventive health examination 01/21/2013  . Essential hypertension, benign 10/21/2012  . Glaucoma 10/21/2012  . History of colonic polyps 10/21/2012  . Ureteral stricture, right 10/21/2012   Kennadie Brenner B. Rutherford Nail M.S., CCC-SLP, [facility] Speech-Language Pathologist Rehabilitation Services Office [phone]  Stormy Fabian 04/12/2020, [place] MAIN [facility] SERVICES [address] [place], [place], [address] Phone: [phone]   Fax:  [phone]   Name: Todd Golden MRN: [id number] Date of Birth: 03/28/1930

## 2020-04-12 NOTE — Patient Instructions (Signed)
Utilize slow rate and pausing to formulate thoughts

## 2020-04-14 ENCOUNTER — Ambulatory Visit: Payer: PPO

## 2020-04-14 ENCOUNTER — Ambulatory Visit: Payer: PPO | Admitting: Speech Pathology

## 2020-04-18 ENCOUNTER — Other Ambulatory Visit: Payer: Self-pay

## 2020-04-18 ENCOUNTER — Ambulatory Visit: Payer: PPO

## 2020-04-18 ENCOUNTER — Ambulatory Visit: Payer: PPO | Admitting: Speech Pathology

## 2020-04-18 DIAGNOSIS — G319 Degenerative disease of nervous system, unspecified: Secondary | ICD-10-CM

## 2020-04-18 DIAGNOSIS — J301 Allergic rhinitis due to pollen: Secondary | ICD-10-CM | POA: Diagnosis not present

## 2020-04-18 DIAGNOSIS — M6281 Muscle weakness (generalized): Secondary | ICD-10-CM | POA: Diagnosis not present

## 2020-04-18 DIAGNOSIS — R41841 Cognitive communication deficit: Secondary | ICD-10-CM

## 2020-04-18 DIAGNOSIS — R262 Difficulty in walking, not elsewhere classified: Secondary | ICD-10-CM

## 2020-04-18 DIAGNOSIS — J3089 Other allergic rhinitis: Secondary | ICD-10-CM | POA: Diagnosis not present

## 2020-04-18 NOTE — Patient Instructions (Signed)
When he becomes overly frustrated with expressive abilities, take a moment to pause, collect himself, organize is thoughts and start communication again  Implement provided strategies to help with phone conversations

## 2020-04-18 NOTE — Therapy (Signed)
[place] MAIN [facility] SERVICES [address] [place], [place], [address] Phone: [phone]   Fax:  [phone]  Physical Therapy Treatment  Patient Details  Name: Todd Golden MRN: [id number] Date of Birth: [birth year] Referring Provider (PT): Dr. Deborra Medina   Encounter Date: 04/18/2020   PT End of Session - 04/18/20 0928    Visit Number 15    Number of Visits 25    Date for PT Re-Evaluation 05/11/20    Authorization Type 02/17/2020- Initial PT evaluation    PT Start Time 0905    PT Stop Time 0940    PT Time Calculation (min) 35 min    Equipment Utilized During Treatment Gait belt    Activity Tolerance Patient limited by pain    Behavior During Therapy [facility] for tasks assessed/performed           Past Medical History:  Diagnosis Date  . Allergy   . Arthritis   . Asthma   . Cataract   . Chronic constipation   . Colon polyps   . Cough   . GERD (gastroesophageal reflux disease)   . Glaucoma   . HOH (hard of hearing)    a. Uses hearing aids  . Hyperlipidemia   . Hypertension   . Macular degeneration   . Nephrolithiasis    stent  . PAF (paroxysmal atrial fibrillation) ([place])    a. 11/2013 Dx in setting of L MCA stroke. CHA2DS2VASc = 5-->Eliquis; b. 11/2013 Echo: Nl LV fxn. Mildly dil LA.  . Stroke ([place])    a. 11/2013 L MCA stroke.  Marland Kitchen Ureteral stricture     Past Surgical History:  Procedure Laterality Date  . CATARACT EXTRACTION W/PHACO Left 12/20/2014   Procedure: CATARACT EXTRACTION PHACO AND INTRAOCULAR LENS PLACEMENT (IOC);  Surgeon: Birder Robson, MD;  Location: [facility] ORS;  Service: Ophthalmology;  Laterality: Left;   [place]     00:59.6 AP%    21.2 CDE    12.66 fluid pack lot #[id number] H  . JOINT REPLACEMENT Left 2014   knee partial replacement  . kidney stent Right 1995    There were no vitals filed for this visit.   Subjective Assessment - 04/18/20 0909    Subjective Pt reports he has injured his Knee after  mowing his yard. Rates pain at 5/10 and states it is "somewhat improved but don't think I could do those balance exercises today."    Patient is accompained by: Family member    Limitations Walking    Patient Stated Goals Patient reports his goal not to fall and regain some strength in his legs.    Currently in Pain? Yes    Pain Score 5     Pain Location Knee    Pain Orientation Left    Pain Descriptors / Indicators Aching    Pain Type Acute pain    Pain Onset In the past 7 days    Pain Frequency Intermittent    Aggravating Factors  standing, transfers, walking    Pain Relieving Factors rest, Ice    Effect of Pain on Daily Activities Difficulty walking    Multiple Pain Sites No              Treatment limited today- Patient reports injuring his left knee when mowing. All balance and progressive strengthening was deferred.   Assessed Left knee today: Structurally No swelling, redness, or bruising and patient denied any falls. No tenderness to light touch- Some medial/anterior knee pain with  deep pressure.  MMT= 4/5 with pain with knee ext/flex Patient presents with 85 deg AROM Left Knee Flex and 0 deg knee ext And right AROM= 125 AROM. Patient has history of partial left knee replacement 12 years ago.  Performed gentle PROM and grade 2 PA/AP tibiofemoral mobs/glides 30 bouts in varying deg of flex  Post PROM testing- Left Knee AROM= 124 deg Cryotherapy- Cold pack to left knee post treatment x 10 min.  Patient reported 0/10 left knee post treatment.  Clinical Impression: Treatment limited today secondary to complaining of left knee pain. Assessed left knee with patient  Presents with good ROM and improved post treatment. Patient responded well to ROM activities and ice and reported no pain after session. Patient will continue to benefit from skilled PT interventions to continue to progress his strength, balance and mobility to reduce risk of falling and restore indpendent  mobility                       PT Education - 04/18/20 0927    Education Details Reviewed importance of pain relief/management including taking meds as prescribed and ice to assist with pain/inflammation.    Person(s) Educated Patient    Methods Explanation;Demonstration;Verbal cues    Comprehension Verbalized understanding;Returned demonstration;Verbal cues required            PT Short Term Goals - 03/23/20 1132      PT SHORT TERM GOAL #1   Title Patient will be independent in home exercise program to improve strength/mobility for better functional independence with ADLs.    Baseline 85/27/2022- Patient has no formal HEP in place. 85/03/2020- Pt reports mostly compliant with walking program and will benefit from continued balance HEP.    Time 6    Period Weeks    Status On-going    Target Date 03/30/20             PT Long Term Goals - 03/23/20 1134      PT LONG TERM GOAL #1   Title Patient will increase FOTO score to equal to or greater than 69 to demonstrate statistically significant improvement in mobility and quality of life.    Baseline 85/27/2022= 59    Time 12    Period Weeks    Status New      PT LONG TERM GOAL #2   Title Patient (> 85 years old) will complete five times sit to stand test in < 15 seconds indicating an increased LE strength and improved balance.    Baseline 85/27/2022- 22 sec with no UE support. 85/03/2020- 15.57 sec without UE support    Time 12    Period Weeks    Status New      PT LONG TERM GOAL #3   Title Pt will improve BERG from 50/56 to 53/56 or higher in order to demonstrate clinically significant improvement in balance.    Baseline 85/27/2022= 50/56    Time 12    Period Weeks    Status New      PT LONG TERM GOAL #4   Title Pt will decrease TUG to below 11 seconds/decrease in order to demonstrate decreased fall risk.    Baseline 85/27/22- 13 sec without an assistive device. 85/03/2020- TUG= 12.5 sec without UE support     Time 12    Period Weeks    Status New      PT LONG TERM GOAL #5   Title Patient will demonstrate improved overall ambulation >  800 feet on all surfaces without an assistive device without loss of balance for improved community distances.    Baseline 02/17/2020-Patient reports limited ambulation in [place] to approx 1 block    Time 12    Period Weeks    Status New                 Plan - 04/18/20 0962    Clinical Impression Statement Treatment limited today secondary to complaining of left knee pain. Assessed left knee with patient  Presents with good ROM and improved post treatment. Patient responded well to ROM activities and ice and reported no pain after session. Patient will continue to benefit from skilled PT interventions to continue to progress his strength, balance and mobility to reduce risk of falling and restore indpendent mobility    Personal Factors and Comorbidities Age;Comorbidity 1    Comorbidities HTN    Examination-Activity Limitations Reach Overhead;Stairs;Dressing;Carry;Locomotion Level    Examination-Participation Restrictions Yard Work    Stability/Clinical Decision Making Stable/Uncomplicated    Rehab Potential Good    PT Frequency 2x / week    PT Duration 12 weeks    PT Treatment/Interventions Cryotherapy;Moist Heat;DME Instruction;Gait training;Stair training;Functional mobility training;Therapeutic activities;Therapeutic exercise;Balance training;Neuromuscular re-education;Patient/family education;Manual techniques;Passive range of motion    PT Next Visit Plan Continue with progressive LE strengthening, balance, gait training    PT Home Exercise Plan Instructed to hold on HEP for 1 week secondary to acute left knee pain    Consulted and Agree with Plan of Care Patient           Patient will benefit from skilled therapeutic intervention in order to improve the following deficits and impairments:  Abnormal gait,Decreased activity  tolerance,Cardiopulmonary status limiting activity,Decreased balance,Decreased endurance,Decreased mobility,Decreased strength,Difficulty walking,Impaired UE functional use  Visit Diagnosis: Difficulty in walking, not elsewhere classified  Muscle weakness (generalized)     Problem List Patient Active Problem List   Diagnosis Date Noted  . Diffuse brain atrophy ([place]) 12/09/2019  . Balance problem 12/03/2019  . Acquired thrombophilia ([place]) 12/03/2019  . Prediabetes 05/29/2019  . Benign prostatic hyperplasia with urinary frequency 08/20/2018  . Hearing aid worn 08/16/2017  . Paronychia of finger, left 08/16/2017  . Broca's aphasia 08/16/2017  . History of cataract surgery 08/27/2016  . Age-related macular degeneration 08/27/2016  . Counseling regarding end of life decision making 08/27/2016  . Do not resuscitate status 08/27/2016  . BPH associated with nocturia 02/11/2015  . PAF (paroxysmal atrial fibrillation) ([place]) 12/14/2013  . Hyperlipidemia LDL goal <100 12/12/2013  . CVD (cerebrovascular disease) 12/10/2013  . GERD (gastroesophageal reflux disease) 07/25/2013  . Left arm numbness 07/25/2013  . Chronic constipation 01/21/2013  . Encounter for preventive health examination 01/21/2013  . Essential hypertension, benign 10/21/2012  . Glaucoma 10/21/2012  . History of colonic polyps 10/21/2012  . Ureteral stricture, right 10/21/2012    Lewis Moccasin, PT 04/18/2020, 9:45 AM  [place] MAIN [facility] SERVICES [address] [place], [place], [address] Phone: [phone]   Fax:  [phone]  Name: QUAYSHAUN HUBBERT MRN: [id number] Date of Birth: [birth year]

## 2020-04-18 NOTE — Therapy (Signed)
[place] MAIN [facility] SERVICES [address] [place], [place], [address] Phone: [phone]   Fax:  [phone]  Speech Language Pathology Treatment DISCHARGE SUMMARY  Patient Details  Name: Todd Golden MRN: [id number] Date of Birth: 09-01-1930 Referring Provider (SLP): Fara Olden   Encounter Date: 04/18/2020   End of Session - 04/18/20 1253    Visit Number 18    Number of Visits 72    Date for SLP Re-Evaluation 06/16/20    Authorization Type [employer]    Authorization Time Period 03/23/2020 thru 06/16/2020    Authorization - Visit Number 8    Progress Note Due on Visit 10    SLP Start Time 0800    SLP Stop Time  0900    SLP Time Calculation (min) 60 min    Activity Tolerance Patient tolerated treatment well           Past Medical History:  Diagnosis Date  . Allergy   . Arthritis   . Asthma   . Cataract   . Chronic constipation   . Colon polyps   . Cough   . GERD (gastroesophageal reflux disease)   . Glaucoma   . HOH (hard of hearing)    a. Uses hearing aids  . Hyperlipidemia   . Hypertension   . Macular degeneration   . Nephrolithiasis    stent  . PAF (paroxysmal atrial fibrillation) ([place])    a. 11/2013 Dx in setting of L MCA stroke. CHA2DS2VASc = 5-->Eliquis; b. 11/2013 Echo: Nl LV fxn. Mildly dil LA.  . Stroke ([place])    a. 11/2013 L MCA stroke.  Marland Kitchen Ureteral stricture     Past Surgical History:  Procedure Laterality Date  . CATARACT EXTRACTION W/PHACO Left 12/20/2014   Procedure: CATARACT EXTRACTION PHACO AND INTRAOCULAR LENS PLACEMENT (IOC);  Surgeon: Birder Robson, MD;  Location: [facility] ORS;  Service: Ophthalmology;  Laterality: Left;   [place]     00:59.6 AP%    21.2 CDE    12.66 fluid pack lot #[id number] H  . JOINT REPLACEMENT Left 2014   knee partial replacement  . kidney stent Right 1995    There were no vitals filed for this visit.   Subjective Assessment - 04/18/20 1245    Subjective Pt  with improved fluency and as such improved speech intelligibility    Patient is accompained by: Family member    Currently in Pain? No/denies            Neuro   ST   TX   NOTE          Treatment Data and Patient's Response to Treatment   Skilled treatment session focused on pt's cognitive communication deficits. SLP facilitiated session by providing conversation starters and moderate assistance for pt to utilize strategies to increase overall expressive communication. Specifically, cues were provided to slow rate of speech. Pt has much improved emergent awareness of rate and was purposeful about rate after cue.  Pt's wife reports that pt "gets angry" when he can't express himself d/t memory loss. Pt frequently attempts description of targeted word but his descriptions are largely incorrect d/t memory deficits. Additional compensatory external memory strategies were provided as well as compensatory strategies to aid in fluency during phone conversations.   DISCHARGE SUMMARY Pt and his wife have attended every ST session and as a result, he has made good progress towards his goals. At this time, pt has met his STG and LTGs. Per their report, pt  is using a slow rate of speech more frequently and thus his speech is more intelligible. Despite pt's best efforts, his speech continues to be c/b dysfluencies and is halting d/t overall deficits in cognitive communication, most markedly memory. Pt and his wife understand and are using all compensatory strategies at this time. They feel that discharge from [facility] is appropriate at this time.          SLP Education - 04/18/20 1252    Education Details expressive communication strategies    Person(s) Educated Patient;Spouse    Methods Explanation;Demonstration;Handout;Verbal cues    Comprehension Verbalized understanding            SLP Short Term Goals - 04/18/20 1254      SLP SHORT TERM GOAL #1   Title With minimal cues, pt will read list of 10  functional phrases at slow rate to reduce dysfluencies to < 25%.    Status Achieved      SLP SHORT TERM GOAL #2   Title Given maximal support, pt will demonstrate emergent awareness of dysfluencies by self-monitoring and self-correcting dysfluencies in 5 out of 10 opportunities.    Status Achieved      SLP SHORT TERM GOAL #3   Title With minimal to moderate assistance, pt will generate 6 items in a basic category in 7 out of 10 opportunities.    Status Achieved      SLP SHORT TERM GOAL #5   Title With minimal cues, pt will read list of 5 functional phrases specific to pt using a slow rate to reduce dysfluencies to < 50%.    Status Achieved      SLP SHORT TERM GOAL #6   Title With minimal assistance, pt will generate 6 items in functional/familiar category in 7 out of 10 opportunities.    Status Achieved            SLP Long Term Goals - 04/18/20 1255      SLP LONG TERM GOAL #1   Title Pt will utilize speech intelligibility strategies to achieve > 50% intelligibility at the simple sentences level in 5 of 10 opportunities.    Status Achieved      SLP LONG TERM GOAL #2   Title Pt will utilize word finding strategies to convey basic wants and needs in 50% of opportunities.    Status Achieved            Plan - 04/18/20 1253    Treatment/Interventions Compensatory techniques;SLP instruction and feedback;Patient/family education;Language facilitation;Functional tasks;Internal/external aids;Cueing hierarchy    Potential to Achieve Goals Fair    Potential Considerations Co-morbidities;Medical prognosis;Severity of impairments    SLP Home Exercise Plan provided, see instruction section    Consulted and Agree with Plan of Care Patient;Family member/caregiver    Family Member Consulted pt's wife           Patient will benefit from skilled therapeutic intervention in order to improve the following deficits and impairments:   Cognitive communication deficit  Diffuse brain atrophy  [facility])    Problem List Patient Active Problem List   Diagnosis Date Noted  . Diffuse brain atrophy ([place]) 12/09/2019  . Balance problem 12/03/2019  . Acquired thrombophilia ([place]) 12/03/2019  . Prediabetes 05/29/2019  . Benign prostatic hyperplasia with urinary frequency 08/20/2018  . Hearing aid worn 08/16/2017  . Paronychia of finger, left 08/16/2017  . Broca's aphasia 08/16/2017  . History of cataract surgery 08/27/2016  . Age-related macular degeneration 08/27/2016  . Counseling regarding  end of life decision making 08/27/2016  . Do not resuscitate status 08/27/2016  . BPH associated with nocturia 02/11/2015  . PAF (paroxysmal atrial fibrillation) ([place]) 12/14/2013  . Hyperlipidemia LDL goal <100 12/12/2013  . CVD (cerebrovascular disease) 12/10/2013  . GERD (gastroesophageal reflux disease) 07/25/2013  . Left arm numbness 07/25/2013  . Chronic constipation 01/21/2013  . Encounter for preventive health examination 01/21/2013  . Essential hypertension, benign 10/21/2012  . Glaucoma 10/21/2012  . History of colonic polyps 10/21/2012  . Ureteral stricture, right 10/21/2012   Mykiah Schmuck B. Rutherford Nail M.S., CCC-SLP, [place] Pathologist Rehabilitation Services Office [phone]  Stormy Fabian 04/18/2020, 12:55 PM  [place] MAIN [facility] SERVICES [address] [place], [place], [address] Phone: [phone]   Fax:  [phone]   Name: Todd Golden MRN: [id number] Date of Birth: 07/30/1930

## 2020-04-21 ENCOUNTER — Ambulatory Visit: Payer: PPO

## 2020-04-21 ENCOUNTER — Encounter: Payer: PPO | Admitting: Speech Pathology

## 2020-04-25 ENCOUNTER — Ambulatory Visit: Payer: PPO | Admitting: Speech Pathology

## 2020-04-25 ENCOUNTER — Ambulatory Visit: Payer: PPO | Admitting: Physical Therapy

## 2020-04-27 DIAGNOSIS — J301 Allergic rhinitis due to pollen: Secondary | ICD-10-CM | POA: Diagnosis not present

## 2020-04-27 DIAGNOSIS — J3089 Other allergic rhinitis: Secondary | ICD-10-CM | POA: Diagnosis not present

## 2020-04-28 ENCOUNTER — Ambulatory Visit: Payer: PPO

## 2020-04-28 ENCOUNTER — Encounter: Payer: PPO | Admitting: Speech Pathology

## 2020-04-28 DIAGNOSIS — H353211 Exudative age-related macular degeneration, right eye, with active choroidal neovascularization: Secondary | ICD-10-CM | POA: Diagnosis not present

## 2020-05-02 ENCOUNTER — Encounter: Payer: PPO | Admitting: Speech Pathology

## 2020-05-02 ENCOUNTER — Ambulatory Visit: Payer: PPO | Admitting: Physical Therapy

## 2020-05-02 DIAGNOSIS — J301 Allergic rhinitis due to pollen: Secondary | ICD-10-CM | POA: Diagnosis not present

## 2020-05-02 DIAGNOSIS — J3089 Other allergic rhinitis: Secondary | ICD-10-CM | POA: Diagnosis not present

## 2020-05-02 DIAGNOSIS — J3081 Allergic rhinitis due to animal (cat) (dog) hair and dander: Secondary | ICD-10-CM | POA: Diagnosis not present

## 2020-05-03 DIAGNOSIS — R4701 Aphasia: Secondary | ICD-10-CM | POA: Diagnosis not present

## 2020-05-03 DIAGNOSIS — R200 Anesthesia of skin: Secondary | ICD-10-CM | POA: Diagnosis not present

## 2020-05-03 DIAGNOSIS — Z9181 History of falling: Secondary | ICD-10-CM | POA: Diagnosis not present

## 2020-05-03 DIAGNOSIS — E538 Deficiency of other specified B group vitamins: Secondary | ICD-10-CM | POA: Diagnosis not present

## 2020-05-03 DIAGNOSIS — Z8673 Personal history of transient ischemic attack (TIA), and cerebral infarction without residual deficits: Secondary | ICD-10-CM | POA: Diagnosis not present

## 2020-05-03 DIAGNOSIS — R531 Weakness: Secondary | ICD-10-CM | POA: Diagnosis not present

## 2020-05-04 ENCOUNTER — Other Ambulatory Visit: Payer: Self-pay

## 2020-05-04 ENCOUNTER — Ambulatory Visit: Payer: PPO | Admitting: Nurse Practitioner

## 2020-05-04 ENCOUNTER — Encounter: Payer: Self-pay | Admitting: Nurse Practitioner

## 2020-05-04 ENCOUNTER — Ambulatory Visit (INDEPENDENT_AMBULATORY_CARE_PROVIDER_SITE_OTHER): Payer: PPO | Admitting: Nurse Practitioner

## 2020-05-04 VITALS — BP 120/78 | HR 90 | Ht 71.0 in | Wt 193.5 lb

## 2020-05-04 DIAGNOSIS — E782 Mixed hyperlipidemia: Secondary | ICD-10-CM

## 2020-05-04 DIAGNOSIS — I4819 Other persistent atrial fibrillation: Secondary | ICD-10-CM

## 2020-05-04 DIAGNOSIS — I10 Essential (primary) hypertension: Secondary | ICD-10-CM | POA: Diagnosis not present

## 2020-05-04 NOTE — Patient Instructions (Signed)
Medication Instructions:  No changes  *If you need a refill on your cardiac medications before your next appointment, please call your pharmacy*   Lab Work: None  If you have labs (blood work) drawn today and your tests are completely normal, you will receive your results only by: Marland Kitchen MyChart Message (if you have MyChart) OR . A paper copy in the mail If you have any lab test that is abnormal or we need to change your treatment, we will call you to review the results.   Testing/Procedures: None   Follow-Up: At [facility], you and your health needs are our priority.  As part of our continuing mission to provide you with exceptional heart care, we have created designated Provider Care Teams.  These Care Teams include your primary Cardiologist (physician) and Advanced Practice Providers (APPs -  Physician Assistants and Nurse Practitioners) who all work together to provide you with the care you need, when you need it.  We recommend signing up for the patient portal called "MyChart".  Sign up information is provided on this After Visit Summary.  MyChart is used to connect with patients for Virtual Visits (Telemedicine).  Patients are able to view lab/test results, encounter notes, upcoming appointments, etc.  Non-urgent messages can be sent to your provider as well.   To learn more about what you can do with MyChart, go to [email or web address].    Your next appointment:   6 month(s)  The format for your next appointment:   In Person  Provider:   Kathlyn Sacramento, MD

## 2020-05-04 NOTE — Progress Notes (Signed)
Office Visit    Patient Name: Todd Golden Date of Encounter: 05/04/2020  Primary Care Provider:  Crecencio Mc, MD Primary Cardiologist:  Kathlyn Sacramento, MD  Chief Complaint    87 year old male with a history of paroxysmal atrial fibrillation, prior stroke, hypertension, hyperlipidemia, and glaucoma, who presents for follow-up related to persistent atrial fibrillation.  Past Medical History    Past Medical History:  Diagnosis Date  . Allergy   . Arthritis   . Asthma   . Cataract   . Chronic constipation   . Colon polyps   . Cough   . GERD (gastroesophageal reflux disease)   . Glaucoma   . HOH (hard of hearing)    a. Uses hearing aids  . Hyperlipidemia   . Hypertension   . Macular degeneration   . Nephrolithiasis    stent  . PAF (paroxysmal atrial fibrillation) ([place])    a. 11/2013 Dx in setting of L MCA stroke. CHA2DS2VASc = 5-->Eliquis; b. 11/2013 Echo: Nl LV fxn. Mildly dil LA.  . Stroke ([place])    a. 11/2013 L MCA stroke.  Marland Kitchen Ureteral stricture    Past Surgical History:  Procedure Laterality Date  . CATARACT EXTRACTION W/PHACO Left 12/20/2014   Procedure: CATARACT EXTRACTION PHACO AND INTRAOCULAR LENS PLACEMENT (IOC);  Surgeon: Birder Robson, MD;  Location: [facility] ORS;  Service: Ophthalmology;  Laterality: Left;   [place]     00:59.6 AP%    21.2 CDE    12.66 fluid pack lot #[id number]  . JOINT REPLACEMENT Left 2014   knee partial replacement  . kidney stent Right 1995    Allergies  No Known Allergies  History of Present Illness    87 year old male with above past medical history including persistent atrial fibrillation, prior stroke, hypertension, hyperlipidemia, and glaucoma.  In November 2015, he was admitted with expressive aphasia and mild ataxia due to left MCA stroke in the setting of A. fib.  Echo showed normal LV function while carotid Doppler showed no obstructive disease.  He was managed with Eliquis and did well over subsequent years.  At last  clinic visit in September 2021, he was noted to be in asymptomatic rate-controlled atrial fibrillation.  Decision was made to continue rate control strategy and he has remained on pindolol therapy.  In November 2021, patient fell after losing his balance while doing some yard work, and struck his head on a piece of [employer].  He presented for CT of the head which showed no acute findings.  He says today that after discussing it with his primary care provider and out of concern for more frequent falls in the setting of unsteady gait at baseline (fell in the kitchen about 2 weeks ago), that he was taken off of Eliquis.  He has been followed by neurology and speech pathology in the setting of ongoing aphasia and was placed on aspirin 81 mg daily yesterday.  He feels as though his aphasia has improved with therapy.  His neurologist also placed him on Aricept yesterday.  From a cardiac standpoint, he believes he has done well.  He remains very active in his yard and also goes on walks with his wife.  He denies chest pain, dyspnea, palpitations, PND, orthopnea, dizziness, syncope, edema, or early satiety.  Home Medications    Prior to Admission medications   Medication Sig Start Date End Date Taking? Authorizing Provider  amLODipine (NORVASC) 5 MG tablet Take 1 tablet (5 mg total) by mouth daily. 07/12/19  Crecencio Mc, MD  doxazosin (CARDURA) 4 MG tablet Take 1 tablet (4 mg total) by mouth daily. 04/03/20   Wellington Hampshire, MD  EPINEPHrine 0.3 mg/0.3 mL IJ SOAJ injection Inject 0.3 mg into the muscle as needed for anaphylaxis.    [provider]  hydrochlorothiazide (HYDRODIURIL) 25 MG tablet TAKE 1 TABLET(25 MG) BY MOUTH DAILY 11/22/19   Crecencio Mc, MD  latanoprost (XALATAN) 0.005 % ophthalmic solution Place 1 drop into both eyes at bedtime. 10/03/12   [provider]  Multiple Vitamins-Minerals (PRESERVISION AREDS 2 PO) Take by mouth daily.    [provider]   omeprazole (PRILOSEC) 40 MG capsule TAKE 1 CAPSULE(40 MG) BY MOUTH DAILY 10/11/19   Crecencio Mc, MD  pindolol (VISKEN) 10 MG tablet TAKE 1 TABLET(10 MG) BY MOUTH DAILY 02/28/20   Crecencio Mc, MD  simvastatin (ZOCOR) 20 MG tablet TAKE 1 TABLET BY MOUTH EVERY DAY 12/03/19   Crecencio Mc, MD    Review of Systems    He denies chest pain, palpitations, dyspnea, pnd, orthopnea, n, v, dizziness, syncope, edema, weight gain, or early satiety.  Occasional unsteady gait and falls.  All other systems reviewed and are otherwise negative except as noted above.  Physical Exam    VS:  BP 120/78 (BP Location: Left Arm, Patient Position: Sitting, Cuff Size: Normal)   Pulse 90   Ht 5\' 11"  (1.803 m)   Wt 193 lb 8 oz (87.8 kg)   SpO2 98%   BMI 26.99 kg/m  , BMI Body mass index is 26.99 kg/m. GEN: Well nourished, well developed, in no acute distress. HEENT: normal. Neck: Supple, no JVD, carotid bruits, or masses. Cardiac: Irregularly irregular, 2/6 systolic murmur at the upper sternal borders, no rubs, or gallops. No clubbing, cyanosis, edema.  Radials 2+/PT 1+ and equal bilaterally.  Respiratory:  Respirations regular and unlabored, clear to auscultation bilaterally. GI: Soft, nontender, nondistended, BS + x 4. MS: no deformity or atrophy. Skin: warm and dry, no rash. Neuro:  Strength and sensation are intact. Psych: Normal affect.  Accessory Clinical Findings    ECG personally reviewed by me today -atrial fibrillation, 90, left axis deviation, left anterior fascicular block, nonspecific ST changes - no acute changes.  Lab Results  Component Value Date   WBC 5.5 12/01/2019   HGB 13.8 12/01/2019   HCT 41.6 12/01/2019   MCV 92.8 12/01/2019   PLT 228.0 12/01/2019   Lab Results  Component Value Date   CREATININE 1.11 12/01/2019   BUN 18 12/01/2019   NA 139 12/01/2019   K 4.1 12/01/2019   CL 100 12/01/2019   CO2 32 12/01/2019   Lab Results  Component Value Date   ALT 21  12/01/2019   AST 22 12/01/2019   ALKPHOS 54 12/01/2019   BILITOT 0.8 12/01/2019   Lab Results  Component Value Date   CHOL 131 12/01/2019   HDL 50.90 12/01/2019   LDLCALC 62 12/01/2019   TRIG 89.0 12/01/2019   CHOLHDL 3 12/01/2019    Lab Results  Component Value Date   HGBA1C 6.3 12/01/2019    Assessment & Plan    1.  Persistent atrial fibrillation: Atrial fibrillation was initially diagnosed in 2015 in the setting of left MCA stroke.  He had been maintaining sinus rhythm but was noted to be A. fib at office visit on September 30.  He is asymptomatic and remains reasonably rate controlled at 90 bpm.  He was taken off of  oral anticoagulation by primary care in November in the setting of fall at that time.  He has fallen on a few occasions since then, most recently while walking in his kitchen about 2 weeks ago.  He did not hit his head at that time and did not suffer any other trauma.  He remains on beta-blocker and aspirin therapy.  2.  Essential hypertension: Stable beta-blocker, diuretic, and calcium channel blocker therapy.  3.  Hyperlipidemia: Remains on simvastatin with an LDL of 60 23 November 2019.  4.  Unsteady gait/falls: He notes sometimes he loses his balance and will fall suddenly.  Last occurred 2 weeks ago.  As above, he is no longer on Eliquis.  5.  Disposition: Follow-up in 6 months or sooner if necessary.   Murray Hodgkins, NP 05/04/2020, 4:44 PM

## 2020-05-05 ENCOUNTER — Ambulatory Visit: Payer: PPO | Attending: Internal Medicine

## 2020-05-05 ENCOUNTER — Encounter: Payer: PPO | Admitting: Speech Pathology

## 2020-05-05 DIAGNOSIS — R278 Other lack of coordination: Secondary | ICD-10-CM | POA: Diagnosis not present

## 2020-05-05 DIAGNOSIS — R262 Difficulty in walking, not elsewhere classified: Secondary | ICD-10-CM | POA: Diagnosis not present

## 2020-05-05 DIAGNOSIS — R269 Unspecified abnormalities of gait and mobility: Secondary | ICD-10-CM | POA: Insufficient documentation

## 2020-05-05 DIAGNOSIS — M6281 Muscle weakness (generalized): Secondary | ICD-10-CM | POA: Diagnosis not present

## 2020-05-05 NOTE — Therapy (Signed)
[address] MAIN [facility] SERVICES [address] [place], [place], [address] Phone: [phone]   Fax:  [phone]  Physical Therapy Treatment  Patient Details  Name: Todd Golden MRN: [id number] Date of Birth: 12-25-1930 Referring Provider (PT): Dr. Deborra Medina   Encounter Date: 05/05/2020   PT End of Session - 05/05/20 1019    Visit Number 16    Number of Visits 25    Date for PT Re-Evaluation 05/11/20    Authorization Type 02/17/2020- Initial PT evaluation    PT Start Time 1016    PT Stop Time 1059    PT Time Calculation (min) 43 min    Equipment Utilized During Treatment Gait belt    Activity Tolerance Patient tolerated treatment well    Behavior During Therapy [facility] for tasks assessed/performed           Past Medical History:  Diagnosis Date  . Allergy   . Arthritis   . Asthma   . Cataract   . Chronic constipation   . Colon polyps   . Cough   . GERD (gastroesophageal reflux disease)   . Glaucoma   . HOH (hard of hearing)    a. Uses hearing aids  . Hyperlipidemia   . Hypertension   . Macular degeneration   . Nephrolithiasis    stent  . PAF (paroxysmal atrial fibrillation) ([place])    a. 11/2013 Dx in setting of L MCA stroke. CHA2DS2VASc = 5-->Eliquis; b. 11/2013 Echo: Nl LV fxn. Mildly dil LA.  . Stroke ([place])    a. 11/2013 L MCA stroke.  Marland Kitchen Ureteral stricture     Past Surgical History:  Procedure Laterality Date  . CATARACT EXTRACTION W/PHACO Left 12/20/2014   Procedure: CATARACT EXTRACTION PHACO AND INTRAOCULAR LENS PLACEMENT (IOC);  Surgeon: Birder Robson, MD;  Location: [facility] ORS;  Service: Ophthalmology;  Laterality: Left;   [place]     00:59.6 AP%    21.2 CDE    12.66 fluid pack lot #[id number]  . JOINT REPLACEMENT Left 2014   knee partial replacement  . kidney stent Right 1995    There were no vitals filed for this visit.   Subjective Assessment - 05/05/20 1017    Subjective Pt reports his knee pain is gone.  Hasn't had any falls since previous PT session. Still mowing yard consistently.    Patient is accompained by: Family member    Limitations Walking    Patient Stated Goals Patient reports his goal not to fall and regain some strength in his legs.    Currently in Pain? No/denies    Pain Onset In the past 7 days          There.ex:   Nu-Step L3 for 5 min for warm-up and reassess any pain in L knee with mobility. Not billed.     Neuro Re-Ed:   SLS at balance bar for 5 min. Able to stand on LLE for 7 sec before needing UE support. 5 sec on RLE. Multiple attempts/LE before attaining these amounts of time. CGA  Tandem stance at balance bar: 5 min   RLE under BOS: 9 sec    LLE under BOS: 15 sec.   Multiple attempts to obtain amounts of time. With RLE under BOS, not full tandem stance achieved. Required modified tandem with L heel touching medial aspect of R toe of shoe. CGA   Standing on foam at balance bar to challenge ankle/hip strategy CGA:    NBOS: 2x30 sec.  First set pt displayed ant/post sway due to poor ankle strategy. VC's to reduce sway with good carryover on second set. Ant/post sway still present,but minimally.    WBOS: 1x1 min. No LOB or difficulty.   Obstacle course navigation. CGA:    x2. Alternating cone taps (x5/LE) --> forward walking over red mat mimic unstable surface --> cone weaves --> 1/2 bolster step over --> 6" forward step up and over. Multiple attempts for SLS stance on cone taps and required stopping gait when stepping onto and off of unstable mat surface. Intermittent minA+1 with cone taps due to noted lateral sway.  x2. Alternating cone taps (x5/LE) --> side stepping over red mat mimic unstable surface --> cone weaves --> 1/2 bolster side step over --> 6" side step up and over. Multiple attempts for SLS stance on cone taps and required stopping gait when stepping onto and off of unstable mat surface. Max VC/TC to maintain correct body positioning for lateral stepping,  step over tasks. Intermittent minA+1 with cone taps due to noted lateral sway.   Backwards walking 20' x4 to challenge post stepping strategy due to pt reports of post LOB. VC's for wider BOS and equal steps. Limited carryover in ability to perform step through pattern due to limited step through on RLE. Returns to narrow stance and shuffling steps when attempting dual task on last bout with holding conversation. Variable step lengths to regain balance and CGA throughout.    PT Education - 05/05/20 1018    Education Details form/technique with balance exercises.    Person(s) Educated Patient    Methods Explanation;Demonstration;Tactile cues;Verbal cues    Comprehension Verbalized understanding;Returned demonstration            PT Short Term Goals - 03/23/20 1132      PT SHORT TERM GOAL #1   Title Patient will be independent in home exercise program to improve strength/mobility for better functional independence with ADLs.    Baseline 02/17/2020- Patient has no formal HEP in place. 03/23/2020- Pt reports mostly compliant with walking program and will benefit from continued balance HEP.    Time 6    Period Weeks    Status On-going    Target Date 03/30/20             PT Long Term Goals - 03/23/20 1134      PT LONG TERM GOAL #1   Title Patient will increase FOTO score to equal to or greater than 69 to demonstrate statistically significant improvement in mobility and quality of life.    Baseline 02/17/2020= 59    Time 12    Period Weeks    Status New      PT LONG TERM GOAL #2   Title Patient (> 1 years old) will complete five times sit to stand test in < 15 seconds indicating an increased LE strength and improved balance.    Baseline 02/17/2020- 22 sec with no UE support. 03/23/2020- 15.57 sec without UE support    Time 12    Period Weeks    Status New      PT LONG TERM GOAL #3   Title Pt will improve BERG from 50/56 to 53/56 or higher in order to demonstrate clinically  significant improvement in balance.    Baseline 02/17/2020= 50/56    Time 12    Period Weeks    Status New      PT LONG TERM GOAL #4   Title Pt will decrease TUG to below  11 seconds/decrease in order to demonstrate decreased fall risk.    Baseline 02/17/20- 13 sec without an assistive device. 03/23/2020- TUG= 12.5 sec without UE support    Time 12    Period Weeks    Status New      PT LONG TERM GOAL #5   Title Patient will demonstrate improved overall ambulation > 800 feet on all surfaces without an assistive device without loss of balance for improved community distances.    Baseline 02/17/2020-Patient reports limited ambulation in [place] community to approx 1 block    Time 12    Period Weeks    Status New                 Plan - 05/05/20 1102    Clinical Impression Statement Pt progressed through static and dynamic balance tasks to challenge SLS and ankle/hip strategy. MUltimodal cuing to optimize correct form/technique required with fair to good carryover depending on task. Most difficulty with backwards walking, lateral step ups, and maintaining SLS requiring variable step lengths and multiple attempts for these tasks due to poor balance. Pt can benefit from further skilled PT treatment to reduce pt's risk of falls.    Personal Factors and Comorbidities Age;Comorbidity 1    Comorbidities HTN    Examination-Activity Limitations Reach Overhead;Stairs;Dressing;Carry;Locomotion Level    Examination-Participation Restrictions Yard Work    Stability/Clinical Decision Making Stable/Uncomplicated    Rehab Potential Good    PT Frequency 2x / week    PT Duration 12 weeks    PT Treatment/Interventions Cryotherapy;Moist Heat;DME Instruction;Gait training;Stair training;Functional mobility training;Therapeutic activities;Therapeutic exercise;Balance training;Neuromuscular re-education;Patient/family education;Manual techniques;Passive range of motion    PT Next Visit Plan Continue  with progressive LE strengthening, balance, gait training    PT Home Exercise Plan Instructed to hold on HEP for 1 week secondary to acute left knee pain    Consulted and Agree with Plan of Care Patient           Patient will benefit from skilled therapeutic intervention in order to improve the following deficits and impairments:  Abnormal gait,Decreased activity tolerance,Cardiopulmonary status limiting activity,Decreased balance,Decreased endurance,Decreased mobility,Decreased strength,Difficulty walking,Impaired UE functional use  Visit Diagnosis: Difficulty in walking, not elsewhere classified  Muscle weakness (generalized)     Problem List Patient Active Problem List   Diagnosis Date Noted  . Diffuse brain atrophy ([place]) 12/09/2019  . Balance problem 12/03/2019  . Acquired thrombophilia ([place]) 12/03/2019  . Prediabetes 05/29/2019  . Benign prostatic hyperplasia with urinary frequency 08/20/2018  . Hearing aid worn 08/16/2017  . Paronychia of finger, left 08/16/2017  . Broca's aphasia 08/16/2017  . History of cataract surgery 08/27/2016  . Age-related macular degeneration 08/27/2016  . Counseling regarding end of life decision making 08/27/2016  . Do not resuscitate status 08/27/2016  . BPH associated with nocturia 02/11/2015  . PAF (paroxysmal atrial fibrillation) ([place]) 12/14/2013  . Hyperlipidemia LDL goal <100 12/12/2013  . CVD (cerebrovascular disease) 12/10/2013  . GERD (gastroesophageal reflux disease) 07/25/2013  . Left arm numbness 07/25/2013  . Chronic constipation 01/21/2013  . Encounter for preventive health examination 01/21/2013  . Essential hypertension, benign 10/21/2012  . Glaucoma 10/21/2012  . History of colonic polyps 10/21/2012  . Ureteral stricture, right 10/21/2012    Salem Caster. Fairly IV, PT, DPT Physical Therapist- [facility]  05/05/2020, 11:15 AM  [place] MAIN [facility]  SERVICES [address] [place], [place], [address] Phone: [phone]  Fax:  [phone]  Name: Todd Golden MRN: [id number] Date of Birth: August 18, 1930

## 2020-05-08 ENCOUNTER — Other Ambulatory Visit: Payer: Self-pay

## 2020-05-08 ENCOUNTER — Ambulatory Visit: Payer: PPO

## 2020-05-08 DIAGNOSIS — R278 Other lack of coordination: Secondary | ICD-10-CM

## 2020-05-08 DIAGNOSIS — R269 Unspecified abnormalities of gait and mobility: Secondary | ICD-10-CM

## 2020-05-08 DIAGNOSIS — M6281 Muscle weakness (generalized): Secondary | ICD-10-CM

## 2020-05-08 DIAGNOSIS — R262 Difficulty in walking, not elsewhere classified: Secondary | ICD-10-CM | POA: Diagnosis not present

## 2020-05-08 NOTE — Therapy (Signed)
[place] MAIN [facility] SERVICES [address] [place], [place], [address] Phone: [phone]   Fax:  [phone]  Physical Therapy Treatment/Discharge Summary  Patient Details  Name: Todd Golden MRN: [id number] Date of Birth: [birth year] Referring Provider (PT): Dr. Deborra Medina   Encounter Date: 05/08/2020   PT End of Session - 05/08/20 1021    Visit Number 17    Number of Visits 25    Date for PT Re-Evaluation 05/11/20    Authorization Type 02/17/2020- Initial PT evaluation    PT Start Time 1017    PT Stop Time 1059    PT Time Calculation (min) 42 min    Equipment Utilized During Treatment Gait belt    Activity Tolerance Patient tolerated treatment well    Behavior During Therapy [facility] for tasks assessed/performed           Past Medical History:  Diagnosis Date  . Allergy   . Arthritis   . Asthma   . Cataract   . Chronic constipation   . Colon polyps   . Cough   . GERD (gastroesophageal reflux disease)   . Glaucoma   . HOH (hard of hearing)    a. Uses hearing aids  . Hyperlipidemia   . Hypertension   . Macular degeneration   . Nephrolithiasis    stent  . PAF (paroxysmal atrial fibrillation) ([place])    a. 11/2013 Dx in setting of L MCA stroke. CHA2DS2VASc = 5-->Eliquis; b. 11/2013 Echo: Nl LV fxn. Mildly dil LA.  . Stroke ([place])    a. 11/2013 L MCA stroke.  Marland Kitchen Ureteral stricture     Past Surgical History:  Procedure Laterality Date  . CATARACT EXTRACTION W/PHACO Left 12/20/2014   Procedure: CATARACT EXTRACTION PHACO AND INTRAOCULAR LENS PLACEMENT (IOC);  Surgeon: Birder Robson, MD;  Location: [facility] ORS;  Service: Ophthalmology;  Laterality: Left;   [place]     00:59.6 AP%    21.2 CDE    12.66 fluid pack lot #[id number]  . JOINT REPLACEMENT Left 2014   knee partial replacement  . kidney stent Right 1995    There were no vitals filed for this visit.   Subjective Assessment - 05/08/20 1025    Subjective Patient reports he  feels like he is doing well overall- No falls and no further pain- able to walk, mow and perform all ADLs on his own.    Patient is accompained by: Family member    Limitations Walking    Patient Stated Goals Patient reports his goal not to fall and regain some strength in his legs.    Currently in Pain? No/denies    Pain Onset In the past 7 days              [facility] PT Assessment - 05/08/20 1039      Berg Balance Test   Sit to Stand Able to stand without using hands and stabilize independently    Standing Unsupported Able to stand safely 2 minutes    Sitting with Back Unsupported but Feet Supported on Floor or Stool Able to sit safely and securely 2 minutes    Stand to Sit Sits safely with minimal use of hands    Transfers Able to transfer safely, minor use of hands    Standing Unsupported with Eyes Closed Able to stand 10 seconds safely    Standing Unsupported with Feet Together Able to place feet together independently and stand 1 minute safely    From  Standing, Reach Forward with Outstretched Arm Can reach confidently >25 cm (10")    From Standing Position, Pick up Object from Floor Able to pick up shoe safely and easily    From Standing Position, Turn to Look Behind Over each Shoulder Looks behind from both sides and weight shifts well    Turn 360 Degrees Able to turn 360 degrees safely in 4 seconds or less    Standing Unsupported, Alternately Place Feet on Step/Stool Able to stand independently and safely and complete 8 steps in 20 seconds    Standing Unsupported, One Foot in Front Able to take small step independently and hold 30 seconds    Standing on One Leg Able to lift leg independently and hold equal to or more than 3 seconds    Total Score 52             Reassessed all goals today- Patient met all established goals.  Nustep L3 - 5 min LE only 0.2 mi - Patient rates as "Easy"    Clinical Impression: Reassessed all remaining goals as patient discussed that he felt  like he was doing everything at home and back to his baseline. He reports he is mowing, cooking, walking daily with wife and states no issues. He presents today with improved functional strength as seen by improved 5 time sit to stand, improved DGI score and improved overall gait distance- walking without a device. He also scored a 92 on his FOTO survey indicating a significant improvement in his self-perceived abilities. He is appropriate for discharge today with all goals met and to continue on his own with a home exercise program. Patient in agreement with plan for discharge today.                 PT Education - 05/08/20 1424    Education Details Review of home program.    Person(s) Educated Patient    Methods Explanation;Demonstration    Comprehension Verbalized understanding;Returned demonstration            PT Short Term Goals - 05/08/20 1028      PT SHORT TERM GOAL #1   Title Patient will be independent in home exercise program to improve strength/mobility for better functional independence with ADLs.    Baseline 02/17/2020- Patient has no formal HEP in place. 03/23/2020- Pt reports mostly compliant with walking program and will benefit from continued balance HEP. 4/18- Patient reports working on his home program well on his own and walking without difficulty.    Time 6    Period Weeks    Status Achieved    Target Date 03/30/20             PT Long Term Goals - 05/08/20 1032      PT LONG TERM GOAL #1   Title Patient will increase FOTO score to equal to or greater than 69 to demonstrate statistically significant improvement in mobility and quality of life.    Baseline 02/17/2020= 59; 05/08/2020= 92    Time 12    Period Weeks    Status Achieved      PT LONG TERM GOAL #2   Title Patient (> 47 years old) will complete five times sit to stand test in < 15 seconds indicating an increased LE strength and improved balance.    Baseline 02/17/2020- 22 sec with no UE  support. 03/23/2020- 15.57 sec without UE support. 05/08/2020- 13.68    Time 12    Period Weeks  Status Achieved      PT LONG TERM GOAL #3   Title Pt will improve BERG from 50/56 to 53/56 or higher in order to demonstrate clinically significant improvement in balance.    Baseline 02/17/2020= 50/56; 05/08/2020= 52/56    Time 12    Period Weeks    Status Not Met   Patient did however make improvement with overall balance and denies falls and walking without a device.     PT LONG TERM GOAL #4   Title Pt will decrease TUG to below 11 seconds/decrease in order to demonstrate decreased fall risk.    Baseline 02/17/20- 13 sec without an assistive device. 03/23/2020- TUG= 12.5 sec without UE support. TUG= 10.89 sec without AD.    Time 12    Period Weeks    Status Achieved      PT LONG TERM GOAL #5   Title Patient will demonstrate improved overall ambulation > 800 feet on all surfaces without an assistive device without loss of balance for improved community distances.    Baseline 02/17/2020-Patient reports limited ambulation in [place] community to approx 1 block. 05/08/2020= Patient ambulated approx 1000 feet without UE in 7 min total time today  - exhibiting independent mobility with no loss of balance.    Time 12    Period Weeks    Status Achieved                 Plan - 05/08/20 1027    Clinical Impression Statement Clinical Impression: Reassessed all remaining goals as patient discussed that he felt like he was doing everything at home and back to his baseline. He reports he is mowing, cooking, walking daily with wife and states no issues. He presents today with improved functional strength as seen by improved 5 time sit to stand, improved DGI score and improved overall gait distance- walking without a device. He also scored a 92 on his FOTO survey indicating a significant improvement in his self-perceived abilities. He is appropriate for discharge today with all goals met and to  continue on his own with a home exercise program. Patient in agreement with plan for discharge today.    Personal Factors and Comorbidities Age;Comorbidity 1    Comorbidities HTN    Examination-Activity Limitations Reach Overhead;Stairs;Dressing;Carry;Locomotion Level    Examination-Participation Restrictions Yard Work    Stability/Clinical Decision Making Stable/Uncomplicated    Rehab Potential Good    PT Frequency 2x / week    PT Duration 12 weeks    PT Treatment/Interventions Cryotherapy;Moist Heat;DME Instruction;Gait training;Stair training;Functional mobility training;Therapeutic activities;Therapeutic exercise;Balance training;Neuromuscular re-education;Patient/family education;Manual techniques;Passive range of motion    PT Next Visit Plan Discharge patient to a home program today.    Consulted and Agree with Plan of Care Patient           Patient will benefit from skilled therapeutic intervention in order to improve the following deficits and impairments:  Abnormal gait,Decreased activity tolerance,Cardiopulmonary status limiting activity,Decreased balance,Decreased endurance,Decreased mobility,Decreased strength,Difficulty walking,Impaired UE functional use  Visit Diagnosis: Abnormality of gait and mobility  Difficulty in walking, not elsewhere classified  Muscle weakness (generalized)  Other lack of coordination     Problem List Patient Active Problem List   Diagnosis Date Noted  . Diffuse brain atrophy ([place]) 12/09/2019  . Balance problem 12/03/2019  . Acquired thrombophilia ([place]) 12/03/2019  . Prediabetes 05/29/2019  . Benign prostatic hyperplasia with urinary frequency 08/20/2018  . Hearing aid worn 08/16/2017  . Paronychia of finger, left 08/16/2017  .  Broca's aphasia 08/16/2017  . History of cataract surgery 08/27/2016  . Age-related macular degeneration 08/27/2016  . Counseling regarding end of life decision making 08/27/2016  . Do not resuscitate status  08/27/2016  . BPH associated with nocturia 02/11/2015  . PAF (paroxysmal atrial fibrillation) ([place]) 12/14/2013  . Hyperlipidemia LDL goal <100 12/12/2013  . CVD (cerebrovascular disease) 12/10/2013  . GERD (gastroesophageal reflux disease) 07/25/2013  . Left arm numbness 07/25/2013  . Chronic constipation 01/21/2013  . Encounter for preventive health examination 01/21/2013  . Essential hypertension, benign 10/21/2012  . Glaucoma 10/21/2012  . History of colonic polyps 10/21/2012  . Ureteral stricture, right 10/21/2012    Lewis Moccasin, PT 05/09/2020, 8:49 AM  [place] MAIN [facility] SERVICES [address] [place], [place], [address] Phone: [phone]   Fax:  [phone]  Name: RAMIRO PANGILINAN MRN: [id number] Date of Birth: [birth year]

## 2020-05-09 ENCOUNTER — Encounter: Payer: PPO | Admitting: Speech Pathology

## 2020-05-11 DIAGNOSIS — J3089 Other allergic rhinitis: Secondary | ICD-10-CM | POA: Diagnosis not present

## 2020-05-11 DIAGNOSIS — J301 Allergic rhinitis due to pollen: Secondary | ICD-10-CM | POA: Diagnosis not present

## 2020-05-12 ENCOUNTER — Encounter: Payer: PPO | Admitting: Speech Pathology

## 2020-05-12 ENCOUNTER — Ambulatory Visit: Payer: PPO

## 2020-05-16 ENCOUNTER — Ambulatory Visit: Payer: PPO

## 2020-05-16 ENCOUNTER — Encounter: Payer: PPO | Admitting: Speech Pathology

## 2020-05-16 DIAGNOSIS — J3089 Other allergic rhinitis: Secondary | ICD-10-CM | POA: Diagnosis not present

## 2020-05-16 DIAGNOSIS — J301 Allergic rhinitis due to pollen: Secondary | ICD-10-CM | POA: Diagnosis not present

## 2020-05-19 ENCOUNTER — Encounter: Payer: PPO | Admitting: Speech Pathology

## 2020-05-19 ENCOUNTER — Ambulatory Visit: Payer: PPO

## 2020-05-23 ENCOUNTER — Ambulatory Visit: Payer: PPO

## 2020-05-23 DIAGNOSIS — J301 Allergic rhinitis due to pollen: Secondary | ICD-10-CM | POA: Diagnosis not present

## 2020-05-23 DIAGNOSIS — J3089 Other allergic rhinitis: Secondary | ICD-10-CM | POA: Diagnosis not present

## 2020-05-25 ENCOUNTER — Ambulatory Visit: Payer: PPO

## 2020-05-25 DIAGNOSIS — H353221 Exudative age-related macular degeneration, left eye, with active choroidal neovascularization: Secondary | ICD-10-CM | POA: Diagnosis not present

## 2020-05-29 ENCOUNTER — Ambulatory Visit: Payer: PPO

## 2020-05-30 ENCOUNTER — Telehealth: Payer: Self-pay | Admitting: Internal Medicine

## 2020-05-30 DIAGNOSIS — J301 Allergic rhinitis due to pollen: Secondary | ICD-10-CM | POA: Diagnosis not present

## 2020-05-30 DIAGNOSIS — J3089 Other allergic rhinitis: Secondary | ICD-10-CM | POA: Diagnosis not present

## 2020-05-30 MED ORDER — AMLODIPINE BESYLATE 5 MG PO TABS: 5.0000 mg | ORAL_TABLET | Freq: Every day | ORAL | 1 refills | Status: DC

## 2020-05-30 MED ORDER — SIMVASTATIN 20 MG PO TABS: ORAL_TABLET | ORAL | 1 refills | Status: DC

## 2020-05-30 MED ORDER — HYDROCHLOROTHIAZIDE 25 MG PO TABS: ORAL_TABLET | ORAL | 1 refills | Status: DC

## 2020-05-30 MED ORDER — PINDOLOL 10 MG PO TABS: ORAL_TABLET | ORAL | 0 refills | Status: DC

## 2020-05-30 NOTE — Telephone Encounter (Signed)
PT came into the office to request a refill of his: amLODipine (NORVASC) 5 MG tablet  hydrochlorothiazide (HYDRODIURIL) 25 MG tablet simvastatin (ZOCOR) 20 MG tablet pindolol (VISKEN) 10 MG tablet

## 2020-05-31 ENCOUNTER — Other Ambulatory Visit: Payer: Self-pay | Admitting: Internal Medicine

## 2020-06-02 ENCOUNTER — Ambulatory Visit: Payer: PPO

## 2020-06-06 ENCOUNTER — Ambulatory Visit: Payer: PPO

## 2020-06-06 DIAGNOSIS — J3089 Other allergic rhinitis: Secondary | ICD-10-CM | POA: Diagnosis not present

## 2020-06-06 DIAGNOSIS — J301 Allergic rhinitis due to pollen: Secondary | ICD-10-CM | POA: Diagnosis not present

## 2020-06-09 ENCOUNTER — Ambulatory Visit: Payer: PPO

## 2020-06-13 ENCOUNTER — Ambulatory Visit: Payer: PPO

## 2020-06-13 DIAGNOSIS — J3089 Other allergic rhinitis: Secondary | ICD-10-CM | POA: Diagnosis not present

## 2020-06-13 DIAGNOSIS — J301 Allergic rhinitis due to pollen: Secondary | ICD-10-CM | POA: Diagnosis not present

## 2020-06-16 ENCOUNTER — Ambulatory Visit: Payer: PPO

## 2020-06-20 ENCOUNTER — Ambulatory Visit: Payer: PPO

## 2020-06-20 DIAGNOSIS — J3089 Other allergic rhinitis: Secondary | ICD-10-CM | POA: Diagnosis not present

## 2020-06-20 DIAGNOSIS — J301 Allergic rhinitis due to pollen: Secondary | ICD-10-CM | POA: Diagnosis not present

## 2020-06-23 ENCOUNTER — Ambulatory Visit: Payer: PPO

## 2020-06-26 DIAGNOSIS — J3089 Other allergic rhinitis: Secondary | ICD-10-CM | POA: Diagnosis not present

## 2020-06-26 DIAGNOSIS — J301 Allergic rhinitis due to pollen: Secondary | ICD-10-CM | POA: Diagnosis not present

## 2020-06-27 DIAGNOSIS — J301 Allergic rhinitis due to pollen: Secondary | ICD-10-CM | POA: Diagnosis not present

## 2020-06-27 DIAGNOSIS — J3089 Other allergic rhinitis: Secondary | ICD-10-CM | POA: Diagnosis not present

## 2020-07-04 DIAGNOSIS — J301 Allergic rhinitis due to pollen: Secondary | ICD-10-CM | POA: Diagnosis not present

## 2020-07-04 DIAGNOSIS — J3089 Other allergic rhinitis: Secondary | ICD-10-CM | POA: Diagnosis not present

## 2020-07-05 DIAGNOSIS — R531 Weakness: Secondary | ICD-10-CM | POA: Diagnosis not present

## 2020-07-05 DIAGNOSIS — Z8673 Personal history of transient ischemic attack (TIA), and cerebral infarction without residual deficits: Secondary | ICD-10-CM | POA: Diagnosis not present

## 2020-07-05 DIAGNOSIS — E538 Deficiency of other specified B group vitamins: Secondary | ICD-10-CM | POA: Diagnosis not present

## 2020-07-05 DIAGNOSIS — R413 Other amnesia: Secondary | ICD-10-CM | POA: Diagnosis not present

## 2020-07-05 DIAGNOSIS — R4701 Aphasia: Secondary | ICD-10-CM | POA: Diagnosis not present

## 2020-07-05 DIAGNOSIS — R200 Anesthesia of skin: Secondary | ICD-10-CM | POA: Diagnosis not present

## 2020-07-07 DIAGNOSIS — H353221 Exudative age-related macular degeneration, left eye, with active choroidal neovascularization: Secondary | ICD-10-CM | POA: Diagnosis not present

## 2020-07-07 DIAGNOSIS — H353211 Exudative age-related macular degeneration, right eye, with active choroidal neovascularization: Secondary | ICD-10-CM | POA: Diagnosis not present

## 2020-07-13 DIAGNOSIS — J3089 Other allergic rhinitis: Secondary | ICD-10-CM | POA: Diagnosis not present

## 2020-07-13 DIAGNOSIS — J301 Allergic rhinitis due to pollen: Secondary | ICD-10-CM | POA: Diagnosis not present

## 2020-07-18 DIAGNOSIS — J301 Allergic rhinitis due to pollen: Secondary | ICD-10-CM | POA: Diagnosis not present

## 2020-07-18 DIAGNOSIS — J3089 Other allergic rhinitis: Secondary | ICD-10-CM | POA: Diagnosis not present

## 2020-07-25 DIAGNOSIS — J301 Allergic rhinitis due to pollen: Secondary | ICD-10-CM | POA: Diagnosis not present

## 2020-07-25 DIAGNOSIS — J3089 Other allergic rhinitis: Secondary | ICD-10-CM | POA: Diagnosis not present

## 2020-08-01 DIAGNOSIS — J301 Allergic rhinitis due to pollen: Secondary | ICD-10-CM | POA: Diagnosis not present

## 2020-08-01 DIAGNOSIS — J3089 Other allergic rhinitis: Secondary | ICD-10-CM | POA: Diagnosis not present

## 2020-08-02 ENCOUNTER — Other Ambulatory Visit: Payer: Self-pay

## 2020-08-02 ENCOUNTER — Ambulatory Visit (INDEPENDENT_AMBULATORY_CARE_PROVIDER_SITE_OTHER): Payer: PPO | Admitting: Internal Medicine

## 2020-08-02 DIAGNOSIS — R7303 Prediabetes: Secondary | ICD-10-CM | POA: Diagnosis not present

## 2020-08-02 DIAGNOSIS — R29898 Other symptoms and signs involving the musculoskeletal system: Secondary | ICD-10-CM

## 2020-08-02 DIAGNOSIS — R350 Frequency of micturition: Secondary | ICD-10-CM | POA: Diagnosis not present

## 2020-08-02 DIAGNOSIS — R4701 Aphasia: Secondary | ICD-10-CM

## 2020-08-02 DIAGNOSIS — N135 Crossing vessel and stricture of ureter without hydronephrosis: Secondary | ICD-10-CM | POA: Diagnosis not present

## 2020-08-02 DIAGNOSIS — D6869 Other thrombophilia: Secondary | ICD-10-CM | POA: Diagnosis not present

## 2020-08-02 LAB — COMPREHENSIVE METABOLIC PANEL
ALT: 25 U/L (ref 0–53)
AST: 24 U/L (ref 0–37)
Albumin: 3.8 g/dL (ref 3.5–5.2)
Alkaline Phosphatase: 55 U/L (ref 39–117)
BUN: 18 mg/dL (ref 6–23)
CO2: 31 mEq/L (ref 19–32)
Calcium: 9.2 mg/dL (ref 8.4–10.5)
Chloride: 102 mEq/L (ref 96–112)
Creatinine, Ser: 1.1 mg/dL (ref 0.40–1.50)
GFR: 60.12 mL/min (ref >60.00)
Glucose, Bld: 82 mg/dL (ref 70–99)
Potassium: 4 mEq/L (ref 3.5–5.1)
Sodium: 140 mEq/L (ref 135–145)
Total Bilirubin: 0.5 mg/dL (ref 0.2–1.2)
Total Protein: 6.6 g/dL (ref 6.0–8.3)

## 2020-08-02 LAB — HEMOGLOBIN A1C: Hgb A1c MFr Bld: 6.3 % (ref 4.6–6.5)

## 2020-08-02 NOTE — Patient Instructions (Addendum)
You need to start using a cane  If you wobble with the regular cane,  you need to get one with "feet" on it  Referral to the local urology group is in progress  Increase the citalopram to 20 mg daily as a trial to see if it helps with the irritability  You need to exercise your legs EVERY DAY and practice the balance exercises and the speech exercises daily; if you do not, you will continue to lose function

## 2020-08-02 NOTE — Progress Notes (Signed)
Subjective:  Patient ID: Todd Golden, male    DOB: March 20, 1930  Age: 87 y.o. MRN: [id number]  CC: The primary encounter diagnosis was Ureteral stricture, right. Diagnoses of Urinary frequency, Acquired thrombophilia ([place]), Prediabetes, Broca's aphasia, and Proximal leg weakness were also pertinent to this visit.  HPI Todd Golden presents for follow up  on chronic issues including hypertension, dementia, and hyperlipidemia  This visit occurred during the SARS-CoV-2 public health emergency.  Safety protocols were in place, including screening questions prior to the visit, additional usage of staff PPE, and extensive cleaning of exam room while observing appropriate contact time as indicated for disinfecting solutions.   Aricept 10 mg : dose increased on Jun 22 .   Citalopram started for irritability  about 3 weeks ago  Has fallen 7 times since last July (per wife) . Has completed  balance PT in the spring along with speech therapy , both were done at [facility] . Has not been doing the exercises prescribed by PT or ST.  Refuses to use a walker.  1) in the garden 2) kitchen 3) garage 4) [employer] sidewalk 5) shower 6) coming out of the shed   . Turning and stepping over levels.   Has a shower seat. 2 grab bars as well.   "Kidney flareups"  presents with right flank pain . History of ureteral stricture.  Needs referral.  Visual losses   Urinary frequency  chronic      Outpatient Medications Prior to Visit  Medication Sig Dispense Refill   donepezil (ARICEPT) 10 MG tablet Take by mouth.     amLODipine (NORVASC) 5 MG tablet Take 1 tablet (5 mg total) by mouth daily. 90 tablet 1   aspirin EC 81 MG tablet Take 81 mg by mouth daily. Swallow whole.     citalopram (CELEXA) 10 MG tablet Take 10 mg by mouth daily.     donepezil (ARICEPT) 5 MG tablet Take 5 mg by mouth at bedtime.     doxazosin (CARDURA) 4 MG tablet Take 1 tablet (4 mg total) by mouth daily. 90 tablet 0   EPINEPHrine 0.3  mg/0.3 mL IJ SOAJ injection Inject 0.3 mg into the muscle as needed for anaphylaxis.     hydrochlorothiazide (HYDRODIURIL) 25 MG tablet TAKE 1 TABLET(25 MG) BY MOUTH DAILY 90 tablet 1   latanoprost (XALATAN) 0.005 % ophthalmic solution Place 1 drop into both eyes at bedtime.     Multiple Vitamins-Minerals (PRESERVISION AREDS 2 PO) Take by mouth daily.     omeprazole (PRILOSEC) 40 MG capsule TAKE 1 CAPSULE(40 MG) BY MOUTH DAILY 90 capsule 3   pindolol (VISKEN) 10 MG tablet TAKE 1 TABLET(10 MG) BY MOUTH DAILY 90 tablet 0   simvastatin (ZOCOR) 20 MG tablet TAKE 1 TABLET BY MOUTH EVERY DAY 90 tablet 1   No facility-administered medications prior to visit.    Review of Systems;  Patient denies headache, fevers, malaise, unintentional weight loss, skin rash, eye pain, sinus congestion and sinus pain, sore throat, dysphagia,  hemoptysis , cough, dyspnea, wheezing, chest pain, palpitations, orthopnea, edema, abdominal pain, nausea, melena, diarrhea, constipation, flank pain, dysuria, hematuria, urinary  Frequency, nocturia, numbness, tingling, seizures,  Focal weakness, Loss of consciousness,  Tremor, insomnia, depression, anxiety, and suicidal ideation.      Objective:  There were no vitals taken for this visit.  BP Readings from Last 3 Encounters:  05/04/20 120/78  02/17/20 128/83  12/01/19 120/72    Wt Readings from Last  3 Encounters:  05/04/20 193 lb 8 oz (87.8 kg)  12/01/19 198 lb 12.8 oz (90.2 kg)  10/21/19 198 lb 2 oz (89.9 kg)    General appearance: alert, cooperative and appears stated age Ears: normal TM's and external ear canals both ears Throat: lips, mucosa, and tongue normal; teeth and gums normal Neck: no adenopathy, no carotid bruit, supple, symmetrical, trachea midline and thyroid not enlarged, symmetric, no tenderness/mass/nodules Back: symmetric, no curvature. ROM normal. No CVA tenderness. Lungs: clear to auscultation bilaterally Heart: regular rate and rhythm, S1,  S2 normal, no murmur, click, rub or gallop Abdomen: soft, non-tender; bowel sounds normal; no masses,  no organomegaly Pulses: 2+ and symmetric Skin: Skin color, texture, turgor normal. No rashes or lesions Lymph nodes: Cervical, supraclavicular, and axillary nodes normal.  Lab Results  Component Value Date   HGBA1C 6.3 08/02/2020   HGBA1C 6.3 12/01/2019   HGBA1C 6.0 05/27/2019    Lab Results  Component Value Date   CREATININE 1.10 08/02/2020   CREATININE 1.11 12/01/2019   CREATININE 1.12 05/27/2019    Lab Results  Component Value Date   WBC 5.5 12/01/2019   HGB 13.8 12/01/2019   HCT 41.6 12/01/2019   PLT 228.0 12/01/2019   GLUCOSE 82 08/02/2020   CHOL 131 12/01/2019   TRIG 89.0 12/01/2019   HDL 50.90 12/01/2019   LDLCALC 62 12/01/2019   ALT 25 08/02/2020   AST 24 08/02/2020   NA 140 08/02/2020   K 4.0 08/02/2020   CL 102 08/02/2020   CREATININE 1.10 08/02/2020   BUN 18 08/02/2020   CO2 31 08/02/2020   TSH 1.49 12/01/2019   PSA 2.97 10/21/2012   HGBA1C 6.3 08/02/2020    MR BRAIN WO CONTRAST  Result Date: 03/19/2020 CLINICAL DATA:  Gait disturbance.  Speech disturbance.  Memory loss. EXAM: MRI HEAD WITHOUT CONTRAST TECHNIQUE: Multiplanar, multiecho pulse sequences of the brain and surrounding structures were obtained without intravenous contrast. COMPARISON:  Head CT 12/09/2019.  MRI 12/03/2013. FINDINGS: Brain: Diffusion imaging does not show any acute or subacute infarction. Minimal small vessel change affects pons. There are a few old small vessel cerebellar infarctions. Cerebral hemispheres show advanced chronic small-vessel ischemic changes throughout the cerebral hemispheric white matter. There are old small vessel infarctions of the thalami and basal ganglia and there is an old left frontal operculum cortical and subcortical infarction. No mass, acute hemorrhage, hydrocephalus or extra-axial collection. Few foci of hemosiderin deposition associated with some of the  old small vessel infarctions. Vascular: Major vessels at the base of the brain show flow. Skull and upper cervical spine: Negative Sinuses/Orbits: Few sinus retention cysts. Some fluid in the right lateral division of the sphenoid sinus. Orbits are negative. Previous lens implants. Other: Chronic polyp of the left lateral nasal passages, which was also present in 2015 in therefore is not likely to be malignant or significant. IMPRESSION: No acute intracranial finding. Advanced chronic atrophy and small vessel ischemic changes throughout the brain. 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 Electronically Signed   By: Nelson Chimes M.D.   On: 03/19/2020 22:52    Assessment & Plan:   Problem List Items Addressed This Visit       Unprioritized   Acquired thrombophilia ([place])   Broca's aphasia    He has had increased difficulty with  His speech over the last several weeks        Prediabetes   Relevant Orders   Comprehensive metabolic panel (Completed)   Hemoglobin A1c (Completed)  Proximal leg weakness    Likely related to prior stroke  And lack of continued therapy resulting in deconditioning        Ureteral stricture, right - Primary    With chronic right hydronephrosis . Intermittent episodes of flank pain described,  Aggravated by large meals,  Relieved by drinking cranberry juice and relaxing.  Referral to Urology        Relevant Orders   Ambulatory referral to Urology   Other Visit Diagnoses     Urinary frequency       Relevant Orders   Urinalysis, Routine w reflex microscopic (Completed)   Urine Culture (Completed)     A total of 30 minutes was spent with patient more than half of which was spent in counseling patient on the above mentioned issues , reviewing and explaining recent labs and imaging studies done, and coordination of care.   I am having [name] maintain his latanoprost, Multiple Vitamins-Minerals (PRESERVISION AREDS 2 PO), EPINEPHrine, omeprazole,  doxazosin, aspirin EC, donepezil, pindolol, amLODipine, hydrochlorothiazide, simvastatin, citalopram, and donepezil.  No orders of the defined types were placed in this encounter.   There are no discontinued medications.  Follow-up: Return in about 3 months (around 11/02/2020).   Crecencio Mc, MD

## 2020-08-03 DIAGNOSIS — R29898 Other symptoms and signs involving the musculoskeletal system: Secondary | ICD-10-CM | POA: Insufficient documentation

## 2020-08-03 LAB — URINALYSIS, ROUTINE W REFLEX MICROSCOPIC
Bilirubin Urine: NEGATIVE
Hgb urine dipstick: NEGATIVE
Ketones, ur: NEGATIVE
Leukocytes,Ua: NEGATIVE
Nitrite: NEGATIVE
RBC / HPF: NONE SEEN (ref 0–?)
Specific Gravity, Urine: 1.02 (ref 1.000–1.030)
Urine Glucose: NEGATIVE
Urobilinogen, UA: 0.2 (ref 0.0–1.0)
pH: 6 (ref 5.0–8.0)

## 2020-08-03 LAB — URINE CULTURE
MICRO NUMBER:: 12114419
Result:: NO GROWTH
SPECIMEN QUALITY:: ADEQUATE

## 2020-08-03 NOTE — Assessment & Plan Note (Signed)
With chronic right hydronephrosis . Intermittent episodes of flank pain described,  Aggravated by large meals,  Relieved by drinking cranberry juice and relaxing.  Referral to Urology

## 2020-08-03 NOTE — Assessment & Plan Note (Signed)
He has had increased difficulty with  His speech over the last several weeks

## 2020-08-03 NOTE — Assessment & Plan Note (Addendum)
Likely related to prior stroke  And lack of continued therapy resulting in deconditioning

## 2020-08-08 DIAGNOSIS — J301 Allergic rhinitis due to pollen: Secondary | ICD-10-CM | POA: Diagnosis not present

## 2020-08-08 DIAGNOSIS — J3089 Other allergic rhinitis: Secondary | ICD-10-CM | POA: Diagnosis not present

## 2020-08-15 DIAGNOSIS — J3089 Other allergic rhinitis: Secondary | ICD-10-CM | POA: Diagnosis not present

## 2020-08-15 DIAGNOSIS — J301 Allergic rhinitis due to pollen: Secondary | ICD-10-CM | POA: Diagnosis not present

## 2020-08-16 ENCOUNTER — Other Ambulatory Visit: Payer: Self-pay

## 2020-08-16 ENCOUNTER — Encounter: Payer: Self-pay | Admitting: Urology

## 2020-08-16 ENCOUNTER — Ambulatory Visit (INDEPENDENT_AMBULATORY_CARE_PROVIDER_SITE_OTHER): Payer: PPO | Admitting: Urology

## 2020-08-16 VITALS — BP 128/77 | HR 98 | Ht 70.0 in | Wt 195.0 lb

## 2020-08-16 DIAGNOSIS — J309 Allergic rhinitis, unspecified: Secondary | ICD-10-CM | POA: Insufficient documentation

## 2020-08-16 DIAGNOSIS — N135 Crossing vessel and stricture of ureter without hydronephrosis: Secondary | ICD-10-CM

## 2020-08-16 DIAGNOSIS — J301 Allergic rhinitis due to pollen: Secondary | ICD-10-CM | POA: Insufficient documentation

## 2020-08-16 DIAGNOSIS — N3281 Overactive bladder: Secondary | ICD-10-CM | POA: Diagnosis not present

## 2020-08-16 DIAGNOSIS — J3081 Allergic rhinitis due to animal (cat) (dog) hair and dander: Secondary | ICD-10-CM | POA: Insufficient documentation

## 2020-08-16 LAB — BLADDER SCAN AMB NON-IMAGING

## 2020-08-16 MED ORDER — MIRABEGRON ER 25 MG PO TB24: 25.0000 mg | ORAL_TABLET | Freq: Every day | ORAL | 0 refills | Status: DC

## 2020-08-16 NOTE — Patient Instructions (Signed)

## 2020-08-16 NOTE — Progress Notes (Signed)
   08/16/2020 10:37 AM   [name] 26-Dec-1930 [id number]  Reason for visit: Follow up chronic right UPJ obstruction, overactive bladder  HPI: Frail 87 year old male with history of stroke who I last saw in November 2019 for a chronic right UPJ obstruction.  He has no images that I am able to review, but he previously was followed by multiple urologist and this was under observation.  He previously did not improve with a ureteral stent reportedly, and renal function has been normal and stable and unchanged with a creatinine of 1, EGFR greater than 60.  He does occasionally have some flares of flank pain if he drinks a lot of fluid or tea which would be consistent with a Dietl's crisis.  He was evaluated recently by PCP with a normal urinalysis and normal urine culture.  He also reports some worsening urinary symptoms with urgency, frequency, nocturia.  He was started on oxybutynin by his PCP, and he feels this may have helped mildly.  Unfortunately he is also had a fair amount of falls over the last month and his overall health has declined.  Regarding his chronic right UPJ obstruction, his renal function is stable, and it does not sound like he has had significant changes in his occasional right-sided flank pain.  I recommended avoiding diuretics and tea, as well as large volumes of fluids.  I do not think he would benefit from a ureteral stent or any complex reconstruction with his comorbidities and frailty, and overall stability of his UPJ obstruction over the last few months.  We discussed alternatives like a renal ultrasound or CT for better evaluation of his anatomy, but I think this is a low likelihood of changing our management and he would like to hold off at this time which is reasonable.  In terms of his urinary symptoms, I counseled him to stop the oxybutynin, as this certainly would be high risk for confusion or falls with his age and frailty. PVR is normal at 100 mL today, and I  think this is overactive bladder related to his dementia/stroke and age.  I provided samples of Myrbetriq 25 mg daily, and we discussed avoiding bladder irritants at length.  Trial of Myrbetriq for OAB, behavioral strategies discussed RTC 1 month symptom check, PVR, consider further imaging if worsening flank pain   Billey Co, MD  [facility] [address] [place], [place] [address] [phone]

## 2020-08-17 DIAGNOSIS — H353211 Exudative age-related macular degeneration, right eye, with active choroidal neovascularization: Secondary | ICD-10-CM | POA: Diagnosis not present

## 2020-08-17 DIAGNOSIS — H353131 Nonexudative age-related macular degeneration, bilateral, early dry stage: Secondary | ICD-10-CM | POA: Diagnosis not present

## 2020-08-22 DIAGNOSIS — J3089 Other allergic rhinitis: Secondary | ICD-10-CM | POA: Diagnosis not present

## 2020-08-22 DIAGNOSIS — J301 Allergic rhinitis due to pollen: Secondary | ICD-10-CM | POA: Diagnosis not present

## 2020-08-27 ENCOUNTER — Other Ambulatory Visit: Payer: Self-pay | Admitting: Internal Medicine

## 2020-08-29 DIAGNOSIS — J3089 Other allergic rhinitis: Secondary | ICD-10-CM | POA: Diagnosis not present

## 2020-08-29 DIAGNOSIS — J301 Allergic rhinitis due to pollen: Secondary | ICD-10-CM | POA: Diagnosis not present

## 2020-09-05 DIAGNOSIS — J3089 Other allergic rhinitis: Secondary | ICD-10-CM | POA: Diagnosis not present

## 2020-09-05 DIAGNOSIS — J301 Allergic rhinitis due to pollen: Secondary | ICD-10-CM | POA: Diagnosis not present

## 2020-09-14 ENCOUNTER — Ambulatory Visit: Payer: PPO | Admitting: Urology

## 2020-09-14 ENCOUNTER — Other Ambulatory Visit: Payer: Self-pay

## 2020-09-14 ENCOUNTER — Encounter: Payer: Self-pay | Admitting: Urology

## 2020-09-14 VITALS — BP 121/73 | HR 90 | Ht 70.0 in | Wt 190.0 lb

## 2020-09-14 DIAGNOSIS — N138 Other obstructive and reflux uropathy: Secondary | ICD-10-CM | POA: Diagnosis not present

## 2020-09-14 DIAGNOSIS — N135 Crossing vessel and stricture of ureter without hydronephrosis: Secondary | ICD-10-CM | POA: Diagnosis not present

## 2020-09-14 DIAGNOSIS — N3281 Overactive bladder: Secondary | ICD-10-CM

## 2020-09-14 DIAGNOSIS — N401 Enlarged prostate with lower urinary tract symptoms: Secondary | ICD-10-CM | POA: Diagnosis not present

## 2020-09-14 LAB — BLADDER SCAN AMB NON-IMAGING

## 2020-09-14 NOTE — Patient Instructions (Signed)
Minimize fluids after 5 PM, and try to urinate twice within 20 to 30 minutes of going to bed.  Cut back on tea or any diet drinks as these can irritate the bladder and cause urinary frequency and getting up multiple times overnight

## 2020-09-14 NOTE — Progress Notes (Signed)
   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  He has been followed by multiple urologist in the past, and did not improve with a ureteral stent reportedly, and renal function has been normal and stable and unchanged with a creatinine of 1, EGFR greater than 60.  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  Urinalysis and culture were recently normal.  We previously had discussed continuing observation versus further imaging with ultrasound or CT, and he opted for observation.  He has not had any further episodes of flank pain, and is in agreement with just observation moving forward.  He was having some urinary symptoms of urgency, frequency, nocturia and was started by oxybutynin by his PCP.  This unfortunately caused a fair number of falls, and I stopped that medication and started Myrbetriq 25 mg daily.  He reports no significant change in the urination on the Myrbetriq.  It sounds like his primary complaint is nocturia, and he does relatively well during the day.  He reports the urine is " hot" but I have a tough time getting more of a history, and it does not sound that he is having any dysuria.  His wife contributes to much of the history as he is quite frail and having word finding difficulty.  Urine culture 08/02/2020 showed no growth.  Bladder scan to 288 mL, but patient does not have the urge to void yet.  Per my last note he was having some urge incontinence, but he denies any at this time-unclear if he had improvement on the Myrbetriq or history limited.  I discussed with the patient and his wife that I think we need to have realistic expectations regarding his urinary symptoms.  I recommended  cutting back on tea and bladder irritants like alcohol, and minimizing fluids in the evening before bed.  He is already on an alpha-blocker with doxazosin.  If any episodes of retention, UTI, or worsening urinary symptoms could consider cystoscopy in the future.  RTC 6 months PVR   B[place]ley Co, [place] [address] [place], [place] [address] [phone]

## 2020-09-19 DIAGNOSIS — J3089 Other allergic rhinitis: Secondary | ICD-10-CM | POA: Diagnosis not present

## 2020-09-19 DIAGNOSIS — J301 Allergic rhinitis due to pollen: Secondary | ICD-10-CM | POA: Diagnosis not present

## 2020-09-26 ENCOUNTER — Ambulatory Visit: Payer: PPO

## 2020-09-26 DIAGNOSIS — J3089 Other allergic rhinitis: Secondary | ICD-10-CM | POA: Diagnosis not present

## 2020-09-26 DIAGNOSIS — J301 Allergic rhinitis due to pollen: Secondary | ICD-10-CM | POA: Diagnosis not present

## 2020-10-03 DIAGNOSIS — J3089 Other allergic rhinitis: Secondary | ICD-10-CM | POA: Diagnosis not present

## 2020-10-03 DIAGNOSIS — J301 Allergic rhinitis due to pollen: Secondary | ICD-10-CM | POA: Diagnosis not present

## 2020-10-05 DIAGNOSIS — H353221 Exudative age-related macular degeneration, left eye, with active choroidal neovascularization: Secondary | ICD-10-CM | POA: Diagnosis not present

## 2020-10-05 DIAGNOSIS — H353211 Exudative age-related macular degeneration, right eye, with active choroidal neovascularization: Secondary | ICD-10-CM | POA: Diagnosis not present

## 2020-10-10 DIAGNOSIS — J3089 Other allergic rhinitis: Secondary | ICD-10-CM | POA: Diagnosis not present

## 2020-10-10 DIAGNOSIS — J301 Allergic rhinitis due to pollen: Secondary | ICD-10-CM | POA: Diagnosis not present

## 2020-10-11 ENCOUNTER — Other Ambulatory Visit: Payer: Self-pay | Admitting: Internal Medicine

## 2020-10-17 DIAGNOSIS — J3089 Other allergic rhinitis: Secondary | ICD-10-CM | POA: Diagnosis not present

## 2020-10-17 DIAGNOSIS — J301 Allergic rhinitis due to pollen: Secondary | ICD-10-CM | POA: Diagnosis not present

## 2020-10-24 DIAGNOSIS — J3089 Other allergic rhinitis: Secondary | ICD-10-CM | POA: Diagnosis not present

## 2020-10-24 DIAGNOSIS — J301 Allergic rhinitis due to pollen: Secondary | ICD-10-CM | POA: Diagnosis not present

## 2020-10-31 DIAGNOSIS — J3089 Other allergic rhinitis: Secondary | ICD-10-CM | POA: Diagnosis not present

## 2020-10-31 DIAGNOSIS — J301 Allergic rhinitis due to pollen: Secondary | ICD-10-CM | POA: Diagnosis not present

## 2020-11-02 ENCOUNTER — Ambulatory Visit (INDEPENDENT_AMBULATORY_CARE_PROVIDER_SITE_OTHER): Payer: PPO | Admitting: Internal Medicine

## 2020-11-02 ENCOUNTER — Other Ambulatory Visit: Payer: Self-pay

## 2020-11-02 ENCOUNTER — Encounter: Payer: Self-pay | Admitting: Internal Medicine

## 2020-11-02 VITALS — BP 138/88 | HR 91 | Temp 96.4°F | Ht 70.0 in | Wt 190.4 lb

## 2020-11-02 DIAGNOSIS — R2689 Other abnormalities of gait and mobility: Secondary | ICD-10-CM | POA: Diagnosis not present

## 2020-11-02 DIAGNOSIS — Z23 Encounter for immunization: Secondary | ICD-10-CM | POA: Diagnosis not present

## 2020-11-02 DIAGNOSIS — D6869 Other thrombophilia: Secondary | ICD-10-CM | POA: Diagnosis not present

## 2020-11-02 DIAGNOSIS — G319 Degenerative disease of nervous system, unspecified: Secondary | ICD-10-CM

## 2020-11-02 DIAGNOSIS — R351 Nocturia: Secondary | ICD-10-CM

## 2020-11-02 DIAGNOSIS — I480 Paroxysmal atrial fibrillation: Secondary | ICD-10-CM

## 2020-11-02 DIAGNOSIS — R296 Repeated falls: Secondary | ICD-10-CM

## 2020-11-02 DIAGNOSIS — N401 Enlarged prostate with lower urinary tract symptoms: Secondary | ICD-10-CM | POA: Diagnosis not present

## 2020-11-02 DIAGNOSIS — R4701 Aphasia: Secondary | ICD-10-CM | POA: Diagnosis not present

## 2020-11-02 MED ORDER — CITALOPRAM HYDROBROMIDE 10 MG PO TABS: 10.0000 mg | ORAL_TABLET | Freq: Every day | ORAL | 1 refills | Status: DC

## 2020-11-02 NOTE — Progress Notes (Signed)
Subjective:  Patient ID: Todd Golden, male    DOB: 1930/07/23  Age: 87 y.o. MRN: [id number]  CC: The primary encounter diagnosis was Need for immunization against influenza. Diagnoses of BPH associated with nocturia, Diffuse brain atrophy ([place]), PAF (paroxysmal atrial fibrillation) ([place]), Expressive aphasia, Frequent falls, Acquired thrombophilia ([place]), and Balance problem were also pertinent to this visit.  HPI Todd Golden presents for follow up on multiple issues  Chief Complaint  Patient presents with   Follow-up    3 month follow up    This visit occurred during the SARS-CoV-2 public health emergency.  Safety protocols were in place, including screening questions prior to the visit, additional usage of staff PPE, and extensive cleaning of exam room while observing appropriate contact time as indicated for disinfecting solutions.   Todd Golden is an 87 yr old male with a history of Broca's aphasia secondary to stoke,  who has been having mild cognitive deficits , balance issues,  and increased aphasia for the last several months   At his last visit he was prescribed an antidepressant for management of irritability; however,  he has stopped citalopram because "I'm not irritable."  Aphasia : getting worse.  Dr Melrose Nakayama prescribed  aricept and citalopram (10 mg ,  again)  which made him dizzier so he stopped  taking it.  .    He has been advised to give up driving due to worsening vision .   He has had 4 falls since last visit.  All falls occurring in the house . Has both the 4 footed walker  and the rolling walker . Not using either walker  regularly,  mostly at night at home. Won't use a cane. Marland Kitchen "My legs are fine. I am tripping over things."   Seen by urology for ureteral stricture and OAB.  Oxybutynin changed to myrbetriq due to frequent falls. No improvement in nocturia.   Outpatient Medications Prior to Visit  Medication Sig Dispense Refill   amLODipine (NORVASC) 5 MG tablet  Take 1 tablet (5 mg total) by mouth daily. 90 tablet 1   aspirin EC 81 MG tablet Take 81 mg by mouth daily. Swallow whole.     donepezil (ARICEPT) 10 MG tablet Take 10 mg by mouth daily.     doxazosin (CARDURA) 4 MG tablet Take 1 tablet (4 mg total) by mouth daily. 90 tablet 0   EPINEPHrine 0.3 mg/0.3 mL IJ SOAJ injection Inject 0.3 mg into the muscle as needed for anaphylaxis.     hydrochlorothiazide (HYDRODIURIL) 25 MG tablet TAKE 1 TABLET(25 MG) BY MOUTH DAILY 90 tablet 1   latanoprost (XALATAN) 0.005 % ophthalmic solution Place 1 drop into both eyes at bedtime.     Multiple Vitamins-Minerals (PRESERVISION AREDS 2 PO) Take by mouth daily.     omeprazole (PRILOSEC) 40 MG capsule TAKE 1 CAPSULE(40 MG) BY MOUTH DAILY 90 capsule 3   pindolol (VISKEN) 10 MG tablet TAKE 1 TABLET(10 MG) BY MOUTH DAILY 90 tablet 0   simvastatin (ZOCOR) 20 MG tablet TAKE 1 TABLET BY MOUTH EVERY DAY 90 tablet 1   citalopram (CELEXA) 10 MG tablet Take 10 mg by mouth daily. (Patient not taking: Reported on 11/02/2020)     donepezil (ARICEPT) 5 MG tablet Take 5 mg by mouth at bedtime. (Patient not taking: Reported on 11/02/2020)     No facility-administered medications prior to visit.    Review of Systems;  Patient denies headache, fevers, malaise, unintentional weight loss, skin rash, eye  pain, sinus congestion and sinus pain, sore throat, dysphagia,  hemoptysis , cough, dyspnea, wheezing, chest pain, palpitations, orthopnea, edema, abdominal pain, nausea, melena, diarrhea, constipation, flank pain, dysuria, hematuria, urinary  Frequency, nocturia, numbness, tingling, seizures,  Focal weakness, Loss of consciousness,  Tremor, insomnia, depression, anxiety, and suicidal ideation.      Objective:  BP 138/88 (BP Location: Left Arm, Patient Position: Sitting, Cuff Size: Normal)   Pulse 91   Temp (!) 96.4 F (35.8 C) (Temporal)   Ht 5\' 10"  (1.778 m)   Wt 190 lb 6.4 oz (86.4 kg)   SpO2 98%   BMI 27.32 kg/m   BP  Readings from Last 3 Encounters:  11/02/20 138/88  09/14/20 121/73  08/16/20 128/77    Wt Readings from Last 3 Encounters:  11/02/20 190 lb 6.4 oz (86.4 kg)  09/14/20 190 lb (86.2 kg)  08/16/20 195 lb (88.5 kg)    General appearance: alert, cooperative and appears stated age Ears: normal TM's and external ear canals both ears Throat: lips, mucosa, and tongue normal; teeth and gums normal Neck: no adenopathy, no carotid bruit, supple, symmetrical, trachea midline and thyroid not enlarged, symmetric, no tenderness/mass/nodules Back: symmetric, no curvature. ROM normal. No CVA tenderness. Lungs: clear to auscultation bilaterally Heart: regular rate and rhythm, S1, S2 normal, no murmur, click, rub or gallop Abdomen: soft, non-tender; bowel sounds normal; no masses,  no organomegaly Pulses: 2+ and symmetric Skin: Skin color, texture, turgor normal. No rashes or lesions Lymph nodes: Cervical, supraclavicular, and axillary nodes normal.  Lab Results  Component Value Date   HGBA1C 6.3 08/02/2020   HGBA1C 6.3 12/01/2019   HGBA1C 6.0 05/27/2019    Lab Results  Component Value Date   CREATININE 1.10 08/02/2020   CREATININE 1.11 12/01/2019   CREATININE 1.12 05/27/2019    Lab Results  Component Value Date   WBC 5.5 12/01/2019   HGB 13.8 12/01/2019   HCT 41.6 12/01/2019   PLT 228.0 12/01/2019   GLUCOSE 82 08/02/2020   CHOL 131 12/01/2019   TRIG 89.0 12/01/2019   HDL 50.90 12/01/2019   LDLCALC 62 12/01/2019   ALT 25 08/02/2020   AST 24 08/02/2020   NA 140 08/02/2020   K 4.0 08/02/2020   CL 102 08/02/2020   CREATININE 1.10 08/02/2020   BUN 18 08/02/2020   CO2 31 08/02/2020   TSH 1.49 12/01/2019   PSA 2.97 10/21/2012   HGBA1C 6.3 08/02/2020    MR BRAIN WO CONTRAST  Result Date: 03/19/2020 CLINICAL DATA:  Gait disturbance.  Speech disturbance.  Memory loss. EXAM: MRI HEAD WITHOUT CONTRAST TECHNIQUE: Multiplanar, multiecho pulse sequences of the brain and surrounding  structures were obtained without intravenous contrast. COMPARISON:  Head CT 12/09/2019.  MRI 12/03/2013. FINDINGS: Brain: Diffusion imaging does not show any acute or subacute infarction. Minimal small vessel change affects pons. There are a few old small vessel cerebellar infarctions. Cerebral hemispheres show advanced chronic small-vessel ischemic changes throughout the cerebral hemispheric white matter. There are old small vessel infarctions of the thalami and basal ganglia and there is an old left frontal operculum cortical and subcortical infarction. No mass, acute hemorrhage, hydrocephalus or extra-axial collection. Few foci of hemosiderin deposition associated with some of the old small vessel infarctions. Vascular: Major vessels at the base of the brain show flow. Skull and upper cervical spine: Negative Sinuses/Orbits: Few sinus retention cysts. Some fluid in the right lateral division of the sphenoid sinus. Orbits are negative. Previous lens implants. 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 IMPRESSION: No acute intracranial finding. Advanced chronic atrophy and small vessel ischemic changes throughout the brain. 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 Electronically Signed   By: Nelson Chimes M.D.   On: 03/19/2020 22:52    Assessment & Plan:   Problem List Items Addressed This Visit       Unprioritized   Acquired thrombophilia ([place])    secondary to use of Eliquis for embolic stroke risk mitigation due to  atrial fibrillation. Eliquis has been stopped due to frequent falls       Diffuse brain atrophy ([place])   Relevant Orders   AMB Referral to [facility]   PAF (paroxysmal atrial fibrillation) ([place])   Relevant Orders   AMB Referral to [facility]   BPH associated with nocturia    myrbetriq started by Urology  Last month and oxybutynin stopped due to  persistnet falls.  howver he has been falling frequently for nearly a year  .  No change in freuquent nocturia with mybetrriq.  Continue alpha blocker.  Last PVR was 288 ml per urology       Balance problem    He has declined to use the walker on a consistent basis and has had several minor falls at home.advised to get a Museum/gallery conservator      Expressive aphasia   Relevant Orders   AMB Referral to [facility]   Other Visit Diagnoses     Need for immunization against influenza    -  Primary   Relevant Orders   Flu Vaccine QUAD High Dose(Fluad) (Completed)   Frequent falls       Relevant Orders   AMB Referral to [facility] ordered this encounter  Medications   citalopram (CELEXA) 10 MG tablet    Sig: Take 1 tablet (10 mg total) by mouth daily.    Dispense:  90 tablet    Refill:  1    Medications Discontinued During This Encounter  Medication Reason   donepezil (ARICEPT) 5 MG tablet    citalopram (CELEXA) 10 MG tablet Reorder    Follow-up: No follow-ups on file.   Crecencio Mc, MD

## 2020-11-02 NOTE — Assessment & Plan Note (Signed)
myrbetriq started by Urology  Last month and oxybutynin stopped due to persistnet falls.  howver he has been falling frequently for nearly a year  .  No change in freuquent nocturia with mybetrriq.  Continue alpha blocker.  Last PVR was 288 ml per urology

## 2020-11-02 NOTE — Patient Instructions (Signed)
I'm glad you have decided to give citalopram another try    I am letting you know that I am referring to our chronic care management team which includes a nurse case manager,  a clinical pharmacist, and a Education officer, museum. These women  can help  provide additional services to my patients who are on Medicare and dealing with  chronic diseases , like kidney disease, COPD and diabetes.  I do not expect this referral to cost you anything out of pocket, but I do think they  will be able to help you maximize your BENEFITS.  They  will make contact with  You by phone in the next week

## 2020-11-03 ENCOUNTER — Telehealth: Payer: Self-pay

## 2020-11-03 NOTE — Chronic Care Management (AMB) (Signed)
  Chronic Care Management   Note  11/03/2020 Name: KAYLUB DETIENNE MRN: [id number] DOB: [birth year]  MATTIA LIFORD is a 87 y.o. year old male who is a primary care patient of Derrel Nip, Aris Everts, MD. I reached out to [name] by phone today in response to a referral sent by Mr. Saamir Armstrong Nitsch's PCP.  Mr. Cortese was given information about Chronic Care Management services today including:  CCM service includes personalized support from designated clinical staff supervised by his physician, including individualized plan of care and coordination with other care providers 24/7 contact phone numbers for assistance for urgent and routine care needs. Service will only be billed when office clinical staff spend 20 minutes or more in a month to coordinate care. Only one practitioner may furnish and bill the service in a calendar month. The patient may stop CCM services at any time (effective at the end of the month) by phone call to the office staff. The patient is responsible for co-pay (up to 20% after annual deductible is met) if co-pay is required by the individual health plan.   Patient agreed to services and verbal consent obtained.   Follow up plan: Telephone appointment with care management team member scheduled for: RN CM 11/10/2020 LCSW 11/14/2020  Noreene Larsson, [place], [place],  [address] Direct Dial: [phone] [email or web address]

## 2020-11-04 NOTE — Assessment & Plan Note (Signed)
He has declined to use the walker on a consistent basis and has had several minor falls at home.advised to get a Financial controller or [employer]

## 2020-11-04 NOTE — Assessment & Plan Note (Addendum)
secondary to use of Eliquis for embolic stroke risk mitigation due to  atrial fibrillation. Eliquis has been stopped due to frequent falls

## 2020-11-07 DIAGNOSIS — J301 Allergic rhinitis due to pollen: Secondary | ICD-10-CM | POA: Diagnosis not present

## 2020-11-07 DIAGNOSIS — J3089 Other allergic rhinitis: Secondary | ICD-10-CM | POA: Diagnosis not present

## 2020-11-10 ENCOUNTER — Ambulatory Visit: Payer: PPO | Admitting: *Deleted

## 2020-11-10 DIAGNOSIS — I480 Paroxysmal atrial fibrillation: Secondary | ICD-10-CM

## 2020-11-10 DIAGNOSIS — I10 Essential (primary) hypertension: Secondary | ICD-10-CM

## 2020-11-10 NOTE — Chronic Care Management (AMB) (Signed)
  Care Management   Follow Up Note   11/10/2020 Name: Todd Golden MRN: [id number] DOB: 1930/03/28   Referred by: Crecencio Mc, MD Reason for referral : Chronic Care Management (Initial)   Successful telephone outreach to for introduction and initial.  RNCM introduced self and role.  Wife requested to call RNCM back after reviewing her personal notes.  RNCM out reached multiple more times without success. RNCM will seek assistance from Care Guides in rescheduling appointment within the next 30 days  Follow Up Plan: RNCM will seek assistance from Care Guides in rescheduling appointment within the next 30 days  [name] RN, MSN RN Care Management Coordinator [place] [phone] [email or web address]

## 2020-11-14 ENCOUNTER — Ambulatory Visit (INDEPENDENT_AMBULATORY_CARE_PROVIDER_SITE_OTHER): Payer: PPO | Admitting: *Deleted

## 2020-11-14 DIAGNOSIS — G319 Degenerative disease of nervous system, unspecified: Secondary | ICD-10-CM

## 2020-11-14 DIAGNOSIS — R296 Repeated falls: Secondary | ICD-10-CM

## 2020-11-14 DIAGNOSIS — Z8673 Personal history of transient ischemic attack (TIA), and cerebral infarction without residual deficits: Secondary | ICD-10-CM

## 2020-11-14 DIAGNOSIS — R531 Weakness: Secondary | ICD-10-CM

## 2020-11-14 DIAGNOSIS — J301 Allergic rhinitis due to pollen: Secondary | ICD-10-CM | POA: Diagnosis not present

## 2020-11-14 DIAGNOSIS — J3089 Other allergic rhinitis: Secondary | ICD-10-CM | POA: Diagnosis not present

## 2020-11-14 DIAGNOSIS — I480 Paroxysmal atrial fibrillation: Secondary | ICD-10-CM

## 2020-11-14 DIAGNOSIS — R2689 Other abnormalities of gait and mobility: Secondary | ICD-10-CM

## 2020-11-14 DIAGNOSIS — I10 Essential (primary) hypertension: Secondary | ICD-10-CM

## 2020-11-14 DIAGNOSIS — R4701 Aphasia: Secondary | ICD-10-CM

## 2020-11-14 DIAGNOSIS — H3530 Unspecified macular degeneration: Secondary | ICD-10-CM

## 2020-11-14 NOTE — Patient Instructions (Signed)
Visit Information   PATIENT GOALS:   Goals Addressed             This Visit's Progress    Prevent Falls and Injury.   On track    Follow-Up Date:  11/28/2020 at 10:30am   Patient Goals/Self Care Activities: Continue with compliance of taking medications exactly as prescribed.  Over the next two weeks, spend time talking with wife about what services and/or resources may be beneficial to you in the home. Contact LCSW directly (# [id number]) if you have questions, need assistance, or if social work needs are identified between now and our next scheduled telephone outreach call. Remember To: Always use handrails on the stairs. Install bathroom grab bars. Use a nonslip pad with throw rugs, or remove them completely. Use a cane or walker. Use a nightlight in the bathroom and bedroom. Wear my glasses and hearing aids.           Consent to CCM Services: Todd Golden was given information about Chronic Care Management services including:  CCM service includes personalized support from designated clinical staff supervised by his physician, including individualized plan of care and coordination with other care providers 24/7 contact phone numbers for assistance for urgent and routine care needs. Service will only be billed when office clinical staff spend 20 minutes or more in a month to coordinate care. Only one practitioner may furnish and bill the service in a calendar month. The patient may stop CCM services at any time (effective at the end of the month) by phone call to the office staff. The patient will be responsible for cost sharing (co-pay) of up to 20% of the service fee (after annual deductible is met).  Patient agreed to services and verbal consent obtained.   Patient verbalizes understanding of instructions provided today and agrees to view in [place].   Telephone follow up appointment with care management team member scheduled for:  11/28/2020 at 10:30am  [place] Licensed Clinical Social Worker [place]  [phone]   CLINICAL CARE PLAN: Patient Care Plan: LCSW Plan of Care     Problem Identified: Prevent Falls and Injury.   Priority: High     Goal: Absence of Fall and Fall-Related Injury.   Start Date: 11/14/2020  Expected End Date: 01/15/2021  This Visit's Progress: On track  Priority: High  Note:   Current Barriers:    Level of Care Concerns, ADL/IADL Limitations, Limited Access to Caregiver, and Lacks Knowledge of [employer]. Clinical Goals:  Patient and wife will work with LCSW to identify services and resources that may be beneficial to them in the home. Clinical Interventions:  Assessment of needs, barriers, agencies contacted, as well as how impacting.  Solution-Focused Strategies employed. Active Listening/Reflection utilized. Problem Solving/Task-Centered Strategies reviewed. Quality of Sleep Assessed and Sleep Hygiene Techniques promoted.  Caregiver Stress acknowledged and Participation in Caregiver Support Group encouraged. Consideration of In-Home Help discussed.  Increase in Activities/Exercise emphasized. Verbalization of Feelings encouraged. Reviewed various resources, discussed options and provided patient's wife with the following information: Adult Day Care Services - PACE ([facility]) and ACE ([place] [facility]). In-Home Help - PCS (Personal Care Services), [facility]S Forensic scientist), In-Home Aide Services Program, [place] and  Attendance Benefits and Private Pay.   Prepared Meal Delivery Services - Meals-on-Wheels and Mom's Meals. Radio producer, Nurse, learning disability, Social research officer, government. Collaboration with Primary Care Provider, Dr. Deborra Medina regarding development and update  of comprehensive plan of care as evidenced by provider attestation and co-signature. Level of care options discussed at  length. Inter-disciplinary care team collaboration (see longitudinal plan of care). Patient Goals/Self-Care Activities: Continue with compliance of taking medications exactly as prescribed.  Over the next two weeks, spend time talking with wife about what services and/or resources may be beneficial to you in the home. Contact LCSW directly (# [id number]) if you have questions, need assistance, or if social work needs are identified between now and our next scheduled telephone outreach call. Follow-Up Date:  11/28/2020 at 10:30am

## 2020-11-14 NOTE — Chronic Care Management (AMB) (Signed)
Chronic Care Management    Clinical Social Work Note  11/14/2020 Name: Todd Golden MRN: [id number] DOB: [birth year]  Todd Golden is a 87 y.o. year old male who is a primary care patient of Todd Golden, Todd Everts, MD. The CCM team was consulted to assist the patient with chronic disease management and/or care coordination needs related to: [employer] and Level of Care Concerns.   Engaged with patient's wife by telephone for initial visit in response to provider referral for social work chronic care management and care coordination services.   Consent to Services:  The patient was given information about Chronic Care Management services, agreed to services, and gave verbal consent prior to initiation of services.  Please see initial visit note for detailed documentation.   Patient agreed to services and consent obtained.   Assessment: Review of patient past medical history, allergies, medications, and health status, including review of relevant consultants reports was performed today as part of a comprehensive evaluation and provision of chronic care management and care coordination services.     SDOH (Social Determinants of Health) assessments and interventions performed:  SDOH Interventions    Flowsheet Row Most Recent Value  SDOH Interventions   Food Insecurity Interventions Intervention Not Indicated, Other (Comment)  [Verified by Wife - [name]]  Financial Strain Interventions Intervention Not Indicated, Other (Comment)  [Verified by Wife - [name]]  Housing Interventions Intervention Not Indicated, Other (Comment)  [Verified by Wife - [name]]  Intimate Partner Violence Interventions Intervention Not Indicated, Other (Comment)  [Verified by Wife - [name]]  Physical Activity Interventions Intervention Not Indicated, Other (Comments)  [Verified by Wife - [name]]  Stress Interventions Intervention Not Indicated, Other (Comment)  [Verified by  Wife - [name]]  Social Connections Interventions Intervention Not Indicated, Other (Comment)  [Verified by Wife - [name]]  Transportation Interventions Intervention Not Indicated, Other (Comment)  [Verified by Wife - [name]]        Advanced Directives Status: See Care Plan for related entries.  CCM Care Plan  No Known Allergies  Outpatient Encounter Medications as of 11/14/2020  Medication Sig   amLODipine (NORVASC) 5 MG tablet Take 1 tablet (5 mg total) by mouth daily.   aspirin EC 81 MG tablet Take 81 mg by mouth daily. Swallow whole.   citalopram (CELEXA) 10 MG tablet Take 1 tablet (10 mg total) by mouth daily.   donepezil (ARICEPT) 10 MG tablet Take 10 mg by mouth daily.   doxazosin (CARDURA) 4 MG tablet Take 1 tablet (4 mg total) by mouth daily.   EPINEPHrine 0.3 mg/0.3 mL IJ SOAJ injection Inject 0.3 mg into the muscle as needed for anaphylaxis.   hydrochlorothiazide (HYDRODIURIL) 25 MG tablet TAKE 1 TABLET(25 MG) BY MOUTH DAILY   latanoprost (XALATAN) 0.005 % ophthalmic solution Place 1 drop into both eyes at bedtime.   Multiple Vitamins-Minerals (PRESERVISION AREDS 2 PO) Take by mouth daily.   omeprazole (PRILOSEC) 40 MG capsule TAKE 1 CAPSULE(40 MG) BY MOUTH DAILY   pindolol (VISKEN) 10 MG tablet TAKE 1 TABLET(10 MG) BY MOUTH DAILY   simvastatin (ZOCOR) 20 MG tablet TAKE 1 TABLET BY MOUTH EVERY DAY   No facility-administered encounter medications on file as of 11/14/2020.    Patient Active Problem List   Diagnosis Date Noted   Allergic rhinitis 08/16/2020   Allergic rhinitis due to animal (cat) (dog) hair and dander 08/16/2020   Allergic rhinitis due to pollen 08/16/2020   Proximal leg  weakness 08/03/2020   Hx of completed stroke 02/02/2020   Weakness 02/02/2020   Diffuse brain atrophy ([place]) 12/09/2019   Balance problem 12/03/2019   Acquired thrombophilia ([place]) 12/03/2019   Prediabetes 05/29/2019   Benign prostatic hyperplasia with urinary  frequency 08/20/2018   Hearing aid worn 08/16/2017   Expressive aphasia 08/16/2017   History of cataract surgery 08/27/2016   Age-related macular degeneration 08/27/2016   Counseling regarding end of life decision making 08/27/2016   Do not resuscitate status 08/27/2016   BPH associated with nocturia 02/11/2015   PAF (paroxysmal atrial fibrillation) ([place]) 12/14/2013   Hyperlipidemia LDL goal <100 12/12/2013   CVD (cerebrovascular disease) 12/10/2013   GERD (gastroesophageal reflux disease) 07/25/2013   Bilateral hand numbness 07/25/2013   Chronic constipation 01/21/2013   Encounter for preventive health examination 01/21/2013   Essential hypertension, benign 10/21/2012   Glaucoma 10/21/2012   History of colonic polyps 10/21/2012   Ureteral stricture, right 10/21/2012    Conditions to be addressed/monitored: HTN.  Limited Social Support, Level of Care Concerns, ADL/IADL Limitations, Limited Access to Caregiver, and Lacks Knowledge of [employer].  Care Plan : LCSW Plan of Care  Updates made by Francis Gaines, LCSW since 11/14/2020 12:00 AM     Problem: Prevent Falls and Injury.   Priority: High     Goal: Absence of Fall and Fall-Related Injury.   Start Date: 11/14/2020  Expected End Date: 01/15/2021  This Visit's Progress: On track  Priority: High  Note:   Current Barriers:    Level of Care Concerns, ADL/IADL Limitations, Limited Access to Caregiver, and Lacks Knowledge of [employer]. Clinical Goals:  Patient and wife will work with LCSW to identify services and resources that may be beneficial to them in the home. Clinical Interventions:  Assessment of needs, barriers, agencies contacted, as well as how impacting.  Solution-Focused Strategies employed. Active Listening/Reflection utilized. Problem Solving/Task-Centered Strategies reviewed. Quality of Sleep Assessed and Sleep Hygiene Techniques promoted.  Caregiver Stress acknowledged and  Participation in Caregiver Support Group encouraged. Consideration of In-Home Help discussed.  Increase in Activities/Exercise emphasized. Verbalization of Feelings encouraged. Reviewed various resources, discussed options and provided patient's wife with the following information: Adult Day Care Services - PACE (Program of [place] for the Elderly) and ACE ([place] for Enrichment). In-Home Help - PCS (Personal Care Services), CAPS Forensic scientist), In-Home Aide Services Program, [place] and  Attendance Benefits and Private Pay.   Prepared Meal Delivery Services - Meals-on-Wheels and Mom's Meals. Radio producer, Nurse, learning disability, Social research officer, government. Collaboration with Quar[place] manager, Dr. Deborra Medina regarding development and update of comprehensive plan of care as evidenced by provider attestation and co-signature. Level of care options discussed at length. Inter-disciplinary care team collaboration (see longitudinal plan of care). Patient Goals/Self-Care Activities: Continue with compliance of taking medications exactly as prescribed.  Over the next two weeks, spend time talking with wife about what services and/or resources may be beneficial to you in the home. Contact LCSW directly (# [id number]) if you have questions, need assistance, or if social work needs are identified between now and our next scheduled telephone outreach call. Follow-Up Date:  11/28/2020 at 10:30am   [place] Licensed Clinical Social Worker [place]  [phone]

## 2020-11-17 ENCOUNTER — Encounter: Payer: Self-pay | Admitting: Nurse Practitioner

## 2020-11-17 ENCOUNTER — Ambulatory Visit: Payer: PPO | Admitting: Nurse Practitioner

## 2020-11-17 ENCOUNTER — Other Ambulatory Visit: Payer: Self-pay

## 2020-11-17 VITALS — BP 122/70 | HR 96 | Ht 70.0 in | Wt 187.6 lb

## 2020-11-17 DIAGNOSIS — R2681 Unsteadiness on feet: Secondary | ICD-10-CM | POA: Diagnosis not present

## 2020-11-17 DIAGNOSIS — I4821 Permanent atrial fibrillation: Secondary | ICD-10-CM | POA: Diagnosis not present

## 2020-11-17 DIAGNOSIS — W19XXXD Unspecified fall, subsequent encounter: Secondary | ICD-10-CM | POA: Diagnosis not present

## 2020-11-17 DIAGNOSIS — I10 Essential (primary) hypertension: Secondary | ICD-10-CM

## 2020-11-17 DIAGNOSIS — R4701 Aphasia: Secondary | ICD-10-CM

## 2020-11-17 DIAGNOSIS — E782 Mixed hyperlipidemia: Secondary | ICD-10-CM

## 2020-11-17 NOTE — Progress Notes (Signed)
Office Visit    Patient Name: Todd Golden Date of Encounter: 11/17/2020  Primary Care Provider:  Crecencio Mc, MD Primary Cardiologist:  Todd Sacramento, MD  Chief Complaint    87 year old male with a history of paroxysmal atrial fibrillation, prior stroke, hypertension, hyperlipidemia, and glaucoma, who presents for follow-up related to persistent atrial fibrillation.  Past Medical History    Past Medical History:  Diagnosis Date   Allergy    Arthritis    Asthma    Cataract    Chronic constipation    Colon polyps    Cough    GERD (gastroesophageal reflux disease)    Glaucoma    HOH (hard of hearing)    a. Uses hearing aids   Hyperlipidemia    Hypertension    Macular degeneration    Nephrolithiasis    stent   PAF (paroxysmal atrial fibrillation) ([place])    a. 11/2013 Dx in setting of L MCA stroke. CHA2DS2VASc = 5-->Eliquis; b. 11/2013 Echo: Nl LV fxn. Mildly dil LA.   Stroke [facility])    a. 11/2013 L MCA stroke.   Ureteral stricture    Past Surgical History:  Procedure Laterality Date   CATARACT EXTRACTION W/PHACO Left 12/20/2014   Procedure: CATARACT EXTRACTION PHACO AND INTRAOCULAR LENS PLACEMENT (IOC);  Surgeon: Todd Robson, MD;  Location: [facility] ORS;  Service: Ophthalmology;  Laterality: Left;   [place]     00:59.6 AP%    21.2 CDE    12.66 fluid pack lot #[id number]   JOINT REPLACEMENT Left 2014   knee partial replacement   kidney stent Right 1995    Allergies  No Known Allergies  History of Present Illness    87 year old male with above past medical history including persistent atrial fibrillation, prior stroke, hypertension, hyperlipidemia, and glaucoma.  In November 2015, he was admitted with expressive aphasia and mild ataxia due to left MCA stroke in the setting of atrial fibrillation.  Echo showed normal LV function while carotid Doppler showed no obstructive disease.  He was managed with Eliquis and did well over subsequent years.  In September  2021, he was noted to be in asymptomatic, rate-controlled atrial fibrillation and decision was made to continue a rate control strategy, using pindolol.  In November 2021, he fell after losing his balance while doing some yard work and struck his head on a piece of wood and Biomedical scientist.  Head CT showed no acute findings.  He was subsequently taken off of Eliquis by primary care due to concerns related to frequent falls, and was subsequently placed on aspirin.  Todd Golden was last seen in cardiology clinic in April 2022, at which time he was doing well from a cardiac standpoint.  He noted improvement in aphasia with ongoing therapy at that time however since then, his wife notes that his aphasia has worsened.  In all, he is fallen about 10 times in the past year.  Fortunately, he has not had any significant trauma.  He has a walker at home but only uses about half of the time.  He still push mows his yard, though is only able to do small portions at a time.  He denies chest pain, palpitations, dyspnea, PND, orthopnea, dizziness, syncope, edema, or early satiety.  Home Medications    Current Outpatient Medications  Medication Sig Dispense Refill   amLODipine (NORVASC) 5 MG tablet Take 1 tablet (5 mg total) by mouth daily. 90 tablet 1   aspirin EC 81 MG tablet Take  81 mg by mouth daily. Swallow whole.     citalopram (CELEXA) 10 MG tablet Take 1 tablet (10 mg total) by mouth daily. 90 tablet 1   donepezil (ARICEPT) 10 MG tablet Take 10 mg by mouth daily.     doxazosin (CARDURA) 4 MG tablet Take 1 tablet (4 mg total) by mouth daily. 90 tablet 0   EPINEPHrine 0.3 mg/0.3 mL IJ SOAJ injection Inject 0.3 mg into the muscle as needed for anaphylaxis.     hydrochlorothiazide (HYDRODIURIL) 25 MG tablet TAKE 1 TABLET(25 MG) BY MOUTH DAILY 90 tablet 1   latanoprost (XALATAN) 0.005 % ophthalmic solution Place 1 drop into both eyes at bedtime.     Multiple Vitamins-Minerals (PRESERVISION AREDS 2 PO) Take by mouth  daily.     omeprazole (PRILOSEC) 40 MG capsule TAKE 1 CAPSULE(40 MG) BY MOUTH DAILY 90 capsule 3   pindolol (VISKEN) 10 MG tablet TAKE 1 TABLET(10 MG) BY MOUTH DAILY 90 tablet 0   simvastatin (ZOCOR) 20 MG tablet TAKE 1 TABLET BY MOUTH EVERY DAY 90 tablet 1   No current facility-administered medications for this visit.     Review of Systems    Worsening aphasia and unsteady gait.  He denies chest pain, palpitations, dyspnea, pnd, orthopnea, n, v, dizziness, syncope, edema, weight gain, or early satiety.  All other systems reviewed and are otherwise negative except as noted above.  Physical Exam    VS:  BP 122/70   Pulse 96   Ht 5\' 10"  (1.778 m)   Wt 187 lb 9.6 oz (85.1 kg)   SpO2 99%   BMI 26.92 kg/m  , BMI Body mass index is 26.92 kg/m.     GEN: Well nourished, well developed, in no acute distress. HEENT: normal. Neck: Supple, no JVD, carotid bruits, or masses. Cardiac: Irregularly irregular, no murmurs, rubs, or gallops. No clubbing, cyanosis, edema.  Radials/PT 2+ and equal bilaterally.  Respiratory:  Respirations regular and unlabored, clear to auscultation bilaterally. GI: Soft, nontender, nondistended, BS + x 4. MS: no deformity or atrophy. Skin: warm and dry, no rash. Neuro: Aphasic.  Strength and sensation are intact. Psych: Normal affect.  Accessory Clinical Findings    ECG personally reviewed by me today -atrial fibrillation, 96, left axis deviation - no acute changes.  Lab Results  Component Value Date   WBC 5.5 12/01/2019   HGB 13.8 12/01/2019   HCT 41.6 12/01/2019   MCV 92.8 12/01/2019   PLT 228.0 12/01/2019   Lab Results  Component Value Date   CREATININE 1.10 08/02/2020   BUN 18 08/02/2020   NA 140 08/02/2020   K 4.0 08/02/2020   CL 102 08/02/2020   CO2 31 08/02/2020   Lab Results  Component Value Date   ALT 25 08/02/2020   AST 24 08/02/2020   ALKPHOS 55 08/02/2020   BILITOT 0.5 08/02/2020   Lab Results  Component Value Date   CHOL 131  12/01/2019   HDL 50.90 12/01/2019   LDLCALC 62 12/01/2019   TRIG 89.0 12/01/2019   CHOLHDL 3 12/01/2019    Lab Results  Component Value Date   HGBA1C 6.3 08/02/2020    Assessment & Plan    1.  Permanent atrial fibrillation: Atrial fibrillation was initially diagnosed in 2015 in the setting of left MCA stroke.  He was noted to have recurrent atrial fibrillation on office visit in September 2021, and he has been rate controlled on pindolol therapy since.  Review of heart rates over time show  that he typically trends in the 90s, and he is 96 bpm today.  He is asymptomatic.  In the setting of 10 falls in the past year, he has been off of Eliquis and is taking aspirin only.  2.  Essential hypertension: Stable on beta-blocker, amlodipine, HCTZ, and doxazosin.  3.  Hyperlipidemia: He remains on simvastatin.  His LDL was 62 last November.  I will put in for repeat fasting labs for him to have in the next month.  LFTs were normal in July.  4.  Unsteady gait/falls: As noted, he has fallen about 10 times in the past year.  Fortunately, he has not had any significant trauma.  He has a walker at home but his wife notes that he is only using it about 50% of the time.  I encouraged him to use her his walker.  5.  History of stroke/aphasia: Wife notes that aphasia has worsened over the past few months.  No other neurologic changes.  He remains on aspirin therapy.  6.  Disposition: Follow-up in 6 months or sooner if necessary.  We will arrange for follow-up fasting lipids within the next month.   Murray Hodgkins, NP 11/17/2020, 9:46 AM

## 2020-11-17 NOTE — Patient Instructions (Addendum)
Medication Instructions:  No changes at this time.  *If you need a refill on your cardiac medications before your next appointment, please call your pharmacy*   Lab Work: Lipid panel within the next month here in our office. This is a fasting lab so make sure not to eat or drink anything after midnight before except sip of water with your medications.   If you have labs (blood work) drawn today and your tests are completely normal, you will receive your results only by: [place] (if you have MyChart) OR A paper copy in the mail If you have any lab test that is abnormal or we need to change your treatment, we will call you to review the results.   Testing/Procedures: None   Follow-Up: At [facility], you and your health needs are our priority.  As part of our continuing mission to provide you with exceptional heart care, we have created designated Provider Care Teams.  These Care Teams include your primary Cardiologist (physician) and Advanced Practice Providers (APPs -  Physician Assistants and Nurse Practitioners) who all work together to provide you with the care you need, when you need it.  We recommend signing up for the patient portal called "MyChart".  Sign up information is provided on this After Visit Summary.  MyChart is used to connect with patients for Virtual Visits (Telemedicine).  Patients are able to view lab/test results, encounter notes, upcoming appointments, etc.  Non-urgent messages can be sent to your provider as well.   To learn more about what you can do with MyChart, go to [email or web address].    Your next appointment:   6 month(s)  The format for your next appointment:   In Person  Provider:   Kathlyn Sacramento, MD or Murray Hodgkins, NP

## 2020-11-20 DIAGNOSIS — I10 Essential (primary) hypertension: Secondary | ICD-10-CM

## 2020-11-20 DIAGNOSIS — I480 Paroxysmal atrial fibrillation: Secondary | ICD-10-CM

## 2020-11-21 DIAGNOSIS — J301 Allergic rhinitis due to pollen: Secondary | ICD-10-CM | POA: Diagnosis not present

## 2020-11-21 DIAGNOSIS — J3089 Other allergic rhinitis: Secondary | ICD-10-CM | POA: Diagnosis not present

## 2020-11-22 NOTE — Progress Notes (Signed)
Pt has been rescheduled. 

## 2020-11-23 DIAGNOSIS — H353211 Exudative age-related macular degeneration, right eye, with active choroidal neovascularization: Secondary | ICD-10-CM | POA: Diagnosis not present

## 2020-11-24 DIAGNOSIS — J301 Allergic rhinitis due to pollen: Secondary | ICD-10-CM | POA: Diagnosis not present

## 2020-11-24 DIAGNOSIS — J3089 Other allergic rhinitis: Secondary | ICD-10-CM | POA: Diagnosis not present

## 2020-11-25 ENCOUNTER — Other Ambulatory Visit: Payer: Self-pay | Admitting: Internal Medicine

## 2020-11-26 ENCOUNTER — Other Ambulatory Visit: Payer: Self-pay | Admitting: Internal Medicine

## 2020-11-28 ENCOUNTER — Ambulatory Visit (INDEPENDENT_AMBULATORY_CARE_PROVIDER_SITE_OTHER): Payer: PPO | Admitting: *Deleted

## 2020-11-28 DIAGNOSIS — I10 Essential (primary) hypertension: Secondary | ICD-10-CM

## 2020-11-28 DIAGNOSIS — R531 Weakness: Secondary | ICD-10-CM

## 2020-11-28 DIAGNOSIS — E785 Hyperlipidemia, unspecified: Secondary | ICD-10-CM

## 2020-11-28 DIAGNOSIS — I679 Cerebrovascular disease, unspecified: Secondary | ICD-10-CM

## 2020-11-28 DIAGNOSIS — R4701 Aphasia: Secondary | ICD-10-CM

## 2020-11-28 DIAGNOSIS — J3089 Other allergic rhinitis: Secondary | ICD-10-CM | POA: Diagnosis not present

## 2020-11-28 DIAGNOSIS — Z8673 Personal history of transient ischemic attack (TIA), and cerebral infarction without residual deficits: Secondary | ICD-10-CM

## 2020-11-28 DIAGNOSIS — H3530 Unspecified macular degeneration: Secondary | ICD-10-CM

## 2020-11-28 DIAGNOSIS — J301 Allergic rhinitis due to pollen: Secondary | ICD-10-CM | POA: Diagnosis not present

## 2020-11-28 DIAGNOSIS — R2689 Other abnormalities of gait and mobility: Secondary | ICD-10-CM

## 2020-11-28 DIAGNOSIS — G319 Degenerative disease of nervous system, unspecified: Secondary | ICD-10-CM

## 2020-11-28 DIAGNOSIS — R296 Repeated falls: Secondary | ICD-10-CM

## 2020-11-29 NOTE — Patient Instructions (Signed)
Visit Information  Patient verbalizes understanding of instructions provided today and agrees to view in [place].   Telephone follow up appointment with care management team member scheduled for:  12/13/2020 at 10:30am  [place] Licensed Clinical Social Worker [place]  [phone]

## 2020-11-29 NOTE — Chronic Care Management (AMB) (Signed)
Chronic Care Management    Clinical Social Work Note  11/29/2020 Name: Todd Golden MRN: [id number] DOB: 04-May-1930  ANUP BRIGHAM is a 87 y.o. year old male who is a primary care patient of Derrel Nip, Aris Everts, MD. The CCM team was consulted to assist the patient with chronic disease management and/or care coordination needs related to: [employer], Level of Care Concerns, and Caregiver Stress.   Engaged with patient's wife by telephone for follow-up visit in response to provider referral for social work chronic care management and care coordination services.   Consent to Services:  The patient was given information about Chronic Care Management services, agreed to services, and gave verbal consent prior to initiation of services.  Please see initial visit note for detailed documentation.   Patient agreed to services and consent obtained.   Assessment: Review of patient past medical history, allergies, medications, and health status, including review of relevant consultants reports was performed today as part of a comprehensive evaluation and provision of chronic care management and care coordination services.     SDOH (Social Determinants of Health) assessments and interventions performed:    Advanced Directives Status: Not addressed in this encounter.  CCM Care Plan  No Known Allergies  Outpatient Encounter Medications as of 11/28/2020  Medication Sig   amLODipine (NORVASC) 5 MG tablet TAKE 1 TABLET(5 MG) BY MOUTH DAILY   aspirin EC 81 MG tablet Take 81 mg by mouth daily. Swallow whole.   citalopram (CELEXA) 10 MG tablet Take 1 tablet (10 mg total) by mouth daily.   donepezil (ARICEPT) 10 MG tablet Take 10 mg by mouth daily.   doxazosin (CARDURA) 4 MG tablet Take 1 tablet (4 mg total) by mouth daily.   EPINEPHrine 0.3 mg/0.3 mL IJ SOAJ injection Inject 0.3 mg into the muscle as needed for anaphylaxis.   hydrochlorothiazide (HYDRODIURIL) 25 MG tablet TAKE 1 TABLET(25 MG)  BY MOUTH DAILY   latanoprost (XALATAN) 0.005 % ophthalmic solution Place 1 drop into both eyes at bedtime.   Multiple Vitamins-Minerals (PRESERVISION AREDS 2 PO) Take by mouth daily.   omeprazole (PRILOSEC) 40 MG capsule TAKE 1 CAPSULE(40 MG) BY MOUTH DAILY   pindolol (VISKEN) 10 MG tablet TAKE 1 TABLET(10 MG) BY MOUTH DAILY   simvastatin (ZOCOR) 20 MG tablet TAKE 1 TABLET BY MOUTH EVERY DAY   No facility-administered encounter medications on file as of 11/28/2020.    Patient Active Problem List   Diagnosis Date Noted   Allergic rhinitis 08/16/2020   Allergic rhinitis due to animal (cat) (dog) hair and dander 08/16/2020   Allergic rhinitis due to pollen 08/16/2020   Proximal leg weakness 08/03/2020   Hx of completed stroke 02/02/2020   Weakness 02/02/2020   Diffuse brain atrophy ([place]) 12/09/2019   Balance problem 12/03/2019   Acquired thrombophilia ([place]) 12/03/2019   Prediabetes 05/29/2019   Benign prostatic hyperplasia with urinary frequency 08/20/2018   Hearing aid worn 08/16/2017   Expressive aphasia 08/16/2017   History of cataract surgery 08/27/2016   Age-related macular degeneration 08/27/2016   Counseling regarding end of life decision making 08/27/2016   Do not resuscitate status 08/27/2016   BPH associated with nocturia 02/11/2015   PAF (paroxysmal atrial fibrillation) ([place]) 12/14/2013   Hyperlipidemia LDL goal <100 12/12/2013   CVD (cerebrovascular disease) 12/10/2013   GERD (gastroesophageal reflux disease) 07/25/2013   Bilateral hand numbness 07/25/2013   Chronic constipation 01/21/2013   Encounter for preventive health examination 01/21/2013   Essential hypertension, benign 10/21/2012  Glaucoma 10/21/2012   History of colonic polyps 10/21/2012   Ureteral stricture, right 10/21/2012    Conditions to be addressed/monitored: HTN and HLD.  Limited Social Support, Level of Care Concerns, ADL/IADL Limitations, Limited Access to Caregiver, Cognitive Deficits, Memory  Deficits, and Lacks Knowledge of [employer].  Care Plan : LCSW Plan of Care  Updates made by Francis Gaines, LCSW since 11/29/2020 12:00 AM     Problem: Prevent Falls and Injury.   Priority: High     Goal: Absence of Fall and Fall-Related Injury.   Start Date: 11/14/2020  Expected End Date: 01/15/2021  This Visit's Progress: On track  Recent Progress: On track  Priority: High  Note:   Current Barriers:    Level of Care Concerns, ADL/IADL Limitations, Limited Access to Caregiver, and Lacks Knowledge of [employer]. Clinical Goals:  Patient and wife will work with LCSW to identify services and resources that may be beneficial to them in the home. Clinical Interventions:  Problem Solving/Task-Centered Strategies reviewed.  Caregiver Stress acknowledged. Consideration of In-Home Help discussed.  Reviewed various resources, discussed options and provided patient's wife with the following information: Collaboration with Primary Care Provider, Dr. Deborra Medina regarding development and update of comprehensive plan of care as evidenced by provider attestation and co-signature. Inter-disciplinary care team collaboration (see longitudinal plan of care). Patient Goals/Self-Care Activities: Confirmed receipt of resource information with wife, and thoroughly reviewed the following programs and services available to patient, answering questions, and agreeing to assist with the referral and/or application process, if necessary: Adult Day Care Services - PACE (Program of [place] for the Elderly) and ACE ([place] for Enrichment). In-Home Help - PCS (Personal Care Services), CAPS Forensic scientist), In-Home Aide Services Program, [place] and        Attendance Benefits and Private Pay.   Prepared Meal Delivery Services - Meals-on-Wheels and Mom's Meals. Radio producer, Nurse, learning disability, Social research officer, government. Wife requests additional  time to review resource information with husband to determine whether or not he will be agreeable to services.  Contact LCSW directly (# [id number]) if you have questions, need assistance, or if social work needs are identified between now and our next scheduled telephone outreach call.  Follow-Up Date:  12/13/2020 at 10:30am   [place] Licensed Clinical Social Worker [place]  [phone]

## 2020-11-30 ENCOUNTER — Other Ambulatory Visit: Payer: Self-pay | Admitting: Internal Medicine

## 2020-12-05 DIAGNOSIS — J301 Allergic rhinitis due to pollen: Secondary | ICD-10-CM | POA: Diagnosis not present

## 2020-12-05 DIAGNOSIS — J3089 Other allergic rhinitis: Secondary | ICD-10-CM | POA: Diagnosis not present

## 2020-12-12 DIAGNOSIS — J301 Allergic rhinitis due to pollen: Secondary | ICD-10-CM | POA: Diagnosis not present

## 2020-12-12 DIAGNOSIS — J3089 Other allergic rhinitis: Secondary | ICD-10-CM | POA: Diagnosis not present

## 2020-12-12 DIAGNOSIS — J3081 Allergic rhinitis due to animal (cat) (dog) hair and dander: Secondary | ICD-10-CM | POA: Diagnosis not present

## 2020-12-13 ENCOUNTER — Ambulatory Visit: Payer: PPO | Admitting: *Deleted

## 2020-12-13 ENCOUNTER — Encounter: Payer: Self-pay | Admitting: *Deleted

## 2020-12-13 DIAGNOSIS — E785 Hyperlipidemia, unspecified: Secondary | ICD-10-CM

## 2020-12-13 DIAGNOSIS — R531 Weakness: Secondary | ICD-10-CM

## 2020-12-13 DIAGNOSIS — R2689 Other abnormalities of gait and mobility: Secondary | ICD-10-CM

## 2020-12-13 DIAGNOSIS — Z8673 Personal history of transient ischemic attack (TIA), and cerebral infarction without residual deficits: Secondary | ICD-10-CM

## 2020-12-13 DIAGNOSIS — R296 Repeated falls: Secondary | ICD-10-CM

## 2020-12-13 DIAGNOSIS — Z7189 Other specified counseling: Secondary | ICD-10-CM

## 2020-12-13 DIAGNOSIS — R4701 Aphasia: Secondary | ICD-10-CM

## 2020-12-13 DIAGNOSIS — H3530 Unspecified macular degeneration: Secondary | ICD-10-CM

## 2020-12-13 DIAGNOSIS — I10 Essential (primary) hypertension: Secondary | ICD-10-CM

## 2020-12-13 DIAGNOSIS — I679 Cerebrovascular disease, unspecified: Secondary | ICD-10-CM

## 2020-12-13 NOTE — Chronic Care Management (AMB) (Signed)
Chronic Care Management    Clinical Social Work Note  12/13/2020 Name: Todd Golden MRN: [id number] DOB: 1930/03/21  Todd Golden is a 87 y.o. year old male who is a primary care patient of Derrel Nip, Aris Everts, MD. The CCM team was consulted to assist the patient with chronic disease management and/or care coordination needs related to: [employer], Level of Care Concerns, and Caregiver Stress.   Engaged with patient's wife by telephone for follow up visit in response to provider referral for social work chronic care management and care coordination services.   Consent to Services:  The patient was given information about Chronic Care Management services, agreed to services, and gave verbal consent prior to initiation of services.  Please see initial visit note for detailed documentation.   Patient agreed to services and consent obtained.   Assessment: Review of patient past medical history, allergies, medications, and health status, including review of relevant consultants reports was performed today as part of a comprehensive evaluation and provision of chronic care management and care coordination services.     SDOH (Social Determinants of Health) assessments and interventions performed:    Advanced Directives Status: Not addressed in this encounter.  CCM Care Plan  No Known Allergies  Outpatient Encounter Medications as of 12/13/2020  Medication Sig   amLODipine (NORVASC) 5 MG tablet TAKE 1 TABLET(5 MG) BY MOUTH DAILY   aspirin EC 81 MG tablet Take 81 mg by mouth daily. Swallow whole.   citalopram (CELEXA) 10 MG tablet Take 1 tablet (10 mg total) by mouth daily.   donepezil (ARICEPT) 10 MG tablet Take 10 mg by mouth daily.   doxazosin (CARDURA) 4 MG tablet Take 1 tablet (4 mg total) by mouth daily.   EPINEPHrine 0.3 mg/0.3 mL IJ SOAJ injection Inject 0.3 mg into the muscle as needed for anaphylaxis.   hydrochlorothiazide (HYDRODIURIL) 25 MG tablet TAKE 1 TABLET(25  MG) BY MOUTH DAILY   latanoprost (XALATAN) 0.005 % ophthalmic solution Place 1 drop into both eyes at bedtime.   Multiple Vitamins-Minerals (PRESERVISION AREDS 2 PO) Take by mouth daily.   omeprazole (PRILOSEC) 40 MG capsule TAKE 1 CAPSULE(40 MG) BY MOUTH DAILY   pindolol (VISKEN) 10 MG tablet TAKE 1 TABLET(10 MG) BY MOUTH DAILY   simvastatin (ZOCOR) 20 MG tablet TAKE 1 TABLET BY MOUTH EVERY DAY   No facility-administered encounter medications on file as of 12/13/2020.    Patient Active Problem List   Diagnosis Date Noted   Allergic rhinitis 08/16/2020   Allergic rhinitis due to animal (cat) (dog) hair and dander 08/16/2020   Allergic rhinitis due to pollen 08/16/2020   Proximal leg weakness 08/03/2020   Hx of completed stroke 02/02/2020   Weakness 02/02/2020   Diffuse brain atrophy ([place]) 12/09/2019   Balance problem 12/03/2019   Acquired thrombophilia ([place]) 12/03/2019   Prediabetes 05/29/2019   Benign prostatic hyperplasia with urinary frequency 08/20/2018   Hearing aid worn 08/16/2017   Expressive aphasia 08/16/2017   History of cataract surgery 08/27/2016   Age-related macular degeneration 08/27/2016   Counseling regarding end of life decision making 08/27/2016   Do not resuscitate status 08/27/2016   BPH associated with nocturia 02/11/2015   PAF (paroxysmal atrial fibrillation) ([place]) 12/14/2013   Hyperlipidemia LDL goal <100 12/12/2013   CVD (cerebrovascular disease) 12/10/2013   GERD (gastroesophageal reflux disease) 07/25/2013   Bilateral hand numbness 07/25/2013   Chronic constipation 01/21/2013   Encounter for preventive health examination 01/21/2013   Essential hypertension, benign 10/21/2012  Glaucoma 10/21/2012   History of colonic polyps 10/21/2012   Ureteral stricture, right 10/21/2012    Conditions to be addressed/monitored: HTN, HLD, and GERD.  Level of Care Concerns, ADL/IADL Limitations, Limited Access to Caregiver, Memory Deficits, and Lacks Knowledge of  [employer].  Care Plan : LCSW Plan of Care  Updates made by Francis Gaines, LCSW since 12/13/2020 12:00 AM     Problem: Prevent Falls and Injury.   Priority: High     Goal: Absence of Fall and Fall-Related Injury.   Start Date: 11/14/2020  Expected End Date: 01/15/2021  This Visit's Progress: On track  Recent Progress: On track  Priority: High  Note:   Current Barriers:    Level of Care Concerns, ADL/IADL Limitations, Limited Access to Caregiver, and Lacks Knowledge of [employer]. Clinical Goals:  Patient and wife will work with LCSW to identify services and resources that may be beneficial to them in the home. Clinical Interventions:  Task-Centered Strategies reviewed.  Caregiver Stress acknowledged. Consideration of In-Home Help discussed.  Verbalization of Feelings encouraged. Emotional Support provided. Collaboration with Primary Care Provider, Dr. Deborra Medina regarding development and update of comprehensive plan of care as evidenced by provider attestation and co-signature. Inter-disciplinary care team collaboration (see longitudinal plan of care). Re-mailed packet of resource information, as previous packet of resources have been misplaced.  Patient Goals/Self-Care Activities: Review packet of resource information, mailed to your home on (12/13/2020), notifying LCSW if you need assistance with the referral process or application completion/submission.  Enclosed: Adult Day Care Programs and Services; [place] and [place]; Prepared Meal Delivery Services - Meals-on-Wheels ([employer]); [employer]; [place] and Resources. During the holiday season, while all family members are together, review resource information, discuss options, and develop a plan of care.  Contact LCSW directly (# [id number]) if you have questions, need assistance, or if  social work needs are identified between now and our next scheduled telephone outreach call.  Follow-Up Date:  12/27/2020 at 11:45 am   [place] Clinical Social Worker [place]  [phone]

## 2020-12-13 NOTE — Patient Instructions (Signed)
Visit Information  Thank you for taking time to visit with me today. Please don't hesitate to contact me if I can be of assistance to you before our next scheduled telephone appointment.  Following are the goals we discussed today:   Patient Goals/Self-Care Activities: Review packet of resource information, mailed to your home on (12/13/2020), notifying LCSW if you need assistance with the referral process or application completion/submission.  Enclosed: Adult Day Care Programs and Services; [place] and [place]; Prepared Meal Delivery Services - Meals-on-Wheels ([employer]); [employer]; [place] and Resources. During the holiday season, while all family members are together, review resource information, discuss options, and develop a plan of care.  Contact LCSW directly (# [id number]) if you have questions, need assistance, or if social work needs are identified between now and our next scheduled telephone outreach call.   Our next appointment is by telephone on 12/27/2020 at 11:45 am  Please call the care guide team at [phone] if you need to cancel or reschedule your appointment.   Please call the Suicide and Crisis Lifeline: 988 call the [place] National Suicide Prevention Lifeline: 916 773 2090 or TTY: 530 040 8141 TTY 229-007-9760) to talk to a trained counselor call 1-800-273-TALK (toll free, 24 hour hotline) go to [facility] [address], [employer] [phone]) call 911 if you are experiencing a Mental Health or [place] or need someone to talk to.  Patient verbalizes understanding of instructions provided today and agrees to view in [place].   Nat Christen LCSW Licensed Clinical Social Worker [place]  [phone]

## 2020-12-18 ENCOUNTER — Other Ambulatory Visit (INDEPENDENT_AMBULATORY_CARE_PROVIDER_SITE_OTHER): Payer: PPO

## 2020-12-18 ENCOUNTER — Other Ambulatory Visit: Payer: Self-pay

## 2020-12-18 ENCOUNTER — Telehealth: Payer: Self-pay | Admitting: *Deleted

## 2020-12-18 DIAGNOSIS — I4821 Permanent atrial fibrillation: Secondary | ICD-10-CM | POA: Diagnosis not present

## 2020-12-18 DIAGNOSIS — E782 Mixed hyperlipidemia: Secondary | ICD-10-CM | POA: Diagnosis not present

## 2020-12-18 NOTE — Telephone Encounter (Signed)
Patient came in to have his labs drawn today. Patient's wife is requesting results be called to them.

## 2020-12-18 NOTE — Telephone Encounter (Signed)
Spoke with patients wife per release form.  Reviewed that results go to provider for interpretation and then we will call with those results. Advised that if she has not heard anything by next week to please give [place] a call back. She verbalized understanding with no further questions at this time.

## 2020-12-19 DIAGNOSIS — J301 Allergic rhinitis due to pollen: Secondary | ICD-10-CM | POA: Diagnosis not present

## 2020-12-19 DIAGNOSIS — J3089 Other allergic rhinitis: Secondary | ICD-10-CM | POA: Diagnosis not present

## 2020-12-19 LAB — LIPID PANEL
Chol/HDL Ratio: 2.4 ratio (ref 0.0–5.0)
Cholesterol, Total: 133 mg/dL (ref 100–199)
HDL: 55 mg/dL (ref >39)
LDL Chol Calc (NIH): 60 mg/dL (ref 0–99)
Triglycerides: 99 mg/dL (ref 0–149)
VLDL Cholesterol Cal: 18 mg/dL (ref 5–40)

## 2020-12-20 DIAGNOSIS — E785 Hyperlipidemia, unspecified: Secondary | ICD-10-CM

## 2020-12-20 DIAGNOSIS — I10 Essential (primary) hypertension: Secondary | ICD-10-CM

## 2020-12-26 DIAGNOSIS — J3089 Other allergic rhinitis: Secondary | ICD-10-CM | POA: Diagnosis not present

## 2020-12-26 DIAGNOSIS — J301 Allergic rhinitis due to pollen: Secondary | ICD-10-CM | POA: Diagnosis not present

## 2020-12-27 ENCOUNTER — Ambulatory Visit: Payer: PPO | Admitting: *Deleted

## 2020-12-27 DIAGNOSIS — I10 Essential (primary) hypertension: Secondary | ICD-10-CM

## 2020-12-27 DIAGNOSIS — R296 Repeated falls: Secondary | ICD-10-CM

## 2020-12-27 DIAGNOSIS — E785 Hyperlipidemia, unspecified: Secondary | ICD-10-CM

## 2020-12-27 DIAGNOSIS — I679 Cerebrovascular disease, unspecified: Secondary | ICD-10-CM

## 2020-12-27 DIAGNOSIS — R2689 Other abnormalities of gait and mobility: Secondary | ICD-10-CM

## 2020-12-27 DIAGNOSIS — H3530 Unspecified macular degeneration: Secondary | ICD-10-CM

## 2020-12-27 DIAGNOSIS — R4701 Aphasia: Secondary | ICD-10-CM

## 2020-12-27 DIAGNOSIS — Z8673 Personal history of transient ischemic attack (TIA), and cerebral infarction without residual deficits: Secondary | ICD-10-CM

## 2020-12-27 DIAGNOSIS — G319 Degenerative disease of nervous system, unspecified: Secondary | ICD-10-CM

## 2020-12-27 NOTE — Chronic Care Management (AMB) (Signed)
Chronic Care Management    Clinical Social Work Note  12/27/2020 Name: Todd Golden MRN: [id number] DOB: 11/21/1930  Todd Golden is a 87 y.o. year old male who is a primary care patient of Derrel Nip, Aris Everts, MD. The CCM team was consulted to assist the patient with chronic disease management and/or care coordination needs related to: [employer], Food Insecurity, Level of Care Concerns, and Caregiver Stress.   Engaged with patient's wife by telephone for follow up visit in response to provider referral for social work chronic care management and care coordination services.   Consent to Services:  The patient was given information about Chronic Care Management services, agreed to services, and gave verbal consent prior to initiation of services.  Please see initial visit note for detailed documentation.   Patient agreed to services and consent obtained.   Assessment: Review of patient past medical history, allergies, medications, and health status, including review of relevant consultants reports was performed today as part of a comprehensive evaluation and provision of chronic care management and care coordination services.     SDOH (Social Determinants of Health) assessments and interventions performed:    Advanced Directives Status: Not addressed in this encounter.  CCM Care Plan  No Known Allergies  Outpatient Encounter Medications as of 12/27/2020  Medication Sig   amLODipine (NORVASC) 5 MG tablet TAKE 1 TABLET(5 MG) BY MOUTH DAILY   aspirin EC 81 MG tablet Take 81 mg by mouth daily. Swallow whole.   citalopram (CELEXA) 10 MG tablet Take 1 tablet (10 mg total) by mouth daily.   donepezil (ARICEPT) 10 MG tablet Take 10 mg by mouth daily.   doxazosin (CARDURA) 4 MG tablet Take 1 tablet (4 mg total) by mouth daily.   EPINEPHrine 0.3 mg/0.3 mL IJ SOAJ injection Inject 0.3 mg into the muscle as needed for anaphylaxis.   hydrochlorothiazide (HYDRODIURIL) 25 MG tablet TAKE  1 TABLET(25 MG) BY MOUTH DAILY   latanoprost (XALATAN) 0.005 % ophthalmic solution Place 1 drop into both eyes at bedtime.   Multiple Vitamins-Minerals (PRESERVISION AREDS 2 PO) Take by mouth daily.   omeprazole (PRILOSEC) 40 MG capsule TAKE 1 CAPSULE(40 MG) BY MOUTH DAILY   pindolol (VISKEN) 10 MG tablet TAKE 1 TABLET(10 MG) BY MOUTH DAILY   simvastatin (ZOCOR) 20 MG tablet TAKE 1 TABLET BY MOUTH EVERY DAY   No facility-administered encounter medications on file as of 12/27/2020.    Patient Active Problem List   Diagnosis Date Noted   Allergic rhinitis 08/16/2020   Allergic rhinitis due to animal (cat) (dog) hair and dander 08/16/2020   Allergic rhinitis due to pollen 08/16/2020   Proximal leg weakness 08/03/2020   Hx of completed stroke 02/02/2020   Weakness 02/02/2020   Diffuse brain atrophy ([place]) 12/09/2019   Balance problem 12/03/2019   Acquired thrombophilia ([place]) 12/03/2019   Prediabetes 05/29/2019   Benign prostatic hyperplasia with urinary frequency 08/20/2018   Hearing aid worn 08/16/2017   Expressive aphasia 08/16/2017   History of cataract surgery 08/27/2016   Age-related macular degeneration 08/27/2016   Counseling regarding end of life decision making 08/27/2016   Do not resuscitate status 08/27/2016   BPH associated with nocturia 02/11/2015   PAF (paroxysmal atrial fibrillation) ([place]) 12/14/2013   Hyperlipidemia LDL goal <100 12/12/2013   CVD (cerebrovascular disease) 12/10/2013   GERD (gastroesophageal reflux disease) 07/25/2013   Bilateral hand numbness 07/25/2013   Chronic constipation 01/21/2013   Encounter for preventive health examination 01/21/2013   Essential hypertension,  benign 10/21/2012   Glaucoma 10/21/2012   History of colonic polyps 10/21/2012   Ureteral stricture, right 10/21/2012    Conditions to be addressed/monitored: HTN and HLD.  Limited Social Support, Limited Access to [employer], Level of Care Concerns, ADL/IADL Limitations, Social Isolation,  Limited Access to Caregiver, Cognitive Deficits, Memory Deficits, and Lacks Knowledge of [employer].  Care Plan : LCSW Plan of Care  Updates made by Francis Gaines, LCSW since 12/27/2020 12:00 AM     Problem: Prevent Falls and Injury.   Priority: High     Goal: Absence of Fall and Fall-Related Injury.   Start Date: 11/14/2020  Expected End Date: 01/31/2021  This Visit's Progress: On track  Recent Progress: On track  Priority: High  Note:   Current Barriers:    Level of Care Concerns, ADL/IADL Limitations, Limited Access to Caregiver, and Lacks Knowledge of [employer]. Clinical Goals:  Patient and wife will work with LCSW to identify services and resources that may be beneficial to them in the home. Clinical Interventions:  Task-Centered Strategies reviewed.  Caregiver Stress acknowledged. Consideration of In-Home Help discussed.  Verbalization of Feelings encouraged. Emotional Support provided. Collaboration with Primary Care Provider, Dr. Deborra Medina regarding development and update of comprehensive plan of care as evidenced by provider attestation and co-signature. Inter-disciplinary care team collaboration (see longitudinal plan of care).  Patient Goals/Self-Care Activities: Confirmation from wife that packet of resource information was received. Reviewed resource information and applications with wife, which included all of the following:   ~ Adult Day Care Programs & Services. ~ [place] and [place]. ~ Prepared Meal Delivery Services (I.e Meals-on-Wheels). ~ [employer]. ~ [employer] and [employer]. Review and discuss the various resource options with your family, contacting LCSW directly (# [phone]), if additional questions arise, or if you need assistance with application completion and/or submission.    Follow-Up Date:   01/31/2021 at 9:45 am   [place] Clinical Social Worker [place]  [phone]

## 2020-12-27 NOTE — Patient Instructions (Signed)
Visit Information  Thank you for taking time to visit with me today. Please don't hesitate to contact me if I can be of assistance to you before our next scheduled telephone appointment.  Following are the goals we discussed today:   Patient Goals/Self-Care Activities: Confirmation from wife that packet of resource information was received. Reviewed resource information and applications with wife, which included all of the following:   ~ Adult Day Care Programs & Services. ~ [place] and [place]. ~ Prepared Meal Delivery Services (I.e Meals-on-Wheels). ~ [employer]. ~ [employer] and [employer]. Review and discuss the various resource options with your family, contacting LCSW directly (# [phone]), if additional questions arise, or if you need assistance with application completion and/or submission.     Our next appointment is by telephone on 01/31/2021 at 9:45 am.  Please call the care guide team at [phone] if you need to cancel or reschedule your appointment.   If you are experiencing a Mental Health or [place] or need someone to talk to, please call the Suicide and Crisis Lifeline: 988 call the [place] National Suicide Prevention Lifeline: (364)092-0425 or TTY: 515-357-7466 TTY [phone]) to talk to a trained counselor call 1-800-273-TALK (toll free, 24 hour hotline) go to [facility] [address], [place] [phone]) call the [place]: [phone] call 911   Patient verbalizes understanding of instructions provided today and agrees to view in [place].   Nat Christen LCSW Licensed Clinical Social Worker [place]  [phone]

## 2020-12-29 ENCOUNTER — Ambulatory Visit: Payer: PPO | Admitting: *Deleted

## 2020-12-29 DIAGNOSIS — R4701 Aphasia: Secondary | ICD-10-CM

## 2020-12-29 DIAGNOSIS — E785 Hyperlipidemia, unspecified: Secondary | ICD-10-CM

## 2020-12-29 DIAGNOSIS — I10 Essential (primary) hypertension: Secondary | ICD-10-CM

## 2020-12-29 NOTE — Chronic Care Management (AMB) (Signed)
  Care Management   Follow Up Note   12/29/2020 Name: Todd Golden MRN: [id number] DOB: 05/10/1930   Referred by: Crecencio Mc, MD Reason for referral : Chronic Care Management (Initial)   Successful telephone outreach to patient's wife for initial assessment.  RNCM reintroduced self and role.  Wife verbally agrees to [place] (patient with history of stroke with residual aphasia).  Wife unable to complete appointment today due to appointment; request call back another day and time.  Follow Up Plan: The care management team will reach out to the patient again over the next 10 days.   [name] RN, MSN RN Care Management Coordinator [place] [phone] [email or web address]

## 2021-01-01 ENCOUNTER — Ambulatory Visit (INDEPENDENT_AMBULATORY_CARE_PROVIDER_SITE_OTHER): Payer: PPO | Admitting: *Deleted

## 2021-01-01 DIAGNOSIS — R4701 Aphasia: Secondary | ICD-10-CM

## 2021-01-01 DIAGNOSIS — I10 Essential (primary) hypertension: Secondary | ICD-10-CM

## 2021-01-01 DIAGNOSIS — R531 Weakness: Secondary | ICD-10-CM

## 2021-01-01 DIAGNOSIS — Z8673 Personal history of transient ischemic attack (TIA), and cerebral infarction without residual deficits: Secondary | ICD-10-CM

## 2021-01-01 DIAGNOSIS — R296 Repeated falls: Secondary | ICD-10-CM

## 2021-01-02 DIAGNOSIS — J301 Allergic rhinitis due to pollen: Secondary | ICD-10-CM | POA: Diagnosis not present

## 2021-01-02 DIAGNOSIS — J3089 Other allergic rhinitis: Secondary | ICD-10-CM | POA: Diagnosis not present

## 2021-01-04 DIAGNOSIS — R4701 Aphasia: Secondary | ICD-10-CM | POA: Diagnosis not present

## 2021-01-04 DIAGNOSIS — R413 Other amnesia: Secondary | ICD-10-CM | POA: Insufficient documentation

## 2021-01-04 DIAGNOSIS — R200 Anesthesia of skin: Secondary | ICD-10-CM | POA: Diagnosis not present

## 2021-01-04 DIAGNOSIS — Z9181 History of falling: Secondary | ICD-10-CM | POA: Diagnosis not present

## 2021-01-04 DIAGNOSIS — R531 Weakness: Secondary | ICD-10-CM | POA: Diagnosis not present

## 2021-01-04 DIAGNOSIS — Z8673 Personal history of transient ischemic attack (TIA), and cerebral infarction without residual deficits: Secondary | ICD-10-CM | POA: Diagnosis not present

## 2021-01-07 NOTE — Patient Instructions (Addendum)
Visit Information   Thank you for taking time to visit with me today. Please don't hesitate to contact me if I can be of assistance to you before our next scheduled telephone appointment.  Following are the goals we discussed today:  Take medications as prescribed   Attend all scheduled provider appointments check blood pressure 3 times per week write blood pressure results in a log or diary learn about high blood pressure keep a blood pressure log take blood pressure log to all doctor appointments call doctor for signs and symptoms of high blood pressure report new symptoms to your doctor Use walker with all ambulation  Our next appointment is by telephone on 02/05/21 at 1000  Please call the care guide team at [phone] if you need to cancel or reschedule your appointment.   If you are experiencing a Mental Health or [place] or need someone to talk to, please call the Suicide and Crisis Lifeline: 988 call the [place] National Suicide Prevention Lifeline: (213)858-2271 or TTY: 817-747-1467 TTY 828-019-4227) to talk to a trained counselor call 1-800-273-TALK (toll free, 24 hour hotline) call 911   Following is a copy of your full care plan:  There are no care plans that you recently modified to display for this patient.   Consent to CCM Services: Todd Golden was given information about Chronic Care Management services including:  CCM service includes personalized support from designated clinical staff supervised by his physician, including individualized plan of care and coordination with other care providers 24/7 contact phone numbers for assistance for urgent and routine care needs. Service will only be billed when office clinical staff spend 20 minutes or more in a month to coordinate care. Only one practitioner may furnish and bill the service in a calendar month. The patient may stop CCM services at any time (effective at the end of the month) by phone call  to the office staff. The patient will be responsible for cost sharing (co-pay) of up to 20% of the service fee (after annual deductible is met).  Patient agreed to services and verbal consent obtained.   The patient verbalized understanding of instructions, educational materials, and care plan provided today and declined offer to receive copy of patient instructions, educational materials, and care plan.   The care management team will reach out to the patient again over the next 45 days.

## 2021-01-07 NOTE — Chronic Care Management (AMB) (Signed)
Chronic Care Management   CCM RN Visit Note  01/07/2021 Name: Todd Golden MRN: [id number] DOB: [birth year]  Subjective: Todd Golden is a 87 y.o. year old male who is a primary care patient of Darrick Huntsman, Mar Daring, MD. The care management team was consulted for assistance with disease management and care coordination needs.    Engaged with patient by telephone for follow up visit in response to provider referral for case management and/or care coordination services.   Consent to Services:  The patient was given information about Chronic Care Management services, agreed to services, and gave verbal consent prior to initiation of services.  Please see initial visit note for detailed documentation.   Patient agreed to services and verbal consent obtained.   Assessment: Review of patient past medical history, allergies, medications, health status, including review of consultants reports, laboratory and other test data, was performed as part of comprehensive evaluation and provision of chronic care management services.   SDOH (Social Determinants of Health) assessments and interventions performed:  SDOH Interventions    Flowsheet Row Most Recent Value  SDOH Interventions   Food Insecurity Interventions Intervention Not Indicated  Financial Strain Interventions Intervention Not Indicated  Housing Interventions Intervention Not Indicated  Transportation Interventions Intervention Not Indicated        CCM Care Plan  No Known Allergies  Outpatient Encounter Medications as of 01/01/2021  Medication Sig   amLODipine (NORVASC) 5 MG tablet TAKE 1 TABLET(5 MG) BY MOUTH DAILY   aspirin EC 81 MG tablet Take 81 mg by mouth daily. Swallow whole.   citalopram (CELEXA) 10 MG tablet Take 1 tablet (10 mg total) by mouth daily.   donepezil (ARICEPT) 10 MG tablet Take 10 mg by mouth daily.   doxazosin (CARDURA) 4 MG tablet Take 1 tablet (4 mg total) by mouth daily.   EPINEPHrine 0.3 mg/0.3 mL  IJ SOAJ injection Inject 0.3 mg into the muscle as needed for anaphylaxis.   hydrochlorothiazide (HYDRODIURIL) 25 MG tablet TAKE 1 TABLET(25 MG) BY MOUTH DAILY   latanoprost (XALATAN) 0.005 % ophthalmic solution Place 1 drop into both eyes at bedtime.   Multiple Vitamins-Minerals (PRESERVISION AREDS 2 PO) Take by mouth daily.   pindolol (VISKEN) 10 MG tablet TAKE 1 TABLET(10 MG) BY MOUTH DAILY   simvastatin (ZOCOR) 20 MG tablet TAKE 1 TABLET BY MOUTH EVERY DAY   omeprazole (PRILOSEC) 40 MG capsule TAKE 1 CAPSULE(40 MG) BY MOUTH DAILY (Patient not taking: Reported on 01/01/2021)   No facility-administered encounter medications on file as of 01/01/2021.    Patient Active Problem List   Diagnosis Date Noted   Allergic rhinitis 08/16/2020   Allergic rhinitis due to animal (cat) (dog) hair and dander 08/16/2020   Allergic rhinitis due to pollen 08/16/2020   Proximal leg weakness 08/03/2020   Hx of completed stroke 02/02/2020   Weakness 02/02/2020   Diffuse brain atrophy (HCC) 12/09/2019   Balance problem 12/03/2019   Acquired thrombophilia (HCC) 12/03/2019   Prediabetes 05/29/2019   Benign prostatic hyperplasia with urinary frequency 08/20/2018   Hearing aid worn 08/16/2017   Expressive aphasia 08/16/2017   History of cataract surgery 08/27/2016   Age-related macular degeneration 08/27/2016   Counseling regarding end of life decision making 08/27/2016   Do not resuscitate status 08/27/2016   BPH associated with nocturia 02/11/2015   PAF (paroxysmal atrial fibrillation) (HCC) 12/14/2013   Hyperlipidemia LDL goal <100 12/12/2013   CVD (cerebrovascular disease) 12/10/2013   GERD (gastroesophageal reflux disease) 07/25/2013  Bilateral hand numbness 07/25/2013   Chronic constipation 01/21/2013   Encounter for preventive health examination 01/21/2013   Essential hypertension, benign 10/21/2012   Glaucoma 10/21/2012   History of colonic polyps 10/21/2012   Ureteral stricture, right  10/21/2012    Conditions to be addressed/monitored:HTN and Falls  Care Plan : [place] of Care (Adult)  Updates made by Leona Singleton, RN since 01/07/2021 12:00 AM     Problem: Limited comunication and increase falls related to previous stroke   Priority: Medium     Long-Range Goal: Wife will work with CCM team to help manage chronic medical conditions and decrease falls   Start Date: 01/01/2021  Expected End Date: 01/01/2022  Priority: Medium  Note:   Current Barriers:  Knowledge Deficits related to plan of care for management of HTN and Falls  Chronic Disease Management support and education needs related to HTN and Falls Cognitive Deficits  Patient with history of stroke with residual aphasia and history of dementia; spoke with wife who reports patient is doing well.  Wife concerned about increasing confusion; has upcoming Neurology appointment.  States communication is difficult related to the aphasia and confusion.  Does not monitor blood pressures frequently; agrees to start.  RNCM Clinical Goal(s):  Patient will verbalize understanding of plan for management of HTN, Dementia, and Falls as evidenced by increasing communication with patient,decreasing falls demonstrate improved adherence to prescribed treatment plan for HTN and Dementia as evidenced by decreasing falls, monitoring blood pressures  through collaboration with RN Care manager, provider, and care team.   Interventions: 1:1 collaboration with primary care provider regarding development and update of comprehensive plan of care as evidenced by provider attestation and co-signature Inter-disciplinary care team collaboration (see longitudinal plan of care) Evaluation of current treatment plan related to  self management and patient's adherence to plan as established by provider   Falls:  (Status: New goal.) Long Term Goal  Provided written and verbal education re: potential causes of falls and Fall  prevention strategies Reviewed medications and discussed potential side effects of medications such as dizziness and frequent urination Advised patient of importance of notifying provider of falls Assessed patients knowledge of fall risk prevention secondary to previously provided education Screening for signs and symptoms of depression related to chronic disease state Assessed social determinant of health barriers Discussed need to reduce falls Encouraged increase use of walker Encouraged use of communication board as much as possible  Hypertension: (Status: New goal. Goal on Track (progressing): YES.) Last practice recorded BP readings:  BP Readings from Last 3 Encounters:  11/17/20 122/70  11/02/20 138/88  09/14/20 121/73  Most recent eGFR/CrCl: No results found for: EGFR  No components found for: CRCL  Evaluation of current treatment plan related to hypertension self management and patient's adherence to plan as established by provider;   Provided education to patient re: stroke prevention, s/s of heart attack and stroke; Reviewed prescribed diet low salt low fat Reviewed medications with patient and discussed importance of compliance;  Advised patient, providing education and rationale, to monitor blood pressure daily and record, calling PCP for findings outside established parameters;   Patient Goals/Self-Care Activities: Take medications as prescribed   Attend all scheduled provider appointments check blood pressure 3 times per week write blood pressure results in a log or diary learn about high blood pressure keep a blood pressure log take blood pressure log to all doctor appointments call doctor for signs and symptoms of high blood pressure report  new symptoms to your doctor Use walker with all ambulation       Plan:The care management team will reach out to the patient again over the next 45 days.  [name] RN, MSN RN Care Management Coordinator [place] [phone] [email or web address]

## 2021-01-09 DIAGNOSIS — J301 Allergic rhinitis due to pollen: Secondary | ICD-10-CM | POA: Diagnosis not present

## 2021-01-09 DIAGNOSIS — J3089 Other allergic rhinitis: Secondary | ICD-10-CM | POA: Diagnosis not present

## 2021-01-11 DIAGNOSIS — H353211 Exudative age-related macular degeneration, right eye, with active choroidal neovascularization: Secondary | ICD-10-CM | POA: Diagnosis not present

## 2021-01-16 DIAGNOSIS — J3089 Other allergic rhinitis: Secondary | ICD-10-CM | POA: Diagnosis not present

## 2021-01-16 DIAGNOSIS — J301 Allergic rhinitis due to pollen: Secondary | ICD-10-CM | POA: Diagnosis not present

## 2021-01-20 DIAGNOSIS — R4701 Aphasia: Secondary | ICD-10-CM

## 2021-01-20 DIAGNOSIS — I10 Essential (primary) hypertension: Secondary | ICD-10-CM | POA: Diagnosis not present

## 2021-01-23 DIAGNOSIS — J301 Allergic rhinitis due to pollen: Secondary | ICD-10-CM | POA: Diagnosis not present

## 2021-01-23 DIAGNOSIS — J3089 Other allergic rhinitis: Secondary | ICD-10-CM | POA: Diagnosis not present

## 2021-01-30 DIAGNOSIS — J301 Allergic rhinitis due to pollen: Secondary | ICD-10-CM | POA: Diagnosis not present

## 2021-01-30 DIAGNOSIS — J3089 Other allergic rhinitis: Secondary | ICD-10-CM | POA: Diagnosis not present

## 2021-01-31 ENCOUNTER — Ambulatory Visit (INDEPENDENT_AMBULATORY_CARE_PROVIDER_SITE_OTHER): Payer: PPO | Admitting: *Deleted

## 2021-01-31 DIAGNOSIS — R296 Repeated falls: Secondary | ICD-10-CM

## 2021-01-31 DIAGNOSIS — R7303 Prediabetes: Secondary | ICD-10-CM

## 2021-01-31 DIAGNOSIS — I10 Essential (primary) hypertension: Secondary | ICD-10-CM

## 2021-01-31 DIAGNOSIS — Z974 Presence of external hearing-aid: Secondary | ICD-10-CM

## 2021-01-31 DIAGNOSIS — G319 Degenerative disease of nervous system, unspecified: Secondary | ICD-10-CM

## 2021-01-31 DIAGNOSIS — R4701 Aphasia: Secondary | ICD-10-CM

## 2021-01-31 DIAGNOSIS — E785 Hyperlipidemia, unspecified: Secondary | ICD-10-CM

## 2021-01-31 DIAGNOSIS — R2689 Other abnormalities of gait and mobility: Secondary | ICD-10-CM

## 2021-01-31 DIAGNOSIS — I679 Cerebrovascular disease, unspecified: Secondary | ICD-10-CM

## 2021-01-31 DIAGNOSIS — R531 Weakness: Secondary | ICD-10-CM

## 2021-01-31 DIAGNOSIS — H3530 Unspecified macular degeneration: Secondary | ICD-10-CM

## 2021-01-31 DIAGNOSIS — Z8673 Personal history of transient ischemic attack (TIA), and cerebral infarction without residual deficits: Secondary | ICD-10-CM

## 2021-01-31 NOTE — Patient Instructions (Signed)
Visit Information  Thank you for taking time to visit with me today. Please don't hesitate to contact me if I can be of assistance to you before our next scheduled telephone appointment.  Following are the goals we discussed today:  Patient Goals/Self-Care Activities: Continued review of resource information with wife, and assistance with application completion and submission, for all of the following community agencies and resources:   ~ Adult [place] ~ [place] and [place] ~ Prepared Meal Delivery Services (I.e Meals-on-Wheels) ~ [employer] of the [place] ~ [place] and [employer] Wife agreed to contact LCSW directly [phone]) if she has questions, needs further assistance, or if additional social work needs are identified in the near future.  No Follow-Up Required.  Please call the care guide team at [phone] if you need to cancel or reschedule your appointment.   If you are experiencing a Mental Health or [place] or need someone to talk to, please call the Suicide and Crisis Lifeline: 988 call the [place] National Suicide Prevention Lifeline: 507-582-9497 or TTY: (315)643-4889 TTY (340)334-1042) to talk to a trained counselor call 1-800-273-TALK (toll free, 24 hour hotline) go to [facility] [address], [place] [id number]) call the [place]: [phone] call 911   Patient verbalizes understanding of instructions provided today and agrees to view in [place].   Nat Christen LCSW Licensed Clinical Social Worker [place]  [phone]

## 2021-01-31 NOTE — Chronic Care Management (AMB) (Signed)
Chronic Care Management    Clinical Social Work Note  01/31/2021 Name: Todd Golden MRN: [id number] DOB: [birth year]  Todd Golden is a 88 y.o. year old male who is a primary care patient of Derrel Nip, Aris Everts, MD. The CCM team was consulted to assist the patient with chronic disease management and/or care coordination needs related to: [employer], Level of Care Concerns, and Caregiver Stress.   Engaged with patient's wife by telephone for follow up visit in response to provider referral for social work chronic care management and care coordination services.   Consent to Services:  The patient was given information about Chronic Care Management services, agreed to services, and gave verbal consent prior to initiation of services.  Please see initial visit note for detailed documentation.   Patient agreed to services and consent obtained.   Assessment: Review of patient past medical history, allergies, medications, and health status, including review of relevant consultants reports was performed today as part of a comprehensive evaluation and provision of chronic care management and care coordination services.     SDOH (Social Determinants of Health) assessments and interventions performed:    Advanced Directives Status: Not addressed in this encounter.  CCM Care Plan  No Known Allergies  Outpatient Encounter Medications as of 01/31/2021  Medication Sig   amLODipine (NORVASC) 5 MG tablet TAKE 1 TABLET(5 MG) BY MOUTH DAILY   aspirin EC 81 MG tablet Take 81 mg by mouth daily. Swallow whole.   citalopram (CELEXA) 10 MG tablet Take 1 tablet (10 mg total) by mouth daily.   donepezil (ARICEPT) 10 MG tablet Take 10 mg by mouth daily.   doxazosin (CARDURA) 4 MG tablet Take 1 tablet (4 mg total) by mouth daily.   EPINEPHrine 0.3 mg/0.3 mL IJ SOAJ injection Inject 0.3 mg into the muscle as needed for anaphylaxis.   hydrochlorothiazide (HYDRODIURIL) 25 MG tablet TAKE 1 TABLET(25 MG)  BY MOUTH DAILY   latanoprost (XALATAN) 0.005 % ophthalmic solution Place 1 drop into both eyes at bedtime.   Multiple Vitamins-Minerals (PRESERVISION AREDS 2 PO) Take by mouth daily.   omeprazole (PRILOSEC) 40 MG capsule TAKE 1 CAPSULE(40 MG) BY MOUTH DAILY (Patient not taking: Reported on 01/01/2021)   pindolol (VISKEN) 10 MG tablet TAKE 1 TABLET(10 MG) BY MOUTH DAILY   simvastatin (ZOCOR) 20 MG tablet TAKE 1 TABLET BY MOUTH EVERY DAY   No facility-administered encounter medications on file as of 01/31/2021.    Patient Active Problem List   Diagnosis Date Noted   Allergic rhinitis 08/16/2020   Allergic rhinitis due to animal (cat) (dog) hair and dander 08/16/2020   Allergic rhinitis due to pollen 08/16/2020   Proximal leg weakness 08/03/2020   Hx of completed stroke 02/02/2020   Weakness 02/02/2020   Diffuse brain atrophy ([place]) 12/09/2019   Balance problem 12/03/2019   Acquired thrombophilia ([place]) 12/03/2019   Prediabetes 05/29/2019   Benign prostatic hyperplasia with urinary frequency 08/20/2018   Hearing aid worn 08/16/2017   Expressive aphasia 08/16/2017   History of cataract surgery 08/27/2016   Age-related macular degeneration 08/27/2016   Counseling regarding end of life decision making 08/27/2016   Do not resuscitate status 08/27/2016   BPH associated with nocturia 02/11/2015   PAF (paroxysmal atrial fibrillation) ([place]) 12/14/2013   Hyperlipidemia LDL goal <100 12/12/2013   CVD (cerebrovascular disease) 12/10/2013   GERD (gastroesophageal reflux disease) 07/25/2013   Bilateral hand numbness 07/25/2013   Chronic constipation 01/21/2013   Encounter for preventive health examination 01/21/2013  Essential hypertension, benign 10/21/2012   Glaucoma 10/21/2012   History of colonic polyps 10/21/2012   Ureteral stricture, right 10/21/2012    Conditions to be addressed/monitored: HLD and GERD.  Limited Social Support, Level of Care Concerns, ADL/IADL Limitations, Limited  Access to Caregiver, Cognitive Deficits, Memory Deficits, and Lacks Knowledge of [employer].  Care Plan : LCSW Plan of Care  Updates made by Francis Gaines, LCSW since 01/31/2021 12:00 AM     Problem: Prevent Falls and Injury. Resolved 01/31/2021  Priority: High     Goal: Absence of Fall and Fall-Related Injury. Completed 01/31/2021  Start Date: 11/14/2020  Expected End Date: 01/31/2021  This Visit's Progress: On track  Recent Progress: On track  Priority: High  Note:   Current Barriers:    Level of Care Concerns, ADL/IADL Limitations, Limited Access to Caregiver, and Lacks Knowledge of [employer]. Clinical Goals:  Patient and wife will work with LCSW to identify services and resources that may be beneficial to them in the home. Clinical Interventions:  Task-Centered Strategies reviewed.  Caregiver Stress acknowledged. Consideration of In-Home Help discussed.  Verbalization of Feelings encouraged. Emotional Support provided. Collaboration with Primary Care Provider, Dr. Deborra Medina regarding development and update of comprehensive plan of care as evidenced by provider attestation and co-signature. Inter-disciplinary care team collaboration (see longitudinal plan of care).  Patient Goals/Self-Care Activities: Continued review of resource information with wife, and assistance with application completion and submission, for all of the following community agencies and resources:   ~ Adult [place] ~ [place] and [place] ~ Prepared Meal Delivery Services (I.e Meals-on-Wheels) ~ [employer] of the [place] ~ [place] and [employer] Wife agreed to contact LCSW directly [phone]) if she has questions, needs further assistance, or if additional social work needs are identified in the near future.  No Follow-Up Required.   Nat Christen LCSW Licensed Clinical Social Worker [place]  [phone]

## 2021-02-05 ENCOUNTER — Ambulatory Visit: Payer: PPO | Admitting: *Deleted

## 2021-02-05 DIAGNOSIS — R4701 Aphasia: Secondary | ICD-10-CM

## 2021-02-05 DIAGNOSIS — I679 Cerebrovascular disease, unspecified: Secondary | ICD-10-CM

## 2021-02-05 DIAGNOSIS — I10 Essential (primary) hypertension: Secondary | ICD-10-CM

## 2021-02-05 DIAGNOSIS — R296 Repeated falls: Secondary | ICD-10-CM

## 2021-02-05 NOTE — Patient Instructions (Addendum)
Visit Information  Thank you for taking time to visit with me today. Please don't hesitate to contact me if I can be of assistance to you before our next scheduled telephone appointment.  Following are the goals we discussed today:  Take medications as prescribed   Attend all scheduled provider appointments check blood pressure 3 times per week write blood pressure results in a log or diary learn about high blood pressure keep a blood pressure log take blood pressure log to all doctor appointments call doctor for signs and symptoms of high blood pressure report new symptoms to your doctor Use walker with all ambulation  Our next appointment is by telephone on 03/12/21 at 10000  Please call the care guide team at [phone] if you need to cancel or reschedule your appointment.   If you are experiencing a Mental Health or [place] or need someone to talk to, please call the Suicide and Crisis Lifeline: 988 call the [place] National Suicide Prevention Lifeline: 979 676 3380 or TTY: 240-317-5541 TTY 8011445319) to talk to a trained counselor call 1-800-273-TALK (toll free, 24 hour hotline) call 911   The patient verbalized understanding of instructions, educational materials, and care plan provided today and agreed to receive a mailed copy of patient instructions, educational materials, and care plan.   [name] RN, MSN RN Care Management Coordinator [place] [phone] [email or web address]

## 2021-02-05 NOTE — Chronic Care Management (AMB) (Signed)
Chronic Care Management   CCM RN Visit Note  02/05/2021 Name: Todd Golden MRN: [id number] DOB: 12/23/1930  Subjective: Todd Golden is a 88 y.o. year old male who is a primary care patient of Darrick Huntsman, Mar Daring, MD. The care management team was consulted for assistance with disease management and care coordination needs.    Engaged with patient by telephone for follow up visit in response to provider referral for case management and/or care coordination services.   Consent to Services:  The patient was given the following information about Chronic Care Management services today, agreed to services, and gave verbal consent: 1. CCM service includes personalized support from designated clinical staff supervised by the primary care provider, including individualized plan of care and coordination with other care providers 2. 24/7 contact phone numbers for assistance for urgent and routine care needs. 3. Service will only be billed when office clinical staff spend 20 minutes or more in a month to coordinate care. 4. Only one practitioner may furnish and bill the service in a calendar month. 5.The patient may stop CCM services at any time (effective at the end of the month) by phone call to the office staff. 6. The patient will be responsible for cost sharing (co-pay) of up to 20% of the service fee (after annual deductible is met). Patient agreed to services and consent obtained.  Patient agreed to services and verbal consent obtained.   Assessment: Review of patient past medical history, allergies, medications, health status, including review of consultants reports, laboratory and other test data, was performed as part of comprehensive evaluation and provision of chronic care management services.   SDOH (Social Determinants of Health) assessments and interventions performed:    CCM Care Plan  No Known Allergies  Outpatient Encounter Medications as of 02/05/2021  Medication Sig   amLODipine  (NORVASC) 5 MG tablet TAKE 1 TABLET(5 MG) BY MOUTH DAILY   aspirin EC 81 MG tablet Take 81 mg by mouth daily. Swallow whole.   citalopram (CELEXA) 10 MG tablet Take 1 tablet (10 mg total) by mouth daily.   donepezil (ARICEPT) 10 MG tablet Take 10 mg by mouth daily.   doxazosin (CARDURA) 4 MG tablet Take 1 tablet (4 mg total) by mouth daily.   EPINEPHrine 0.3 mg/0.3 mL IJ SOAJ injection Inject 0.3 mg into the muscle as needed for anaphylaxis.   hydrochlorothiazide (HYDRODIURIL) 25 MG tablet TAKE 1 TABLET(25 MG) BY MOUTH DAILY   latanoprost (XALATAN) 0.005 % ophthalmic solution Place 1 drop into both eyes at bedtime.   Multiple Vitamins-Minerals (PRESERVISION AREDS 2 PO) Take by mouth daily.   omeprazole (PRILOSEC) 40 MG capsule TAKE 1 CAPSULE(40 MG) BY MOUTH DAILY (Patient not taking: Reported on 01/01/2021)   pindolol (VISKEN) 10 MG tablet TAKE 1 TABLET(10 MG) BY MOUTH DAILY   simvastatin (ZOCOR) 20 MG tablet TAKE 1 TABLET BY MOUTH EVERY DAY   No facility-administered encounter medications on file as of 02/05/2021.    Patient Active Problem List   Diagnosis Date Noted   Allergic rhinitis 08/16/2020   Allergic rhinitis due to animal (cat) (dog) hair and dander 08/16/2020   Allergic rhinitis due to pollen 08/16/2020   Proximal leg weakness 08/03/2020   Hx of completed stroke 02/02/2020   Weakness 02/02/2020   Diffuse brain atrophy (HCC) 12/09/2019   Balance problem 12/03/2019   Acquired thrombophilia (HCC) 12/03/2019   Prediabetes 05/29/2019   Benign prostatic hyperplasia with urinary frequency 08/20/2018   Hearing aid worn 08/16/2017  Expressive aphasia 08/16/2017   History of cataract surgery 08/27/2016   Age-related macular degeneration 08/27/2016   Counseling regarding end of life decision making 08/27/2016   Do not resuscitate status 08/27/2016   BPH associated with nocturia 02/11/2015   PAF (paroxysmal atrial fibrillation) ([place]) 12/14/2013   Hyperlipidemia LDL goal <100  12/12/2013   CVD (cerebrovascular disease) 12/10/2013   GERD (gastroesophageal reflux disease) 07/25/2013   Bilateral hand numbness 07/25/2013   Chronic constipation 01/21/2013   Encounter for preventive health examination 01/21/2013   Essential hypertension, benign 10/21/2012   Glaucoma 10/21/2012   History of colonic polyps 10/21/2012   Ureteral stricture, right 10/21/2012    Conditions to be addressed/monitored:HTN and Dementia  There are no care plans that you recently modified to display for this patient.   Plan:The care management team will reach out to the patient again over the next 45 days.  [name] RN, MSN RN Care Management Coordinator [place] [phone] [email or web address]

## 2021-02-06 DIAGNOSIS — J301 Allergic rhinitis due to pollen: Secondary | ICD-10-CM | POA: Diagnosis not present

## 2021-02-06 DIAGNOSIS — J3089 Other allergic rhinitis: Secondary | ICD-10-CM | POA: Diagnosis not present

## 2021-02-13 DIAGNOSIS — J301 Allergic rhinitis due to pollen: Secondary | ICD-10-CM | POA: Diagnosis not present

## 2021-02-13 DIAGNOSIS — J3089 Other allergic rhinitis: Secondary | ICD-10-CM | POA: Diagnosis not present

## 2021-02-16 ENCOUNTER — Other Ambulatory Visit: Payer: Self-pay | Admitting: Internal Medicine

## 2021-02-16 MED ORDER — OMEPRAZOLE 40 MG PO CPDR: DELAYED_RELEASE_CAPSULE | ORAL | 3 refills | Status: DC

## 2021-02-20 DIAGNOSIS — J301 Allergic rhinitis due to pollen: Secondary | ICD-10-CM | POA: Diagnosis not present

## 2021-02-20 DIAGNOSIS — I10 Essential (primary) hypertension: Secondary | ICD-10-CM | POA: Diagnosis not present

## 2021-02-20 DIAGNOSIS — E785 Hyperlipidemia, unspecified: Secondary | ICD-10-CM

## 2021-02-20 DIAGNOSIS — J3089 Other allergic rhinitis: Secondary | ICD-10-CM | POA: Diagnosis not present

## 2021-02-24 ENCOUNTER — Other Ambulatory Visit: Payer: Self-pay | Admitting: Internal Medicine

## 2021-02-27 DIAGNOSIS — J3089 Other allergic rhinitis: Secondary | ICD-10-CM | POA: Diagnosis not present

## 2021-02-27 DIAGNOSIS — J301 Allergic rhinitis due to pollen: Secondary | ICD-10-CM | POA: Diagnosis not present

## 2021-03-05 ENCOUNTER — Other Ambulatory Visit: Payer: Self-pay | Admitting: Internal Medicine

## 2021-03-06 DIAGNOSIS — J301 Allergic rhinitis due to pollen: Secondary | ICD-10-CM | POA: Diagnosis not present

## 2021-03-06 DIAGNOSIS — J3089 Other allergic rhinitis: Secondary | ICD-10-CM | POA: Diagnosis not present

## 2021-03-08 DIAGNOSIS — H353211 Exudative age-related macular degeneration, right eye, with active choroidal neovascularization: Secondary | ICD-10-CM | POA: Diagnosis not present

## 2021-03-12 ENCOUNTER — Telehealth: Payer: PPO

## 2021-03-12 ENCOUNTER — Telehealth: Payer: Self-pay | Admitting: *Deleted

## 2021-03-12 NOTE — Telephone Encounter (Signed)
°  Care Management   Follow Up Note   03/12/2021 Name: TRAYLON SCHIMMING MRN: [id number] DOB: [birth year]   Referred by: Crecencio Mc, MD Reason for referral : Chronic Care Management (HTN)   An unsuccessful telephone outreach was attempted today. The patient was referred to the case management team for assistance with care management and care coordination.   Follow Up Plan: RNCM will seek assistance from Care Guides in rescheduling appointment within the next 30 days.  [name] RN, MSN RN Care Management Coordinator [place] [phone] [email or web address]

## 2021-03-13 DIAGNOSIS — J3089 Other allergic rhinitis: Secondary | ICD-10-CM | POA: Diagnosis not present

## 2021-03-13 DIAGNOSIS — J301 Allergic rhinitis due to pollen: Secondary | ICD-10-CM | POA: Diagnosis not present

## 2021-03-15 ENCOUNTER — Ambulatory Visit: Payer: PPO | Admitting: Urology

## 2021-03-20 DIAGNOSIS — J3089 Other allergic rhinitis: Secondary | ICD-10-CM | POA: Diagnosis not present

## 2021-03-20 DIAGNOSIS — J301 Allergic rhinitis due to pollen: Secondary | ICD-10-CM | POA: Diagnosis not present

## 2021-03-27 DIAGNOSIS — J301 Allergic rhinitis due to pollen: Secondary | ICD-10-CM | POA: Diagnosis not present

## 2021-03-27 DIAGNOSIS — J3089 Other allergic rhinitis: Secondary | ICD-10-CM | POA: Diagnosis not present

## 2021-03-28 ENCOUNTER — Ambulatory Visit (INDEPENDENT_AMBULATORY_CARE_PROVIDER_SITE_OTHER): Payer: PPO | Admitting: *Deleted

## 2021-03-28 DIAGNOSIS — R296 Repeated falls: Secondary | ICD-10-CM

## 2021-03-28 DIAGNOSIS — I10 Essential (primary) hypertension: Secondary | ICD-10-CM

## 2021-03-28 DIAGNOSIS — R4701 Aphasia: Secondary | ICD-10-CM

## 2021-03-28 DIAGNOSIS — R2689 Other abnormalities of gait and mobility: Secondary | ICD-10-CM

## 2021-03-28 NOTE — Chronic Care Management (AMB) (Signed)
Chronic Care Management   CCM RN Visit Note  03/28/2021 Name: Todd Golden MRN: [id number] DOB: 03-Jan-1931  Subjective: Todd Golden is a 88 y.o. year old male who is a primary care patient of Derrel Nip, Aris Everts, MD. The care management team was consulted for assistance with disease management and care coordination needs.    Engaged with patient by telephone for follow up visit in response to provider referral for case management and/or care coordination services.   Consent to Services:  The patient was given information about Chronic Care Management services, agreed to services, and gave verbal consent prior to initiation of services.  Please see initial visit note for detailed documentation.   Patient agreed to services and verbal consent obtained.   Assessment: Review of patient past medical history, allergies, medications, health status, including review of consultants reports, laboratory and other test data, was performed as part of comprehensive evaluation and provision of chronic care management services.   SDOH (Social Determinants of Health) assessments and interventions performed:    CCM Care Plan  No Known Allergies  Outpatient Encounter Medications as of 03/28/2021  Medication Sig   amLODipine (NORVASC) 5 MG tablet TAKE 1 TABLET(5 MG) BY MOUTH DAILY   aspirin EC 81 MG tablet Take 81 mg by mouth daily. Swallow whole.   citalopram (CELEXA) 10 MG tablet Take 1 tablet (10 mg total) by mouth daily.   donepezil (ARICEPT) 10 MG tablet Take 10 mg by mouth daily.   doxazosin (CARDURA) 4 MG tablet Take 1 tablet (4 mg total) by mouth daily.   EPINEPHrine 0.3 mg/0.3 mL IJ SOAJ injection Inject 0.3 mg into the muscle as needed for anaphylaxis.   hydrochlorothiazide (HYDRODIURIL) 25 MG tablet TAKE 1 TABLET(25 MG) BY MOUTH DAILY   latanoprost (XALATAN) 0.005 % ophthalmic solution Place 1 drop into both eyes at bedtime.   Multiple Vitamins-Minerals (PRESERVISION AREDS 2 PO) Take by  mouth daily.   omeprazole (PRILOSEC) 40 MG capsule TAKE 1 CAPSULE(40 MG) BY MOUTH DAILY   pindolol (VISKEN) 10 MG tablet TAKE 1 TABLET(10 MG) BY MOUTH DAILY   simvastatin (ZOCOR) 20 MG tablet TAKE 1 TABLET BY MOUTH EVERY DAY   No facility-administered encounter medications on file as of 03/28/2021.    Patient Active Problem List   Diagnosis Date Noted   Allergic rhinitis 08/16/2020   Allergic rhinitis due to animal (cat) (dog) hair and dander 08/16/2020   Allergic rhinitis due to pollen 08/16/2020   Proximal leg weakness 08/03/2020   Hx of completed stroke 02/02/2020   Weakness 02/02/2020   Diffuse brain atrophy ([facility]) 12/09/2019   Balance problem 12/03/2019   Acquired thrombophilia ([facility]) 12/03/2019   Prediabetes 05/29/2019   Benign prostatic hyperplasia with urinary frequency 08/20/2018   Hearing aid worn 08/16/2017   Expressive aphasia 08/16/2017   History of cataract surgery 08/27/2016   Age-related macular degeneration 08/27/2016   Counseling regarding end of life decision making 08/27/2016   Do not resuscitate status 08/27/2016   BPH associated with nocturia 02/11/2015   PAF (paroxysmal atrial fibrillation) ([place]) 12/14/2013   Hyperlipidemia LDL goal <100 12/12/2013   CVD (cerebrovascular disease) 12/10/2013   GERD (gastroesophageal reflux disease) 07/25/2013   Bilateral hand numbness 07/25/2013   Chronic constipation 01/21/2013   Encounter for preventive health examination 01/21/2013   Essential hypertension, benign 10/21/2012   Glaucoma 10/21/2012   History of colonic polyps 10/21/2012   Ureteral stricture, right 10/21/2012    Conditions to be addressed/monitored:HTN and FALLS  Care  Plan : RNCM  General Plan of Care (Adult)  Updates made by Leona Singleton, RN since 03/28/2021 12:00 AM     Problem: Limited comunication and increase falls related to previous stroke   Priority: Medium     Long-Range Goal: Wife will work with CCM team to help manage chronic medical  conditions and decrease falls   Start Date: 01/01/2021  Expected End Date: 01/01/2022  This Visit's Progress: Not on track  Priority: Medium  Note:   Current Barriers:  Knowledge Deficits related to plan of care for management of HTN and Falls  Chronic Disease Management support and education needs related to HTN and Falls Cognitive Deficits  Patient with history of stroke with residual aphasia and history of dementia; spoke with wife who reports patient is doing well.  Wife concerned about increasing confusion; has upcoming Neurology appointment.  States communication is difficult related to the aphasia and confusion.  Does not monitor blood pressures frequently; agrees to start. Wife reports aphasia continues to worsen with the dementia, but she is still able to understand.  reports fall, Monday 3/6/2; while bending down to pick up crackers; no injury.  reports compliance with walker.  Still needs to get hone blood pressure cuff and will plan to obtain over the weekend.  RNCM Clinical Goal(s):  Patient will verbalize understanding of plan for management of HTN, Dementia, and Falls as evidenced by increasing communication with patient,decreasing falls demonstrate improved adherence to prescribed treatment plan for HTN and Dementia as evidenced by decreasing falls, monitoring blood pressures  through collaboration with RN Care manager, provider, and care team.   Interventions: 1:1 collaboration with primary care provider regarding development and update of comprehensive plan of care as evidenced by provider attestation and co-signature Inter-disciplinary care team collaboration (see longitudinal plan of care) Evaluation of current treatment plan related to  self management and patient's adherence to plan as established by provider  Falls:  (Status: Goal on track: NO.) Long Term Goal  Provided written and verbal education re: potential causes of falls and Fall prevention strategies Reviewed  medications and discussed potential side effects of medications such as dizziness and frequent urination Advised patient of importance of notifying provider of falls Assessed patients knowledge of fall risk prevention secondary to previously provided education Screening for signs and symptoms of depression related to chronic disease state Assessed social determinant of health barriers Discussed need to reduce falls Encouraged increase use of walker; congratulated on use of walker with ambulation Encouraged use of communication board as much as possible Fall precaution and preventions reviewed and discussed Encouraged use of assistive devices like reacher  Hypertension: (Status: Goal on track: NO.)  NEEDS TO OBTAIN BP CUFF Last practice recorded BP readings:  BP Readings from Last 3 Encounters:  11/17/20 122/70  11/02/20 138/88  09/14/20 121/73  Most recent eGFR/CrCl: No results found for: EGFR  No components found for: CRCL  Evaluation of current treatment plan related to hypertension self management and patient's adherence to plan as established by provider;   Provided education to patient re: stroke prevention, s/s of heart attack and stroke; Reviewed prescribed diet low salt low fat Reviewed medications with patient and discussed importance of compliance;  Advised patient, providing education and rationale, to monitor blood pressure daily and record, calling PCP for findings outside established parameters;  Encouraged to obtain bp cuff and begin to monitor  Patient Goals/Self-Care Activities: Take medications as prescribed   Attend all scheduled provider appointments check blood pressure  3 times per week write blood pressure results in a log or diary learn about high blood pressure keep a blood pressure log take blood pressure log to all doctor appointments call doctor for signs and symptoms of high blood pressure report new symptoms to your doctor Use walker with all  ambulation Use your assistive devices Obtain BP cuff and begin to monitor       Plan:The care management team will reach out to the patient again over the next 45 days.  [name] RN, MSN RN Care Management Coordinator [place] [phone] [email or web address]

## 2021-03-28 NOTE — Patient Instructions (Addendum)
Visit Information ? ?Thank you for taking time to visit with me today. Please don't hesitate to contact me if I can be of assistance to you before our next scheduled telephone appointment. ? ?Following are the goals we discussed today:  ?Take medications as prescribed   ?Attend all scheduled provider appointments ?check blood pressure 3 times per week ?write blood pressure results in a log or diary ?learn about high blood pressure ?keep a blood pressure log ?take blood pressure log to all doctor appointments ?call doctor for signs and symptoms of high blood pressure ?report new symptoms to your doctor ?Use walker with all ambulation ?Use your assistive devices ?Obtain BP cuff and begin to monitor ?  ? ?Our next appointment is by telephone on 4/19 at 1000 ? ?Please call the care guide team at [phone] if you need to cancel or reschedule your appointment.  ? ?If you are experiencing a Mental Health or [place] or need someone to talk to, please call the Suicide and Crisis Lifeline: 988 ?call the [place] National Suicide Prevention Lifeline: 909-326-6528 or TTY: (913) 403-4730 TTY 636-728-5679) to talk to a trained counselor ?call 1-800-273-TALK (toll free, 24 hour hotline) ?call 911  ? ?The patient verbalized understanding of instructions, educational materials, and care plan provided today and declined offer to receive copy of patient instructions, educational materials, and care plan.  ? ?[name] RN, MSN ?RN Care Management Coordinator ?[place] ?[phone] ?[email or web address] ?  ?

## 2021-04-03 DIAGNOSIS — J301 Allergic rhinitis due to pollen: Secondary | ICD-10-CM | POA: Diagnosis not present

## 2021-04-03 DIAGNOSIS — J3089 Other allergic rhinitis: Secondary | ICD-10-CM | POA: Diagnosis not present

## 2021-04-10 DIAGNOSIS — J3089 Other allergic rhinitis: Secondary | ICD-10-CM | POA: Diagnosis not present

## 2021-04-10 DIAGNOSIS — J301 Allergic rhinitis due to pollen: Secondary | ICD-10-CM | POA: Diagnosis not present

## 2021-04-15 ENCOUNTER — Other Ambulatory Visit: Payer: Self-pay | Admitting: Internal Medicine

## 2021-04-17 DIAGNOSIS — J3089 Other allergic rhinitis: Secondary | ICD-10-CM | POA: Diagnosis not present

## 2021-04-17 DIAGNOSIS — J301 Allergic rhinitis due to pollen: Secondary | ICD-10-CM | POA: Diagnosis not present

## 2021-04-20 DIAGNOSIS — I10 Essential (primary) hypertension: Secondary | ICD-10-CM

## 2021-04-24 DIAGNOSIS — J3089 Other allergic rhinitis: Secondary | ICD-10-CM | POA: Diagnosis not present

## 2021-04-24 DIAGNOSIS — J301 Allergic rhinitis due to pollen: Secondary | ICD-10-CM | POA: Diagnosis not present

## 2021-05-01 DIAGNOSIS — J3089 Other allergic rhinitis: Secondary | ICD-10-CM | POA: Diagnosis not present

## 2021-05-01 DIAGNOSIS — J301 Allergic rhinitis due to pollen: Secondary | ICD-10-CM | POA: Diagnosis not present

## 2021-05-01 DIAGNOSIS — J3081 Allergic rhinitis due to animal (cat) (dog) hair and dander: Secondary | ICD-10-CM | POA: Diagnosis not present

## 2021-05-02 ENCOUNTER — Telehealth: Payer: Self-pay | Admitting: Internal Medicine

## 2021-05-02 NOTE — Telephone Encounter (Signed)
Spoke with patient spouse to scheduled AWV she req CB to schedule AWV ?

## 2021-05-03 DIAGNOSIS — J301 Allergic rhinitis due to pollen: Secondary | ICD-10-CM | POA: Diagnosis not present

## 2021-05-03 DIAGNOSIS — J3089 Other allergic rhinitis: Secondary | ICD-10-CM | POA: Diagnosis not present

## 2021-05-07 DIAGNOSIS — H353211 Exudative age-related macular degeneration, right eye, with active choroidal neovascularization: Secondary | ICD-10-CM | POA: Diagnosis not present

## 2021-05-08 DIAGNOSIS — J3089 Other allergic rhinitis: Secondary | ICD-10-CM | POA: Diagnosis not present

## 2021-05-08 DIAGNOSIS — J301 Allergic rhinitis due to pollen: Secondary | ICD-10-CM | POA: Diagnosis not present

## 2021-05-09 ENCOUNTER — Ambulatory Visit (INDEPENDENT_AMBULATORY_CARE_PROVIDER_SITE_OTHER): Payer: PPO | Admitting: *Deleted

## 2021-05-09 DIAGNOSIS — R296 Repeated falls: Secondary | ICD-10-CM

## 2021-05-09 DIAGNOSIS — R2689 Other abnormalities of gait and mobility: Secondary | ICD-10-CM

## 2021-05-09 DIAGNOSIS — I10 Essential (primary) hypertension: Secondary | ICD-10-CM

## 2021-05-09 NOTE — Patient Instructions (Addendum)
Visit Information ? ?Thank you for taking time to visit with me today. Please don't hesitate to contact me if I can be of assistance to you before our next scheduled telephone appointment. ? ?Following are the goals we discussed today:  ?(Take medications as prescribed   ?Attend all scheduled provider appointments ?check blood pressure 3 times per week ?write blood pressure results in a log or diary ?learn about high blood pressure ?keep a blood pressure log ?take blood pressure log to all doctor appointments ?call doctor for signs and symptoms of high blood pressure ?report new symptoms to your doctor ?Use walker with all ambulation ?Use your assistive devices ?Obtain BP cuff and begin to monitor ?  ?Our next appointment is by telephone on 5/31 at 1000 ? ?Please call the care guide team at [phone] if you need to cancel or reschedule your appointment.  ? ?If you are experiencing a Mental Health or [place] or need someone to talk to, please call the Suicide and Crisis Lifeline: 988 ?call the [place] National Suicide Prevention Lifeline: (613)610-8997 or TTY: (401)843-5620 TTY 781-145-0623) to talk to a trained counselor ?call 1-800-273-TALK (toll free, 24 hour hotline) ?call 911  ? ?Patient verbalizes understanding of instructions and care plan provided today and agrees to view in [facility]. Active MyChart status confirmed with patient.   ? ?[name] RN, MSN ?RN Care Management Coordinator ?[place] ?[phone] ?[email or web address] ? ?

## 2021-05-09 NOTE — Chronic Care Management (AMB) (Signed)
Chronic Care Management   CCM RN Visit Note  05/09/2021 Name: Todd Golden MRN: [id number] DOB: [birth year]  Subjective: Todd Golden is a 88 y.o. year old male who is a primary care patient of Darrick Huntsman, Mar Daring, MD. The care management team was consulted for assistance with disease management and care coordination needs.     for Engaged with patient by telephonefollow up visit in response to provider referral for case management and/or care coordination services.   Consent to Services:  The patient was given the following information about Chronic Care Management services today, agreed to services, and gave verbal consent: 1. CCM service includes personalized support from designated clinical staff supervised by the primary care provider, including individualized plan of care and coordination with other care providers 2. 24/7 contact phone numbers for assistance for urgent and routine care needs. 3. Service will only be billed when office clinical staff spend 20 minutes or more in a month to coordinate care. 4. Only one practitioner may furnish and bill the service in a calendar month. 5.The patient may stop CCM services at any time (effective at the end of the month) by phone call to the office staff. 6. The patient will be responsible for cost sharing (co-pay) of up to 20% of the service fee (after annual deductible is met). Patient agreed to services and consent obtained.  Patient agreed to services and verbal consent obtained.   Assessment: Review of patient past medical history, allergies, medications, health status, including review of consultants reports, laboratory and other test data, was performed as part of comprehensive evaluation and provision of chronic care management services.   SDOH (Social Determinants of Health) assessments and interventions performed:    CCM Care Plan  No Known Allergies  Outpatient Encounter Medications as of 05/09/2021  Medication Sig   amLODipine  (NORVASC) 5 MG tablet TAKE 1 TABLET(5 MG) BY MOUTH DAILY   aspirin EC 81 MG tablet Take 81 mg by mouth daily. Swallow whole.   citalopram (CELEXA) 10 MG tablet Take 1 tablet (10 mg total) by mouth daily.   donepezil (ARICEPT) 10 MG tablet Take 10 mg by mouth daily.   doxazosin (CARDURA) 4 MG tablet Take 1 tablet (4 mg total) by mouth daily.   EPINEPHrine 0.3 mg/0.3 mL IJ SOAJ injection Inject 0.3 mg into the muscle as needed for anaphylaxis.   hydrochlorothiazide (HYDRODIURIL) 25 MG tablet TAKE 1 TABLET(25 MG) BY MOUTH DAILY   latanoprost (XALATAN) 0.005 % ophthalmic solution Place 1 drop into both eyes at bedtime.   Multiple Vitamins-Minerals (PRESERVISION AREDS 2 PO) Take by mouth daily.   omeprazole (PRILOSEC) 40 MG capsule TAKE 1 CAPSULE(40 MG) BY MOUTH DAILY   pindolol (VISKEN) 10 MG tablet TAKE 1 TABLET(10 MG) BY MOUTH DAILY   simvastatin (ZOCOR) 20 MG tablet TAKE 1 TABLET BY MOUTH EVERY DAY   No facility-administered encounter medications on file as of 05/09/2021.    Patient Active Problem List   Diagnosis Date Noted   Allergic rhinitis 08/16/2020   Allergic rhinitis due to animal (cat) (dog) hair and dander 08/16/2020   Allergic rhinitis due to pollen 08/16/2020   Proximal leg weakness 08/03/2020   Hx of completed stroke 02/02/2020   Weakness 02/02/2020   Diffuse brain atrophy ([facility]) 12/09/2019   Balance problem 12/03/2019   Acquired thrombophilia ([facility]) 12/03/2019   Prediabetes 05/29/2019   Benign prostatic hyperplasia with urinary frequency 08/20/2018   Hearing aid worn 08/16/2017   Expressive aphasia 08/16/2017  History of cataract surgery 08/27/2016   Age-related macular degeneration 08/27/2016   Counseling regarding end of life decision making 08/27/2016   Do not resuscitate status 08/27/2016   BPH associated with nocturia 02/11/2015   PAF (paroxysmal atrial fibrillation) ([place]) 12/14/2013   Hyperlipidemia LDL goal <100 12/12/2013   CVD (cerebrovascular disease)  12/10/2013   GERD (gastroesophageal reflux disease) 07/25/2013   Bilateral hand numbness 07/25/2013   Chronic constipation 01/21/2013   Encounter for preventive health examination 01/21/2013   Essential hypertension, benign 10/21/2012   Glaucoma 10/21/2012   History of colonic polyps 10/21/2012   Ureteral stricture, right 10/21/2012    Conditions to be addressed/monitored:HTN and Dementia  Care Plan : [place] of Care (Adult)  Updates made by Leona Singleton, RN since 05/09/2021 12:00 AM     Problem: Limited comunication and increase falls related to previous stroke   Priority: Medium     Long-Range Goal: Wife will work with CCM team to help manage chronic medical conditions and decrease falls   Start Date: 01/01/2021  Expected End Date: 01/01/2022  Recent Progress: Not on track  Priority: Medium  Note:   Current Barriers:  Knowledge Deficits related to plan of care for management of HTN and Falls  Chronic Disease Management support and education needs related to HTN and Falls Cognitive Deficits  Patient with history of stroke with residual aphasia and history of dementia; spoke with wife who reports patient is doing well.  Wife concerned about increasing confusion; has upcoming Neurology appointment.  States communication is difficult related to the aphasia and confusion.  Does not monitor blood pressures frequently; agrees to start. Wife reports aphasia continues to worsen with the dementia, but she is still able to understand.  reports fall, Monday 3/6/2; while bending down to pick up crackers; no injury.  reports compliance with walker.  Still needs to get hone blood pressure cuff and will plan to obtain over the weekend. 4/19--Wife reports patient is doing well.  Ambulates with walker.  Did have a fall this past Monday, 4/17 while in room alone preparing for bed.  Per wife did not look like he hit his head or had any injuries; has not complained of any pain.  Neighbor  came and assisted off floor.  Still needs to obtain BP cuff.  Denies any pains, SOB, issues or complaints at this time.  RNCM Clinical Goal(s):  Patient will verbalize understanding of plan for management of HTN, Dementia, and Falls as evidenced by increasing communication with patient,decreasing falls demonstrate improved adherence to prescribed treatment plan for HTN and Dementia as evidenced by decreasing falls, monitoring blood pressures  through collaboration with RN Care manager, provider, and care team.   Interventions: 1:1 collaboration with primary care provider regarding development and update of comprehensive plan of care as evidenced by provider attestation and co-signature Inter-disciplinary care team collaboration (see longitudinal plan of care) Evaluation of current treatment plan related to  self management and patient's adherence to plan as established by provider  Falls:  (Status: Goal on track: NO.) Long Term Goal  Provided written and verbal education re: potential causes of falls and Fall prevention strategies Reviewed medications and discussed potential side effects of medications such as dizziness and frequent urination Advised patient of importance of notifying provider of falls Assessed for falls since last encounter Assessed patients knowledge of fall risk prevention secondary to previously provided education Assessed social determinant of health barriers Discussed need to reduce falls Encouraged increase use  of walker; congratulated on use of walker with ambulation Encouraged use of communication board as much as possible Fall precaution and preventions reviewed and discussed Encouraged use of assistive devices like reacher  Hypertension: (Status: Goal on track: NO.)  NEEDS TO OBTAIN BP CUFF Last practice recorded BP readings:  BP Readings from Last 3 Encounters:  11/17/20 122/70  11/02/20 138/88  09/14/20 121/73  Most recent eGFR/CrCl: No results found for:  EGFR  No components found for: CRCL  Evaluation of current treatment plan related to hypertension self management and patient's adherence to plan as established by provider;   Provided education to patient re: stroke prevention, s/s of heart attack and stroke; Reviewed prescribed diet low salt low fat Reviewed medications with patient and discussed importance of compliance;  Advised patient, providing education and rationale, to monitor blood pressure daily and record, calling PCP for findings outside established parameters;  Encouraged to obtain bp cuff and begin to monitor Encouraged to contact insurance company to verify assistance for BP cuff  Patient Goals/Self-Care Activities: Take medications as prescribed   Attend all scheduled provider appointments check blood pressure 3 times per week write blood pressure results in a log or diary learn about high blood pressure keep a blood pressure log take blood pressure log to all doctor appointments call doctor for signs and symptoms of high blood pressure report new symptoms to your doctor Use walker with all ambulation Use your assistive devices Obtain BP cuff and begin to monitor       Plan:The care management team will reach out to the patient again over the next 45 days.  [name] RN, MSN RN Care Management Coordinator [place] [phone] [email or web address]

## 2021-05-15 DIAGNOSIS — J3081 Allergic rhinitis due to animal (cat) (dog) hair and dander: Secondary | ICD-10-CM | POA: Diagnosis not present

## 2021-05-15 DIAGNOSIS — J301 Allergic rhinitis due to pollen: Secondary | ICD-10-CM | POA: Diagnosis not present

## 2021-05-15 DIAGNOSIS — J3089 Other allergic rhinitis: Secondary | ICD-10-CM | POA: Diagnosis not present

## 2021-05-17 ENCOUNTER — Ambulatory Visit: Payer: PPO | Admitting: Cardiovascular Disease

## 2021-05-17 ENCOUNTER — Encounter: Payer: Self-pay | Admitting: Cardiovascular Disease

## 2021-05-17 VITALS — BP 130/70 | HR 99 | Ht 71.0 in | Wt 184.1 lb

## 2021-05-17 DIAGNOSIS — I4821 Permanent atrial fibrillation: Secondary | ICD-10-CM | POA: Diagnosis not present

## 2021-05-17 DIAGNOSIS — E785 Hyperlipidemia, unspecified: Secondary | ICD-10-CM

## 2021-05-17 DIAGNOSIS — I10 Essential (primary) hypertension: Secondary | ICD-10-CM | POA: Diagnosis not present

## 2021-05-17 NOTE — Patient Instructions (Signed)

## 2021-05-17 NOTE — Progress Notes (Signed)
?  ?Cardiology Office Note ? ? ?Date:  05/17/2021  ? ?ID:  Todd Golden, DOB [birth year], MRN [id number] ? ?PCP:  Crecencio Mc, MD  ?Cardiologist:   Kathlyn Sacramento, MD  ? ?Chief Complaint  ?Patient presents with  ? Other  ?  6 month f/u no complaints today. Meds reviewed verbally with pt.  ? ? ?  ?History of Present Illness: ?Todd Golden is a 88 y.o. male who presents for a follow-up visit regarding permanent atrial fibrillation.  Other medical problems include previous stroke, hypertension, hyperlipidemia, and glaucoma.  In November 2015, he was admitted with expressive aphasia and mild ataxia due to left MCA stroke  in the setting of atrial fibrillation.  Echocardiogram showed normal LV function well carotid Doppler showed no obstructive disease.  He was placed on Eliquis. ? He moved out of [facility] with his wife and currently they live at the house and [place].   ? ?He was taken off anticoagulation with Eliquis due to recurrent falls.  He has been doing well with no chest pain, shortness of breath or palpitations.  He had a fall last month and does not remember the details of how it happened. ? ? ?Past Medical History:  ?Diagnosis Date  ? Allergy   ? Arthritis   ? Asthma   ? Cataract   ? Chronic constipation   ? Colon polyps   ? Cough   ? GERD (gastroesophageal reflux disease)   ? Glaucoma   ? HOH (hard of hearing)   ? a. Uses hearing aids  ? Hyperlipidemia   ? Hypertension   ? Macular degeneration   ? Nephrolithiasis   ? stent  ? PAF (paroxysmal atrial fibrillation) ([place])   ? a. 11/2013 Dx in setting of L MCA stroke. CHA2DS2VASc = 5-->Eliquis; b. 11/2013 Echo: Nl LV fxn. Mildly dil LA.  ? Stroke [facility])   ? a. 11/2013 L MCA stroke.  ? Ureteral stricture   ? ? ?Past Surgical History:  ?Procedure Laterality Date  ? CATARACT EXTRACTION W/PHACO Left 12/20/2014  ? Procedure: CATARACT EXTRACTION PHACO AND INTRAOCULAR LENS PLACEMENT (IOC);  Surgeon: Birder Robson, MD;  Location: [facility] ORS;  Service:  Ophthalmology;  Laterality: Left;   [place]     00:59.6 ?AP%    21.2 ?CDE    12.66 ?fluid pack lot #[id number]  ? JOINT REPLACEMENT Left 2014  ? knee partial replacement  ? kidney stent Right 1995  ? ? ? ?Current Outpatient Medications  ?Medication Sig Dispense Refill  ? amLODipine (NORVASC) 5 MG tablet TAKE 1 TABLET(5 MG) BY MOUTH DAILY 90 tablet 1  ? aspirin EC 81 MG tablet Take 81 mg by mouth daily. Swallow whole.    ? citalopram (CELEXA) 10 MG tablet Take 1 tablet (10 mg total) by mouth daily. 90 tablet 1  ? donepezil (ARICEPT) 10 MG tablet Take 10 mg by mouth daily.    ? doxazosin (CARDURA) 4 MG tablet Take 1 tablet (4 mg total) by mouth daily. 90 tablet 0  ? EPINEPHrine 0.3 mg/0.3 mL IJ SOAJ injection Inject 0.3 mg into the muscle as needed for anaphylaxis.    ? hydrochlorothiazide (HYDRODIURIL) 25 MG tablet TAKE 1 TABLET(25 MG) BY MOUTH DAILY 90 tablet 1  ? latanoprost (XALATAN) 0.005 % ophthalmic solution Place 1 drop into both eyes at bedtime.    ? Multiple Vitamins-Minerals (PRESERVISION AREDS 2 PO) Take by mouth daily.    ? omeprazole (PRILOSEC) 40 MG capsule TAKE 1 CAPSULE(40 MG)  BY MOUTH DAILY 90 capsule 3  ? pindolol (VISKEN) 10 MG tablet TAKE 1 TABLET(10 MG) BY MOUTH DAILY 90 tablet 0  ? simvastatin (ZOCOR) 20 MG tablet TAKE 1 TABLET BY MOUTH EVERY DAY 90 tablet 1  ? ?No current facility-administered medications for this visit.  ? ? ?Allergies:   Patient has no known allergies.  ? ? ?Social History:  The patient  reports that he has never smoked. He has never been exposed to tobacco smoke. He has never used smokeless tobacco. He reports current alcohol use. He reports that he does not use drugs.  ? ?Family History:  The patient's family history includes Arthritis in his mother; Cancer (age of onset: 41) in his mother; Stroke in his father.  ? ? ?ROS:  Please see the history of present illness.   Otherwise, review of systems are positive for none.   All other systems are reviewed and negative.  ? ? ?PHYSICAL  EXAM: ?VS:  BP 130/70 (BP Location: Left Arm, Patient Position: Sitting, Cuff Size: Normal)   Pulse 99   Ht '5\' 11"'$  (1.803 m)   Wt 184 lb 2 oz (83.5 kg)   SpO2 97%   BMI 25.68 kg/m?  , BMI Body mass index is 25.68 kg/m?. ?GEN: Well nourished, well developed, in no acute distress  ?HEENT: normal  ?Neck: no JVD, carotid bruits, or masses ?Cardiac: Irregularly irregular; no murmurs, rubs, or gallops,no edema  ?Respiratory:  clear to auscultation bilaterally, normal work of breathing ?GI: soft, nontender, nondistended, + BS ?MS: no deformity or atrophy  ?Skin: warm and dry, no rash ?Neuro:  Strength and sensation are intact ?Psych: euthymic mood, full affect ? ? ?EKG:  EKG is ordered today. ?The ekg ordered today demonstrates atrial fibrillation with ventricular rate of 99 bpm.  Minimal LVH with left anterior fascicular block. ? ? ?Recent Labs: ?08/02/2020: ALT 25; BUN 18; Creatinine, Ser 1.10; Potassium 4.0; Sodium 140  ? ? ?Lipid Panel ?   ?Component Value Date/Time  ? CHOL 133 12/18/2020 0819  ? CHOL 168 12/04/2013 0549  ? TRIG 99 12/18/2020 0819  ? TRIG 96 12/04/2013 0549  ? HDL 55 12/18/2020 0819  ? HDL 44 12/04/2013 0549  ? CHOLHDL 2.4 12/18/2020 0819  ? CHOLHDL 3 12/01/2019 0909  ? VLDL 17.8 12/01/2019 0909  ? VLDL 19 12/04/2013 0549  ? [place] 60 12/18/2020 0819  ? [place] 105 (H) 12/04/2013 0549  ? ?  ? ?Wt Readings from Last 3 Encounters:  ?05/17/21 184 lb 2 oz (83.5 kg)  ?11/17/20 187 lb 9.6 oz (85.1 kg)  ?11/02/20 190 lb 6.4 oz (86.4 kg)  ?  ? ? ? ? ?   ? View : No data to display.  ?  ?  ?  ? ? ? ? ?ASSESSMENT AND PLAN: ? ?1.  Permanent atrial fibrillation: This was documented in 2015 in the setting of left MCA stroke.  Ventricular rate is reasonably controlled with pindolol.  He is no longer on anticoagulation with Eliquis due to recurrent falls.  ? ?2.  Essential hypertension: Blood pressure is controlled on current medications.   ? ?3.  Hyperlipidemia: He is on simvastatin.  Most recent lipid profile  showed an LDL of 60 . ? ? ?Disposition:   FU with me in 6 months. ? ?Signed, ? ?Kathlyn Sacramento, MD  ?05/17/2021 10:00 AM    ?[place] ?

## 2021-05-22 DIAGNOSIS — J3089 Other allergic rhinitis: Secondary | ICD-10-CM | POA: Diagnosis not present

## 2021-05-22 DIAGNOSIS — J301 Allergic rhinitis due to pollen: Secondary | ICD-10-CM | POA: Diagnosis not present

## 2021-05-23 ENCOUNTER — Ambulatory Visit (INDEPENDENT_AMBULATORY_CARE_PROVIDER_SITE_OTHER): Payer: PPO

## 2021-05-23 VITALS — Ht 71.0 in | Wt 184.0 lb

## 2021-05-23 DIAGNOSIS — Z0000 Encounter for general adult medical examination without abnormal findings: Secondary | ICD-10-CM

## 2021-05-23 NOTE — Progress Notes (Addendum)
Subjective:   Todd Golden is a 88 y.o. male who presents for Medicare Annual/Subsequent preventive examination.  Review of Systems    No ROS.  Medicare Wellness Virtual Visit.  Visual/audio telehealth visit, UTA vital signs.   See social history for additional risk factors.   Cardiac Risk Factors include: advanced age (>74mn, >>78women);male gender     Objective:    Today's Vitals   05/23/21 1221  Weight: 184 lb (83.5 kg)  Height: '5\' 11"'$  (1.803 m)   Body mass index is 25.66 kg/m.     05/23/2021   12:29 PM 01/01/2021    2:34 PM 11/14/2020   10:10 AM 02/17/2020    2:47 PM 09/23/2019    9:10 AM 02/12/2018    1:28 PM 02/11/2017    3:04 PM  Advanced Directives  Does Patient Have a Medical Advance Directive? Yes Yes Yes No Yes Yes Yes  Type of AParamedicof AY[place]Living will HRiceLiving will HCaliforniaLiving will  Out of facility DNR (pink MOST or yellow form);HLa Paloma AdditionLiving will HWest DennisLiving will HBlue PointLiving will;Out of facility DNR (pink MOST or yellow form)  Does patient want to make changes to medical advance directive? No - Patient declined No - Patient declined No - Guardian declined  No - Patient declined No - Patient declined No - Patient declined  Copy of HBrowntownin Chart? Yes - validated most recent copy scanned in chart (See row information) No - copy requested No - copy requested  Yes - validated most recent copy scanned in chart (See row information) Yes - validated most recent copy scanned in chart (See row information) Yes  Would patient like information on creating a medical advance directive?    No - Patient declined       Current Medications (verified) Outpatient Encounter Medications as of 05/23/2021  Medication Sig   amLODipine (NORVASC) 5 MG tablet TAKE 1 TABLET(5 MG) BY MOUTH DAILY   aspirin EC 81 MG  tablet Take 81 mg by mouth daily. Swallow whole.   citalopram (CELEXA) 10 MG tablet Take 1 tablet (10 mg total) by mouth daily.   donepezil (ARICEPT) 10 MG tablet Take 10 mg by mouth daily.   doxazosin (CARDURA) 4 MG tablet Take 1 tablet (4 mg total) by mouth daily.   EPINEPHrine 0.3 mg/0.3 mL IJ SOAJ injection Inject 0.3 mg into the muscle as needed for anaphylaxis.   hydrochlorothiazide (HYDRODIURIL) 25 MG tablet TAKE 1 TABLET(25 MG) BY MOUTH DAILY   latanoprost (XALATAN) 0.005 % ophthalmic solution Place 1 drop into both eyes at bedtime.   Multiple Vitamins-Minerals (PRESERVISION AREDS 2 PO) Take by mouth daily.   omeprazole (PRILOSEC) 40 MG capsule TAKE 1 CAPSULE(40 MG) BY MOUTH DAILY   pindolol (VISKEN) 10 MG tablet TAKE 1 TABLET(10 MG) BY MOUTH DAILY   simvastatin (ZOCOR) 20 MG tablet TAKE 1 TABLET BY MOUTH EVERY DAY   No facility-administered encounter medications on file as of 05/23/2021.    Allergies (verified) Patient has no known allergies.   History: Past Medical History:  Diagnosis Date   Allergy    Arthritis    Asthma    Cataract    Chronic constipation    Colon polyps    Cough    GERD (gastroesophageal reflux disease)    Glaucoma    HOH (hard of hearing)    a. Uses hearing aids  Hyperlipidemia    Hypertension    Macular degeneration    Nephrolithiasis    stent   PAF (paroxysmal atrial fibrillation) ([place])    a. 11/2013 Dx in setting of L MCA stroke. CHA2DS2VASc = 5-->Eliquis; b. 11/2013 Echo: Nl LV fxn. Mildly dil LA.   Stroke [facility])    a. 11/2013 L MCA stroke.   Ureteral stricture    Past Surgical History:  Procedure Laterality Date   CATARACT EXTRACTION W/PHACO Left 12/20/2014   Procedure: CATARACT EXTRACTION PHACO AND INTRAOCULAR LENS PLACEMENT (IOC);  Surgeon: Birder Robson, MD;  Location: [facility] ORS;  Service: Ophthalmology;  Laterality: Left;   [place]     00:59.6 AP%    21.2 CDE    12.66 fluid pack lot #[id number]   JOINT REPLACEMENT Left 2014   knee  partial replacement   kidney stent Right 1995   Family History  Problem Relation Age of Onset   Arthritis Mother    Cancer Mother 64       kidney   Stroke Father    Social History   Socioeconomic History   Marital status: Married    Spouse name: Farhad Burleson   Number of children: 3   Years of education: 12   Highest education level: 12th grade  Occupational History   Not on file  Tobacco Use   Smoking status: Never    Passive exposure: Never   Smokeless tobacco: Never  Vaping Use   Vaping Use: Never used  Substance and Sexual Activity   Alcohol use: Yes    Comment: rarely   Drug use: No   Sexual activity: Not Currently  Other Topics Concern   Not on file  Social History Narrative   Not on file   Social Determinants of Health   Financial Resource Strain: Low Risk    Difficulty of Paying Living Expenses: Not hard at all  Food Insecurity: No Food Insecurity   Worried About Charity fundraiser in the Last Year: Never true   [place] in the Last Year: Never true  Transportation Needs: No Transportation Needs   Lack of Transportation (Medical): No   Lack of Transportation (Non-Medical): No  Physical Activity: Sufficiently Active   Days of Exercise per Week: 4 days   Minutes of Exercise per Session: 50 min  Stress: No Stress Concern Present   Feeling of Stress : Not at all  Social Connections: Moderately Integrated   Frequency of Communication with Friends and Family: More than three times a week   Frequency of Social Gatherings with Friends and Family: More than three times a week   Attends Religious Services: More than 4 times per year   Active Member of [employer] or Organizations: No   Attends Archivist Meetings: Never   Marital Status: Married    Tobacco Counseling Counseling given: Not Answered   Clinical Intake:  Pre-visit preparation completed: Yes        Diabetes: No  How often do you need to have someone help you when you read  instructions, pamphlets, or other written materials from your doctor or pharmacy?: 4 - Often  Interpreter Needed?: No      Activities of Daily Living    05/23/2021   12:31 PM 11/14/2020   10:07 AM  In your present state of health, do you have any difficulty performing the following activities:  Hearing? 1 1  Comment Hearing aids Hard of Hearing; Hearing Loss - Wears Hearing Aides  Vision?  1 1  Comment Macular degeneration Glaucoma; Age-Related Macular Degeneration  Difficulty concentrating or making decisions? 1 0  Walking or climbing stairs? 1 1  Comment Walker in use Balance Problems and Gait Disturbance  Dressing or bathing?  0  Comment Wife assist as needed   Doing errands, shopping? 1 0  Preparing Food and eating ? Y N  Comment Wife assist meal prep. Self feeds.   Using the Toilet? N N  In the past six months, have you accidently leaked urine? N N  Do you have problems with loss of bowel control? N N  Managing your Medications? Y N  Comment Wife assist meal prep.   Managing your Finances? Y N  Comment Wife assist meal prep.   Housekeeping or managing your Housekeeping? Y N  Comment Wife assist meal prep.     Patient Care Team: Crecencio Mc, MD as PCP - General (Internal Medicine) Wellington Hampshire, MD as PCP - Cardiology (Cardiology) Leona Singleton, RN as Case Manager  Indicate any recent Medical Services you may have received from other than [facility] providers in the past year (date may be approximate).     Assessment:   This is a routine wellness examination for Rohit.  Virtual Visit via Telephone Note  I connected with  [name] on 05/23/21 at  9:45 AM EDT by telephone and verified that I am speaking with the correct person using two identifiers.  Persons participating in the virtual visit: patient/wife/Nurse Health Advisor   I discussed the limitations of performing an evaluation and management service by telehealth. The patient expressed  understanding and agreed to proceed. We continued and completed visit with audio only. Some vital signs may be absent or patient reported.   Hearing/Vision screen Hearing Screening - Comments:: Hearing aids Vision Screening - Comments:: Followed by [facility]  Wears corrective lenses  Glaucoma; drops in use Macular degeneration  Visits every 6-8 weeks  Cataract extraction, bilateral  They have regular follow up with the ophthalmologist  Dietary issues and exercise activities discussed: Current Exercise Habits: Home exercise routine, Intensity: Mild Regular diet   Goals Addressed               This Visit's Progress     Patient Stated     DIET - REDUCE PORTION SIZE (pt-stated)   On track     Healthy diet        Depression Screen    05/23/2021   12:28 PM 01/01/2021    2:41 PM 11/14/2020   10:04 AM 11/02/2020   10:38 AM 09/23/2019    9:11 AM 05/27/2019    9:24 AM 02/12/2018    1:33 PM  PHQ 2/9 Scores  PHQ - 2 Score 0  0 0 0 3 0  PHQ- 9 Score      6   Exception Documentation  Other- indicate reason in comment box Other- indicate reason in comment box      Not completed  did not speak with patient Verified by Wife - Cordel Drewes        Fall Risk    05/23/2021   12:30 PM 01/01/2021    2:40 PM 11/14/2020   10:07 AM 11/02/2020   10:38 AM 12/01/2019    8:38 AM  Fall Risk   Falls in the past year? '1 1 1 1 1  '$ Number falls in past yr: '1 1 1 1 '$ 0  Injury with Fall? 0 0 1 1 0  Risk for fall due to : History of fall(s) History of fall(s);Impaired vision;Impaired balance/gait;Medication side effect;Mental status change;Impaired mobility History of fall(s);Impaired balance/gait;Impaired mobility;Impaired vision History of fall(s) Impaired balance/gait  Follow up Falls evaluation completed Falls evaluation completed;Education provided;Falls prevention discussed Falls evaluation completed;Education provided;Falls prevention discussed Falls evaluation completed Falls  evaluation completed    FALL RISK PREVENTION PERTAINING TO THE HOME:  Home free of loose throw rugs in walkways, pet beds, electrical cords, etc? Yes  Adequate lighting in your home to reduce risk of falls? Yes   ASSISTIVE DEVICES UTILIZED TO PREVENT FALLS: Use of a cane, walker or w/c? Yes   TIMED UP AND GO: Was the test performed? No .   Cognitive Function: Patient is alert.  Taking Aricept as directed.     02/12/2016    3:43 PM 02/10/2015    8:42 AM  MMSE - Mini Mental State Exam  Orientation to time 5 5  Orientation to Place 5 5  Registration 3 3  Attention/ Calculation 5 5  Recall 3 3  Language- name 2 objects 2 2  Language- repeat 1 1  Language- follow 3 step command 3 3  Language- read & follow direction 1 1  Write a sentence 1 1  Copy design 1 1  Total score 30 30        09/23/2019    9:13 AM 02/12/2018    1:38 PM 02/11/2017    3:36 PM  6CIT Screen  What Year? 0 points 0 points 0 points  What month? 0 points 0 points 0 points  What time?  0 points 0 points  Count back from 20  0 points 0 points  Months in reverse 0 points 0 points 0 points  Repeat phrase  0 points 0 points  Total Score  0 points 0 points    Immunizations Immunization History  Administered Date(s) Administered   Fluad Quad(high Dose 65+) 11/02/2020   Influenza,inj,Quad PF,6+ Mos 10/21/2012   Influenza-Unspecified 11/09/2013, 11/18/2014, 11/10/2015, 11/20/2016, 11/19/2017, 11/08/2018, 11/20/2019   Moderna Sars-Covid-2 Vaccination 02/23/2019, 03/23/2019, 11/20/2019   Pneumococcal Conjugate-13 12/10/2013   Pneumococcal Polysaccharide-23 10/21/2009, 02/08/2015, 04/27/2020   Tdap 06/12/2013   Zoster Recombinat (Shingrix) 09/22/2020, 12/21/2020   Zoster, Live 09/27/2015   Screening Tests Health Maintenance  Topic Date Due   COVID-19 Vaccine (4 - Booster for Moderna series) 06/08/2021 (Originally 01/15/2020)   INFLUENZA VACCINE  08/21/2021   TETANUS/TDAP  06/13/2023   Pneumonia Vaccine  68+ Years old  Completed   Zoster Vaccines- Shingrix  Completed   HPV VACCINES  Aged Out   Health Maintenance There are no preventive care reminders to display for this patient.  Lung Cancer Screening: (Low Dose CT Chest recommended if Age 33-80 years, 30 pack-year currently smoking OR have quit w/in 15years.) does not qualify.   Hepatitis C Screening: does not  Vision Screening: Recommended annual ophthalmology exams for early detection of glaucoma and other disorders of the eye.  Dental Screening: Recommended annual dental exams for proper oral hygiene.  Community Resource Referral / Chronic Care Management: CRR required this visit?  No   CCM required this visit?  No      Plan:   Keep all routine maintenance appointments.   I have personally reviewed and noted the following in the patient's chart:   Medical and social history Use of alcohol, tobacco or illicit drugs  Current medications and supplements including opioid prescriptions. Patient is not currently taking opioid prescriptions. Functional ability and status Nutritional status  Physical activity Advanced directives List of other physicians Hospitalizations, surgeries, and ER visits in previous 12 months Vitals Screenings to include cognitive, depression, and falls Referrals and appointments  In addition, I have reviewed and discussed with patient certain preventive protocols, quality metrics, and best practice recommendations. A written personalized care plan for preventive services as well as general preventive health recommendations were provided to patient.     OBrien-Blaney, Tamarra Geiselman L, LPN   06/30/2491     I have reviewed the above information and agree with above.   Deborra Medina, MD

## 2021-05-23 NOTE — Patient Instructions (Addendum)
?  Mr. Klingel , ?Thank you for taking time to come for your Medicare Wellness Visit. I appreciate your ongoing commitment to your health goals. Please review the following plan we discussed and let me know if I can assist you in the future.  ? ?These are the goals we discussed: ? Goals   ? ?  ? Patient Stated  ?   DIET - REDUCE PORTION SIZE (pt-stated)   ?   Healthy diet ? ?  ? ?  ?  ?This is a list of the screening recommended for you and due dates:  ?Health Maintenance  ?Topic Date Due  ? COVID-19 Vaccine (4 - Booster for Moderna series) 06/08/2021*  ? Flu Shot  08/21/2021  ? Tetanus Vaccine  06/13/2023  ? Pneumonia Vaccine  Completed  ? Zoster (Shingles) Vaccine  Completed  ? HPV Vaccine  Aged Out  ?*Topic was postponed. The date shown is not the original due date.  ?  ?

## 2021-05-25 ENCOUNTER — Other Ambulatory Visit: Payer: Self-pay | Admitting: Internal Medicine

## 2021-05-29 ENCOUNTER — Other Ambulatory Visit: Payer: Self-pay

## 2021-05-29 DIAGNOSIS — J301 Allergic rhinitis due to pollen: Secondary | ICD-10-CM | POA: Diagnosis not present

## 2021-05-29 DIAGNOSIS — J3089 Other allergic rhinitis: Secondary | ICD-10-CM | POA: Diagnosis not present

## 2021-05-29 MED ORDER — SIMVASTATIN 20 MG PO TABS: 20.0000 mg | ORAL_TABLET | Freq: Every day | ORAL | 3 refills | Status: DC

## 2021-06-05 DIAGNOSIS — J301 Allergic rhinitis due to pollen: Secondary | ICD-10-CM | POA: Diagnosis not present

## 2021-06-05 DIAGNOSIS — J3089 Other allergic rhinitis: Secondary | ICD-10-CM | POA: Diagnosis not present

## 2021-06-12 DIAGNOSIS — J3089 Other allergic rhinitis: Secondary | ICD-10-CM | POA: Diagnosis not present

## 2021-06-12 DIAGNOSIS — J301 Allergic rhinitis due to pollen: Secondary | ICD-10-CM | POA: Diagnosis not present

## 2021-06-19 DIAGNOSIS — J301 Allergic rhinitis due to pollen: Secondary | ICD-10-CM | POA: Diagnosis not present

## 2021-06-19 DIAGNOSIS — J3089 Other allergic rhinitis: Secondary | ICD-10-CM | POA: Diagnosis not present

## 2021-06-20 ENCOUNTER — Telehealth: Payer: Self-pay | Admitting: *Deleted

## 2021-06-20 ENCOUNTER — Other Ambulatory Visit: Payer: Self-pay

## 2021-06-20 ENCOUNTER — Telehealth: Payer: PPO

## 2021-06-20 MED ORDER — CITALOPRAM HYDROBROMIDE 10 MG PO TABS: 10.0000 mg | ORAL_TABLET | Freq: Every day | ORAL | 1 refills | Status: DC

## 2021-06-20 NOTE — Telephone Encounter (Unsigned)
  Care Management   Follow Up Note   06/20/2021 Name: Todd Golden MRN: [id number] DOB: 1930/10/03   Referred by: Crecencio Mc, MD Reason for referral : Chronic Care Management (HTN, FALLS)   An unsuccessful telephone outreach was attempted today. The patient was referred to the case management team for assistance with care management and care coordination.   Follow Up Plan: RNCM will seek assistance from Care Guides in rescheduling appointment within the next 30 days.  [name] RN, MSN RN Care Management Coordinator [place] [phone] [name]

## 2021-06-26 ENCOUNTER — Telehealth: Payer: Self-pay

## 2021-06-26 DIAGNOSIS — J301 Allergic rhinitis due to pollen: Secondary | ICD-10-CM | POA: Diagnosis not present

## 2021-06-26 DIAGNOSIS — J3089 Other allergic rhinitis: Secondary | ICD-10-CM | POA: Diagnosis not present

## 2021-06-26 NOTE — Chronic Care Management (AMB) (Signed)
  Chronic Care Management Note  06/26/2021 Name: Todd Golden MRN: [id number] DOB: 10-17-1930  Todd Golden is a 88 y.o. year old male who is a primary care patient of Derrel Nip, Aris Everts, MD and is actively engaged with the care management team. I reached out to [name] by phone today to assist with re-scheduling a follow up visit with the RN Case Manager  Follow up plan: Unsuccessful telephone outreach attempt made. A HIPAA compliant phone message was left for the patient providing contact information and requesting a return call.  The care management team will reach out to the patient again over the next 7 days.  If patient returns call to provider office, please advise to call [facility]  at [place], [place], [place], [place] [address] Direct Dial: [phone] [email or web address]: [email or web address]

## 2021-07-03 DIAGNOSIS — J301 Allergic rhinitis due to pollen: Secondary | ICD-10-CM | POA: Diagnosis not present

## 2021-07-03 DIAGNOSIS — J3089 Other allergic rhinitis: Secondary | ICD-10-CM | POA: Diagnosis not present

## 2021-07-10 DIAGNOSIS — J3089 Other allergic rhinitis: Secondary | ICD-10-CM | POA: Diagnosis not present

## 2021-07-10 DIAGNOSIS — J301 Allergic rhinitis due to pollen: Secondary | ICD-10-CM | POA: Diagnosis not present

## 2021-07-10 NOTE — Chronic Care Management (AMB) (Signed)
  Chronic Care Management Note  07/10/2021 Name: Todd Golden MRN: [id number] DOB: 16-Aug-1930  Todd Golden is a 88 y.o. year old male who is a primary care patient of Derrel Nip, Aris Everts, MD and is actively engaged with the care management team. I reached out to [name] by phone today to assist with re-scheduling a follow up visit with the RN Case Manager  Follow up plan: Telephone appointment with care management team member scheduled for:07/25/2021  Noreene Larsson, [place], [place],  [address] Direct Dial: [phone] [email or web address]

## 2021-07-12 ENCOUNTER — Emergency Department: Payer: PPO

## 2021-07-12 ENCOUNTER — Other Ambulatory Visit: Payer: Self-pay

## 2021-07-12 ENCOUNTER — Emergency Department
Admission: EM | Admit: 2021-07-12 | Discharge: 2021-07-13 | Disposition: A | Discharge status: Home / Self Care | Payer: PPO | Attending: Student in an Organized Health Care Education/Training Program | Admitting: Student in an Organized Health Care Education/Training Program

## 2021-07-12 DIAGNOSIS — Z131 Encounter for screening for diabetes mellitus: Secondary | ICD-10-CM | POA: Insufficient documentation

## 2021-07-12 DIAGNOSIS — F0390 Unspecified dementia without behavioral disturbance: Secondary | ICD-10-CM | POA: Insufficient documentation

## 2021-07-12 DIAGNOSIS — W1839XA Other fall on same level, initial encounter: Secondary | ICD-10-CM | POA: Insufficient documentation

## 2021-07-12 DIAGNOSIS — I4821 Permanent atrial fibrillation: Secondary | ICD-10-CM | POA: Insufficient documentation

## 2021-07-12 DIAGNOSIS — I517 Cardiomegaly: Secondary | ICD-10-CM | POA: Diagnosis not present

## 2021-07-12 DIAGNOSIS — M542 Cervicalgia: Secondary | ICD-10-CM | POA: Diagnosis not present

## 2021-07-12 DIAGNOSIS — I69320 Aphasia following cerebral infarction: Secondary | ICD-10-CM | POA: Diagnosis not present

## 2021-07-12 DIAGNOSIS — M19012 Primary osteoarthritis, left shoulder: Secondary | ICD-10-CM | POA: Diagnosis not present

## 2021-07-12 DIAGNOSIS — R519 Headache, unspecified: Secondary | ICD-10-CM | POA: Diagnosis not present

## 2021-07-12 DIAGNOSIS — Y9301 Activity, walking, marching and hiking: Secondary | ICD-10-CM | POA: Diagnosis not present

## 2021-07-12 DIAGNOSIS — Y92000 Kitchen of unspecified non-institutional (private) residence as  the place of occurrence of the external cause: Secondary | ICD-10-CM | POA: Diagnosis not present

## 2021-07-12 DIAGNOSIS — M19011 Primary osteoarthritis, right shoulder: Secondary | ICD-10-CM | POA: Diagnosis not present

## 2021-07-12 DIAGNOSIS — S0083XA Contusion of other part of head, initial encounter: Secondary | ICD-10-CM

## 2021-07-12 DIAGNOSIS — R58 Hemorrhage, not elsewhere classified: Secondary | ICD-10-CM | POA: Diagnosis not present

## 2021-07-12 DIAGNOSIS — I10 Essential (primary) hypertension: Secondary | ICD-10-CM | POA: Diagnosis not present

## 2021-07-12 DIAGNOSIS — S0181XA Laceration without foreign body of other part of head, initial encounter: Secondary | ICD-10-CM

## 2021-07-12 DIAGNOSIS — R000 Tachycardia, unspecified: Secondary | ICD-10-CM | POA: Diagnosis not present

## 2021-07-12 DIAGNOSIS — R296 Repeated falls: Secondary | ICD-10-CM | POA: Diagnosis not present

## 2021-07-12 DIAGNOSIS — W19XXXA Unspecified fall, initial encounter: Secondary | ICD-10-CM | POA: Diagnosis not present

## 2021-07-12 DIAGNOSIS — S0993XA Unspecified injury of face, initial encounter: Secondary | ICD-10-CM | POA: Diagnosis present

## 2021-07-12 DIAGNOSIS — Z043 Encounter for examination and observation following other accident: Secondary | ICD-10-CM | POA: Diagnosis not present

## 2021-07-12 LAB — CBC WITH DIFFERENTIAL/PLATELET
Abs Immature Granulocytes: 0.03 10*3/uL (ref 0.00–0.07)
Basophils Absolute: 0 10*3/uL (ref 0.0–0.1)
Basophils Relative: 0 %
Eosinophils Absolute: 0.3 10*3/uL (ref 0.0–0.5)
Eosinophils Relative: 4 %
HCT: 42.7 % (ref 39.0–52.0)
Hemoglobin: 13.8 g/dL (ref 13.0–17.0)
Immature Granulocytes: 0 %
Lymphocytes Relative: 32 %
Lymphs Abs: 2.5 10*3/uL (ref 0.7–4.0)
MCH: 30.1 pg (ref 26.0–34.0)
MCHC: 32.3 g/dL (ref 30.0–36.0)
MCV: 93.2 fL (ref 80.0–100.0)
Monocytes Absolute: 1 10*3/uL (ref 0.1–1.0)
Monocytes Relative: 13 %
Neutro Abs: 4 10*3/uL (ref 1.7–7.7)
Neutrophils Relative %: 51 %
Platelets: 250 10*3/uL (ref 150–400)
RBC: 4.58 MIL/uL (ref 4.22–5.81)
RDW: 13.7 % (ref 11.5–15.5)
WBC: 7.9 10*3/uL (ref 4.0–10.5)
nRBC: 0 % (ref 0.0–0.2)

## 2021-07-12 LAB — COMPREHENSIVE METABOLIC PANEL
ALT: 24 U/L (ref 0–44)
AST: 29 U/L (ref 15–41)
Albumin: 3.9 g/dL (ref 3.5–5.0)
Alkaline Phosphatase: 52 U/L (ref 38–126)
Anion gap: 9 (ref 5–15)
BUN: 24 mg/dL — ABNORMAL HIGH (ref 8–23)
CO2: 28 mmol/L (ref 22–32)
Calcium: 9.1 mg/dL (ref 8.9–10.3)
Chloride: 101 mmol/L (ref 98–111)
Creatinine, Ser: 1.18 mg/dL (ref 0.61–1.24)
GFR, Estimated: 59 mL/min — ABNORMAL LOW (ref >60)
Glucose, Bld: 109 mg/dL — ABNORMAL HIGH (ref 70–99)
Potassium: 4.2 mmol/L (ref 3.5–5.1)
Sodium: 138 mmol/L (ref 135–145)
Total Bilirubin: 0.6 mg/dL (ref 0.3–1.2)
Total Protein: 7.1 g/dL (ref 6.5–8.1)

## 2021-07-12 LAB — CBG MONITORING, ED: Glucose-Capillary: 119 mg/dL — ABNORMAL HIGH (ref 70–99)

## 2021-07-12 LAB — TROPONIN I (HIGH SENSITIVITY): Troponin I (High Sensitivity): 11 ng/L (ref <18)

## 2021-07-12 NOTE — ED Provider Notes (Signed)
[facility] Provider Note    Event Date/Time   First MD Initiated Contact with Patient 07/12/21 2254     (approximate)   History   Fall and Head Laceration   HPI Level 5 caveat: History is limited by the patient's chronic aphasia status post CVA and reportedly having mild dementia   Todd Golden. is a 88 y.o. male status post MCA stroke with some chronic deficits including expressive aphasia and difficulty with ambulation.  He presents by EMS after a fall at home.  His wife showed up shortly afterwards and provided the majority of the history.  He walks with a walker most of the time but sometimes does not use it.  Tonight he went to the kitchen to make himself some food without his walker and had what appears to be a mechanical fall.  He struck his face and forehead on a hard object and has a laceration on his forehead and the bridge of his nose.  He did not lose consciousness and he denies any pain at this time except for some soreness in his face.  He is in good spirits and spite of his injuries and says he had a good day today.  His wife agrees and said that his day was normal, he has not been showing any signs or symptoms of illness, and that this is not unusual for him.    He has a well-documented history of "permanent atrial fibrillation" and his cardiologist is Dr. Fletcher Anon.  His wife confirmed the diagnosis as well, and also confirmed that he was taken off of Eliquis more than a year ago because of his frequent falls.       Physical Exam   Triage Vital Signs: ED Triage Vitals  Enc Vitals Group     BP 07/12/21 2218 (!) 143/100     Pulse Rate 07/12/21 2218 (!) 115     Resp 07/12/21 2218 18     Temp 07/12/21 2218 98.2 F (36.8 C)     Temp Source 07/12/21 2218 Oral     SpO2 07/12/21 2218 98 %     Weight 07/12/21 2219 88.3 kg (194 lb 10.7 oz)     Height 07/12/21 2219 1.803 m ('5\' 11"'$ )     Head Circumference --      Peak Flow --      Pain Score  07/12/21 2219 0     Pain Loc --      Pain Edu? --      Excl. in [place]? --     Most recent vital signs: Vitals:   07/12/21 2300 07/13/21 0000  BP: (!) 148/100 (!) 134/92  Pulse: 79 95  Resp: 18 17  Temp:    SpO2: 98% 94%     General: Awake, no distress.  Obvious trauma to the patient's face. CV:  Good peripheral perfusion.  Normal heart sounds but with a regular rhythm.  Mild tachycardia initially, now resolved with heart rate in the 70s. Resp:  Normal effort.  Lungs are clear to auscultation bilaterally. Abd:  No distention.  No tenderness to palpation. MSK:  The patient is moving all 4 extremities without difficulty.  His wife confirms that his neurological status appears to be at his baseline, with frequent word finding difficulties but nothing abnormal for him. Other:  Patient has a 2-cm vertical laceration to the center of his forehead, and a smaller V-shaped superficial laceration (1-cm total) to the bridge of his nose.  His nose is slightly erythematous and appears somewhat swollen.   ED Results / Procedures / Treatments   Labs (all labs ordered are listed, but only abnormal results are displayed) Labs Reviewed  COMPREHENSIVE METABOLIC PANEL - Abnormal; Notable for the following components:      Result Value   Glucose, Bld 109 (*)    BUN 24 (*)    GFR, Estimated 59 (*)    All other components within normal limits  CBG MONITORING, ED - Abnormal; Notable for the following components:   Glucose-Capillary 119 (*)    All other components within normal limits  CBC WITH DIFFERENTIAL/PLATELET  TROPONIN I (HIGH SENSITIVITY)     EKG  ED ECG REPORT I, Hinda Kehr, the attending physician, personally viewed and interpreted this ECG.  Date: 07/12/2021 EKG Time: 22: 17 Rate: 121 Rhythm: A-fib with RVR QRS Axis: LAD Intervals: Abnormal due to A-fib with slightly prolonged QTc at 533 ms.  Question of left anterior fascicular block. ST/T Wave abnormalities: Non-specific ST  segment / T-wave changes, but no clear evidence of acute ischemia. Narrative Interpretation: no definitive evidence of acute ischemia; does not meet STEMI criteria.    RADIOLOGY As documented in hospital course, no acute fractures, intracranial hemorrhage, or other traumatic abnormalities identified on CT scans and chest x-ray.    PROCEDURES:  Critical Care performed: No  .1-3 Lead EKG Interpretation  Performed by: Hinda Kehr, MD Authorized by: Hinda Kehr, MD     Interpretation: abnormal     ECG rate:  80   ECG rate assessment: normal     Rhythm: atrial fibrillation     Ectopy: none     Conduction: normal   ..Laceration Repair  Date/Time: 07/13/2021 1:15 AM  Performed by: Hinda Kehr, MD Authorized by: Hinda Kehr, MD   Consent:    Consent obtained:  Verbal   Consent given by:  Patient and spouse   Risks discussed:  Infection, pain and poor cosmetic result Universal protocol:    Patient identity confirmed:  Verbally with patient and arm band Anesthesia:    Anesthesia method:  None Laceration details:    Location:  Face   Face location:  Forehead   Length (cm):  2 Exploration:    Contaminated: no   Treatment:    Amount of cleaning:  Standard   Irrigation solution:  Sterile saline   Visualized foreign bodies/material removed: no   Skin repair:    Repair method: 1 large Derma-Clip. Approximation:    Approximation:  Close Repair type:    Repair type:  Simple Post-procedure details:    Dressing:  Open (no dressing)   Procedure completion:  Tolerated well, no immediate complications .Marland KitchenLaceration Repair  Date/Time: 07/13/2021 1:16 AM  Performed by: Hinda Kehr, MD Authorized by: Hinda Kehr, MD   Consent:    Consent obtained:  Verbal   Consent given by:  Patient and spouse   Risks discussed:  Infection, poor cosmetic result and pain Anesthesia:    Anesthesia method:  None Laceration details:    Location:  Face   Face location:  Nose    Length (cm):  1 Exploration:    Contaminated: no   Treatment:    Amount of cleaning:  Standard   Irrigation solution:  Sterile saline   Visualized foreign bodies/material removed: no   Skin repair:    Repair method:  Tissue adhesive and Steri-Strips   Number of Steri-Strips:  1 Approximation:    Approximation:  Close Repair type:  Repair type:  Simple Post-procedure details:    Dressing:  Open (no dressing)   Procedure completion:  Tolerated well, no immediate complications    MEDICATIONS ORDERED IN ED: Medications  acetaminophen (TYLENOL) tablet 1,000 mg (has no administration in time range)     IMPRESSION / MDM / ASSESSMENT AND PLAN / ED COURSE  I reviewed the triage vital signs and the nursing notes.                              Differential diagnosis includes, but is not limited to, mechanical fall, intracranial bleed such as subdural or subarachnoid hemorrhage due to trauma, facial fractures, acute illness leading to delirium, electrolyte or metabolic abnormality, acute CVA.  Patient's presentation is most consistent with acute presentation with potential threat to life or bodily function.  Orders: CT head, CT cervical spine, CT maxillofacial, portable chest x-ray 1 view, CBG, comprehensive metabolic panel, high-sensitivity troponin, CBC with differential.  The patient is on the cardiac monitor to evaluate for evidence of arrhythmia and/or significant heart rate changes.  In spite of his fall and injuries, patient is in no distress and seems to be in good spirits.  His wife states this is basically normal for him, he is at his baseline, and they both agree that they do not want to do unnecessary studies and that he does not want to stay in the hospital unless it is absolutely necessary.  Interpreted CBG which is essentially normal at 119.  CBC is also within normal limits with no leukocytosis and a normal hemoglobin.  He has not had any urinary symptoms recently and no  signs or symptoms of infection so we will not check a urinalysis.  The other labs are pending and his imaging is pending.  I will reassess and address his lacerations after the imaging is back.   Clinical Course as of 07/13/21 0117  Thu Jul 12, 2021  2326 DG Chest Portable 1 View I viewed and interpreted the patient's one-view portable chest x-ray and see no sign of acute infection such as pneumonia and no evidence of pneumothorax.  Radiology agrees there are no acute findings. [CF]  2326 I viewed and interpreted the patient's head, maxillofacial, and cervical spine CT scans.  There is no sign of acute intracranial bleeding and no obvious fracture or dislocation of the bony processes.  The radiologist also did not find any acute traumatic injuries other than some soft tissue swelling around his face/nose. [CF]  Fri Jul 13, 2021  0030 Interpreted lab results, all reassuring including essentially normal CMP, CBC, and high-sensitivity troponin.  Patient has had no chest pain, no indication to repeat troponin. [CF]  0110 Patient's heart rate remains appropriate and below 100.  He is at his baseline mental status.  Repaired facial lacerations as described above.  Wife remains comfortable taking him home and is eager to get both of them him to get some rest.  I gave strict follow-up and return precautions.  They understand and agree with the plan. [CF]  0111 Of note, the wife confirmed that he is up-to-date on his tetanus vaccination as of last year. [CF]    Clinical Course User Index [CF] Hinda Kehr, MD     FINAL CLINICAL IMPRESSION(S) / ED DIAGNOSES   Final diagnoses:  Fall, initial encounter  Facial laceration, initial encounter  Facial contusion, initial encounter  Aphasia as late effect of cerebrovascular accident (CVA)  Frequent  falls     Rx / DC Orders   ED Discharge Orders     None        Note:  This document was prepared using Dragon voice recognition software and may  include unintentional dictation errors.   Hinda Kehr, MD 07/13/21 [phone]

## 2021-07-12 NOTE — ED Triage Notes (Addendum)
Pt presents to ER from home after a mechanical fall while walking in the kitchen.  Pt states he normally used walker to get around, but was not using it when he fell tonight.  Pt has appx 2 inch lac to going vertically down forehead to the bridge of his nose with bleeding controlled at this time.  Pt has pmh of stroke which has left him with some aphasia, and left side weakness, which is his baseline.  Pt does not take blood thinners.  Pt is alert and oriented to baseline, with some hx of dementia.    Ems states pt was in A-fib during transportation, which is new.

## 2021-07-13 MED ORDER — ACETAMINOPHEN 500 MG PO TABS
1000.0000 mg | ORAL_TABLET | Freq: Once | ORAL | Status: AC
  Administered 2021-07-13: 1000 mg via ORAL
  Filled 2021-07-13: qty 2

## 2021-07-13 NOTE — Discharge Instructions (Addendum)
You have been seen in the Emergency Department (ED) today for a fall.  Your work up does not show any injuries that require you to stay in the hospital.  The lacerations on your forehead and nose were repaired with a combination of clip and adhesive (skin glue) and tape.  Please try to keep these as dry as possible although it is okay if they get a little bit wet.  They should follow-up in about a week to 10 days, but you can follow-up with your primary care doctor in that time.  To have them removed if they have not already come off.   Please take over-the-counter ibuprofen and/or Tylenol as needed for your pain (unless you have an allergy or your doctor as told you not to take them), or take any prescribed medication as instructed.  Please follow up with your doctor regarding today's Emergency Department (ED) visit and your recent fall.    Return to the ED if you have any headache, confusion, slurred speech (more than baseline), weakness/numbness of any arm or leg, or any increased pain.

## 2021-07-13 NOTE — ED Notes (Signed)
ED Provider at bedside. 

## 2021-07-17 DIAGNOSIS — J301 Allergic rhinitis due to pollen: Secondary | ICD-10-CM | POA: Diagnosis not present

## 2021-07-17 DIAGNOSIS — J3089 Other allergic rhinitis: Secondary | ICD-10-CM | POA: Diagnosis not present

## 2021-07-25 ENCOUNTER — Ambulatory Visit (INDEPENDENT_AMBULATORY_CARE_PROVIDER_SITE_OTHER): Payer: PPO | Admitting: *Deleted

## 2021-07-25 DIAGNOSIS — I10 Essential (primary) hypertension: Secondary | ICD-10-CM

## 2021-07-25 DIAGNOSIS — I480 Paroxysmal atrial fibrillation: Secondary | ICD-10-CM

## 2021-07-25 DIAGNOSIS — R296 Repeated falls: Secondary | ICD-10-CM

## 2021-07-25 NOTE — Patient Instructions (Signed)
CONGRATULATIONS ON COMPLETING YOUR GOALS.  IT AS BEEN A PLEASURE WORKING WITH AND TALKING TO YOU.  IF  NEEDS [facility] IN THE FUTURE PLEASE DO NOT HESITATE TO CONTACT ME  [phone]  Richardo Popoff RN, MSN RN Care Management Coordinator [facility] [phone] [email or web address]  

## 2021-07-25 NOTE — Chronic Care Management (AMB) (Signed)
  Care Management   Follow Up Note   07/25/2021 Name: Todd Golden. MRN: [id number] DOB: 01-15-1931   Referred by: Crecencio Mc, MD Reason for referral : Case Closure   Successful outreach to patient's wife.  States he is doing well without complaints.  Does report fall at home on 6/23 due to possibly moving too fast.  Fall precautions and prevent reviewed and discussed.  Discussed goals and both agree patient has met goals of the program.  Follow Up Plan: The patient has been provided with contact information for the care management team and has been advised to call with any health related questions or concerns.  Successful outreach to patient.  States she is doing well without complaints.  Discussed goals and both agree patient has met goals of the program.  [name] RN, MSN RN Care Management Coordinator [place] [phone] [email or web address]

## 2021-07-26 DIAGNOSIS — J301 Allergic rhinitis due to pollen: Secondary | ICD-10-CM | POA: Diagnosis not present

## 2021-07-26 DIAGNOSIS — J3089 Other allergic rhinitis: Secondary | ICD-10-CM | POA: Diagnosis not present

## 2021-07-31 DIAGNOSIS — J3089 Other allergic rhinitis: Secondary | ICD-10-CM | POA: Diagnosis not present

## 2021-07-31 DIAGNOSIS — J301 Allergic rhinitis due to pollen: Secondary | ICD-10-CM | POA: Diagnosis not present

## 2021-08-03 DIAGNOSIS — H353211 Exudative age-related macular degeneration, right eye, with active choroidal neovascularization: Secondary | ICD-10-CM | POA: Diagnosis not present

## 2021-08-07 DIAGNOSIS — J301 Allergic rhinitis due to pollen: Secondary | ICD-10-CM | POA: Diagnosis not present

## 2021-08-07 DIAGNOSIS — J3081 Allergic rhinitis due to animal (cat) (dog) hair and dander: Secondary | ICD-10-CM | POA: Diagnosis not present

## 2021-08-07 DIAGNOSIS — J3089 Other allergic rhinitis: Secondary | ICD-10-CM | POA: Diagnosis not present

## 2021-08-14 DIAGNOSIS — J3089 Other allergic rhinitis: Secondary | ICD-10-CM | POA: Diagnosis not present

## 2021-08-14 DIAGNOSIS — J301 Allergic rhinitis due to pollen: Secondary | ICD-10-CM | POA: Diagnosis not present

## 2021-08-20 DIAGNOSIS — I480 Paroxysmal atrial fibrillation: Secondary | ICD-10-CM

## 2021-08-20 DIAGNOSIS — I10 Essential (primary) hypertension: Secondary | ICD-10-CM

## 2021-08-21 DIAGNOSIS — J301 Allergic rhinitis due to pollen: Secondary | ICD-10-CM | POA: Diagnosis not present

## 2021-08-21 DIAGNOSIS — J3089 Other allergic rhinitis: Secondary | ICD-10-CM | POA: Diagnosis not present

## 2021-08-24 ENCOUNTER — Other Ambulatory Visit: Payer: Self-pay | Admitting: Internal Medicine

## 2021-08-28 DIAGNOSIS — J301 Allergic rhinitis due to pollen: Secondary | ICD-10-CM | POA: Diagnosis not present

## 2021-08-28 DIAGNOSIS — J3089 Other allergic rhinitis: Secondary | ICD-10-CM | POA: Diagnosis not present

## 2021-09-04 DIAGNOSIS — J301 Allergic rhinitis due to pollen: Secondary | ICD-10-CM | POA: Diagnosis not present

## 2021-09-04 DIAGNOSIS — J3089 Other allergic rhinitis: Secondary | ICD-10-CM | POA: Diagnosis not present

## 2021-09-10 ENCOUNTER — Other Ambulatory Visit: Payer: Self-pay

## 2021-09-10 MED ORDER — HYDROCHLOROTHIAZIDE 25 MG PO TABS: 25.0000 mg | ORAL_TABLET | Freq: Every day | ORAL | 1 refills | Status: DC

## 2021-09-11 DIAGNOSIS — J3089 Other allergic rhinitis: Secondary | ICD-10-CM | POA: Diagnosis not present

## 2021-09-11 DIAGNOSIS — J301 Allergic rhinitis due to pollen: Secondary | ICD-10-CM | POA: Diagnosis not present

## 2021-09-18 DIAGNOSIS — J301 Allergic rhinitis due to pollen: Secondary | ICD-10-CM | POA: Diagnosis not present

## 2021-09-18 DIAGNOSIS — J3089 Other allergic rhinitis: Secondary | ICD-10-CM | POA: Diagnosis not present

## 2021-09-27 DIAGNOSIS — J301 Allergic rhinitis due to pollen: Secondary | ICD-10-CM | POA: Diagnosis not present

## 2021-09-27 DIAGNOSIS — J3089 Other allergic rhinitis: Secondary | ICD-10-CM | POA: Diagnosis not present

## 2021-10-02 DIAGNOSIS — J301 Allergic rhinitis due to pollen: Secondary | ICD-10-CM | POA: Diagnosis not present

## 2021-10-02 DIAGNOSIS — J3089 Other allergic rhinitis: Secondary | ICD-10-CM | POA: Diagnosis not present

## 2021-10-09 DIAGNOSIS — J3089 Other allergic rhinitis: Secondary | ICD-10-CM | POA: Diagnosis not present

## 2021-10-09 DIAGNOSIS — J301 Allergic rhinitis due to pollen: Secondary | ICD-10-CM | POA: Diagnosis not present

## 2021-10-16 DIAGNOSIS — J3089 Other allergic rhinitis: Secondary | ICD-10-CM | POA: Diagnosis not present

## 2021-10-16 DIAGNOSIS — J301 Allergic rhinitis due to pollen: Secondary | ICD-10-CM | POA: Diagnosis not present

## 2021-10-19 ENCOUNTER — Other Ambulatory Visit: Payer: Self-pay | Admitting: Cardiovascular Disease

## 2021-10-19 NOTE — Telephone Encounter (Signed)
Hi Todd Golden,  This is a patient of Dr. Fletcher Anon. Should I defer this medication request to his pcp? It seems like she has been providing him with this medication for sometime. I just wanted to make sure. Thank you so much.

## 2021-10-22 ENCOUNTER — Other Ambulatory Visit: Payer: Self-pay | Admitting: Internal Medicine

## 2021-10-23 DIAGNOSIS — J3089 Other allergic rhinitis: Secondary | ICD-10-CM | POA: Diagnosis not present

## 2021-10-23 DIAGNOSIS — J301 Allergic rhinitis due to pollen: Secondary | ICD-10-CM | POA: Diagnosis not present

## 2021-10-26 DIAGNOSIS — H353211 Exudative age-related macular degeneration, right eye, with active choroidal neovascularization: Secondary | ICD-10-CM | POA: Diagnosis not present

## 2021-10-30 DIAGNOSIS — J301 Allergic rhinitis due to pollen: Secondary | ICD-10-CM | POA: Diagnosis not present

## 2021-10-30 DIAGNOSIS — J3089 Other allergic rhinitis: Secondary | ICD-10-CM | POA: Diagnosis not present

## 2021-10-30 DIAGNOSIS — J3081 Allergic rhinitis due to animal (cat) (dog) hair and dander: Secondary | ICD-10-CM | POA: Diagnosis not present

## 2021-11-02 ENCOUNTER — Encounter: Payer: Self-pay | Admitting: Internal Medicine

## 2021-11-02 ENCOUNTER — Ambulatory Visit (INDEPENDENT_AMBULATORY_CARE_PROVIDER_SITE_OTHER): Payer: PPO | Admitting: Internal Medicine

## 2021-11-02 VITALS — BP 110/72 | HR 84 | Temp 97.5°F | Ht 71.0 in | Wt 187.8 lb

## 2021-11-02 DIAGNOSIS — R29898 Other symptoms and signs involving the musculoskeletal system: Secondary | ICD-10-CM

## 2021-11-02 DIAGNOSIS — E785 Hyperlipidemia, unspecified: Secondary | ICD-10-CM

## 2021-11-02 DIAGNOSIS — Z0000 Encounter for general adult medical examination without abnormal findings: Secondary | ICD-10-CM

## 2021-11-02 DIAGNOSIS — N135 Crossing vessel and stricture of ureter without hydronephrosis: Secondary | ICD-10-CM

## 2021-11-02 DIAGNOSIS — D6869 Other thrombophilia: Secondary | ICD-10-CM

## 2021-11-02 DIAGNOSIS — I480 Paroxysmal atrial fibrillation: Secondary | ICD-10-CM | POA: Diagnosis not present

## 2021-11-02 DIAGNOSIS — G319 Degenerative disease of nervous system, unspecified: Secondary | ICD-10-CM | POA: Diagnosis not present

## 2021-11-02 DIAGNOSIS — R296 Repeated falls: Secondary | ICD-10-CM

## 2021-11-02 DIAGNOSIS — I10 Essential (primary) hypertension: Secondary | ICD-10-CM | POA: Diagnosis not present

## 2021-11-02 DIAGNOSIS — Z125 Encounter for screening for malignant neoplasm of prostate: Secondary | ICD-10-CM

## 2021-11-02 DIAGNOSIS — Z23 Encounter for immunization: Secondary | ICD-10-CM

## 2021-11-02 DIAGNOSIS — R7303 Prediabetes: Secondary | ICD-10-CM | POA: Diagnosis not present

## 2021-11-02 LAB — PSA, MEDICARE: PSA: 3.32 ng/ml (ref 0.10–4.00)

## 2021-11-02 LAB — CBC WITH DIFFERENTIAL/PLATELET
Basophils Absolute: 0 10*3/uL (ref 0.0–0.1)
Basophils Relative: 0.5 % (ref 0.0–3.0)
Eosinophils Absolute: 0.3 10*3/uL (ref 0.0–0.7)
Eosinophils Relative: 4.2 % (ref 0.0–5.0)
HCT: 42.3 % (ref 39.0–52.0)
Hemoglobin: 14 g/dL (ref 13.0–17.0)
Lymphocytes Relative: 25.3 % (ref 12.0–46.0)
Lymphs Abs: 1.6 10*3/uL (ref 0.7–4.0)
MCHC: 33.1 g/dL (ref 30.0–36.0)
MCV: 94.1 fl (ref 78.0–100.0)
Monocytes Absolute: 0.9 10*3/uL (ref 0.1–1.0)
Monocytes Relative: 13.6 % — ABNORMAL HIGH (ref 3.0–12.0)
Neutro Abs: 3.6 10*3/uL (ref 1.4–7.7)
Neutrophils Relative %: 56.4 % (ref 43.0–77.0)
Platelets: 232 10*3/uL (ref 150.0–400.0)
RBC: 4.49 Mil/uL (ref 4.22–5.81)
RDW: 13.9 % (ref 11.5–15.5)
WBC: 6.4 10*3/uL (ref 4.0–10.5)

## 2021-11-02 LAB — LIPID PANEL
Cholesterol: 149 mg/dL (ref 0–200)
HDL: 53.9 mg/dL (ref >39.00)
LDL Cholesterol: 73 mg/dL (ref 0–99)
NonHDL: 95.28
Total CHOL/HDL Ratio: 3
Triglycerides: 111 mg/dL (ref 0.0–149.0)
VLDL: 22.2 mg/dL (ref 0.0–40.0)

## 2021-11-02 LAB — COMPREHENSIVE METABOLIC PANEL
ALT: 21 U/L (ref 0–53)
AST: 23 U/L (ref 0–37)
Albumin: 4.3 g/dL (ref 3.5–5.2)
Alkaline Phosphatase: 57 U/L (ref 39–117)
BUN: 23 mg/dL (ref 6–23)
CO2: 31 mEq/L (ref 19–32)
Calcium: 9.5 mg/dL (ref 8.4–10.5)
Chloride: 97 mEq/L (ref 96–112)
Creatinine, Ser: 1.21 mg/dL (ref 0.40–1.50)
GFR: 53.15 mL/min — ABNORMAL LOW (ref >60.00)
Glucose, Bld: 102 mg/dL — ABNORMAL HIGH (ref 70–99)
Potassium: 4.2 mEq/L (ref 3.5–5.1)
Sodium: 138 mEq/L (ref 135–145)
Total Bilirubin: 0.9 mg/dL (ref 0.2–1.2)
Total Protein: 7.3 g/dL (ref 6.0–8.3)

## 2021-11-02 LAB — HEMOGLOBIN A1C: Hgb A1c MFr Bld: 6.2 % (ref 4.6–6.5)

## 2021-11-02 LAB — TSH: TSH: 1.54 u[IU]/mL (ref 0.35–5.50)

## 2021-11-02 MED ORDER — RSVPREF3 VAC RECOMB ADJUVANTED 120 MCG/0.5ML IM SUSR: 0.5000 mL | Freq: Once | INTRAMUSCULAR | 0 refills | Status: AC

## 2021-11-02 MED ORDER — SERTRALINE HCL 50 MG PO TABS: 50.0000 mg | ORAL_TABLET | Freq: Every day | ORAL | 1 refills | Status: DC

## 2021-11-02 NOTE — Progress Notes (Unsigned)
Patient ID: Todd Golden., male    DOB: [birth year]  Age: 88 y.o. MRN: [id number]  The patient is here for annual preventive examination and management of other chronic and acute problems.   The risk factors are reflected in the social history.  The roster of all physicians providing medical care to patient - is listed in the Snapshot section of the chart.  Activities of daily living:  The patient is 100% independent in all ADLs: dressing, toileting, feeding as well as independent mobility  Home safety : The patient has smoke detectors in the home. They wear seatbelts.  There are no firearms at home. There is no violence in the home.   There is no risks for hepatitis, STDs or HIV. There is no   history of blood transfusion. They have no travel history to infectious disease endemic areas of the world.  The patient has seen their dentist in the last six month. They have seen their eye doctor in the last year. They admit to slight hearing difficulty with regard to whispered voices and some television programs.  They have deferred audiologic testing in the last year.  They do not  have excessive sun exposure. Discussed the need for sun protection: hats, long sleeves and use of sunscreen if there is significant sun exposure.   Diet: the importance of a healthy diet is discussed. They do have a healthy diet.  The benefits of regular aerobic exercise were discussed. She walks 4 times per week ,  20 minutes.   Depression screen: there are no signs or vegative symptoms of depression- irritability, change in appetite, anhedonia, sadness/tearfullness.  Cognitive assessment: the patient manages all their financial and personal affairs and is actively engaged. They could relate day,date,year and events; recalled 2/3 objects at 3 minutes; performed clock-face test normally.  The following portions of the patient's history were reviewed and updated as appropriate: allergies, current medications, past  family history, past medical history,  past surgical history, past social history  and problem list.  Visual acuity was not assessed per patient preference since she has regular follow up with her ophthalmologist. Hearing and body mass index were assessed and reviewed.   During the course of the visit the patient was educated and counseled about appropriate screening and preventive services including : fall prevention , diabetes screening, nutrition counseling, colorectal cancer screening, and recommended immunizations.    CC: The primary encounter diagnosis was Essential hypertension, benign. Diagnoses of Acquired thrombophilia (Todd Golden), Hyperlipidemia LDL goal <100, Prediabetes, Screening for prostate cancer, and Need for immunization against influenza were also pertinent to this visit.  1) Post CVA symptoms of expressive aphasia  and ataxia  reviewed  ER visit in June for facial lacerations following a fall at home.    3) frequent falls (13 this year) most occurring at night trying to get out of bed . Neighbor is  physical therapost and has loaned him a handrail   2) Chronic atrial fib:  y  4 ) trouble swallowing occurred 2 times per month  liquid ends up coming out of his nose. Marland Kitchen  No choking.    History Todd Golden has a past medical history of Allergy, Arthritis, Asthma, Cataract, Chronic constipation, Colon polyps, Cough, GERD (gastroesophageal reflux disease), Glaucoma, HOH (hard of hearing), Hyperlipidemia, Hypertension, Macular degeneration, Nephrolithiasis, PAF (paroxysmal atrial fibrillation) (Todd Golden), Stroke (Todd Golden), and Ureteral stricture.   He has a past surgical history that includes Joint replacement (Left, 2014); kidney stent (Right, 1995); and Cataract  extraction w/PHACO (Left, 12/20/2014).   His family history includes Arthritis in his mother; Cancer (age of onset: 32) in his mother; Stroke in his father.He reports that he has never smoked. He has never been exposed to tobacco smoke. He  has never used smokeless tobacco. He reports current alcohol use. He reports that he does not use drugs.  Outpatient Medications Prior to Visit  Medication Sig Dispense Refill   amLODipine (NORVASC) 5 MG tablet TAKE 1 TABLET(5 MG) BY MOUTH DAILY 90 tablet 1   aspirin EC 81 MG tablet Take 81 mg by mouth daily. Swallow whole.     citalopram (CELEXA) 10 MG tablet Take 1 tablet (10 mg total) by mouth daily. 90 tablet 1   donepezil (ARICEPT) 10 MG tablet Take 10 mg by mouth daily.     doxazosin (CARDURA) 4 MG tablet Take 1 tablet (4 mg total) by mouth daily. 90 tablet 0   EPINEPHrine 0.3 mg/0.3 mL IJ SOAJ injection Inject 0.3 mg into the muscle as needed for anaphylaxis.     hydrochlorothiazide (HYDRODIURIL) 25 MG tablet Take 1 tablet (25 mg total) by mouth daily. 90 tablet 1   latanoprost (XALATAN) 0.005 % ophthalmic solution Place 1 drop into both eyes at bedtime.     Multiple Vitamins-Minerals (PRESERVISION AREDS 2 PO) Take by mouth daily.     omeprazole (PRILOSEC) 40 MG capsule TAKE 1 CAPSULE(40 MG) BY MOUTH DAILY 90 capsule 3   pindolol (VISKEN) 10 MG tablet TAKE 1 TABLET(10 MG) BY MOUTH DAILY 90 tablet 0   simvastatin (ZOCOR) 20 MG tablet Take 1 tablet (20 mg total) by mouth daily. 90 tablet 3   No facility-administered medications prior to visit.    Review of Systems  Objective:  BP 110/72 (BP Location: Left Arm, Patient Position: Sitting, Cuff Size: Large)   Pulse 84   Temp (!) 97.5 F (36.4 C) (Oral)   Ht '5\' 11"'$  (1.803 m)   Wt 187 lb 12.8 oz (85.2 kg)   SpO2 98%   BMI 26.19 kg/m   Physical Exam  Physical Exam   Assessment & Plan:   Problem List Items Addressed This Visit     Essential hypertension, benign - Primary   Hyperlipidemia LDL goal <100   Prediabetes   Acquired thrombophilia (Todd Golden)   Other Visit Diagnoses     Screening for prostate cancer       Need for immunization against influenza       Relevant Orders   Flu Vaccine QUAD High Dose(Fluad) (Completed)        I am having Todd Golden. maintain his latanoprost, Multiple Vitamins-Minerals (PRESERVISION AREDS 2 PO), EPINEPHrine, doxazosin, aspirin EC, donepezil, omeprazole, simvastatin, citalopram, pindolol, hydrochlorothiazide, and amLODipine.  No orders of the defined types were placed in this encounter.   There are no discontinued medications.  Follow-up: No follow-ups on file.   Crecencio Mc, MD

## 2021-11-02 NOTE — Patient Instructions (Addendum)
I recommend that Todd Golden try the Nutrafol vitamin for women 's hair loss issues. It can be purchased on their website   DME orders for urinal and bedrail given to you today  Change in therapy from celexa to zoloft (sertraline) to help mood and energy  The dose of sertraline can be increased after a few weeks from 50 mg daily to 100 mg daily  I would like you to return in about 6 weeks   I do recommend the RSV vaccine for you, .  It is now available at your pharmacy  and will protect you against the Respiratory Syncytial Virus   Regards,   Deborra Medina, MD

## 2021-11-04 NOTE — Assessment & Plan Note (Signed)
Secondary to atrial fib, previously mitiigated with Eliquis for embolic stroke risk mitigation; however,  Eliquis has been stopped due to frequent falls

## 2021-11-04 NOTE — Assessment & Plan Note (Signed)
Secondary to  Embolic CVA and cerebrovascular disease,  With mild dementia based on reports by his wife of forgetfulness at home.

## 2021-11-04 NOTE — Assessment & Plan Note (Signed)
Rate controlled with  Pindolol, not anticoagulated given recurrent falls

## 2021-11-04 NOTE — Assessment & Plan Note (Addendum)
He was referred for another opinion in July 2022 and saw Nickolas Madrid.  Lifestyle modifications were recommended to reduce nocturia.  Marland Kitchen

## 2021-11-04 NOTE — Assessment & Plan Note (Signed)
His most recent fall occurred while getting out of bed .  Hand rails for bed and a urinal have been ordered

## 2021-11-04 NOTE — Assessment & Plan Note (Signed)

## 2021-11-06 DIAGNOSIS — J3081 Allergic rhinitis due to animal (cat) (dog) hair and dander: Secondary | ICD-10-CM | POA: Diagnosis not present

## 2021-11-06 DIAGNOSIS — J3089 Other allergic rhinitis: Secondary | ICD-10-CM | POA: Diagnosis not present

## 2021-11-06 DIAGNOSIS — J301 Allergic rhinitis due to pollen: Secondary | ICD-10-CM | POA: Diagnosis not present

## 2021-11-06 DIAGNOSIS — R296 Repeated falls: Secondary | ICD-10-CM | POA: Diagnosis not present

## 2021-11-13 DIAGNOSIS — J301 Allergic rhinitis due to pollen: Secondary | ICD-10-CM | POA: Diagnosis not present

## 2021-11-13 DIAGNOSIS — J3089 Other allergic rhinitis: Secondary | ICD-10-CM | POA: Diagnosis not present

## 2021-11-20 DIAGNOSIS — J3089 Other allergic rhinitis: Secondary | ICD-10-CM | POA: Diagnosis not present

## 2021-11-20 DIAGNOSIS — J301 Allergic rhinitis due to pollen: Secondary | ICD-10-CM | POA: Diagnosis not present

## 2021-11-30 ENCOUNTER — Other Ambulatory Visit: Payer: Self-pay | Admitting: Internal Medicine

## 2021-12-04 DIAGNOSIS — J3089 Other allergic rhinitis: Secondary | ICD-10-CM | POA: Diagnosis not present

## 2021-12-04 DIAGNOSIS — J3081 Allergic rhinitis due to animal (cat) (dog) hair and dander: Secondary | ICD-10-CM | POA: Diagnosis not present

## 2021-12-04 DIAGNOSIS — J301 Allergic rhinitis due to pollen: Secondary | ICD-10-CM | POA: Diagnosis not present

## 2021-12-17 ENCOUNTER — Encounter: Payer: Self-pay | Admitting: Internal Medicine

## 2021-12-17 ENCOUNTER — Ambulatory Visit (INDEPENDENT_AMBULATORY_CARE_PROVIDER_SITE_OTHER): Payer: PPO | Admitting: Internal Medicine

## 2021-12-17 ENCOUNTER — Ambulatory Visit (INDEPENDENT_AMBULATORY_CARE_PROVIDER_SITE_OTHER): Payer: PPO

## 2021-12-17 VITALS — BP 120/76 | HR 81 | Temp 97.9°F | Ht 71.0 in | Wt 188.0 lb

## 2021-12-17 DIAGNOSIS — R1310 Dysphagia, unspecified: Secondary | ICD-10-CM

## 2021-12-17 DIAGNOSIS — M2012 Hallux valgus (acquired), left foot: Secondary | ICD-10-CM | POA: Diagnosis not present

## 2021-12-17 DIAGNOSIS — R296 Repeated falls: Secondary | ICD-10-CM

## 2021-12-17 DIAGNOSIS — R2689 Other abnormalities of gait and mobility: Secondary | ICD-10-CM

## 2021-12-17 DIAGNOSIS — Z66 Do not resuscitate: Secondary | ICD-10-CM

## 2021-12-17 DIAGNOSIS — M2062 Acquired deformities of toe(s), unspecified, left foot: Secondary | ICD-10-CM | POA: Diagnosis not present

## 2021-12-17 DIAGNOSIS — I10 Essential (primary) hypertension: Secondary | ICD-10-CM | POA: Diagnosis not present

## 2021-12-17 DIAGNOSIS — R351 Nocturia: Secondary | ICD-10-CM | POA: Diagnosis not present

## 2021-12-17 DIAGNOSIS — M7732 Calcaneal spur, left foot: Secondary | ICD-10-CM | POA: Diagnosis not present

## 2021-12-17 DIAGNOSIS — N401 Enlarged prostate with lower urinary tract symptoms: Secondary | ICD-10-CM

## 2021-12-17 NOTE — Patient Instructions (Signed)
Home health physical therapy referral made  Referral for speech /swallow evaluation  Referral to podiatry to evaluate nonsurgical management of acquired toe deformity

## 2021-12-17 NOTE — Progress Notes (Unsigned)
Subjective:  Patient ID: Todd Glaze., male    DOB: 12/15/1930  Age: 88 y.o. MRN: [id number]  CC: There were no encounter diagnoses.   HPI Todd Glaze. presents for  Chief Complaint  Patient presents with   Follow-up    6 week follow up     Seen on Oct 3 for AWV with multiple chronic issues  worsening  Depression/anxiety:  aggravated by aphasia. .  Antidepressant changed to sertraline.  Sleeping better, less agitated.   Frequent falls .  2 more falls.  Occurred during the day while bending over . Loses center of gravity . Home  PT requested   Swallow dysfunction ,  aspiration and liquid/foot comes out his nose.    Coughs .  Started a few years after the CVA in 2015.  Formal swallow   Left foot problem.  Acquired deformity of big toe, retracted ,  told he "lost a tendon" years ago (> 10 yrs ago )    Outpatient Medications Prior to Visit  Medication Sig Dispense Refill   amLODipine (NORVASC) 5 MG tablet TAKE 1 TABLET(5 MG) BY MOUTH DAILY 90 tablet 1   aspirin EC 81 MG tablet Take 81 mg by mouth daily. Swallow whole.     donepezil (ARICEPT) 10 MG tablet Take 10 mg by mouth daily.     doxazosin (CARDURA) 4 MG tablet Take 1 tablet (4 mg total) by mouth daily. 90 tablet 0   EPINEPHrine 0.3 mg/0.3 mL IJ SOAJ injection Inject 0.3 mg into the muscle as needed for anaphylaxis.     hydrochlorothiazide (HYDRODIURIL) 25 MG tablet Take 1 tablet (25 mg total) by mouth daily. 90 tablet 1   latanoprost (XALATAN) 0.005 % ophthalmic solution Place 1 drop into both eyes at bedtime.     Multiple Vitamins-Minerals (PRESERVISION AREDS 2 PO) Take by mouth daily.     omeprazole (PRILOSEC) 40 MG capsule TAKE 1 CAPSULE(40 MG) BY MOUTH DAILY 90 capsule 3   pindolol (VISKEN) 10 MG tablet TAKE 1 TABLET(10 MG) BY MOUTH DAILY 90 tablet 1   sertraline (ZOLOFT) 50 MG tablet Take 1 tablet (50 mg total) by mouth daily. 90 tablet 1   simvastatin (ZOCOR) 20 MG tablet Take 1 tablet (20 mg total) by  mouth daily. 90 tablet 3   No facility-administered medications prior to visit.    Review of Systems;  Patient denies headache, fevers, malaise, unintentional weight loss, skin rash, eye pain, sinus congestion and sinus pain, sore throat, dysphagia,  hemoptysis , cough, dyspnea, wheezing, chest pain, palpitations, orthopnea, edema, abdominal pain, nausea, melena, diarrhea, constipation, flank pain, dysuria, hematuria, urinary  Frequency, nocturia, numbness, tingling, seizures,  Focal weakness, Loss of consciousness,  Tremor, insomnia, depression, anxiety, and suicidal ideation.      Objective:  BP 120/76 (BP Location: Left Arm, Patient Position: Sitting, Cuff Size: Normal)   Pulse 81   Temp 97.9 F (36.6 C) (Oral)   Ht '5\' 11"'$  (1.803 m)   Wt 188 lb (85.3 kg)   SpO2 96%   BMI 26.22 kg/m   BP Readings from Last 3 Encounters:  12/17/21 120/76  11/02/21 110/72  07/13/21 (!) 143/86    Wt Readings from Last 3 Encounters:  12/17/21 188 lb (85.3 kg)  11/02/21 187 lb 12.8 oz (85.2 kg)  07/12/21 194 lb 10.7 oz (88.3 kg)    General appearance: alert, cooperative and appears stated age Ears: normal TM's and external ear canals both ears Throat: lips, mucosa,  and tongue normal; teeth and gums normal Neck: no adenopathy, no carotid bruit, supple, symmetrical, trachea midline and thyroid not enlarged, symmetric, no tenderness/mass/nodules Back: symmetric, no curvature. ROM normal. No CVA tenderness. Lungs: clear to auscultation bilaterally Heart: regular rate and rhythm, S1, S2 normal, no murmur, click, rub or gallop Abdomen: soft, non-tender; bowel sounds normal; no masses,  no organomegaly Pulses: 2+ and symmetric Skin: Skin color, texture, turgor normal. No rashes or lesions Lymph nodes: Cervical, supraclavicular, and axillary nodes normal. Neuro:  awake and interactive with normal mood and affect. Higher cortical functions are normal. Speech is clear without word-finding difficulty  or dysarthria. Extraocular movements are intact. Visual fields of both eyes are grossly intact. Sensation to light touch is grossly intact bilaterally of upper and lower extremities. Motor examination shows 4+/5 symmetric hand grip and upper extremity and 5/5 lower extremity strength. There is no pronation or drift. Gait is non-ataxic   Lab Results  Component Value Date   HGBA1C 6.2 11/02/2021   HGBA1C 6.3 08/02/2020   HGBA1C 6.3 12/01/2019    Lab Results  Component Value Date   CREATININE 1.21 11/02/2021   CREATININE 1.18 07/12/2021   CREATININE 1.10 08/02/2020    Lab Results  Component Value Date   WBC 6.4 11/02/2021   HGB 14.0 11/02/2021   HCT 42.3 11/02/2021   PLT 232.0 11/02/2021   GLUCOSE 102 (H) 11/02/2021   CHOL 149 11/02/2021   TRIG 111.0 11/02/2021   HDL 53.90 11/02/2021   LDLCALC 73 11/02/2021   ALT 21 11/02/2021   AST 23 11/02/2021   NA 138 11/02/2021   K 4.2 11/02/2021   CL 97 11/02/2021   CREATININE 1.21 11/02/2021   BUN 23 11/02/2021   CO2 31 11/02/2021   TSH 1.54 11/02/2021   PSA 3.32 11/02/2021   HGBA1C 6.2 11/02/2021    DG Chest Portable 1 View  Result Date: 07/12/2021 CLINICAL DATA:  Fall EXAM: PORTABLE CHEST 1 VIEW COMPARISON:  None Available. FINDINGS: Mild cardiomegaly. No focal opacity, pleural effusion or pneumothorax. Negative for pulmonary edema. Severe degenerative changes of the bilateral shoulders. IMPRESSION: No active disease.  Mild cardiomegaly Electronically Signed   By: Donavan Foil M.D.   On: 07/12/2021 23:21   CT HEAD WO CONTRAST (5MM)  Result Date: 07/12/2021 CLINICAL DATA:  Recent fall with headaches and neck pain, initial encounter EXAM: CT HEAD WITHOUT CONTRAST CT MAXILLOFACIAL WITHOUT CONTRAST CT CERVICAL SPINE WITHOUT CONTRAST TECHNIQUE: Multidetector CT imaging of the head, cervical spine, and maxillofacial structures were performed using the standard protocol without intravenous contrast. Multiplanar CT image reconstructions  of the cervical spine and maxillofacial structures were also generated. RADIATION DOSE REDUCTION: This exam was performed according to the departmental dose-optimization program which includes automated exposure control, adjustment of the mA and/or kV according to patient size and/or use of iterative reconstruction technique. COMPARISON:  12/09/2019 FINDINGS: CT HEAD FINDINGS Brain: No evidence of acute infarction, hemorrhage, hydrocephalus, extra-axial collection or mass lesion/mass effect. Chronic atrophic and ischemic changes are again identified. Changes of prior left MCA infarct are again noted and stable. Vascular: No hyperdense vessel or unexpected calcification. Skull: Normal. Negative for fracture or focal lesion. Other: None. CT MAXILLOFACIAL FINDINGS Osseous: No acute fracture is identified. Orbits: Orbits and their contents are within normal limits. Sinuses: Mucosal changes are noted in the left frontal sinus as well as within the right maxillary antrum. Soft tissues: Surrounding soft tissue structures are within normal limits. CT CERVICAL SPINE FINDINGS Alignment: Within normal limits.  Skull base and vertebrae: Cervical segments are well visualized. Vertebral body height is well maintained. Multilevel osteophytic changes and facet hypertrophic changes are seen. No acute fracture or acute facet abnormality is noted. Soft tissues and spinal canal: Surrounding soft tissue structures are within normal limits. Upper chest: Visualized lung apices are unremarkable. Other: None IMPRESSION: CT of the head: Chronic atrophic and ischemic changes without acute abnormality. CT of the maxillofacial bones: No acute bony abnormality is seen. Mucosal changes are noted in the left frontal and right maxillary sinuses. CT of the cervical spine: Multilevel degenerative change without acute abnormality. Electronically Signed   By: Inez Catalina M.D.   On: 07/12/2021 23:00   CT Maxillofacial Wo Contrast  Result Date:  07/12/2021 CLINICAL DATA:  Recent fall with headaches and neck pain, initial encounter EXAM: CT HEAD WITHOUT CONTRAST CT MAXILLOFACIAL WITHOUT CONTRAST CT CERVICAL SPINE WITHOUT CONTRAST TECHNIQUE: Multidetector CT imaging of the head, cervical spine, and maxillofacial structures were performed using the standard protocol without intravenous contrast. Multiplanar CT image reconstructions of the cervical spine and maxillofacial structures were also generated. RADIATION DOSE REDUCTION: This exam was performed according to the departmental dose-optimization program which includes automated exposure control, adjustment of the mA and/or kV according to patient size and/or use of iterative reconstruction technique. COMPARISON:  12/09/2019 FINDINGS: CT HEAD FINDINGS Brain: No evidence of acute infarction, hemorrhage, hydrocephalus, extra-axial collection or mass lesion/mass effect. Chronic atrophic and ischemic changes are again identified. Changes of prior left MCA infarct are again noted and stable. Vascular: No hyperdense vessel or unexpected calcification. Skull: Normal. Negative for fracture or focal lesion. Other: None. CT MAXILLOFACIAL FINDINGS Osseous: No acute fracture is identified. Orbits: Orbits and their contents are within normal limits. Sinuses: Mucosal changes are noted in the left frontal sinus as well as within the right maxillary antrum. Soft tissues: Surrounding soft tissue structures are within normal limits. CT CERVICAL SPINE FINDINGS Alignment: Within normal limits. Skull base and vertebrae: Cervical segments are well visualized. Vertebral body height is well maintained. Multilevel osteophytic changes and facet hypertrophic changes are seen. No acute fracture or acute facet abnormality is noted. Soft tissues and spinal canal: Surrounding soft tissue structures are within normal limits. Upper chest: Visualized lung apices are unremarkable. Other: None IMPRESSION: CT of the head: Chronic atrophic and  ischemic changes without acute abnormality. CT of the maxillofacial bones: No acute bony abnormality is seen. Mucosal changes are noted in the left frontal and right maxillary sinuses. CT of the cervical spine: Multilevel degenerative change without acute abnormality. Electronically Signed   By: Inez Catalina M.D.   On: 07/12/2021 23:00   CT Cervical Spine Wo Contrast  Result Date: 07/12/2021 CLINICAL DATA:  Recent fall with headaches and neck pain, initial encounter EXAM: CT HEAD WITHOUT CONTRAST CT MAXILLOFACIAL WITHOUT CONTRAST CT CERVICAL SPINE WITHOUT CONTRAST TECHNIQUE: Multidetector CT imaging of the head, cervical spine, and maxillofacial structures were performed using the standard protocol without intravenous contrast. Multiplanar CT image reconstructions of the cervical spine and maxillofacial structures were also generated. RADIATION DOSE REDUCTION: This exam was performed according to the departmental dose-optimization program which includes automated exposure control, adjustment of the mA and/or kV according to patient size and/or use of iterative reconstruction technique. COMPARISON:  12/09/2019 FINDINGS: CT HEAD FINDINGS Brain: No evidence of acute infarction, hemorrhage, hydrocephalus, extra-axial collection or mass lesion/mass effect. Chronic atrophic and ischemic changes are again identified. Changes of prior left MCA infarct are again noted and stable. Vascular: No hyperdense vessel  or unexpected calcification. Skull: Normal. Negative for fracture or focal lesion. Other: None. CT MAXILLOFACIAL FINDINGS Osseous: No acute fracture is identified. Orbits: Orbits and their contents are within normal limits. Sinuses: Mucosal changes are noted in the left frontal sinus as well as within the right maxillary antrum. Soft tissues: Surrounding soft tissue structures are within normal limits. CT CERVICAL SPINE FINDINGS Alignment: Within normal limits. Skull base and vertebrae: Cervical segments are well  visualized. Vertebral body height is well maintained. Multilevel osteophytic changes and facet hypertrophic changes are seen. No acute fracture or acute facet abnormality is noted. Soft tissues and spinal canal: Surrounding soft tissue structures are within normal limits. Upper chest: Visualized lung apices are unremarkable. Other: None IMPRESSION: CT of the head: Chronic atrophic and ischemic changes without acute abnormality. CT of the maxillofacial bones: No acute bony abnormality is seen. Mucosal changes are noted in the left frontal and right maxillary sinuses. CT of the cervical spine: Multilevel degenerative change without acute abnormality. Electronically Signed   By: Inez Catalina M.D.   On: 07/12/2021 23:00    Assessment & Plan:   Problem List Items Addressed This Visit   None   I spent a total of   minutes with this patient in a face to face visit on the date of this encounter reviewing the last office visit with me in       ,  most recent visit with cardiology ,    ,  patient's diet and exercise habits, home blood pressure /blod sugar readings, recent ER visit including labs and imaging studies ,   and post visit ordering of testing and therapeutics.    Follow-up: No follow-ups on file.   Crecencio Mc, MD

## 2021-12-18 DIAGNOSIS — R1310 Dysphagia, unspecified: Secondary | ICD-10-CM | POA: Insufficient documentation

## 2021-12-18 DIAGNOSIS — R296 Repeated falls: Secondary | ICD-10-CM | POA: Insufficient documentation

## 2021-12-18 DIAGNOSIS — J3081 Allergic rhinitis due to animal (cat) (dog) hair and dander: Secondary | ICD-10-CM | POA: Diagnosis not present

## 2021-12-18 DIAGNOSIS — M2062 Acquired deformities of toe(s), unspecified, left foot: Secondary | ICD-10-CM | POA: Insufficient documentation

## 2021-12-18 DIAGNOSIS — J301 Allergic rhinitis due to pollen: Secondary | ICD-10-CM | POA: Diagnosis not present

## 2021-12-18 DIAGNOSIS — J3089 Other allergic rhinitis: Secondary | ICD-10-CM | POA: Diagnosis not present

## 2021-12-18 NOTE — Assessment & Plan Note (Signed)
Secondary to decreased balance and cerebrovascular disease.  No longer anticoagulated given risk of SAH/SDH.  Home PT ordered

## 2021-12-18 NOTE — Assessment & Plan Note (Signed)
Recommending podiatry referral for non surgical management to prevent pressure ulcers

## 2021-12-18 NOTE — Assessment & Plan Note (Signed)
Resulting in frequent falls.  referral for home PT made

## 2021-12-18 NOTE — Assessment & Plan Note (Signed)
Well controlled on current regimen of amlodipine, pindolol, and hctz.  Using cardura as well,  for concurrent BPH.  Denies orthostasis . Renal function stable, no changes today.

## 2021-12-18 NOTE — Assessment & Plan Note (Signed)
He has encouaraged to use a bedside commode or urinary collection device to prevent nocturnal falls

## 2021-12-18 NOTE — Assessment & Plan Note (Signed)
Likely secondary to CVA (remote),  but I hjave referred for a formal swallow evaluation to rule out structural anomalies

## 2021-12-19 ENCOUNTER — Telehealth: Payer: Self-pay | Admitting: Internal Medicine

## 2021-12-19 DIAGNOSIS — F32A Depression, unspecified: Secondary | ICD-10-CM | POA: Diagnosis not present

## 2021-12-19 DIAGNOSIS — F419 Anxiety disorder, unspecified: Secondary | ICD-10-CM | POA: Diagnosis not present

## 2021-12-19 DIAGNOSIS — M205X2 Other deformities of toe(s) (acquired), left foot: Secondary | ICD-10-CM | POA: Diagnosis not present

## 2021-12-19 DIAGNOSIS — I69991 Dysphagia following unspecified cerebrovascular disease: Secondary | ICD-10-CM | POA: Diagnosis not present

## 2021-12-19 DIAGNOSIS — R2689 Other abnormalities of gait and mobility: Secondary | ICD-10-CM | POA: Diagnosis not present

## 2021-12-19 DIAGNOSIS — Z7982 Long term (current) use of aspirin: Secondary | ICD-10-CM | POA: Diagnosis not present

## 2021-12-19 DIAGNOSIS — R296 Repeated falls: Secondary | ICD-10-CM | POA: Diagnosis not present

## 2021-12-19 NOTE — Telephone Encounter (Signed)
Erline Levine from [facility] want the provider to call her about ordering speech therapy service for the pt. She need the ok to order it because pt speech is not good

## 2021-12-19 NOTE — Telephone Encounter (Signed)
Is okay to give verbal order for speech therapy?

## 2021-12-24 NOTE — Telephone Encounter (Signed)
Verbal order given  

## 2021-12-25 ENCOUNTER — Ambulatory Visit: Payer: PPO

## 2021-12-25 ENCOUNTER — Ambulatory Visit: Payer: PPO | Admitting: Podiatry

## 2021-12-25 ENCOUNTER — Encounter: Payer: Self-pay | Admitting: Podiatry

## 2021-12-25 DIAGNOSIS — M7742 Metatarsalgia, left foot: Secondary | ICD-10-CM | POA: Diagnosis not present

## 2021-12-25 DIAGNOSIS — M79672 Pain in left foot: Secondary | ICD-10-CM | POA: Diagnosis not present

## 2021-12-25 DIAGNOSIS — L84 Corns and callosities: Secondary | ICD-10-CM

## 2021-12-25 DIAGNOSIS — M205X2 Other deformities of toe(s) (acquired), left foot: Secondary | ICD-10-CM

## 2021-12-25 NOTE — Progress Notes (Signed)
  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 male presents with the above complaint. History confirmed with patient.  He is always had toe deformities and high arches, this got worse after his stroke.  Most the pain is under the big toe joint on the left  Objective:  Physical Exam: warm, good capillary refill, no trophic changes or ulcerative lesions, normal DP and PT pulses, normal sensory exam, and bilaterally has hammertoe deformities and claw toes and hallux malleus deformity.  Painful callus plantar first MPJ   Radiographs: I reviewed his previous radiographs taken on 12/17/2021 ordered by his PCP, which show contracted digital deformities and hallux valgus Assessment:   1. Hallux extensus, acquired, left   2. Metatarsalgia of left foot   3. Callus of foot   4. Pain in left foot      Plan:  Patient was evaluated and treated and all questions answered.  We discussed treatment of his deformities.  I discussed with him and his wife that typically patients with his symptoms are treated with an IPJ fusion and Jones tenosuspension of the first ray, discussed with him that I think with his age and medical comorbidities he would be a high risk candidate for open surgery and anesthesia.  We discussed the option of an extensor tenotomy of the hallux.  He is fairly minimally ambulatory and regular shoes with a walker.  We discussed the risks and benefits of the procedure including infection further deformity and under correction or overcorrection.  They agreed and wished to proceed today.  Following a local anesthetic block with 1.5 cc of 0.5% Marcaine plain and 2% lidocaine with epinephrine in a field block at the first metatarsal phalangeal joint, a percutaneous tenotomy with a #11 blade was created of the extensor longus  tendon of the hallux.  This reduced the deformity.  Some fibers of the EHB remained and maintained a fairly rectus position of the hallux.  A sterile bandage was applied with Dermabond, Steri-Strips and dry sterile dressings.  Hopefully this is able to correct his pain and alleviate this enough that padding and offloading with a silicone or felt pad in his shoe in the future can eliminate the remainder of his symptoms.  We will see him back in 9 days for a postop check.  They will leave the dressing on for 3 days and then can remove and resume regular bathing.  Return in about 9 days (around 01/03/2022) for post op (no x-rays).

## 2021-12-27 DIAGNOSIS — F419 Anxiety disorder, unspecified: Secondary | ICD-10-CM | POA: Diagnosis not present

## 2021-12-27 DIAGNOSIS — R2689 Other abnormalities of gait and mobility: Secondary | ICD-10-CM | POA: Diagnosis not present

## 2021-12-27 DIAGNOSIS — Z7982 Long term (current) use of aspirin: Secondary | ICD-10-CM | POA: Diagnosis not present

## 2021-12-27 DIAGNOSIS — R296 Repeated falls: Secondary | ICD-10-CM | POA: Diagnosis not present

## 2021-12-27 DIAGNOSIS — I69991 Dysphagia following unspecified cerebrovascular disease: Secondary | ICD-10-CM | POA: Diagnosis not present

## 2021-12-27 DIAGNOSIS — M205X2 Other deformities of toe(s) (acquired), left foot: Secondary | ICD-10-CM | POA: Diagnosis not present

## 2021-12-27 DIAGNOSIS — F32A Depression, unspecified: Secondary | ICD-10-CM | POA: Diagnosis not present

## 2021-12-28 DIAGNOSIS — I69991 Dysphagia following unspecified cerebrovascular disease: Secondary | ICD-10-CM | POA: Diagnosis not present

## 2021-12-28 DIAGNOSIS — M205X2 Other deformities of toe(s) (acquired), left foot: Secondary | ICD-10-CM | POA: Diagnosis not present

## 2021-12-28 DIAGNOSIS — F32A Depression, unspecified: Secondary | ICD-10-CM | POA: Diagnosis not present

## 2021-12-28 DIAGNOSIS — R296 Repeated falls: Secondary | ICD-10-CM | POA: Diagnosis not present

## 2021-12-28 DIAGNOSIS — F419 Anxiety disorder, unspecified: Secondary | ICD-10-CM | POA: Diagnosis not present

## 2021-12-28 DIAGNOSIS — Z7982 Long term (current) use of aspirin: Secondary | ICD-10-CM | POA: Diagnosis not present

## 2021-12-28 DIAGNOSIS — R2689 Other abnormalities of gait and mobility: Secondary | ICD-10-CM | POA: Diagnosis not present

## 2022-01-01 DIAGNOSIS — J3089 Other allergic rhinitis: Secondary | ICD-10-CM | POA: Diagnosis not present

## 2022-01-01 DIAGNOSIS — J3081 Allergic rhinitis due to animal (cat) (dog) hair and dander: Secondary | ICD-10-CM | POA: Diagnosis not present

## 2022-01-01 DIAGNOSIS — J301 Allergic rhinitis due to pollen: Secondary | ICD-10-CM | POA: Diagnosis not present

## 2022-01-02 NOTE — Telephone Encounter (Signed)
Error

## 2022-01-03 ENCOUNTER — Ambulatory Visit (INDEPENDENT_AMBULATORY_CARE_PROVIDER_SITE_OTHER): Payer: PPO

## 2022-01-03 VITALS — BP 126/80 | HR 76

## 2022-01-03 DIAGNOSIS — M205X2 Other deformities of toe(s) (acquired), left foot: Secondary | ICD-10-CM

## 2022-01-03 NOTE — Progress Notes (Signed)
Patient presents today for post op visit # 1st, patient of Dr. Sherryle Lis.   POV # 1 DOS 12/25/21    He presents in his athletic shoe. Denies any falls or injury to the foot. No signs of infection. No calf pain or shortness of breath. Bandages dry and intact. Incision is intact. He denies any pain at this time.    BP: 126/80   P: 76    Band-aid placed over incision site today and placed him back in athletic shoe. Reviewed icing and elevation. He will not need any further follow-ups unless a new problem arises.

## 2022-01-10 DIAGNOSIS — J301 Allergic rhinitis due to pollen: Secondary | ICD-10-CM | POA: Diagnosis not present

## 2022-01-10 DIAGNOSIS — J3089 Other allergic rhinitis: Secondary | ICD-10-CM | POA: Diagnosis not present

## 2022-01-14 ENCOUNTER — Telehealth: Payer: PPO | Admitting: Physician Assistant

## 2022-01-14 DIAGNOSIS — U071 COVID-19: Secondary | ICD-10-CM

## 2022-01-14 MED ORDER — BENZONATATE 100 MG PO CAPS: 100.0000 mg | ORAL_CAPSULE | Freq: Three times a day (TID) | ORAL | 0 refills | Status: DC | PRN

## 2022-01-14 MED ORDER — NIRMATRELVIR/RITONAVIR (PAXLOVID) TABLET (RENAL DOSING): 2.0000 | ORAL_TABLET | Freq: Two times a day (BID) | ORAL | 0 refills | Status: AC

## 2022-01-14 NOTE — Patient Instructions (Signed)
Todd Golden., thank you for joining Mar Daring, PA-C for today's virtual visit.  While this provider is not your primary care provider (PCP), if your PCP is located in our provider database this encounter information will be shared with them immediately following your visit.   [facility] account gives you access to today's visit and all your visits, tests, and labs performed at [facility] " click here if you don't have a [facility] account or go to [email or web address]  Consent: (Patient) Todd Golden. provided verbal consent for this virtual visit at the beginning of the encounter.  Current Medications:  Current Outpatient Medications:    benzonatate (TESSALON) 100 MG capsule, Take 1 capsule (100 mg total) by mouth 3 (three) times daily as needed., Disp: 30 capsule, Rfl: 0   nirmatrelvir/ritonavir, renal dosing, (PAXLOVID) 10 x 150 MG & 10 x '100MG'$  TABS, Take 2 tablets by mouth 2 (two) times daily for 5 days. (Take nirmatrelvir 150 mg one tablet twice daily for 5 days and ritonavir 100 mg one tablet twice daily for 5 days) Patient GFR is 53, Disp: 20 tablet, Rfl: 0   amLODipine (NORVASC) 5 MG tablet, TAKE 1 TABLET(5 MG) BY MOUTH DAILY, Disp: 90 tablet, Rfl: 1   aspirin EC 81 MG tablet, Take 81 mg by mouth daily. Swallow whole., Disp: , Rfl:    donepezil (ARICEPT) 10 MG tablet, Take 10 mg by mouth daily., Disp: , Rfl:    doxazosin (CARDURA) 4 MG tablet, Take 1 tablet (4 mg total) by mouth daily., Disp: 90 tablet, Rfl: 0   EPINEPHrine 0.3 mg/0.3 mL IJ SOAJ injection, Inject 0.3 mg into the muscle as needed for anaphylaxis., Disp: , Rfl:    hydrochlorothiazide (HYDRODIURIL) 25 MG tablet, Take 1 tablet (25 mg total) by mouth daily., Disp: 90 tablet, Rfl: 1   latanoprost (XALATAN) 0.005 % ophthalmic solution, Place 1 drop into both eyes at bedtime., Disp: , Rfl:    Multiple Vitamins-Minerals (PRESERVISION AREDS 2 PO), Take by mouth daily.,  Disp: , Rfl:    omeprazole (PRILOSEC) 40 MG capsule, TAKE 1 CAPSULE(40 MG) BY MOUTH DAILY, Disp: 90 capsule, Rfl: 3   pindolol (VISKEN) 10 MG tablet, TAKE 1 TABLET(10 MG) BY MOUTH DAILY, Disp: 90 tablet, Rfl: 1   simvastatin (ZOCOR) 20 MG tablet, Take 1 tablet (20 mg total) by mouth daily., Disp: 90 tablet, Rfl: 3   Medications ordered in this encounter:  Meds ordered this encounter  Medications   nirmatrelvir/ritonavir, renal dosing, (PAXLOVID) 10 x 150 MG & 10 x '100MG'$  TABS    Sig: Take 2 tablets by mouth 2 (two) times daily for 5 days. (Take nirmatrelvir 150 mg one tablet twice daily for 5 days and ritonavir 100 mg one tablet twice daily for 5 days) Patient GFR is 53    Dispense:  20 tablet    Refill:  0    Order Specific Question:   Supervising Provider    Answer:   Chase Picket [[id number]]   benzonatate (TESSALON) 100 MG capsule    Sig: Take 1 capsule (100 mg total) by mouth 3 (three) times daily as needed.    Dispense:  30 capsule    Refill:  0    Order Specific Question:   Supervising Provider    Answer:   Chase Picket [id number]     *If you need refills on other medications prior to your next appointment, please contact your pharmacy*  Follow-Up:  Call back or seek an in-person evaluation if the symptoms worsen or if the condition fails to improve as anticipated.  [place] [phone]  Other Instructions Can take to lessen severity: Vit C '500mg'$  twice daily Quercertin 250-'500mg'$  twice daily Zinc 75-'100mg'$  daily Melatonin 3-6 mg at bedtime Vit D3 1000-2000 IU daily Aspirin 81 mg daily with food Optional: Famotidine '20mg'$  daily Also can add tylenol/ibuprofen as needed for fevers and body aches May add Mucinex or Mucinex DM as needed for cough/congestion  COVID-19 COVID-19, or coronavirus disease 2019, is an infection that is caused by a new (novel) coronavirus called SARS-CoV-2. COVID-19 can cause many symptoms. In some people, the virus may not  cause any symptoms. In others, it may cause mild or severe symptoms. Some people with severe infection develop severe disease. What are the causes? This illness is caused by a virus. The virus may be in the air as tiny specks of fluid (aerosols) or droplets, or it may be on surfaces. You may catch the virus by: Breathing in droplets from an infected person. Droplets can be spread by a person breathing, speaking, singing, coughing, or sneezing. Touching something, like a table or a doorknob, that has virus on it (is contaminated) and then touching your mouth, nose, or eyes. What increases the risk? Risk for infection: You are more likely to get infected with the COVID-19 virus if: You are within 6 ft (1.8 m) of a person with COVID-19 for 15 minutes or longer. You are providing care for a person who is infected with COVID-19. You are in close personal contact with other people. Close personal contact includes hugging, kissing, or sharing eating or drinking utensils. Risk for serious illness caused by COVID-19: You are more likely to get seriously ill from the COVID-19 virus if: You have cancer. You have a long-term (chronic) disease, such as: Chronic lung disease. This includes pulmonary embolism, chronic obstructive pulmonary disease, and cystic fibrosis. Long-term disease that lowers your body's ability to fight infection (immunocompromise). Serious cardiac conditions, such as heart failure, coronary artery disease, or cardiomyopathy. Diabetes. Chronic kidney disease. Liver diseases. These include cirrhosis, nonalcoholic fatty liver disease, alcoholic liver disease, or autoimmune hepatitis. You have obesity. You are pregnant or were recently pregnant. You have sickle cell disease. What are the signs or symptoms? Symptoms of this condition can range from mild to severe. Symptoms may appear any time from 2 to 14 days after being exposed to the virus. They include: Fever or chills. Shortness  of breath or trouble breathing. Feeling tired or very tired. Headaches, body aches, or muscle aches. Runny or stuffy nose, sneezing, coughing, or sore throat. New loss of taste or smell. This is rare. Some people may also have stomach problems, such as nausea, vomiting, or diarrhea. Other people may not have any symptoms of COVID-19. How is this diagnosed? This condition may be diagnosed by testing samples to check for the COVID-19 virus. The most common tests are the PCR test and the antigen test. Tests may be done in the lab or at home. They include: Using a swab to take a sample of fluid from the back of your nose and throat (nasopharyngeal fluid), from your nose, or from your throat. Testing a sample of saliva from your mouth. Testing a sample of coughed-up mucus from your lungs (sputum). How is this treated? Treatment for COVID-19 infection depends on the severity of the condition. Mild symptoms can be managed at home with rest, fluids, and  over-the-counter medicines. Serious symptoms may be treated in a hospital intensive care unit (ICU). Treatment in the ICU may include: Supplemental oxygen. Extra oxygen is given through a tube in the nose, a face mask, or a hood. Medicines. These may include: Antivirals, such as monoclonal antibodies. These help your body fight off certain viruses that can cause disease. Anti-inflammatories, such as corticosteroids. These reduce inflammation and suppress the immune system. Antithrombotics. These prevent or treat blood clots, if they develop. Convalescent plasma. This helps boost your immune system, if you have an underlying immunosuppressive condition or are getting immunosuppressive treatments. Prone positioning. This means you will lie on your stomach. This helps oxygen to get into your lungs. Infection control measures. If you are at risk for more serious illness caused by COVID-19, your health care provider may prescribe two long-acting monoclonal  antibodies, given together every 6 months. How is this prevented? To protect yourself: Use preventive medicine (pre-exposure prophylaxis). You may get pre-exposure prophylaxis if you have moderate or severe immunocompromise. Get vaccinated. Anyone 70 months old or older who meets guidelines can get a COVID-19 vaccine or vaccine series. This includes people who are pregnant or making breast milk (lactating). Get an added dose of COVID-19 vaccine after your first vaccine or vaccine series if you have moderate to severe immunocompromise. This applies if you have had a solid organ transplant or have been diagnosed with an immunocompromising condition. You should get the added dose 4 weeks after you got the first COVID-19 vaccine or vaccine series. If you get an mRNA vaccine, you will need a 3-dose primary series. If you get the J&J/Janssen vaccine, you will need a 2-dose primary series, with the second dose being an mRNA vaccine. Talk to your health care provider about getting experimental monoclonal antibodies. This treatment is approved under emergency use authorization to prevent severe illness before or after being exposed to the COVID-19 virus. You may be given monoclonal antibodies if: You have moderate or severe immunocompromise. This includes treatments that lower your immune response. People with immunocompromise may not develop protection against COVID-19 when they are vaccinated. You cannot be vaccinated. You may not get a vaccine if you have a severe allergic reaction to the vaccine or its components. You are not fully vaccinated. You are in a facility where COVID-19 is present and: Are in close contact with a person who is infected with the COVID-19 virus. Are at high risk of being exposed to the COVID-19 virus. You are at risk of illness from new variants of the COVID-19 virus. To protect others: If you have symptoms of COVID-19, take steps to prevent the virus from spreading to  others. Stay home. Leave your house only to get medical care. Do not use public transit, if possible. Do not travel while you are sick. Wash your hands often with soap and water for at least 20 seconds. If soap and water are not available, use alcohol-based hand sanitizer. Make sure that all people in your household wash their hands well and often. Cough or sneeze into a tissue or your sleeve or elbow. Do not cough or sneeze into your hand or into the air. Where to find more information Centers for Disease Control and Prevention: CharmCourses.be World Health Organization: https://www.castaneda.info/ Get help right away if: You have trouble breathing. You have pain or pressure in your chest. You are confused. You have bluish lips and fingernails. You have trouble waking from sleep. You have symptoms that get worse. These symptoms may be  an emergency. Get help right away. Call 911. Do not wait to see if the symptoms will go away. Do not drive yourself to the hospital. Summary COVID-19 is an infection that is caused by a new coronavirus. Sometimes, there are no symptoms. Other times, symptoms range from mild to severe. Some people with a severe COVID-19 infection develop severe disease. The virus that causes COVID-19 can spread from person to person through droplets or aerosols from breathing, speaking, singing, coughing, or sneezing. Mild symptoms of COVID-19 can be managed at home with rest, fluids, and over-the-counter medicines. This information is not intended to replace advice given to you by your health care provider. Make sure you discuss any questions you have with your health care provider. Document Revised: 12/26/2020 Document Reviewed: 12/28/2020 Elsevier Patient Education  [place].    If you have been instructed to have an in-person evaluation today at a local Urgent Care facility, please use the link below. It will take you to a list of all of our  available [facility], including address, phone number and hours of operation. Please do not delay care.  [facility]  If you or a family member do not have a primary care provider, use the link below to schedule a visit and establish care. When you choose a [facility] primary care physician or advanced practice provider, you gain a long-term partner in health. Find a Primary Care Provider  Learn more about 's in-office and virtual care options: [facility]

## 2022-01-14 NOTE — Progress Notes (Signed)
Virtual Visit Consent   Todd Golden., you are scheduled for a virtual visit with a [facility] provider today. Just as with appointments in the office, your consent must be obtained to participate. Your consent will be active for this visit and any virtual visit you may have with one of our providers in the next 365 days. If you have a MyChart account, a copy of this consent can be sent to you electronically.  As this is a virtual visit, video technology does not allow for your provider to perform a traditional examination. This may limit your provider's ability to fully assess your condition. If your provider identifies any concerns that need to be evaluated in person or the need to arrange testing (such as labs, EKG, etc.), we will make arrangements to do so. Although advances in technology are sophisticated, we cannot ensure that it will always work on either your end or our end. If the connection with a video visit is poor, the visit may have to be switched to a telephone visit. With either a video or telephone visit, we are not always able to ensure that we have a secure connection.  By engaging in this virtual visit, you consent to the provision of healthcare and authorize for your insurance to be billed (if applicable) for the services provided during this visit. Depending on your insurance coverage, you may receive a charge related to this service.  I need to obtain your verbal consent now. Are you willing to proceed with your visit today? Todd Golden. has provided verbal consent on 01/14/2022 for a virtual visit (video or telephone). Todd Daring, PA-C  Date: 01/14/2022 6:50 PM  Virtual Visit via Video Note   I, Todd Golden, connected with  Todd Golden.  ([id number], [id number]) on 01/14/22 at  7:00 PM EST by a video-enabled telemedicine application and verified that I am speaking with the correct person using two identifiers. He has aphasia so his wife,  Todd Golden, assisted with the visit.  Location: Patient: Virtual Visit Location Patient: Home Provider: Virtual Visit Location Provider: Home Office   I discussed the limitations of evaluation and management by telemedicine and the availability of in person appointments. The patient expressed understanding and agreed to proceed.    History of Present Illness: Todd Golden. is a 88 y.o. who identifies as a male who was assigned male at birth, and is being seen today for Covid 37.  HPI: URI  This is a new problem. Episode onset: Symptoms started yesterday; tested positive for covid 19 today. The problem has been gradually worsening. There has been no fever. Associated symptoms include congestion, coughing and rhinorrhea. Pertinent negatives include no ear pain, headaches, plugged ear sensation, sinus pain or sore throat. He has tried decongestant (cough medication) for the symptoms. The treatment provided no relief.     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 12/18/2021   Swallowing dysfunction 12/18/2021   Memory loss, short term 01/04/2021   Allergic rhinitis due to animal (cat) (dog) hair and dander 08/16/2020   Allergic rhinitis due to pollen 08/16/2020   Proximal leg weakness 08/03/2020   Hx of completed stroke 02/02/2020   Weakness 02/02/2020   Diffuse brain atrophy ([place]) 12/09/2019   Balance problem 12/03/2019   Acquired thrombophilia ([place]) 12/03/2019   Prediabetes 05/29/2019  Benign prostatic hyperplasia with urinary frequency 08/20/2018   Hearing aid worn 08/16/2017   Expressive aphasia 08/16/2017   History of cataract surgery 08/27/2016   Age-related macular degeneration 08/27/2016   Counseling regarding end of life decision making 08/27/2016   Do not resuscitate status 08/27/2016   BPH associated with nocturia 02/11/2015   PAF (paroxysmal atrial  fibrillation) ([place]) 12/14/2013   Hyperlipidemia LDL goal <100 12/12/2013   CVD (cerebrovascular disease) 12/10/2013   GERD (gastroesophageal reflux disease) 07/25/2013   Bilateral hand numbness 07/25/2013   Chronic constipation 01/21/2013   Encounter for preventive health examination 01/21/2013   Essential hypertension, benign 10/21/2012   Glaucoma 10/21/2012   History of colonic polyps 10/21/2012   Ureteral stricture, right 10/21/2012    Allergies: No Known Allergies Medications:  Current Outpatient Medications:    benzonatate (TESSALON) 100 MG capsule, Take 1 capsule (100 mg total) by mouth 3 (three) times daily as needed., Disp: 30 capsule, Rfl: 0   nirmatrelvir/ritonavir, renal dosing, (PAXLOVID) 10 x 150 MG & 10 x '100MG'$  TABS, Take 2 tablets by mouth 2 (two) times daily for 5 days. (Take nirmatrelvir 150 mg one tablet twice daily for 5 days and ritonavir 100 mg one tablet twice daily for 5 days) Patient GFR is 53, Disp: 20 tablet, Rfl: 0   amLODipine (NORVASC) 5 MG tablet, TAKE 1 TABLET(5 MG) BY MOUTH DAILY, Disp: 90 tablet, Rfl: 1   aspirin EC 81 MG tablet, Take 81 mg by mouth daily. Swallow whole., Disp: , Rfl:    donepezil (ARICEPT) 10 MG tablet, Take 10 mg by mouth daily., Disp: , Rfl:    doxazosin (CARDURA) 4 MG tablet, Take 1 tablet (4 mg total) by mouth daily., Disp: 90 tablet, Rfl: 0   EPINEPHrine 0.3 mg/0.3 mL IJ SOAJ injection, Inject 0.3 mg into the muscle as needed for anaphylaxis., Disp: , Rfl:    hydrochlorothiazide (HYDRODIURIL) 25 MG tablet, Take 1 tablet (25 mg total) by mouth daily., Disp: 90 tablet, Rfl: 1   latanoprost (XALATAN) 0.005 % ophthalmic solution, Place 1 drop into both eyes at bedtime., Disp: , Rfl:    Multiple Vitamins-Minerals (PRESERVISION AREDS 2 PO), Take by mouth daily., Disp: , Rfl:    omeprazole (PRILOSEC) 40 MG capsule, TAKE 1 CAPSULE(40 MG) BY MOUTH DAILY, Disp: 90 capsule, Rfl: 3   pindolol (VISKEN) 10 MG tablet, TAKE 1 TABLET(10 MG) BY MOUTH  DAILY, Disp: 90 tablet, Rfl: 1   simvastatin (ZOCOR) 20 MG tablet, Take 1 tablet (20 mg total) by mouth daily., Disp: 90 tablet, Rfl: 3  Observations/Objective: Patient is well-developed, well-nourished in no acute distress.  Resting comfortably at home.  Head is normocephalic, atraumatic.  No labored breathing.  Speech is clear and coherent with logical content.  Patient is alert and oriented at baseline.    Assessment and Plan: 1. COVID-19 - nirmatrelvir/ritonavir, renal dosing, (PAXLOVID) 10 x 150 MG & 10 x '100MG'$  TABS; Take 2 tablets by mouth 2 (two) times daily for 5 days. (Take nirmatrelvir 150 mg one tablet twice daily for 5 days and ritonavir 100 mg one tablet twice daily for 5 days) Patient GFR is 53  Dispense: 20 tablet; Refill: 0 - benzonatate (TESSALON) 100 MG capsule; Take 1 capsule (100 mg total) by mouth 3 (three) times daily as needed.  Dispense: 30 capsule; Refill: 0  - Continue OTC symptomatic management of choice - Will send OTC vitamins and supplement information through AVS - Paxlovid renal dose prescribed; HOLD Simvastatin '20mg'$  - Patient  enrolled in MyChart symptom monitoring - Push fluids - Rest as needed - Discussed return precautions and when to seek in-person evaluation, sent via AVS as well   Follow Up Instructions: I discussed the assessment and treatment plan with the patient. The patient was provided an opportunity to ask questions and all were answered. The patient agreed with the plan and demonstrated an understanding of the instructions.  A copy of instructions were sent to the patient via MyChart unless otherwise noted below.    The patient was advised to call back or seek an in-person evaluation if the symptoms worsen or if the condition fails to improve as anticipated.  Time:  I spent 10 minutes with the patient via telehealth technology discussing the above problems/concerns.    Todd Daring, PA-C

## 2022-01-18 ENCOUNTER — Telehealth: Payer: Self-pay | Admitting: Internal Medicine

## 2022-01-18 NOTE — Telephone Encounter (Signed)
Pt physical therapy Dasy, from [facility] called in today, requesting permission from provider to extend pt physical therapy and to let her provider knows that he had 2 falls this week- Sunday and Monday. She can be reached at [phone] if unavailable leave a brief message and she will cal back

## 2022-01-18 NOTE — Telephone Encounter (Signed)
Verbal order was given to extend pt's PT. Marzetta Board also stated that pt come down with Covid but seems to be recovering. She also thinks that him having covid contributed to the two falls he had this week.

## 2022-01-22 DIAGNOSIS — J301 Allergic rhinitis due to pollen: Secondary | ICD-10-CM | POA: Diagnosis not present

## 2022-01-22 DIAGNOSIS — J3089 Other allergic rhinitis: Secondary | ICD-10-CM | POA: Diagnosis not present

## 2022-01-22 DIAGNOSIS — J3081 Allergic rhinitis due to animal (cat) (dog) hair and dander: Secondary | ICD-10-CM | POA: Diagnosis not present

## 2022-01-24 DIAGNOSIS — M205X2 Other deformities of toe(s) (acquired), left foot: Secondary | ICD-10-CM | POA: Diagnosis not present

## 2022-01-24 DIAGNOSIS — Z7982 Long term (current) use of aspirin: Secondary | ICD-10-CM | POA: Diagnosis not present

## 2022-01-24 DIAGNOSIS — F32A Depression, unspecified: Secondary | ICD-10-CM | POA: Diagnosis not present

## 2022-01-24 DIAGNOSIS — R2689 Other abnormalities of gait and mobility: Secondary | ICD-10-CM | POA: Diagnosis not present

## 2022-01-24 DIAGNOSIS — R296 Repeated falls: Secondary | ICD-10-CM | POA: Diagnosis not present

## 2022-01-24 DIAGNOSIS — I69991 Dysphagia following unspecified cerebrovascular disease: Secondary | ICD-10-CM | POA: Diagnosis not present

## 2022-01-24 DIAGNOSIS — F419 Anxiety disorder, unspecified: Secondary | ICD-10-CM | POA: Diagnosis not present

## 2022-01-27 ENCOUNTER — Other Ambulatory Visit: Payer: Self-pay | Admitting: Internal Medicine

## 2022-01-28 NOTE — Telephone Encounter (Signed)
Okay to refuse? Medication was discontinued on 11/02/2021.

## 2022-01-31 DIAGNOSIS — J301 Allergic rhinitis due to pollen: Secondary | ICD-10-CM | POA: Diagnosis not present

## 2022-01-31 DIAGNOSIS — J3089 Other allergic rhinitis: Secondary | ICD-10-CM | POA: Diagnosis not present

## 2022-02-01 DIAGNOSIS — H353211 Exudative age-related macular degeneration, right eye, with active choroidal neovascularization: Secondary | ICD-10-CM | POA: Diagnosis not present

## 2022-02-05 ENCOUNTER — Other Ambulatory Visit: Payer: Self-pay

## 2022-02-05 DIAGNOSIS — J301 Allergic rhinitis due to pollen: Secondary | ICD-10-CM | POA: Diagnosis not present

## 2022-02-05 DIAGNOSIS — J3089 Other allergic rhinitis: Secondary | ICD-10-CM | POA: Diagnosis not present

## 2022-02-05 MED ORDER — AMLODIPINE BESYLATE 5 MG PO TABS: ORAL_TABLET | ORAL | 1 refills | Status: DC

## 2022-02-12 DIAGNOSIS — J301 Allergic rhinitis due to pollen: Secondary | ICD-10-CM | POA: Diagnosis not present

## 2022-02-12 DIAGNOSIS — J3089 Other allergic rhinitis: Secondary | ICD-10-CM | POA: Diagnosis not present

## 2022-02-13 ENCOUNTER — Other Ambulatory Visit: Payer: Self-pay | Admitting: Internal Medicine

## 2022-02-15 ENCOUNTER — Ambulatory Visit: Payer: PPO | Attending: Cardiovascular Disease | Admitting: Cardiovascular Disease

## 2022-02-15 ENCOUNTER — Encounter: Payer: Self-pay | Admitting: Cardiovascular Disease

## 2022-02-15 VITALS — BP 121/69 | HR 82 | Ht 71.0 in | Wt 189.2 lb

## 2022-02-15 DIAGNOSIS — I482 Chronic atrial fibrillation, unspecified: Secondary | ICD-10-CM | POA: Diagnosis not present

## 2022-02-15 DIAGNOSIS — I10 Essential (primary) hypertension: Secondary | ICD-10-CM

## 2022-02-15 NOTE — Progress Notes (Signed)
Cardiology Office Note   Date:  02/15/2022   ID:  Todd Mcanany., DOB 01-Nov-1930, MRN [id number]  PCP:  Crecencio Mc, MD  Cardiologist:   Kathlyn Sacramento, MD   Chief Complaint  Patient presents with   Other    6 month f/u c/o feeling unsteady. Meds reviewed verbally with pt.      History of Present Illness: Todd Mcmartin. is a 89 y.o. male who presents for a follow-up visit regarding permanent atrial fibrillation.  Other medical problems include previous stroke, hypertension, hyperlipidemia, and glaucoma.  In November 2015, he was admitted with expressive aphasia and mild ataxia due to left MCA stroke  in the setting of atrial fibrillation.  Echocardiogram showed normal LV function well carotid Doppler showed no obstructive disease.  He was placed on Eliquis.  He moved out of [facility] with his wife and currently they live at the house and [place].    He was taken off anticoagulation with Eliquis due to recurrent falls.  He has been doing well with no chest pain, shortness of breath or palpitations.  He does have dementia as well.   Past Medical History:  Diagnosis Date   Allergy    Arthritis    Asthma    Cataract    Chronic constipation    Colon polyps    Cough    GERD (gastroesophageal reflux disease)    Glaucoma    HOH (hard of hearing)    a. Uses hearing aids   Hyperlipidemia    Hypertension    Macular degeneration    Nephrolithiasis    stent   PAF (paroxysmal atrial fibrillation) ([place])    a. 11/2013 Dx in setting of L MCA stroke. CHA2DS2VASc = 5-->Eliquis; b. 11/2013 Echo: Nl LV fxn. Mildly dil LA.   Stroke [facility])    a. 11/2013 L MCA stroke.   Ureteral stricture     Past Surgical History:  Procedure Laterality Date   CATARACT EXTRACTION W/PHACO Left 12/20/2014   Procedure: CATARACT EXTRACTION PHACO AND INTRAOCULAR LENS PLACEMENT (IOC);  Surgeon: Birder Robson, MD;  Location: [facility] ORS;  Service: Ophthalmology;  Laterality: Left;   [place]      00:59.6 AP%    21.2 CDE    12.66 fluid pack lot #[id number]   JOINT REPLACEMENT Left 2014   knee partial replacement   kidney stent Right 1995     Current Outpatient Medications  Medication Sig Dispense Refill   amLODipine (NORVASC) 5 MG tablet TAKE 1 TABLET(5 MG) BY MOUTH DAILY 90 tablet 1   aspirin EC 81 MG tablet Take 81 mg by mouth daily. Swallow whole.     benzonatate (TESSALON) 100 MG capsule Take 1 capsule (100 mg total) by mouth 3 (three) times daily as needed. 30 capsule 0   donepezil (ARICEPT) 10 MG tablet Take 10 mg by mouth daily.     doxazosin (CARDURA) 4 MG tablet Take 1 tablet (4 mg total) by mouth daily. 90 tablet 0   EPINEPHrine 0.3 mg/0.3 mL IJ SOAJ injection Inject 0.3 mg into the muscle as needed for anaphylaxis.     hydrochlorothiazide (HYDRODIURIL) 25 MG tablet Take 1 tablet (25 mg total) by mouth daily. 90 tablet 1   latanoprost (XALATAN) 0.005 % ophthalmic solution Place 1 drop into both eyes at bedtime.     Multiple Vitamins-Minerals (PRESERVISION AREDS 2 PO) Take by mouth daily.     omeprazole (PRILOSEC) 40 MG capsule TAKE 1 CAPSULE(40 MG) BY  MOUTH DAILY 90 capsule 3   pindolol (VISKEN) 10 MG tablet TAKE 1 TABLET(10 MG) BY MOUTH DAILY 90 tablet 1   sertraline (ZOLOFT) 50 MG tablet Take 50 mg by mouth daily.     No current facility-administered medications for this visit.    Allergies:   Patient has no known allergies.    Social History:  The patient  reports that he has never smoked. He has never been exposed to tobacco smoke. He has never used smokeless tobacco. He reports current alcohol use. He reports that he does not use drugs.   Family History:  The patient's family history includes Arthritis in his mother; Cancer (age of onset: 47) in his mother; Stroke in his father.    ROS:  Please see the history of present illness.   Otherwise, review of systems are positive for none.   All other systems are reviewed and negative.    PHYSICAL EXAM: VS:  BP  121/69 (BP Location: Right Arm, Patient Position: Sitting, Cuff Size: Normal)   Pulse 82   Ht '5\' 11"'$  (1.803 m)   Wt 189 lb 4 oz (85.8 kg)   SpO2 98%   BMI 26.40 kg/m  , BMI Body mass index is 26.4 kg/m. GEN: Well nourished, well developed, in no acute distress  HEENT: normal  Neck: no JVD, carotid bruits, or masses Cardiac: Irregularly irregular; no murmurs, rubs, or gallops,no edema  Respiratory:  clear to auscultation bilaterally, normal work of breathing GI: soft, nontender, nondistended, + BS MS: no deformity or atrophy  Skin: warm and dry, no rash Neuro:  Strength and sensation are intact Psych: euthymic mood, full affect   EKG:  EKG is ordered today. The ekg ordered today demonstrates atrial fibrillation with a PVC.  Left anterior fascicular block, LVH with repolarization abnormalities.   Recent Labs: 11/02/2021: ALT 21; BUN 23; Creatinine, Ser 1.21; Hemoglobin 14.0; Platelets 232.0; Potassium 4.2; Sodium 138; TSH 1.54    Lipid Panel    Component Value Date/Time   CHOL 149 11/02/2021 0921   CHOL 133 12/18/2020 0819   CHOL 168 12/04/2013 0549   TRIG 111.0 11/02/2021 0921   TRIG 96 12/04/2013 0549   HDL 53.90 11/02/2021 0921   HDL 55 12/18/2020 0819   HDL 44 12/04/2013 0549   CHOLHDL 3 11/02/2021 0921   VLDL 22.2 11/02/2021 0921   VLDL 19 12/04/2013 0549   LDLCALC 73 11/02/2021 0921   LDLCALC 60 12/18/2020 0819   LDLCALC 105 (H) 12/04/2013 0549      Wt Readings from Last 3 Encounters:  02/15/22 189 lb 4 oz (85.8 kg)  12/17/21 188 lb (85.3 kg)  11/02/21 187 lb 12.8 oz (85.2 kg)            No data to display            ASSESSMENT AND PLAN:  1.  Permanent atrial fibrillation: This was documented in 2015 in the setting of left MCA stroke.  Ventricular rate is reasonably controlled with pindolol.  He is no longer on anticoagulation with Eliquis due to recurrent falls and significant physical injuries.  2.  Essential hypertension: Blood pressure is  controlled on current medications.      Disposition:   FU with me in 12 months.  Signed,  Kathlyn Sacramento, MD  02/15/2022 10:00 AM    [place]

## 2022-02-15 NOTE — Patient Instructions (Signed)
Medication Instructions:  Your physician recommends that you continue on your current medications as directed. Please refer to the Current Medication list given to you today.  *If you need a refill on your cardiac medications before your next appointment, please call your pharmacy*   Lab Work: None ordered  If you have labs (blood work) drawn today and your tests are completely normal, you will receive your results only by: MyChart Message (if you have MyChart) OR A paper copy in the mail If you have any lab test that is abnormal or we need to change your treatment, we will call you to review the results.   Testing/Procedures: None ordered   Follow-Up: At [facility], you and your health needs are our priority.  As part of our continuing mission to provide you with exceptional heart care, we have created designated Provider Care Teams.  These Care Teams include your primary Cardiologist (physician) and Advanced Practice Providers (APPs -  Physician Assistants and Nurse Practitioners) who all work together to provide you with the care you need, when you need it.  We recommend signing up for the patient portal called "MyChart".  Sign up information is provided on this After Visit Summary.  MyChart is used to connect with patients for Virtual Visits (Telemedicine).  Patients are able to view lab/test results, encounter notes, upcoming appointments, etc.  Non-urgent messages can be sent to your provider as well.   To learn more about what you can do with MyChart, go to [email or web address].    Your next appointment:   1 year(s)  Provider:   You may see Muhammad Arida, MD or one of the following Advanced Practice Providers on your designated Care Team:   Christopher Berge, NP Ryan Dunn, PA-C Cadence Furth, PA-C Sheri Hammock, NP 

## 2022-02-19 DIAGNOSIS — J3081 Allergic rhinitis due to animal (cat) (dog) hair and dander: Secondary | ICD-10-CM | POA: Diagnosis not present

## 2022-02-19 DIAGNOSIS — J301 Allergic rhinitis due to pollen: Secondary | ICD-10-CM | POA: Diagnosis not present

## 2022-02-19 DIAGNOSIS — J3089 Other allergic rhinitis: Secondary | ICD-10-CM | POA: Diagnosis not present

## 2022-02-26 DIAGNOSIS — J3081 Allergic rhinitis due to animal (cat) (dog) hair and dander: Secondary | ICD-10-CM | POA: Diagnosis not present

## 2022-02-26 DIAGNOSIS — J3089 Other allergic rhinitis: Secondary | ICD-10-CM | POA: Diagnosis not present

## 2022-02-26 DIAGNOSIS — J301 Allergic rhinitis due to pollen: Secondary | ICD-10-CM | POA: Diagnosis not present

## 2022-03-05 DIAGNOSIS — J301 Allergic rhinitis due to pollen: Secondary | ICD-10-CM | POA: Diagnosis not present

## 2022-03-05 DIAGNOSIS — J3081 Allergic rhinitis due to animal (cat) (dog) hair and dander: Secondary | ICD-10-CM | POA: Diagnosis not present

## 2022-03-05 DIAGNOSIS — J3089 Other allergic rhinitis: Secondary | ICD-10-CM | POA: Diagnosis not present

## 2022-03-12 DIAGNOSIS — J3089 Other allergic rhinitis: Secondary | ICD-10-CM | POA: Diagnosis not present

## 2022-03-12 DIAGNOSIS — J301 Allergic rhinitis due to pollen: Secondary | ICD-10-CM | POA: Diagnosis not present

## 2022-03-15 ENCOUNTER — Emergency Department: Payer: PPO

## 2022-03-15 ENCOUNTER — Inpatient Hospital Stay
Admission: EM | Admit: 2022-03-15 | Discharge: 2022-03-19 | DRG: 065 | Disposition: A | Discharge status: Rehabilitation Facility | Payer: PPO | Attending: Internal Medicine | Admitting: Internal Medicine

## 2022-03-15 ENCOUNTER — Other Ambulatory Visit: Payer: Self-pay

## 2022-03-15 DIAGNOSIS — F32A Depression, unspecified: Secondary | ICD-10-CM | POA: Diagnosis present

## 2022-03-15 DIAGNOSIS — R29704 NIHSS score 4: Secondary | ICD-10-CM | POA: Diagnosis not present

## 2022-03-15 DIAGNOSIS — I110 Hypertensive heart disease with heart failure: Secondary | ICD-10-CM | POA: Diagnosis present

## 2022-03-15 DIAGNOSIS — F0153 Vascular dementia, unspecified severity, with mood disturbance: Secondary | ICD-10-CM | POA: Insufficient documentation

## 2022-03-15 DIAGNOSIS — I5022 Chronic systolic (congestive) heart failure: Secondary | ICD-10-CM | POA: Diagnosis not present

## 2022-03-15 DIAGNOSIS — I10 Essential (primary) hypertension: Secondary | ICD-10-CM | POA: Diagnosis not present

## 2022-03-15 DIAGNOSIS — Z96652 Presence of left artificial knee joint: Secondary | ICD-10-CM | POA: Diagnosis present

## 2022-03-15 DIAGNOSIS — I63231 Cerebral infarction due to unspecified occlusion or stenosis of right carotid arteries: Secondary | ICD-10-CM | POA: Diagnosis not present

## 2022-03-15 DIAGNOSIS — H3530 Unspecified macular degeneration: Secondary | ICD-10-CM | POA: Diagnosis not present

## 2022-03-15 DIAGNOSIS — K219 Gastro-esophageal reflux disease without esophagitis: Secondary | ICD-10-CM | POA: Diagnosis present

## 2022-03-15 DIAGNOSIS — I444 Left anterior fascicular block: Secondary | ICD-10-CM | POA: Diagnosis not present

## 2022-03-15 DIAGNOSIS — R296 Repeated falls: Secondary | ICD-10-CM | POA: Diagnosis present

## 2022-03-15 DIAGNOSIS — H409 Unspecified glaucoma: Secondary | ICD-10-CM | POA: Diagnosis not present

## 2022-03-15 DIAGNOSIS — M1990 Unspecified osteoarthritis, unspecified site: Secondary | ICD-10-CM | POA: Diagnosis not present

## 2022-03-15 DIAGNOSIS — R29898 Other symptoms and signs involving the musculoskeletal system: Secondary | ICD-10-CM

## 2022-03-15 DIAGNOSIS — Z79899 Other long term (current) drug therapy: Secondary | ICD-10-CM

## 2022-03-15 DIAGNOSIS — I480 Paroxysmal atrial fibrillation: Secondary | ICD-10-CM | POA: Diagnosis not present

## 2022-03-15 DIAGNOSIS — Z8261 Family history of arthritis: Secondary | ICD-10-CM

## 2022-03-15 DIAGNOSIS — Z9842 Cataract extraction status, left eye: Secondary | ICD-10-CM

## 2022-03-15 DIAGNOSIS — E785 Hyperlipidemia, unspecified: Secondary | ICD-10-CM | POA: Diagnosis present

## 2022-03-15 DIAGNOSIS — Z87442 Personal history of urinary calculi: Secondary | ICD-10-CM | POA: Diagnosis not present

## 2022-03-15 DIAGNOSIS — I69320 Aphasia following cerebral infarction: Secondary | ICD-10-CM

## 2022-03-15 DIAGNOSIS — R130 Aphagia: Secondary | ICD-10-CM | POA: Diagnosis not present

## 2022-03-15 DIAGNOSIS — W2201XA Walked into wall, initial encounter: Secondary | ICD-10-CM | POA: Diagnosis present

## 2022-03-15 DIAGNOSIS — Z823 Family history of stroke: Secondary | ICD-10-CM

## 2022-03-15 DIAGNOSIS — I6389 Other cerebral infarction: Secondary | ICD-10-CM | POA: Diagnosis not present

## 2022-03-15 DIAGNOSIS — I69341 Monoplegia of lower limb following cerebral infarction affecting right dominant side: Secondary | ICD-10-CM | POA: Diagnosis not present

## 2022-03-15 DIAGNOSIS — Z86010 Personal history of colonic polyps: Secondary | ICD-10-CM | POA: Diagnosis not present

## 2022-03-15 DIAGNOSIS — I639 Cerebral infarction, unspecified: Secondary | ICD-10-CM | POA: Diagnosis present

## 2022-03-15 DIAGNOSIS — Z66 Do not resuscitate: Secondary | ICD-10-CM | POA: Diagnosis present

## 2022-03-15 DIAGNOSIS — Z043 Encounter for examination and observation following other accident: Secondary | ICD-10-CM | POA: Diagnosis not present

## 2022-03-15 DIAGNOSIS — Z7982 Long term (current) use of aspirin: Secondary | ICD-10-CM

## 2022-03-15 HISTORY — DX: Cerebral infarction, unspecified: I63.9

## 2022-03-15 LAB — CBC
HCT: 41.9 % (ref 39.0–52.0)
Hemoglobin: 13.9 g/dL (ref 13.0–17.0)
MCH: 30.4 pg (ref 26.0–34.0)
MCHC: 33.2 g/dL (ref 30.0–36.0)
MCV: 91.7 fL (ref 80.0–100.0)
Platelets: 244 10*3/uL (ref 150–400)
RBC: 4.57 MIL/uL (ref 4.22–5.81)
RDW: 13.4 % (ref 11.5–15.5)
WBC: 7.6 10*3/uL (ref 4.0–10.5)
nRBC: 0 % (ref 0.0–0.2)

## 2022-03-15 LAB — DIFFERENTIAL
Abs Immature Granulocytes: 0.02 10*3/uL (ref 0.00–0.07)
Basophils Absolute: 0 10*3/uL (ref 0.0–0.1)
Basophils Relative: 0 %
Eosinophils Absolute: 0.2 10*3/uL (ref 0.0–0.5)
Eosinophils Relative: 3 %
Immature Granulocytes: 0 %
Lymphocytes Relative: 26 %
Lymphs Abs: 2 10*3/uL (ref 0.7–4.0)
Monocytes Absolute: 1.1 10*3/uL — ABNORMAL HIGH (ref 0.1–1.0)
Monocytes Relative: 14 %
Neutro Abs: 4.3 10*3/uL (ref 1.7–7.7)
Neutrophils Relative %: 57 %

## 2022-03-15 LAB — COMPREHENSIVE METABOLIC PANEL
ALT: 25 U/L (ref 0–44)
AST: 31 U/L (ref 15–41)
Albumin: 4 g/dL (ref 3.5–5.0)
Alkaline Phosphatase: 47 U/L (ref 38–126)
Anion gap: 11 (ref 5–15)
BUN: 20 mg/dL (ref 8–23)
CO2: 26 mmol/L (ref 22–32)
Calcium: 9.3 mg/dL (ref 8.9–10.3)
Chloride: 99 mmol/L (ref 98–111)
Creatinine, Ser: 1.22 mg/dL (ref 0.61–1.24)
GFR, Estimated: 57 mL/min — ABNORMAL LOW (ref >60)
Glucose, Bld: 101 mg/dL — ABNORMAL HIGH (ref 70–99)
Potassium: 3.9 mmol/L (ref 3.5–5.1)
Sodium: 136 mmol/L (ref 135–145)
Total Bilirubin: 0.8 mg/dL (ref 0.3–1.2)
Total Protein: 7.1 g/dL (ref 6.5–8.1)

## 2022-03-15 LAB — APTT: aPTT: 26 seconds (ref 24–36)

## 2022-03-15 LAB — TROPONIN I (HIGH SENSITIVITY): Troponin I (High Sensitivity): 12 ng/L (ref <18)

## 2022-03-15 LAB — PROTIME-INR
INR: 1.1 (ref 0.8–1.2)
Prothrombin Time: 13.8 seconds (ref 11.4–15.2)

## 2022-03-15 MED ORDER — TRAZODONE HCL 50 MG PO TABS: 25.0000 mg | ORAL_TABLET | Freq: Every evening | ORAL | Status: DC | PRN

## 2022-03-15 MED ORDER — HYDROCHLOROTHIAZIDE 25 MG PO TABS: 25.0000 mg | ORAL_TABLET | Freq: Every day | ORAL | Status: DC

## 2022-03-15 MED ORDER — DOXAZOSIN MESYLATE 4 MG PO TABS
4.0000 mg | ORAL_TABLET | Freq: Every day | ORAL | Status: DC
  Filled 2022-03-15: qty 1

## 2022-03-15 MED ORDER — ACETAMINOPHEN 325 MG PO TABS: 650.0000 mg | ORAL_TABLET | Freq: Four times a day (QID) | ORAL | Status: DC | PRN

## 2022-03-15 MED ORDER — MAGNESIUM HYDROXIDE 400 MG/5ML PO SUSP: 30.0000 mL | Freq: Every day | ORAL | Status: DC | PRN

## 2022-03-15 MED ORDER — LATANOPROST 0.005 % OP SOLN
1.0000 [drp] | Freq: Every day | OPHTHALMIC | Status: DC
  Administered 2022-03-16 – 2022-03-18 (×4): 1 [drp] via OPHTHALMIC
  Filled 2022-03-15: qty 2.5

## 2022-03-15 MED ORDER — EPINEPHRINE 0.3 MG/0.3ML IJ SOAJ: 0.3000 mg | INTRAMUSCULAR | Status: DC | PRN

## 2022-03-15 MED ORDER — BENZONATATE 100 MG PO CAPS: 100.0000 mg | ORAL_CAPSULE | Freq: Three times a day (TID) | ORAL | Status: DC | PRN

## 2022-03-15 MED ORDER — DONEPEZIL HCL 5 MG PO TABS
10.0000 mg | ORAL_TABLET | Freq: Every day | ORAL | Status: DC
  Administered 2022-03-16 – 2022-03-19 (×4): 10 mg via ORAL
  Filled 2022-03-15 (×4): qty 2

## 2022-03-15 MED ORDER — SODIUM CHLORIDE 0.9 % IV SOLN: INTRAVENOUS | Status: DC

## 2022-03-15 MED ORDER — ACETAMINOPHEN 650 MG RE SUPP: 650.0000 mg | Freq: Four times a day (QID) | RECTAL | Status: DC | PRN

## 2022-03-15 MED ORDER — CLOPIDOGREL BISULFATE 75 MG PO TABS
75.0000 mg | ORAL_TABLET | Freq: Every day | ORAL | Status: DC
  Administered 2022-03-16: 75 mg via ORAL
  Filled 2022-03-15: qty 1

## 2022-03-15 MED ORDER — STROKE: EARLY STAGES OF RECOVERY BOOK: Freq: Once | Status: AC

## 2022-03-15 MED ORDER — SERTRALINE HCL 50 MG PO TABS
50.0000 mg | ORAL_TABLET | Freq: Every day | ORAL | Status: DC
  Administered 2022-03-16 – 2022-03-19 (×4): 50 mg via ORAL
  Filled 2022-03-15 (×4): qty 1

## 2022-03-15 MED ORDER — AMLODIPINE BESYLATE 5 MG PO TABS
5.0000 mg | ORAL_TABLET | Freq: Every day | ORAL | Status: DC
  Administered 2022-03-16 – 2022-03-19 (×4): 5 mg via ORAL
  Filled 2022-03-15 (×4): qty 1

## 2022-03-15 MED ORDER — ASPIRIN 81 MG PO TBEC
81.0000 mg | DELAYED_RELEASE_TABLET | Freq: Every day | ORAL | Status: DC
  Administered 2022-03-16 – 2022-03-19 (×4): 81 mg via ORAL
  Filled 2022-03-15 (×4): qty 1

## 2022-03-15 MED ORDER — PROSIGHT PO TABS
1.0000 | ORAL_TABLET | Freq: Every day | ORAL | Status: DC
  Administered 2022-03-16 – 2022-03-19 (×4): 1 via ORAL
  Filled 2022-03-15 (×4): qty 1

## 2022-03-15 MED ORDER — ENOXAPARIN SODIUM 40 MG/0.4ML IJ SOSY
40.0000 mg | PREFILLED_SYRINGE | INTRAMUSCULAR | Status: DC
  Administered 2022-03-16 – 2022-03-19 (×4): 40 mg via SUBCUTANEOUS
  Filled 2022-03-15 (×4): qty 0.4

## 2022-03-15 MED ORDER — PINDOLOL 5 MG PO TABS
10.0000 mg | ORAL_TABLET | Freq: Every day | ORAL | Status: DC
  Administered 2022-03-16 – 2022-03-18 (×3): 10 mg via ORAL
  Filled 2022-03-15 (×4): qty 2

## 2022-03-15 MED ORDER — ONDANSETRON HCL 4 MG PO TABS: 4.0000 mg | ORAL_TABLET | Freq: Four times a day (QID) | ORAL | Status: DC | PRN

## 2022-03-15 MED ORDER — PANTOPRAZOLE SODIUM 40 MG PO TBEC
40.0000 mg | DELAYED_RELEASE_TABLET | Freq: Every day | ORAL | Status: DC
  Administered 2022-03-16 – 2022-03-19 (×4): 40 mg via ORAL
  Filled 2022-03-15 (×4): qty 1

## 2022-03-15 MED ORDER — ONDANSETRON HCL 4 MG/2ML IJ SOLN: 4.0000 mg | Freq: Four times a day (QID) | INTRAMUSCULAR | Status: DC | PRN

## 2022-03-15 NOTE — ED Notes (Signed)
ED TO INPATIENT HANDOFF REPORT  ED Nurse Name and Phone #: please call ed  S Name/Age/Gender Todd Golden. 89 y.o. male Room/Bed: ED12A/ED12A  Code Status   Code Status: Prior  Home/SNF/Other Home Patient oriented to: self, place, situation Is this baseline? Yes   Triage Complete: Triage complete  Chief Complaint Acute CVA (cerebrovascular accident) [facility]) [I63.9]  Triage Note Pt to ED via [place] from home. Pt assisted out of car by staff. Family reports pt fell twice last night and twice this morning. Wife states he hit his head on the wall last night. Wife states this morning after fall noticed balance changes and dragging his right leg. Wife reports symptoms started today around 8-9am.   Pt has baseline aphasia from prior stroke. Pt is not on blood thinners.    Allergies No Known Allergies  Level of Care/Admitting Diagnosis ED Disposition     ED Disposition  Admit   Condition  --   [facility]: [place] [[id number]]  Level of Care: Telemetry Medical [104]  Covid Evaluation: Asymptomatic - no recent exposure (last 10 days) testing not required  [facility]: [id number]  Admitting Physician: Christel Mormon [id number]  Attending Physician: Christel Mormon [id number]          B Medical/Surgery History Past Medical History:  Diagnosis Date   Allergy    Arthritis    Asthma    Cataract    Chronic constipation    Colon polyps    Cough    GERD (gastroesophageal reflux disease)    Glaucoma    HOH (hard of hearing)    a. Uses hearing aids   Hyperlipidemia    Hypertension    Macular degeneration    Nephrolithiasis    stent   PAF (paroxysmal atrial fibrillation) ([place])    a. 11/2013 Dx in setting of L MCA stroke. CHA2DS2VASc = 5-->Eliquis; b. 11/2013 Echo: Nl LV fxn. Mildly dil LA.   Stroke [facility])    a. 11/2013 L MCA stroke.   Ureteral stricture    Past Surgical History:  Procedure  Laterality Date   CATARACT EXTRACTION W/PHACO Left 12/20/2014   Procedure: CATARACT EXTRACTION PHACO AND INTRAOCULAR LENS PLACEMENT (IOC);  Surgeon: Birder Robson, MD;  Location: [facility] ORS;  Service: Ophthalmology;  Laterality: Left;   [place]     00:59.6 AP%    21.2 CDE    12.66 fluid pack lot [id number]   JOINT REPLACEMENT Left 2014   knee partial replacement   kidney stent Right 1995     A IV Location/Drains/Wounds Patient Lines/Drains/Airways Status     Active Line/Drains/Airways     Name Placement date Placement time Site Days   Peripheral IV 03/15/22 20 G Right Hand 03/15/22  1610  Hand  less than 1   Incision (Closed) 12/20/14 Eye Left 12/20/14  0929  -- 2642            Intake/Output Last 24 hours No intake or output data in the 24 hours ending 03/15/22 2237  Labs/Imaging Results for orders placed or performed during the hospital encounter of 03/15/22 (from the past 48 hour(s))  Troponin I (High Sensitivity)     Status: None   Collection Time: 03/15/22  4:09 PM  Result Value Ref Range   Troponin I (High Sensitivity) 12 <18 ng/L    Comment: (NOTE) Elevated high sensitivity troponin I (hsTnI) values and significant  changes across serial measurements may suggest ACS  but many other  chronic and acute conditions are known to elevate hsTnI results.  Refer to the "Links" section for chest pain algorithms and additional  guidance. Performed at [facility], [address] [place], [place] [address]   Protime-INR     Status: None   Collection Time: 03/15/22  4:10 PM  Result Value Ref Range   Prothrombin Time 13.8 11.4 - 15.2 seconds   INR 1.1 0.8 - 1.2    Comment: (NOTE) INR goal varies based on device and disease states. Performed at [facility], [address] [place], [place] [address]   APTT     Status: None   Collection Time: 03/15/22  4:10 PM  Result Value Ref Range   aPTT 26 24 - 36 seconds    Comment: Performed at [facility], [address] [place], [place] [address]  CBC     Status: None   Collection Time: 03/15/22  4:10 PM  Result Value Ref Range   WBC 7.6 4.0 - 10.5 K/uL   RBC 4.57 4.22 - 5.81 MIL/uL   Hemoglobin 13.9 13.0 - 17.0 g/dL   HCT 41.9 39.0 - 52.0 %   MCV 91.7 80.0 - 100.0 fL   MCH 30.4 26.0 - 34.0 pg   MCHC 33.2 30.0 - 36.0 g/dL   RDW 13.4 11.5 - 15.5 %   Platelets 244 150 - 400 K/uL   nRBC 0.0 0.0 - 0.2 %    Comment: Performed at [facility], [address] [place], [place] [address]  Differential     Status: Abnormal   Collection Time: 03/15/22  4:10 PM  Result Value Ref Range   Neutrophils Relative % 57 %   Neutro Abs 4.3 1.7 - 7.7 K/uL   Lymphocytes Relative 26 %   Lymphs Abs 2.0 0.7 - 4.0 K/uL   Monocytes Relative 14 %   Monocytes Absolute 1.1 (H) 0.1 - 1.0 K/uL   Eosinophils Relative 3 %   Eosinophils Absolute 0.2 0.0 - 0.5 K/uL   Basophils Relative 0 %   Basophils Absolute 0.0 0.0 - 0.1 K/uL   Immature Granulocytes 0 %   Abs Immature Granulocytes 0.02 0.00 - 0.07 K/uL    Comment: Performed at [facility], [address] [place],  [address]  Comprehensive metabolic panel     Status: Abnormal   Collection Time: 03/15/22  4:10 PM  Result Value Ref Range   Sodium 136 135 - 145 mmol/L   Potassium 3.9 3.5 - 5.1 mmol/L   Chloride 99 98 - 111 mmol/L   CO2 26 22 - 32 mmol/L   Glucose, Bld 101 (H) 70 - 99 mg/dL    Comment: Glucose reference range applies only to samples taken after fasting for at least 8 hours.   BUN 20 8 - 23 mg/dL   Creatinine, Ser 1.22 0.61 - 1.24 mg/dL   Calcium 9.3 8.9 - 10.3 mg/dL   Total Protein 7.1 6.5 - 8.1 g/dL   Albumin 4.0 3.5 - 5.0 g/dL   AST 31 15 - 41 U/L   ALT 25 0 - 44 U/L   Alkaline Phosphatase 47 38 - 126 U/L   Total Bilirubin 0.8 0.3 - 1.2 mg/dL   GFR, Estimated 57 (L) >60 mL/min    Comment: (NOTE) Calculated using the CKD-EPI Creatinine Equation (2021)    Anion gap 11 5 - 15    Comment: Performed at  [facility], [address] [place], [place] [address]   MR BRAIN WO CONTRAST  Result Date: 03/15/2022 CLINICAL DATA:  Multiple falls EXAM: MRI HEAD WITHOUT CONTRAST MRA HEAD WITHOUT CONTRAST TECHNIQUE: Multiplanar, multi-echo pulse sequences of the brain and surrounding structures were acquired without intravenous contrast. Angiographic images of the Circle of Willis were acquired using MRA technique without intravenous contrast. COMPARISON:  03/18/2020 FINDINGS: MRI HEAD FINDINGS Brain: Small acute infarct of the superior left frontal lobe. No acute hemorrhage. No chronic microhemorrhage or siderosis. There is confluent hyperintense T2-weighted signal within the white matter. There is advanced atrophy. Old left frontal infarct. The midline structures are normal. Vascular: Normal flow voids. Skull and upper cervical spine: Normal marrow signal. Sinuses/Orbits: No acute or significant finding. Other: None. MRA HEAD FINDINGS POSTERIOR CIRCULATION: --Vertebral arteries: Right vertebral artery terminates in PICA. Normal left. --Inferior cerebellar arteries: Normal. --Basilar artery: Normal. --Superior cerebellar arteries: Normal. --Posterior cerebral arteries: Normal. ANTERIOR CIRCULATION: --Intracranial internal carotid arteries: Normal. --Anterior cerebral arteries (ACA): Normal. --Middle cerebral arteries (MCA): Normal. IMPRESSION: 1. Small acute infarct of the superior left frontal lobe. No acute hemorrhage or mass effect. 2. Advanced atrophy and chronic small vessel disease. 3. No emergent large vessel occlusion or high-grade stenosis. Electronically Signed   By: Ulyses Jarred M.D.   On: 03/15/2022 21:04   MR ANGIO HEAD WO CONTRAST  Result Date: 03/15/2022 CLINICAL DATA:  Multiple falls EXAM: MRI HEAD WITHOUT CONTRAST MRA HEAD WITHOUT CONTRAST TECHNIQUE: Multiplanar, multi-echo pulse sequences of the brain and surrounding structures were acquired without intravenous contrast. Angiographic  images of the Circle of Willis were acquired using MRA technique without intravenous contrast. COMPARISON:  03/18/2020 FINDINGS: MRI HEAD FINDINGS Brain: Small acute infarct of the superior left frontal lobe. No acute hemorrhage. No chronic microhemorrhage or siderosis. There is confluent hyperintense T2-weighted signal within the white matter. There is advanced atrophy. Old left frontal infarct. The midline structures are normal. Vascular: Normal flow voids. Skull and upper cervical spine: Normal marrow signal. Sinuses/Orbits: No acute or significant finding. Other: None. MRA HEAD FINDINGS POSTERIOR CIRCULATION: --Vertebral arteries: Right vertebral artery terminates in PICA. Normal left. --Inferior cerebellar arteries: Normal. --Basilar artery: Normal. --Superior cerebellar arteries: Normal. --Posterior cerebral arteries: Normal. ANTERIOR CIRCULATION: --Intracranial internal carotid arteries: Normal. --Anterior cerebral arteries (ACA): Normal. --Middle cerebral arteries (MCA): Normal. IMPRESSION: 1. Small acute infarct of the superior left frontal lobe. No acute hemorrhage or mass effect. 2. Advanced atrophy and chronic small vessel disease. 3. No emergent large vessel occlusion or high-grade stenosis. Electronically Signed   By: Ulyses Jarred M.D.   On: 03/15/2022 21:04   CT HEAD WO CONTRAST  Result Date: 03/15/2022 CLINICAL DATA:  Fall EXAM: CT HEAD WITHOUT CONTRAST CT CERVICAL SPINE WITHOUT CONTRAST TECHNIQUE: Multidetector CT imaging of the head and cervical spine was performed following the standard protocol without intravenous contrast. Multiplanar CT image reconstructions of the cervical spine were also generated. RADIATION DOSE REDUCTION: This exam was performed according to the departmental dose-optimization program which includes automated exposure control, adjustment of the mA and/or kV according to patient size and/or use of iterative reconstruction technique. COMPARISON:  CT Head 07/12/21 FINDINGS:  CT HEAD FINDINGS Brain: Redemonstrated extensive periventricular white matter hypodensity with likely chronic infarcts in the centrum semiovale on the left and the posterior left frontal lobe. No hemorrhage. No extra-axial fluid collection. Unchanged size and shape of the ventricular system. Vascular: No hyperdense vessel or unexpected calcification. Skull: Normal. Negative for fracture or focal lesion. Sinuses/Orbits: Mucosal thickening bilateral maxillary sinuses. Bilateral  lens replacement. No middle ear or mastoid effusion. Other: None. CT CERVICAL SPINE FINDINGS Alignment: Normal. Skull base and vertebrae: No acute fracture. No primary bone lesion or focal pathologic process. Soft tissues and spinal canal: No prevertebral fluid or swelling. No visible canal hematoma. Disc levels:  No evidence of high-grade spinal canal stenosis. Upper chest: Negative. Other: None IMPRESSION: 1. No acute intracranial abnormality. 2. No acute fracture or traumatic listhesis in the cervical spine. Electronically Signed   By: Marin Roberts M.D.   On: 03/15/2022 17:35   CT CERVICAL SPINE WO CONTRAST  Result Date: 03/15/2022 CLINICAL DATA:  Fall EXAM: CT HEAD WITHOUT CONTRAST CT CERVICAL SPINE WITHOUT CONTRAST TECHNIQUE: Multidetector CT imaging of the head and cervical spine was performed following the standard protocol without intravenous contrast. Multiplanar CT image reconstructions of the cervical spine were also generated. RADIATION DOSE REDUCTION: This exam was performed according to the departmental dose-optimization program which includes automated exposure control, adjustment of the mA and/or kV according to patient size and/or use of iterative reconstruction technique. COMPARISON:  CT Head 07/12/21 FINDINGS: CT HEAD FINDINGS Brain: Redemonstrated extensive periventricular white matter hypodensity with likely chronic infarcts in the centrum semiovale on the left and the posterior left frontal lobe. No hemorrhage. No  extra-axial fluid collection. Unchanged size and shape of the ventricular system. Vascular: No hyperdense vessel or unexpected calcification. Skull: Normal. Negative for fracture or focal lesion. Sinuses/Orbits: Mucosal thickening bilateral maxillary sinuses. Bilateral lens replacement. No middle ear or mastoid effusion. Other: None. CT CERVICAL SPINE FINDINGS Alignment: Normal. Skull base and vertebrae: No acute fracture. No primary bone lesion or focal pathologic process. Soft tissues and spinal canal: No prevertebral fluid or swelling. No visible canal hematoma. Disc levels:  No evidence of high-grade spinal canal stenosis. Upper chest: Negative. Other: None IMPRESSION: 1. No acute intracranial abnormality. 2. No acute fracture or traumatic listhesis in the cervical spine. Electronically Signed   By: Marin Roberts M.D.   On: 03/15/2022 17:35    Pending Labs Unresulted Labs (From admission, onward)    None       Vitals/Pain Today's Vitals   03/15/22 1601 03/15/22 1700 03/15/22 2005 03/15/22 2215  BP: (!) 142/90 130/85 (!) 138/96   Pulse: 85 92 85   Resp: '18 18 19 18  '$ Temp: 98.1 F (36.7 C)  97.9 F (36.6 C)   TempSrc: Oral  Oral   SpO2: 100% 100% 97%   PainSc:    0-No pain    Isolation Precautions No active isolations  Medications Medications - No data to display  Mobility walks with person assist and device     Focused Assessments     R Recommendations: See Admitting Provider Note  Report given to:   Additional Notes: please call with any questions thank you

## 2022-03-15 NOTE — ED Notes (Signed)
Family rang call light, stated that the pt had fallen. RN to bedside. Pt laying on L side of body on the floor near the toilet. RN inquired why the family member let him out of the bed since we have been using a walker and 1x person assist. Family stated that he let himself out of the bed. RN asked who let the bed rail down since both were up for safety. Family stated the pt let the railing down and then jumped out of the bed. RN inquired if he had hit his head. Family stated that he was able to guide him down to the floor and protect his head. When RN asked the pt if he is hurting anywhere, pt states no.

## 2022-03-15 NOTE — ED Triage Notes (Signed)
Pt to ED via [facility] from home. Pt assisted out of car by staff. Family reports pt fell twice last night and twice this morning. Wife states he hit his head on the wall last night. Wife states this morning after fall noticed balance changes and dragging his right leg. Wife reports symptoms started today around 8-9am.   Pt has baseline aphasia from prior stroke. Pt is not on blood thinners.

## 2022-03-15 NOTE — H&P (Addendum)
[place]   PATIENT NAME: Todd Golden    MR#:  [id number]  DATE OF BIRTH:  01/20/1931  DATE OF ADMISSION:  03/15/2022  PRIMARY CARE PHYSICIAN: Crecencio Mc, MD   Patient is coming from: Home  REQUESTING/REFERRING PHYSICIAN: Nance Pear, MD  CHIEF COMPLAINT:   Chief Complaint  Patient presents with   Fall   Weakness    HISTORY OF PRESENT ILLNESS:  Todd Golden. is a 89 y.o. Caucasian male with medical history significant for GERD, hypertension, dyslipidemia, nephrolithiasis and paroxysmal atrial fibrillation as well as CVA, who presented to the ER with acute onset of recurrent falls with 2 falls yesterday and 2 falls during the day as well as 1 in the ER.  His family noted that he was dragging his right lower extremity.  The patient denies any headache or dizziness or blurred vision.  He denies any paresthesias or right upper extremity weakness.  No fever or chills.  No nausea or vomiting or abdominal pain.  He denies any urinary or stool incontinence.  He has expressive aphasia from previous stroke and believes that it is slightly worse.  No chest pain or palpitations.  No cough or wheezing or hemoptysis.  No other bleeding diathesis.  ED Course: When the patient came to the ER, BP was 142/90, respiratory rate 24 with otherwise normal vital signs.  Labs revealed unremarkable CT PE and CBC. EKG as reviewed by me : Atrial fibrillation with controlled ventricular response of 89, LAD with left anterior fascicular block, poor R wave progression and borderline prolonged QT interval. Imaging: Noncontrast head CT scan revealed no acute intracranial normalities and C-spine CT showed no acute fracture or traumatic listhesis. Brain MRI without contrast showed small acute infarct in the superior part of the left frontal lobe with no acute hemorrhage or mass effect.  It showed advanced atrophy and chronic small vessel disease.  MRA showed no emergent large vessel occlusion  or high-grade stenosis.  The patient will be admitted to a medical telemetry observation bed for further evaluation and management PAST MEDICAL HISTORY:   Past Medical History:  Diagnosis Date   Allergy    Arthritis    Asthma    Cataract    Chronic constipation    Colon polyps    Cough    GERD (gastroesophageal reflux disease)    Glaucoma    HOH (hard of hearing)    a. Uses hearing aids   Hyperlipidemia    Hypertension    Macular degeneration    Nephrolithiasis    stent   PAF (paroxysmal atrial fibrillation) ([place])    a. 11/2013 Dx in setting of L MCA stroke. CHA2DS2VASc = 5-->Eliquis; b. 11/2013 Echo: Nl LV fxn. Mildly dil LA.   Stroke [facility])    a. 11/2013 L MCA stroke.   Ureteral stricture     PAST SURGICAL HISTORY:   Past Surgical History:  Procedure Laterality Date   CATARACT EXTRACTION W/PHACO Left 12/20/2014   Procedure: CATARACT EXTRACTION PHACO AND INTRAOCULAR LENS PLACEMENT ([place]);  Surgeon: Birder Robson, MD;  Location: [facility] ORS;  Service: Ophthalmology;  Laterality: Left;   [place]     00:59.6 AP%    21.2 CDE    12.66 fluid pack lot [id number]   JOINT REPLACEMENT Left 2014   knee partial replacement   kidney stent Right 1995    SOCIAL HISTORY:   Social History   Tobacco Use   Smoking status: Never  Passive exposure: Never   Smokeless tobacco: Never  Substance Use Topics   Alcohol use: Yes    Comment: rarely    FAMILY HISTORY:   Family History  Problem Relation Age of Onset   Arthritis Mother    Cancer Mother 37       kidney   Stroke Father     DRUG ALLERGIES:  No Known Allergies  REVIEW OF SYSTEMS:   ROS As per history of present illness. All pertinent systems were reviewed above. Constitutional, HEENT, cardiovascular, respiratory, GI, GU, musculoskeletal, neuro, psychiatric, endocrine, integumentary and hematologic systems were reviewed and are otherwise negative/unremarkable except for positive findings mentioned above in the  HPI.   MEDICATIONS AT HOME:   Prior to Admission medications   Medication Sig Start Date End Date Taking? Authorizing Provider  amLODipine (NORVASC) 5 MG tablet TAKE 1 TABLET(5 MG) BY MOUTH DAILY 02/05/22   Crecencio Mc, MD  aspirin EC 81 MG tablet Take 81 mg by mouth daily. Swallow whole.    [provider]  benzonatate (TESSALON) 100 MG capsule Take 1 capsule (100 mg total) by mouth 3 (three) times daily as needed. 01/14/22   Mar Daring, PA-C  donepezil (ARICEPT) 10 MG tablet Take 10 mg by mouth daily. 10/02/20   [provider]  doxazosin (CARDURA) 4 MG tablet Take 1 tablet (4 mg total) by mouth daily. 04/03/20   Wellington Hampshire, MD  EPINEPHrine 0.3 mg/0.3 mL IJ SOAJ injection Inject 0.3 mg into the muscle as needed for anaphylaxis.    [provider]  hydrochlorothiazide (HYDRODIURIL) 25 MG tablet Take 1 tablet (25 mg total) by mouth daily. 09/10/21   Crecencio Mc, MD  latanoprost (XALATAN) 0.005 % ophthalmic solution Place 1 drop into both eyes at bedtime. 10/03/12   [provider]  Multiple Vitamins-Minerals (PRESERVISION AREDS 2 PO) Take by mouth daily.    [provider]  omeprazole (PRILOSEC) 40 MG capsule TAKE 1 CAPSULE(40 MG) BY MOUTH DAILY 02/13/22   Crecencio Mc, MD  pindolol (VISKEN) 10 MG tablet TAKE 1 TABLET(10 MG) BY MOUTH DAILY 12/04/21   Crecencio Mc, MD  sertraline (ZOLOFT) 50 MG tablet Take 50 mg by mouth daily. 01/29/22   [provider]      VITAL SIGNS:  Blood pressure 138/86, pulse (!) 107, temperature 98.1 F (36.7 C), resp. rate 16, height '5\' 11"'$  (1.803 m), weight 79.3 kg, SpO2 96 %.  PHYSICAL EXAMINATION:  Physical Exam  GENERAL:  89 y.o.-year-old Caucasian male patient lying in the bed with no acute distress.  EYES: Pupils equal, round, reactive to light and accommodation. No scleral icterus. Extraocular muscles intact.  HEENT: Head atraumatic, normocephalic. Oropharynx and nasopharynx  clear.  NECK:  Supple, no jugular venous distention. No thyroid enlargement, no tenderness.  LUNGS: Normal breath sounds bilaterally, no wheezing, rales,rhonchi or crepitation. No use of accessory muscles of respiration.  CARDIOVASCULAR: Regular rate and rhythm, S1, S2 normal. No murmurs, rubs, or gallops.  ABDOMEN: Soft, nondistended, nontender. Bowel sounds present. No organomegaly or mass.  EXTREMITIES: No pedal edema, cyanosis, or clubbing.  NEUROLOGIC: Cranial nerves II through XII are intact except for expressive dysphasia and mild slurred speech. Muscle strength 5/5 in all extremities except for the right lower extremity 3-4/5. Sensation intact. Gait not checked.  PSYCHIATRIC: The patient is alert and oriented x 3.  Normal affect and good eye contact. SKIN: No obvious rash, lesion, or ulcer.   LABORATORY PANEL:   CBC  Recent Labs  Lab 03/15/22 1610  WBC 7.6  HGB 13.9  HCT 41.9  PLT 244   ------------------------------------------------------------------------------------------------------------------  Chemistries  Recent Labs  Lab 03/15/22 1610  NA 136  K 3.9  CL 99  CO2 26  GLUCOSE 101*  BUN 20  CREATININE 1.22  CALCIUM 9.3  AST 31  ALT 25  ALKPHOS 47  BILITOT 0.8   ------------------------------------------------------------------------------------------------------------------  Cardiac Enzymes No results for input(s): "TROPONINI" in the last 168 hours. ------------------------------------------------------------------------------------------------------------------  RADIOLOGY:  MR BRAIN WO CONTRAST  Result Date: 03/15/2022 CLINICAL DATA:  Multiple falls EXAM: MRI HEAD WITHOUT CONTRAST MRA HEAD WITHOUT CONTRAST TECHNIQUE: Multiplanar, multi-echo pulse sequences of the brain and surrounding structures were acquired without intravenous contrast. Angiographic images of the Circle of Willis were acquired using MRA technique without intravenous contrast.  COMPARISON:  03/18/2020 FINDINGS: MRI HEAD FINDINGS Brain: Small acute infarct of the superior left frontal lobe. No acute hemorrhage. No chronic microhemorrhage or siderosis. There is confluent hyperintense T2-weighted signal within the white matter. There is advanced atrophy. Old left frontal infarct. The midline structures are normal. Vascular: Normal flow voids. Skull and upper cervical spine: Normal marrow signal. Sinuses/Orbits: No acute or significant finding. Other: None. MRA HEAD FINDINGS POSTERIOR CIRCULATION: --Vertebral arteries: Right vertebral artery terminates in PICA. Normal left. --Inferior cerebellar arteries: Normal. --Basilar artery: Normal. --Superior cerebellar arteries: Normal. --Posterior cerebral arteries: Normal. ANTERIOR CIRCULATION: --Intracranial internal carotid arteries: Normal. --Anterior cerebral arteries (ACA): Normal. --Middle cerebral arteries (MCA): Normal. IMPRESSION: 1. Small acute infarct of the superior left frontal lobe. No acute hemorrhage or mass effect. 2. Advanced atrophy and chronic small vessel disease. 3. No emergent large vessel occlusion or high-grade stenosis. Electronically Signed   By: Ulyses Jarred M.D.   On: 03/15/2022 21:04   MR ANGIO HEAD WO CONTRAST  Result Date: 03/15/2022 CLINICAL DATA:  Multiple falls EXAM: MRI HEAD WITHOUT CONTRAST MRA HEAD WITHOUT CONTRAST TECHNIQUE: Multiplanar, multi-echo pulse sequences of the brain and surrounding structures were acquired without intravenous contrast. Angiographic images of the Circle of Willis were acquired using MRA technique without intravenous contrast. COMPARISON:  03/18/2020 FINDINGS: MRI HEAD FINDINGS Brain: Small acute infarct of the superior left frontal lobe. No acute hemorrhage. No chronic microhemorrhage or siderosis. There is confluent hyperintense T2-weighted signal within the white matter. There is advanced atrophy. Old left frontal infarct. The midline structures are normal. Vascular: Normal flow  voids. Skull and upper cervical spine: Normal marrow signal. Sinuses/Orbits: No acute or significant finding. Other: None. MRA HEAD FINDINGS POSTERIOR CIRCULATION: --Vertebral arteries: Right vertebral artery terminates in PICA. Normal left. --Inferior cerebellar arteries: Normal. --Basilar artery: Normal. --Superior cerebellar arteries: Normal. --Posterior cerebral arteries: Normal. ANTERIOR CIRCULATION: --Intracranial internal carotid arteries: Normal. --Anterior cerebral arteries (ACA): Normal. --Middle cerebral arteries (MCA): Normal. IMPRESSION: 1. Small acute infarct of the superior left frontal lobe. No acute hemorrhage or mass effect. 2. Advanced atrophy and chronic small vessel disease. 3. No emergent large vessel occlusion or high-grade stenosis. Electronically Signed   By: Ulyses Jarred M.D.   On: 03/15/2022 21:04   CT HEAD WO CONTRAST  Result Date: 03/15/2022 CLINICAL DATA:  Fall EXAM: CT HEAD WITHOUT CONTRAST CT CERVICAL SPINE WITHOUT CONTRAST TECHNIQUE: Multidetector CT imaging of the head and cervical spine was performed following the standard protocol without intravenous contrast. Multiplanar CT image reconstructions of the cervical spine were also generated. RADIATION DOSE REDUCTION: This exam was performed according to the departmental dose-optimization program which includes automated exposure control, adjustment of the mA and/or  kV according to patient size and/or use of iterative reconstruction technique. COMPARISON:  CT Head 07/12/21 FINDINGS: CT HEAD FINDINGS Brain: Redemonstrated extensive periventricular white matter hypodensity with likely chronic infarcts in the centrum semiovale on the left and the posterior left frontal lobe. No hemorrhage. No extra-axial fluid collection. Unchanged size and shape of the ventricular system. Vascular: No hyperdense vessel or unexpected calcification. Skull: Normal. Negative for fracture or focal lesion. Sinuses/Orbits: Mucosal thickening bilateral  maxillary sinuses. Bilateral lens replacement. No middle ear or mastoid effusion. Other: None. CT CERVICAL SPINE FINDINGS Alignment: Normal. Skull base and vertebrae: No acute fracture. No primary bone lesion or focal pathologic process. Soft tissues and spinal canal: No prevertebral fluid or swelling. No visible canal hematoma. Disc levels:  No evidence of high-grade spinal canal stenosis. Upper chest: Negative. Other: None IMPRESSION: 1. No acute intracranial abnormality. 2. No acute fracture or traumatic listhesis in the cervical spine. Electronically Signed   By: Marin Roberts M.D.   On: 03/15/2022 17:35   CT CERVICAL SPINE WO CONTRAST  Result Date: 03/15/2022 CLINICAL DATA:  Fall EXAM: CT HEAD WITHOUT CONTRAST CT CERVICAL SPINE WITHOUT CONTRAST TECHNIQUE: Multidetector CT imaging of the head and cervical spine was performed following the standard protocol without intravenous contrast. Multiplanar CT image reconstructions of the cervical spine were also generated. RADIATION DOSE REDUCTION: This exam was performed according to the departmental dose-optimization program which includes automated exposure control, adjustment of the mA and/or kV according to patient size and/or use of iterative reconstruction technique. COMPARISON:  CT Head 07/12/21 FINDINGS: CT HEAD FINDINGS Brain: Redemonstrated extensive periventricular white matter hypodensity with likely chronic infarcts in the centrum semiovale on the left and the posterior left frontal lobe. No hemorrhage. No extra-axial fluid collection. Unchanged size and shape of the ventricular system. Vascular: No hyperdense vessel or unexpected calcification. Skull: Normal. Negative for fracture or focal lesion. Sinuses/Orbits: Mucosal thickening bilateral maxillary sinuses. Bilateral lens replacement. No middle ear or mastoid effusion. Other: None. CT CERVICAL SPINE FINDINGS Alignment: Normal. Skull base and vertebrae: No acute fracture. No primary bone lesion or  focal pathologic process. Soft tissues and spinal canal: No prevertebral fluid or swelling. No visible canal hematoma. Disc levels:  No evidence of high-grade spinal canal stenosis. Upper chest: Negative. Other: None IMPRESSION: 1. No acute intracranial abnormality. 2. No acute fracture or traumatic listhesis in the cervical spine. Electronically Signed   By: Marin Roberts M.D.   On: 03/15/2022 17:35      IMPRESSION AND PLAN:  Assessment and Plan: * Acute CVA (cerebrovascular accident) [facility]) - The patient will be admitted to an observation medically monitored bed. - This is manifested by right lower extremity weakness/paresis.  His onset of symptoms with more than 24 hours ago and therefore he was not a candidate for thrombolytic therapy. - We will follow neuro checks q.4 hours for 24 hours.   - The patient will be placed on aspirin and will add Plavix.   - Will obtain a 2D echo with bubble study and CTA of the head and neck.   - A neurology consultation  as well as physical/occupation/speech therapy consults will be obtained in a.m.Marland Kitchen - I notified Dr. Rory Percy about the patient. - The patient will be placed on statin therapy and fasting lipids will be checked.    Essential hypertension, benign - We will continue his antihypertensives.  His onset of symptoms was more than 24 hours ago.  Paroxysmal atrial fibrillation (HCC) - We will continue his beta-blocker  therapy and aspirin. - The patient is not a candidate for anticoagulation at this time due to fall risk.  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[place]) - We will continue his Aricept and Zoloft.  Dyslipidemia - We will continue his statin therapy and check fasting lipids.       DVT prophylaxis: Lovenox.  Advanced Care Planning:  Code Status: He is DNR/DNI.  This was discussed with him and his son.  Family Communication:  The plan of care was discussed in details with the patient (and family). I answered all questions. The  patient agreed to proceed with the above mentioned plan. Further management will depend upon hospital course. Disposition Plan: Back to previous home environment Consults called: Neurology. All the records are reviewed and case discussed with ED provider.  Status is: Observation   I certify that at the time of admission, it is my clinical judgment that the patient will require hospital care extending less than 2 midnights.                            Dispo: The patient is from: Home              Anticipated d/c is to: Home              Patient currently is not medically stable to d/c.              Difficult to place patient: No  Christel Mormon M.D on 03/16/2022 at 4:57 AM  [facility]   From 7 PM-7 AM, contact night-coverage [email or web address]  CC: Primary care physician; Crecencio Mc, MD

## 2022-03-15 NOTE — ED Notes (Signed)
Pt given water.

## 2022-03-15 NOTE — ED Provider Notes (Signed)
[facility] Provider Note    Event Date/Time   First MD Initiated Contact with Patient 03/15/22 1614     (approximate)   History   Fall and Weakness   HPI  Todd Golden. is a 89 y.o. male who presents to the emergency department today because of concerns for multiple falls.  Patient has history of stroke so does have aphasia so most history is obtained by family at bedside.  They state that he had 2 falls yesterday and then 2 falls again today.  He does have some baseline balance issues but will only fall once or twice a month.  No recent fevers or signs of illness.  Family did notice that he was dragging his right leg after one of the falls today.  He also did hit his head against a wall today.  He denies any significant pain.     Physical Exam   Triage Vital Signs: ED Triage Vitals  Enc Vitals Group     BP 03/15/22 1601 (!) 142/90     Pulse Rate 03/15/22 1601 85     Resp 03/15/22 1601 18     Temp 03/15/22 1601 98.1 F (36.7 C)     Temp Source 03/15/22 1601 Oral     SpO2 03/15/22 1601 100 %     Weight --      Height --      Head Circumference --      Peak Flow --      Pain Score 03/15/22 1559 0     Pain Loc --      Pain Edu? --      Excl. in [place]? --     Most recent vital signs: Vitals:   03/15/22 1601  BP: (!) 142/90  Pulse: 85  Resp: 18  Temp: 98.1 F (36.7 C)  SpO2: 100%   General: Awake, alert, aphagia CV:  Good peripheral perfusion. Regular rate and rhythm. Resp:  Normal effort. Lungs clear. Abd:  No distention. Non tender Other:  Aphagia   ED Results / Procedures / Treatments   Labs (all labs ordered are listed, but only abnormal results are displayed) Labs Reviewed  DIFFERENTIAL - Abnormal; Notable for the following components:      Result Value   Monocytes Absolute 1.1 (*)    All other components within normal limits  COMPREHENSIVE METABOLIC PANEL - Abnormal; Notable for the following components:   Glucose,  Bld 101 (*)    GFR, Estimated 57 (*)    All other components within normal limits  PROTIME-INR  APTT  CBC  TROPONIN I (HIGH SENSITIVITY)     EKG  I, Nance Pear, attending physician, personally viewed and interpreted this EKG  EKG Time: 1600 Rate: 89 Rhythm: atrial fibrillation Axis: left axis deviation Intervals: qtc 488 QRS: LAFB ST changes: no st elevation Impression: abnormal ekg   RADIOLOGY I independently interpreted and visualized the CT head/cervical spine. My interpretation: No acute bleed Radiology interpretation:  IMPRESSION:  1. No acute intracranial abnormality.  2. No acute fracture or traumatic listhesis in the cervical spine.    MR Brain IMPRESSION:  1. Small acute infarct of the superior left frontal lobe. No acute  hemorrhage or mass effect.  2. Advanced atrophy and chronic small vessel disease.  3. No emergent large vessel occlusion or high-grade stenosis.    PROCEDURES:  Critical Care performed: No  Procedures   MEDICATIONS ORDERED IN ED: Medications - No data to display  IMPRESSION / MDM / ASSESSMENT AND PLAN / ED COURSE  I reviewed the triage vital signs and the nursing notes.                              Differential diagnosis includes, but is not limited to, CVA, TIA, anemia, electrolyte abnormality  Patient's presentation is most consistent with acute presentation with potential threat to life or bodily function.  The patient is on the cardiac monitor to evaluate for evidence of arrhythmia and/or significant heart rate changes.   Patient presented to the emergency department today because of concerns for increased falls and family noticing patient dragging his right foot.  Does seem like last normal was likely yesterday given that that is when the increased falls started.  Thus patient would be outside of TNK window.  CT head and cervical spine did not show any concerning findings.  Patient with history of stroke so MRI was  obtained.  This did show an acute stroke.  Discussed with Dr. Sidney Ace with hospitalist service who will plan on admission.  FINAL CLINICAL IMPRESSION(S) / ED DIAGNOSES   Final diagnoses:  Cerebrovascular accident (CVA), unspecified mechanism ([place])    Note:  This document was prepared using Dragon voice recognition software and may include unintentional dictation errors.    Nance Pear, MD 03/15/22 [phone]

## 2022-03-16 ENCOUNTER — Observation Stay: Payer: PPO

## 2022-03-16 ENCOUNTER — Observation Stay (HOSPITAL_COMMUNITY)
Admit: 2022-03-16 | Discharge: 2022-03-16 | Disposition: A | Discharge status: Home / Self Care | Payer: PPO | Attending: Family Medicine | Admitting: Family Medicine

## 2022-03-16 ENCOUNTER — Encounter: Payer: Self-pay | Admitting: Family Medicine

## 2022-03-16 DIAGNOSIS — Z79899 Other long term (current) drug therapy: Secondary | ICD-10-CM | POA: Diagnosis not present

## 2022-03-16 DIAGNOSIS — W2201XA Walked into wall, initial encounter: Secondary | ICD-10-CM | POA: Diagnosis present

## 2022-03-16 DIAGNOSIS — I639 Cerebral infarction, unspecified: Secondary | ICD-10-CM | POA: Diagnosis present

## 2022-03-16 DIAGNOSIS — Z823 Family history of stroke: Secondary | ICD-10-CM | POA: Diagnosis not present

## 2022-03-16 DIAGNOSIS — I69320 Aphasia following cerebral infarction: Secondary | ICD-10-CM | POA: Diagnosis not present

## 2022-03-16 DIAGNOSIS — I6389 Other cerebral infarction: Secondary | ICD-10-CM

## 2022-03-16 DIAGNOSIS — K219 Gastro-esophageal reflux disease without esophagitis: Secondary | ICD-10-CM | POA: Diagnosis present

## 2022-03-16 DIAGNOSIS — R296 Repeated falls: Secondary | ICD-10-CM | POA: Diagnosis present

## 2022-03-16 DIAGNOSIS — R130 Aphagia: Secondary | ICD-10-CM | POA: Diagnosis present

## 2022-03-16 DIAGNOSIS — I444 Left anterior fascicular block: Secondary | ICD-10-CM | POA: Diagnosis present

## 2022-03-16 DIAGNOSIS — Z9842 Cataract extraction status, left eye: Secondary | ICD-10-CM | POA: Diagnosis not present

## 2022-03-16 DIAGNOSIS — R29898 Other symptoms and signs involving the musculoskeletal system: Secondary | ICD-10-CM

## 2022-03-16 DIAGNOSIS — Z96652 Presence of left artificial knee joint: Secondary | ICD-10-CM | POA: Diagnosis present

## 2022-03-16 DIAGNOSIS — E785 Hyperlipidemia, unspecified: Secondary | ICD-10-CM | POA: Diagnosis present

## 2022-03-16 DIAGNOSIS — Z66 Do not resuscitate: Secondary | ICD-10-CM | POA: Diagnosis present

## 2022-03-16 DIAGNOSIS — F32A Depression, unspecified: Secondary | ICD-10-CM | POA: Diagnosis present

## 2022-03-16 DIAGNOSIS — Z86010 Personal history of colonic polyps: Secondary | ICD-10-CM | POA: Diagnosis not present

## 2022-03-16 DIAGNOSIS — Z87442 Personal history of urinary calculi: Secondary | ICD-10-CM | POA: Diagnosis not present

## 2022-03-16 DIAGNOSIS — H3530 Unspecified macular degeneration: Secondary | ICD-10-CM | POA: Diagnosis present

## 2022-03-16 DIAGNOSIS — H409 Unspecified glaucoma: Secondary | ICD-10-CM | POA: Diagnosis present

## 2022-03-16 DIAGNOSIS — I5022 Chronic systolic (congestive) heart failure: Secondary | ICD-10-CM | POA: Diagnosis present

## 2022-03-16 DIAGNOSIS — I63231 Cerebral infarction due to unspecified occlusion or stenosis of right carotid arteries: Secondary | ICD-10-CM | POA: Diagnosis not present

## 2022-03-16 DIAGNOSIS — F0153 Vascular dementia, unspecified severity, with mood disturbance: Secondary | ICD-10-CM | POA: Diagnosis present

## 2022-03-16 DIAGNOSIS — I69341 Monoplegia of lower limb following cerebral infarction affecting right dominant side: Secondary | ICD-10-CM | POA: Diagnosis not present

## 2022-03-16 DIAGNOSIS — R29704 NIHSS score 4: Secondary | ICD-10-CM | POA: Diagnosis present

## 2022-03-16 DIAGNOSIS — I63512 Cerebral infarction due to unspecified occlusion or stenosis of left middle cerebral artery: Secondary | ICD-10-CM | POA: Diagnosis not present

## 2022-03-16 DIAGNOSIS — M1990 Unspecified osteoarthritis, unspecified site: Secondary | ICD-10-CM | POA: Diagnosis present

## 2022-03-16 DIAGNOSIS — I110 Hypertensive heart disease with heart failure: Secondary | ICD-10-CM | POA: Diagnosis present

## 2022-03-16 DIAGNOSIS — I480 Paroxysmal atrial fibrillation: Secondary | ICD-10-CM | POA: Diagnosis present

## 2022-03-16 LAB — BASIC METABOLIC PANEL
Anion gap: 10 (ref 5–15)
BUN: 18 mg/dL (ref 8–23)
CO2: 27 mmol/L (ref 22–32)
Calcium: 9.1 mg/dL (ref 8.9–10.3)
Chloride: 101 mmol/L (ref 98–111)
Creatinine, Ser: 1.15 mg/dL (ref 0.61–1.24)
GFR, Estimated: >60 mL/min (ref >60)
Glucose, Bld: 104 mg/dL — ABNORMAL HIGH (ref 70–99)
Potassium: 3.5 mmol/L (ref 3.5–5.1)
Sodium: 138 mmol/L (ref 135–145)

## 2022-03-16 LAB — ECHOCARDIOGRAM COMPLETE BUBBLE STUDY
AR max vel: 2.22 cm2
AV Area VTI: 2.26 cm2
AV Area mean vel: 2.2 cm2
AV Mean grad: 2.7 mmHg
AV Peak grad: 5.3 mmHg
Ao pk vel: 1.15 m/s
Area-P 1/2: 4.73 cm2
Calc EF: 44.9 %
S' Lateral: 3.6 cm
Single Plane A2C EF: 38.5 %
Single Plane A4C EF: 48.4 %

## 2022-03-16 LAB — CBC
HCT: 41.6 % (ref 39.0–52.0)
Hemoglobin: 13.7 g/dL (ref 13.0–17.0)
MCH: 30.2 pg (ref 26.0–34.0)
MCHC: 32.9 g/dL (ref 30.0–36.0)
MCV: 91.8 fL (ref 80.0–100.0)
Platelets: 218 10*3/uL (ref 150–400)
RBC: 4.53 MIL/uL (ref 4.22–5.81)
RDW: 13.5 % (ref 11.5–15.5)
WBC: 7.2 10*3/uL (ref 4.0–10.5)
nRBC: 0 % (ref 0.0–0.2)

## 2022-03-16 LAB — LIPID PANEL
Cholesterol: 165 mg/dL (ref 0–200)
HDL: 50 mg/dL (ref >40)
LDL Cholesterol: 93 mg/dL (ref 0–99)
Total CHOL/HDL Ratio: 3.3 RATIO
Triglycerides: 109 mg/dL (ref <150)
VLDL: 22 mg/dL (ref 0–40)

## 2022-03-16 LAB — HEMOGLOBIN A1C
Hgb A1c MFr Bld: 5.6 % (ref 4.8–5.6)
Mean Plasma Glucose: 114.02 mg/dL

## 2022-03-16 MED ORDER — IOHEXOL 350 MG/ML SOLN: 100.0000 mL | Freq: Once | INTRAVENOUS | Status: DC | PRN

## 2022-03-16 MED ORDER — IOHEXOL 350 MG/ML SOLN
75.0000 mL | Freq: Once | INTRAVENOUS | Status: AC | PRN
  Administered 2022-03-16: 75 mL via INTRAVENOUS

## 2022-03-16 NOTE — Evaluation (Signed)
Occupational Therapy Evaluation Patient Details Name: Todd Golden. MRN: [id number] DOB: [birth year] Today's Date: 03/16/2022   History of Present Illness Todd Randolf. is a 89 y.o. male past medical history of a prior left MCA stroke with residual aphasia, paroxysmal atrial fibrillation not on anticoagulation due to falls, hypertension, hyperlipidemia, presenting to the emergency room with complaints of increasing falls 2 days prior to presentation and on the day of presentation.  He does have a history of frequent falls-has had 18 falls last year. MRI of the brain was done that showed a punctate infarct in the superior left frontal lobe.   Clinical Impression   Todd Golden was seen for OT evaluation this date. Prior to hospital admission, pt was MOD I using rollator, endorses fall history. Pt lives with spouse. Pt presents to acute OT demonstrating impaired ADL performance and functional mobility 2/2 decreased activity tolerance and functional strength/ROM/balance deficits. Pt currently requires MIN A don/doff R sock seated EOB, assist for L lateral lean. MIN A + RW toilet t/f and urinating standing at toilet, assist for clothing mgmt. Pt would benefit from skilled OT to address noted impairments and functional limitations (see below for any additional details). Upon hospital discharge, recommend AIR to maximize pt safety and return to PLOF.    Recommendations for follow up therapy are one component of a multi-disciplinary discharge planning process, led by the attending physician.  Recommendations may be updated based on patient status, additional functional criteria and insurance authorization.   Follow Up Recommendations  Acute inpatient rehab (3hours/day)     Assistance Recommended at Discharge Intermittent Supervision/Assistance  Patient can return home with the following A little help with walking and/or transfers;A little help with bathing/dressing/bathroom;Help with stairs or  ramp for entrance    Functional Status Assessment  Patient has had a recent decline in their functional status and demonstrates the ability to make significant improvements in function in a reasonable and predictable amount of time.  Equipment Recommendations  Other (comment) (2WW)    Recommendations for Other Services       Precautions / Restrictions Precautions Precautions: Fall Restrictions Weight Bearing Restrictions: No      Mobility Bed Mobility Overal bed mobility: Needs Assistance Bed Mobility: Supine to Sit, Sit to Supine     Supine to sit: Min assist Sit to supine: Min guard        Transfers Overall transfer level: Needs assistance Equipment used: Rolling walker (2 wheels) Transfers: Sit to/from Stand Sit to Stand: Min assist, From elevated surface                  Balance Overall balance assessment: Needs assistance Sitting-balance support: No upper extremity supported, Feet supported Sitting balance-Leahy Scale: Fair     Standing balance support: No upper extremity supported, During functional activity Standing balance-Leahy Scale: Poor                             ADL either performed or assessed with clinical judgement   ADL Overall ADL's : Needs assistance/impaired                                       General ADL Comments: MIN A don/doff R sock seated EOB, assist for L lateral lean. MIN A + RW toilet t/f and urinating standing at toilet, assist for clothing  mgmt.      Pertinent Vitals/Pain Pain Assessment Pain Assessment: No/denies pain     Hand Dominance Right   Extremity/Trunk Assessment Upper Extremity Assessment Upper Extremity Assessment: Overall WFL for tasks assessed   Lower Extremity Assessment Lower Extremity Assessment: RLE deficits/detail;Generalized weakness RLE Deficits / Details: foot drop noted       Communication Communication Communication: Expressive difficulties   Cognition  Arousal/Alertness: Awake/alert Behavior During Therapy: WFL for tasks assessed/performed Overall Cognitive Status: Within Functional Limits for tasks assessed                                                  Home Living Family/patient expects to be discharged to:: Private residence Living Arrangements: Spouse/significant other Available Help at Discharge: Family Type of Home: House Home Access: Stairs to enter [employer] of Steps: threshold   Home Layout: One level           Bathroom Accessibility: No   Home Equipment: Rollator (4 wheels);Rolling Walker (2 wheels)          Prior Functioning/Environment Prior Level of Function : History of Falls (last six months);Independent/Modified Independent             Mobility Comments: RW in bathroom, rollator majority of time. Reports 18 falls last year and 10 falls since Jan this year ADLs Comments: intermittent assist for LBD        OT Problem List: Decreased strength;Decreased range of motion;Decreased activity tolerance;Impaired balance (sitting and/or standing);Decreased safety awareness      OT Treatment/Interventions: Self-care/ADL training;Therapeutic exercise;Energy conservation;DME and/or AE instruction;Neuromuscular education;Therapeutic activities;Patient/family education;Balance training    OT Goals(Current goals can be found in the care plan section) Acute Rehab OT Goals Patient Stated Goal: to not fall OT Goal Formulation: With patient/family Time For Goal Achievement: 03/30/22 Potential to Achieve Goals: Good ADL Goals Pt Will Perform Grooming: standing;Independently Pt Will Perform Lower Body Dressing: with modified independence;sit to/from stand Pt Will Transfer to Toilet: with modified independence;ambulating;regular height toilet  OT Frequency: Min 3X/week    Co-evaluation              AM-PAC OT "6 Clicks" Daily Activity     Outcome Measure Help from another  person eating meals?: A Little Help from another person taking care of personal grooming?: A Little Help from another person toileting, which includes using toliet, bedpan, or urinal?: A Little Help from another person bathing (including washing, rinsing, drying)?: A Lot Help from another person to put on and taking off regular upper body clothing?: A Little Help from another person to put on and taking off regular lower body clothing?: A Little 6 Click Score: 17   End of Session Equipment Utilized During Treatment: Rolling walker (2 wheels);Gait belt  Activity Tolerance: Patient tolerated treatment well Patient left: in bed;with bed alarm set;with call bell/phone within reach;with family/visitor present  OT Visit Diagnosis: Other abnormalities of gait and mobility (R26.89);Muscle weakness (generalized) (M62.81)                Time: [id number] OT Time Calculation (min): 25 min Charges:  OT General Charges $OT Visit: 1 Visit OT Evaluation $OT Eval Low Complexity: 1 Low OT Treatments $Self Care/Home Management : 8-22 mins  Dessie Coma, M.S. OTR/L  03/16/22, 12:50 PM  ascom [phone]

## 2022-03-16 NOTE — Progress Notes (Signed)
Echo currently being done at the bedside; to come back for shift assessment

## 2022-03-16 NOTE — Progress Notes (Signed)
SLP Cancellation Note  Patient Details Name: Todd Golden. MRN: [id number] DOB: Sep 08, 1930   Cancelled treatment:       Reason Eval/Treat Not Completed: SLP screened, no needs identified, will sign off  Pt's expressive abilities are unchanged from baseline.   Chequita Mofield B. Rutherford Nail, M.S., CCC-SLP, Mining engineer Certified Brain Injury [facility]  [place] Office [phone] Ascom [phone] Fax [phone]

## 2022-03-16 NOTE — Assessment & Plan Note (Addendum)
-   The patient will be admitted to an observation medically monitored bed. - This is manifested by right lower extremity weakness/paresis.  His onset of symptoms with more than 24 hours ago and therefore he was not a candidate for thrombolytic therapy. - We will follow neuro checks q.4 hours for 24 hours.   - The patient will be placed on aspirin and will add Plavix.   - Will obtain a 2D echo with bubble study and CTA of the head and neck.   - A neurology consultation  as well as physical/occupation/speech therapy consults will be obtained in a.m.Marland Kitchen - I notified Dr. Rory Percy about the patient. - The patient will be placed on statin therapy and fasting lipids will be checked.

## 2022-03-16 NOTE — Assessment & Plan Note (Signed)
-   We will continue his statin therapy and check fasting lipids.

## 2022-03-16 NOTE — Plan of Care (Signed)
Problem: Education: Goal: Knowledge of disease or condition will improve 03/16/2022 1032 by Oris Drone, RN Outcome: Progressing 03/16/2022 1031 by Oris Drone, RN Outcome: Progressing Goal: Knowledge of secondary prevention will improve (MUST DOCUMENT ALL) 03/16/2022 1032 by Oris Drone, RN Outcome: Progressing 03/16/2022 1031 by Oris Drone, RN Outcome: Progressing Goal: Knowledge of patient specific risk factors will improve Todd Golden N/A or DELETE if not current risk factor) 03/16/2022 1032 by Oris Drone, RN Outcome: Progressing 03/16/2022 1031 by Oris Drone, RN Outcome: Progressing   Problem: Ischemic Stroke/TIA Tissue Perfusion: Goal: Complications of ischemic stroke/TIA will be minimized 03/16/2022 1032 by Oris Drone, RN Outcome: Progressing 03/16/2022 1031 by Oris Drone, RN Outcome: Progressing   Problem: Coping: Goal: Will verbalize positive feelings about self 03/16/2022 1032 by Oris Drone, RN Outcome: Progressing 03/16/2022 1031 by Oris Drone, RN Outcome: Progressing Goal: Will identify appropriate support needs 03/16/2022 1032 by Oris Drone, RN Outcome: Progressing 03/16/2022 1031 by Oris Drone, RN Outcome: Progressing   Problem: Health Behavior/Discharge Planning: Goal: Ability to manage health-related needs will improve 03/16/2022 1032 by Oris Drone, RN Outcome: Progressing 03/16/2022 1031 by Oris Drone, RN Outcome: Progressing Goal: Goals will be collaboratively established with patient/family 03/16/2022 1032 by Oris Drone, RN Outcome: Progressing 03/16/2022 1031 by Oris Drone, RN Outcome: Progressing   Problem: Self-Care: Goal: Ability to participate in self-care as condition permits will improve 03/16/2022 1032 by Oris Drone, RN Outcome: Progressing 03/16/2022 1031 by Oris Drone, RN Outcome: Progressing Goal: Verbalization of  feelings and concerns over difficulty with self-care will improve 03/16/2022 1032 by Oris Drone, RN Outcome: Progressing 03/16/2022 1031 by Oris Drone, RN Outcome: Progressing Goal: Ability to communicate needs accurately will improve 03/16/2022 1032 by Oris Drone, RN Outcome: Progressing 03/16/2022 1031 by Oris Drone, RN Outcome: Progressing   Problem: Nutrition: Goal: Risk of aspiration will decrease 03/16/2022 1032 by Oris Drone, RN Outcome: Progressing 03/16/2022 1031 by Oris Drone, RN Outcome: Progressing Goal: Dietary intake will improve 03/16/2022 1032 by Oris Drone, RN Outcome: Progressing 03/16/2022 1031 by Oris Drone, RN Outcome: Progressing   Problem: Education: Goal: Knowledge of General Education information will improve Description: Including pain rating scale, medication(s)/side effects and non-pharmacologic comfort measures 03/16/2022 1032 by Oris Drone, RN Outcome: Progressing 03/16/2022 1031 by Oris Drone, RN Outcome: Progressing   Problem: Health Behavior/Discharge Planning: Goal: Ability to manage health-related needs will improve 03/16/2022 1032 by Oris Drone, RN Outcome: Progressing 03/16/2022 1031 by Oris Drone, RN Outcome: Progressing   Problem: Clinical Measurements: Goal: Ability to maintain clinical measurements within normal limits will improve 03/16/2022 1032 by Oris Drone, RN Outcome: Progressing 03/16/2022 1031 by Oris Drone, RN Outcome: Progressing Goal: Will remain free from infection 03/16/2022 1032 by Oris Drone, RN Outcome: Progressing 03/16/2022 1031 by Oris Drone, RN Outcome: Progressing Goal: Diagnostic test results will improve 03/16/2022 1032 by Oris Drone, RN Outcome: Progressing 03/16/2022 1031 by Oris Drone, RN Outcome: Progressing Goal: Respiratory complications will improve 03/16/2022 1032 by  Oris Drone, RN Outcome: Progressing 03/16/2022 1031 by Oris Drone, RN Outcome: Progressing Goal: Cardiovascular complication will be avoided 03/16/2022 1032 by Oris Drone, RN Outcome: Progressing 03/16/2022 1031 by Oris Drone, RN Outcome: Progressing   Problem: Activity: Goal: Risk for activity intolerance will decrease 03/16/2022 1032 by Oris Drone, RN Outcome: Progressing  03/16/2022 1031 by Oris Drone, RN Outcome: Progressing   Problem: Nutrition: Goal: Adequate nutrition will be maintained 03/16/2022 1032 by Oris Drone, RN Outcome: Progressing 03/16/2022 1031 by Oris Drone, RN Outcome: Progressing   Problem: Coping: Goal: Level of anxiety will decrease 03/16/2022 1032 by Oris Drone, RN Outcome: Progressing 03/16/2022 1031 by Oris Drone, RN Outcome: Progressing   Problem: Elimination: Goal: Will not experience complications related to bowel motility 03/16/2022 1032 by Oris Drone, RN Outcome: Progressing 03/16/2022 1031 by Oris Drone, RN Outcome: Progressing Goal: Will not experience complications related to urinary retention 03/16/2022 1032 by Oris Drone, RN Outcome: Progressing 03/16/2022 1031 by Oris Drone, RN Outcome: Progressing   Problem: Pain Managment: Goal: General experience of comfort will improve 03/16/2022 1032 by Oris Drone, RN Outcome: Progressing 03/16/2022 1031 by Oris Drone, RN Outcome: Progressing   Problem: Safety: Goal: Ability to remain free from injury will improve 03/16/2022 1032 by Oris Drone, RN Outcome: Progressing 03/16/2022 1031 by Oris Drone, RN Outcome: Progressing  Patient High Fall Risks due to history of mulitple falls at home; Problem: Skin Integrity: Goal: Risk for impaired skin integrity will decrease 03/16/2022 1032 by Oris Drone, RN Outcome: Progressing 03/16/2022 1031 by Oris Drone, RN Outcome: Progressing

## 2022-03-16 NOTE — Consult Note (Signed)
Neurology Consultation  Reason for Consult: Right leg weakness, stroke Referring Physician: Dr. Priscella Mann  CC: Right leg weakness and stroke  History is obtained from: Wife, son at bedside  HPI: Todd Golden. is a 89 y.o. male past medical history of a prior left MCA stroke with residual aphasia, paroxysmal atrial fibrillation not on anticoagulation due to falls, hypertension, hyperlipidemia, requiring help with most ADLs at home which his wife helps him with, presenting to the emergency room with complaints of increasing falls 2 days prior to presentation and on the day of presentation.  He does have a history of frequent falls-has had 18 falls last year.  Family also noticed that he was dragging his right leg after the fall which is not usual for him.  MRI of the brain was done that showed a punctate infarct in the superior left frontal lobe.  He was admitted for further workup. Wife reports that he has had aphasia since his last stroke in 2015.  She takes care of him at home.  He uses a walker to walk but still frequently falls.  He tries to communicate and gets frustrated when he cannot get all of his words out.   LKW: 10pm Friday IV thrombolysis given?: no, OSW EVT: OSW Premorbid modified Rankin scale (mRS): 3-4   ROS: Full ROS was performed and is negative except as noted in the HPI.  Past Medical History:  Diagnosis Date   Allergy    Arthritis    Asthma    Cataract    Chronic constipation    Colon polyps    Cough    GERD (gastroesophageal reflux disease)    Glaucoma    HOH (hard of hearing)    a. Uses hearing aids   Hyperlipidemia    Hypertension    Macular degeneration    Nephrolithiasis    stent   PAF (paroxysmal atrial fibrillation) ([place])    a. 11/2013 Dx in setting of L MCA stroke. CHA2DS2VASc = 5-->Eliquis; b. 11/2013 Echo: Nl LV fxn. Mildly dil LA.   Stroke [facility])    a. 11/2013 L MCA stroke.   Ureteral stricture      Family History  Problem Relation  Age of Onset   Arthritis Mother    Cancer Mother 27       kidney   Stroke Father      Social History:   reports that he has never smoked. He has never been exposed to tobacco smoke. He has never used smokeless tobacco. He reports current alcohol use. He reports that he does not use drugs.  Medications  Current Facility-Administered Medications:     stroke: early stages of recovery book, , Does not apply, Once, Mansy, Jan A, MD   0.9 %  sodium chloride infusion, , Intravenous, Continuous, Mansy, Jan A, MD, Last Rate: 100 mL/hr at 03/16/22 0048, New Bag at 03/16/22 0048   acetaminophen (TYLENOL) tablet 650 mg, 650 mg, Oral, Q6H PRN **OR** acetaminophen (TYLENOL) suppository 650 mg, 650 mg, Rectal, Q6H PRN, Mansy, Jan A, MD   amLODipine (NORVASC) tablet 5 mg, 5 mg, Oral, Daily, Mansy, Jan A, MD   aspirin EC tablet 81 mg, 81 mg, Oral, Daily, Mansy, Jan A, MD   benzonatate (TESSALON) capsule 100 mg, 100 mg, Oral, TID PRN, Mansy, Jan A, MD   clopidogrel (PLAVIX) tablet 75 mg, 75 mg, Oral, Daily, Mansy, Jan A, MD   donepezil (ARICEPT) tablet 10 mg, 10 mg, Oral, Daily, Mansy, Arvella Merles, MD  enoxaparin (LOVENOX) injection 40 mg, 40 mg, Subcutaneous, Q24H, Mansy, Jan A, MD   EPINEPHrine (EPI-PEN) injection 0.3 mg, 0.3 mg, Intramuscular, PRN, Mansy, Jan A, MD   latanoprost (XALATAN) 0.005 % ophthalmic solution 1 drop, 1 drop, Both Eyes, QHS, Mansy, Jan A, MD, 1 drop at 03/16/22 0048   magnesium hydroxide (MILK OF MAGNESIA) suspension 30 mL, 30 mL, Oral, Daily PRN, Mansy, Jan A, MD   multivitamin (PROSIGHT) tablet 1 tablet, 1 tablet, Oral, Daily, Mansy, Jan A, MD   ondansetron (ZOFRAN) tablet 4 mg, 4 mg, Oral, Q6H PRN **OR** ondansetron (ZOFRAN) injection 4 mg, 4 mg, Intravenous, Q6H PRN, Mansy, Jan A, MD   pantoprazole (PROTONIX) EC tablet 40 mg, 40 mg, Oral, Daily, Mansy, Jan A, MD   pindolol (VISKEN) tablet 10 mg, 10 mg, Oral, Daily, Mansy, Jan A, MD   sertraline (ZOLOFT) tablet 50 mg, 50 mg, Oral,  Daily, Mansy, Jan A, MD   traZODone (DESYREL) tablet 25 mg, 25 mg, Oral, QHS PRN, Mansy, Arvella Merles, MD   Exam: Current vital signs: BP 130/88 (BP Location: Right Arm)   Pulse 85   Temp 97.9 F (36.6 C)   Resp 18   Ht '5\' 11"'$  (1.803 m)   Wt 79.3 kg   SpO2 95%   BMI 24.38 kg/m  Vital signs in last 24 hours: Temp:  [97.9 F (36.6 C)-98.2 F (36.8 C)] 97.9 F (36.6 C) (02/24 0846) Pulse Rate:  [83-107] 85 (02/24 0846) Resp:  [15-19] 18 (02/24 0846) BP: (130-153)/(85-105) 130/88 (02/24 0846) SpO2:  [94 %-100 %] 95 % (02/24 0846) Weight:  [79.3 kg] 79.3 kg (02/24 0200) General: Awake alert in no distress HEENT: Normocephalic atraumatic Lungs: Clear CVS: Regular rate rhythm Neurological exam He is awake alert oriented to self He could tell me that he is in the hospital but could not name the hospital. He could not tell me the month He told me his age correctly after coaching-that he is 26 when he actually is 89 years old. He was able to name some simple objects consistently. He was able to repeat simple sentences pretty decently with mild dysarthria He was able to follow simple commands but not multistep commands. Cranial nerves II to XII intact Motor examination with no drift in any of the upper extremities, no drift in the lower extremities either Sensation: Intact Coordination difficult to get him to follow complex commands but no gross dysmetria noted NIH stroke scale 1a Level of Conscious.: 0 1b LOC Questions: 2 1c LOC Commands: 0 2 Best Gaze: 0 3 Visual: 0 4 Facial Palsy: 0 5a Motor Arm - left: 0 5b Motor Arm - Right: 0 6a Motor Leg - Left: 0 6b Motor Leg - Right: 0 7 Limb Ataxia: 0 8 Sensory: 0 9 Best Language: 1 10 Dysarthria: 1 11 Extinct. and Inatten.: 0 TOTAL: 4   Labs I have reviewed labs in epic and the results pertinent to this consultation are: CBC    Component Value Date/Time   WBC 7.2 03/16/2022 0448   RBC 4.53 03/16/2022 0448   HGB 13.7  03/16/2022 0448   HGB 13.0 07/04/2016 1001   HCT 41.6 03/16/2022 0448   HCT 38.9 07/04/2016 1001   PLT 218 03/16/2022 0448   PLT 229 07/04/2016 1001   MCV 91.8 03/16/2022 0448   MCV 93 07/04/2016 1001   MCV 94 12/04/2013 0549   MCH 30.2 03/16/2022 0448   MCHC 32.9 03/16/2022 0448   RDW 13.5 03/16/2022 0448  RDW 13.7 07/04/2016 1001   RDW 12.9 12/04/2013 0549   LYMPHSABS 2.0 03/15/2022 1610   LYMPHSABS 2.1 12/04/2013 0549   MONOABS 1.1 (H) 03/15/2022 1610   MONOABS 0.9 12/04/2013 0549   EOSABS 0.2 03/15/2022 1610   EOSABS 0.2 12/04/2013 0549   BASOSABS 0.0 03/15/2022 1610   BASOSABS 0.0 12/04/2013 0549    CMP     Component Value Date/Time   NA 138 03/16/2022 0448   NA 140 07/04/2016 1001   NA 144 12/04/2013 0549   K 3.5 03/16/2022 0448   K 3.5 12/04/2013 0549   CL 101 03/16/2022 0448   CL 107 12/04/2013 0549   CO2 27 03/16/2022 0448   CO2 30 12/04/2013 0549   GLUCOSE 104 (H) 03/16/2022 0448   GLUCOSE 93 12/04/2013 0549   BUN 18 03/16/2022 0448   BUN 20 07/04/2016 1001   BUN 16 12/04/2013 0549   CREATININE 1.15 03/16/2022 0448   CREATININE 1.11 12/04/2013 0549   CALCIUM 9.1 03/16/2022 0448   CALCIUM 8.2 (L) 12/04/2013 0549   PROT 7.1 03/15/2022 1610   PROT 7.2 07/04/2016 1001   PROT 7.2 12/03/2013 0913   ALBUMIN 4.0 03/15/2022 1610   ALBUMIN 4.4 07/04/2016 1001   ALBUMIN 3.5 12/03/2013 0913   AST 31 03/15/2022 1610   AST 30 12/03/2013 0913   ALT 25 03/15/2022 1610   ALT 29 12/03/2013 0913   ALKPHOS 47 03/15/2022 1610   ALKPHOS 60 12/03/2013 0913   BILITOT 0.8 03/15/2022 1610   BILITOT 0.6 07/04/2016 1001   BILITOT 0.6 12/03/2013 0913   GFRNONAA >60 03/16/2022 0448   GFRNONAA >60 12/04/2013 0549   GFRAA >60 07/29/2017 0812   GFRAA >60 12/04/2013 0549    Lipid Panel     Component Value Date/Time   CHOL 165 03/16/2022 0448   CHOL 133 12/18/2020 0819   CHOL 168 12/04/2013 0549   TRIG 109 03/16/2022 0448   TRIG 96 12/04/2013 0549   HDL 50 03/16/2022  0448   HDL 55 12/18/2020 0819   HDL 44 12/04/2013 0549   CHOLHDL 3.3 03/16/2022 0448   VLDL 22 03/16/2022 0448   VLDL 19 12/04/2013 0549   LDLCALC 93 03/16/2022 0448   LDLCALC 60 12/18/2020 0819   LDLCALC 105 (H) 12/04/2013 0549    A1c-not ordered during admission.  Ordered now.  Imaging I have reviewed the images obtained:  MRI of the head shows a punctate stroke in the left frontal area, appears embolic.  Assessment:  89 year old man with above medical history that includes paroxysmal A-fib not on anticoagulation due to falls amongst other cerebrovascular risk factors, prior history of stroke with residual aphasia and gait disturbance, presenting for evaluation of worsening frequency of falls and dragging his right leg. The frontal location of his stroke corroborates with the right leg weakness that he has had. Although on my examination, he had good strength in that leg by now. I discussed the role of anticoagulation and prevention of strokes and atrial fibrillation and the family is concerned that he will have more bleeds and they have discussed this with his outpatient providers before and decided that he will be on aspirin only.  Impression: Acute ischemic stroke-cardioembolic  Recommendations: Frequent neurochecks Telemetry Echo completed-follow report. Follow A1c-goal is less than 7. Aspirin 81.  Prevention. Plavix was added by the admitting hospitalist, but I do not think any cardioembolic situation will antiplatelets would be of any benefit.  Anticoagulation has been discussed and family has decided not  to anticoagulate him for the risk of falls and bleeds, and I would let them keep having these discussions with outpatient providers. I am discontinuing Plavix for now He is not on cholesterol-lowering medications-LDL is 60 which is below the goal of 70.  No need to start statin given advanced age and LDL at goal. PT OT speech therapy Family is concerned about wife being  able to care for him at home with the new weakness.  Therapy may need to evaluate to see if he is a candidate for rehab prior to return to home. Plan was relayed to Dr. Priscella Mann I will follow the echo with you.  -- Amie Portland, MD Neurologist [facility] Pager: [phone]

## 2022-03-16 NOTE — Plan of Care (Signed)
  Problem: Education: Goal: Knowledge of disease or condition will improve Outcome: Progressing Goal: Knowledge of secondary prevention will improve (MUST DOCUMENT ALL) Outcome: Progressing Goal: Knowledge of patient specific risk factors will improve [name] N/A or DELETE if not current risk factor) Outcome: Progressing   Problem: Ischemic Stroke/TIA Tissue Perfusion: Goal: Complications of ischemic stroke/TIA will be minimized Outcome: Progressing   Problem: Coping: Goal: Will identify appropriate support needs Outcome: Progressing   Problem: Health Behavior/Discharge Planning: Goal: Goals will be collaboratively established with patient/family Outcome: Progressing   Problem: Self-Care: Goal: Ability to participate in self-care as condition permits will improve Outcome: Progressing Goal: Verbalization of feelings and concerns over difficulty with self-care will improve Outcome: Progressing Goal: Ability to communicate needs accurately will improve Outcome: Progressing   Problem: Nutrition: Goal: Risk of aspiration will decrease Outcome: Progressing   Problem: Education: Goal: Knowledge of General Education information will improve Description: Including pain rating scale, medication(s)/side effects and non-pharmacologic comfort measures Outcome: Progressing   Problem: Clinical Measurements: Goal: Will remain free from infection Outcome: Progressing   Problem: Activity: Goal: Risk for activity intolerance will decrease Outcome: Progressing   Problem: Nutrition: Goal: Adequate nutrition will be maintained Outcome: Progressing   Problem: Coping: Goal: Level of anxiety will decrease Outcome: Progressing   Problem: Pain Managment: Goal: General experience of comfort will improve Outcome: Progressing

## 2022-03-16 NOTE — Assessment & Plan Note (Signed)
-   We will continue his beta-blocker therapy and aspirin. - The patient is not a candidate for anticoagulation at this time due to fall risk.

## 2022-03-16 NOTE — Evaluation (Signed)
Physical Therapy Evaluation Patient Details Name: Todd Golden. MRN: [id number] DOB: 01/02/1931 Today's Date: 03/16/2022  History of Present Illness  Todd Sentz. is a 89 y.o. male past medical history of a prior left MCA stroke with residual aphasia, paroxysmal atrial fibrillation not on anticoagulation due to falls, hypertension, hyperlipidemia, presenting to the emergency room with complaints of increasing falls 2 days prior to presentation and on the day of presentation.  He does have a history of frequent falls-has had 18 falls last year. MRI of the brain was done that showed a punctate infarct in the superior left frontal lobe.  Clinical Impression  Patient resting in bed upon arrival; son and wife at bedside.  Family initially voicing concerns over change in verbal expression/speech and overall level of alertness; care team called to bedside for assessment, felt to be expected evolution of known CVA.  Cleared to continue with evaluation. Patient oriented to self, general situation; follows simple verbal commands, but does often require therapist demonstration or hand-over-hand for full task comprehension and performance (HOH vs language deficit?).  Patient generally weak and deconditioned, but focal weakness noted throughout R LE (distal > proximal).  Currently requiring mod assist for bed mobility; close sup for static sitting balance, mod/max assist for dynamic sitting balance; mod assist +1 for sit/stand, standing balance and bed/chair transfer with RW.  Requires consistent cuing/assist for walker position/management, weight shift and overall balance/safety.  Anticipate need for +2 versus chair follow for continued gait efforts at this time; will continue to assess/progress as appropriate next session. Would benefit from skilled PT to address above deficits and promote optimal return to PLOF.; recommend transition to acute inpatient rehab upon discharge for high-intensity,  post-acute rehab services.        Recommendations for follow up therapy are one component of a multi-disciplinary discharge planning process, led by the attending physician.  Recommendations may be updated based on patient status, additional functional criteria and insurance authorization.  Follow Up Recommendations Acute inpatient rehab (3hours/day)      Assistance Recommended at Discharge Frequent or constant Supervision/Assistance  Patient can return home with the following  A lot of help with walking and/or transfers;A lot of help with bathing/dressing/bathroom    Equipment Recommendations    Recommendations for Other Services       Functional Status Assessment Patient has had a recent decline in their functional status and demonstrates the ability to make significant improvements in function in a reasonable and predictable amount of time.     Precautions / Restrictions Precautions Precautions: Fall Restrictions Weight Bearing Restrictions: No      Mobility  Bed Mobility Overal bed mobility: Needs Assistance Bed Mobility: Supine to Sit     Supine to sit: Mod assist     General bed mobility comments: difficulty with motor planning and task sequencing without gestures, hand-over-hand to complete    Transfers Overall transfer level: Needs assistance Equipment used: Rolling walker (2 wheels) Transfers: Sit to/from Stand, Bed to chair/wheelchair/BSC Sit to Stand: Mod assist Stand pivot transfers: Mod assist         General transfer comment: assist for lift off, anterior weight translation; mildly excessive posterior weight shift, min/mod assist to correct and maintain static standing balance.  Mod assist for walker position/management with transfer; mod assist for balance and weight shift to unweight/advance LEs, R > L    Ambulation/Gait               General Gait Details:  deferred this date  Stairs            Wheelchair Mobility    Modified  Rankin (Stroke Patients Only)       Balance Overall balance assessment: Needs assistance Sitting-balance support: No upper extremity supported, Feet supported Sitting balance-Leahy Scale: Fair Sitting balance - Comments: R post/lateral lean, close sup for static sitting balance, mod/max assist to maintain with dynamic activities   Standing balance support: Bilateral upper extremity supported Standing balance-Leahy Scale: Poor                               Pertinent Vitals/Pain Pain Assessment Pain Assessment: No/denies pain    Home Living Family/patient expects to be discharged to:: Private residence Living Arrangements: Spouse/significant other Available Help at Discharge: Family Type of Home: House Home Access: Stairs to enter   [employer] of Steps: threshold (1)   Home Layout: One level Home Equipment: Rollator (4 wheels);Rolling Walker (2 wheels)      Prior Function Prior Level of Function : History of Falls (last six months);Independent/Modified Independent             Mobility Comments: RW in bathroom, rollator majority of time. Reports 18 falls last year and 10 falls since Jan this year ADLs Comments: intermittent assist for LBD     Hand Dominance   Dominant Hand: Right    Extremity/Trunk Assessment   Upper Extremity Assessment Upper Extremity Assessment: Generalized weakness (grossly at least 4/5 throughout; no focal weakness appreciated)    Lower Extremity Assessment Lower Extremity Assessment:  (L LE grossly 4+/5 throughout; R LE grossly 4-/5 at hip and knee, 3-/5 ankle)       Communication   Communication: Expressive difficulties  Cognition Arousal/Alertness: Awake/alert Behavior During Therapy: WFL for tasks assessed/performed Overall Cognitive Status: Within Functional Limits for tasks assessed                                 General Comments: Alert and oriented to self, location; follows simple  commands with increased time for processing/task initiation (intermittently requiring therapist demonstration-HOH vs difficulty with comprension?)        General Comments      Exercises     Assessment/Plan    PT Assessment Patient needs continued PT services  PT Problem List Decreased strength;Decreased range of motion;Decreased activity tolerance;Decreased balance;Decreased mobility;Decreased coordination;Decreased cognition;Decreased knowledge of use of DME;Decreased safety awareness;Decreased knowledge of precautions;Cardiopulmonary status limiting activity       PT Treatment Interventions DME instruction;Gait training;Functional mobility training;Therapeutic activities;Stair training;Therapeutic exercise;Balance training;Cognitive remediation;Neuromuscular re-education;Patient/family education    PT Goals (Current goals can be found in the Care Plan section)  Acute Rehab PT Goals Patient Stated Goal: to get stronger and return home PT Goal Formulation: With patient/family Time For Goal Achievement: 03/30/22 Potential to Achieve Goals: Good    Frequency 7X/week     Co-evaluation               AM-PAC PT "6 Clicks" Mobility  Outcome Measure Help needed turning from your back to your side while in a flat bed without using bedrails?: A Little Help needed moving from lying on your back to sitting on the side of a flat bed without using bedrails?: A Lot Help needed moving to and from a bed to a chair (including a wheelchair)?: A Lot Help needed standing up from  a chair using your arms (e.g., wheelchair or bedside chair)?: A Lot Help needed to walk in hospital room?: A Lot Help needed climbing 3-5 steps with a railing? : A Lot 6 Click Score: 13    End of Session Equipment Utilized During Treatment: Gait belt Activity Tolerance: Patient tolerated treatment well Patient left: in chair;with call bell/phone within reach;with chair alarm set;with family/visitor  present Nurse Communication: Mobility status PT Visit Diagnosis: Muscle weakness (generalized) (M62.81);Difficulty in walking, not elsewhere classified (R26.2);Hemiplegia and hemiparesis Hemiplegia - Right/Left: Right Hemiplegia - dominant/non-dominant: Dominant Hemiplegia - caused by: Cerebral infarction    Time: 1440-1513 PT Time Calculation (min) (ACUTE ONLY): 33 min   Charges:   PT Evaluation $PT Eval Moderate Complexity: 1 Mod PT Treatments $Therapeutic Activity: 8-22 mins       Sarrah Fiorenza H. Owens Shark, PT, DPT, NCS 03/16/22, 4:44 PM [phone]

## 2022-03-16 NOTE — Assessment & Plan Note (Signed)
-   We will continue his Aricept and Zoloft.

## 2022-03-16 NOTE — Assessment & Plan Note (Signed)
-   We will continue his antihypertensives.  His onset of symptoms was more than 24 hours ago.

## 2022-03-17 DIAGNOSIS — I639 Cerebral infarction, unspecified: Secondary | ICD-10-CM | POA: Diagnosis not present

## 2022-03-17 MED ORDER — SIMVASTATIN 20 MG PO TABS: 20.0000 mg | ORAL_TABLET | Freq: Every day | ORAL | Status: DC

## 2022-03-17 MED ORDER — DOXAZOSIN MESYLATE 4 MG PO TABS
4.0000 mg | ORAL_TABLET | Freq: Every day | ORAL | Status: DC
  Administered 2022-03-17 – 2022-03-19 (×3): 4 mg via ORAL
  Filled 2022-03-17 (×3): qty 1

## 2022-03-17 MED ORDER — ROSUVASTATIN CALCIUM 20 MG PO TABS
20.0000 mg | ORAL_TABLET | Freq: Every day | ORAL | Status: DC
  Administered 2022-03-17 – 2022-03-19 (×3): 20 mg via ORAL
  Filled 2022-03-17 (×3): qty 1

## 2022-03-17 NOTE — Plan of Care (Signed)
  Problem: Clinical Measurements: Goal: Respiratory complications will improve Outcome: Progressing   Problem: Education: Goal: Knowledge of disease or condition will improve Outcome: Progressing Goal: Knowledge of secondary prevention will improve (MUST DOCUMENT ALL) Outcome: Progressing Goal: Knowledge of patient specific risk factors will improve [name] N/A or DELETE if not current risk factor) Outcome: Progressing   Problem: Ischemic Stroke/TIA Tissue Perfusion: Goal: Complications of ischemic stroke/TIA will be minimized Outcome: Progressing   Problem: Coping: Goal: Will verbalize positive feelings about self Outcome: Progressing Goal: Will identify appropriate support needs Outcome: Progressing   Problem: Health Behavior/Discharge Planning: Goal: Ability to manage health-related needs will improve Outcome: Progressing Goal: Goals will be collaboratively established with patient/family Outcome: Progressing   Problem: Self-Care: Goal: Ability to participate in self-care as condition permits will improve Outcome: Progressing Goal: Verbalization of feelings and concerns over difficulty with self-care will improve Outcome: Progressing Goal: Ability to communicate needs accurately will improve Outcome: Progressing   Problem: Nutrition: Goal: Risk of aspiration will decrease Outcome: Progressing Goal: Dietary intake will improve Outcome: Progressing   Problem: Education: Goal: Knowledge of General Education information will improve Description: Including pain rating scale, medication(s)/side effects and non-pharmacologic comfort measures Outcome: Progressing   Problem: Health Behavior/Discharge Planning: Goal: Ability to manage health-related needs will improve Outcome: Progressing   Problem: Clinical Measurements: Goal: Ability to maintain clinical measurements within normal limits will improve Outcome: Progressing Goal: Will remain free from infection Outcome:  Progressing Goal: Diagnostic test results will improve Outcome: Progressing Goal: Respiratory complications will improve Outcome: Progressing Goal: Cardiovascular complication will be avoided Outcome: Progressing   Problem: Activity: Goal: Risk for activity intolerance will decrease Outcome: Progressing   Problem: Nutrition: Goal: Adequate nutrition will be maintained Outcome: Progressing   Problem: Coping: Goal: Level of anxiety will decrease Outcome: Progressing   Problem: Elimination: Goal: Will not experience complications related to bowel motility Outcome: Progressing Goal: Will not experience complications related to urinary retention Outcome: Progressing   Problem: Pain Managment: Goal: General experience of comfort will improve Outcome: Progressing   Problem: Safety: Goal: Ability to remain free from injury will improve Outcome: Progressing   Problem: Skin Integrity: Goal: Risk for impaired skin integrity will decrease Outcome: Progressing

## 2022-03-17 NOTE — Progress Notes (Addendum)
PROGRESS NOTE    Todd Golden.  [id number] DOB: 19-Sep-1930 DOA: 03/15/2022 PCP: Crecencio Mc, MD    Brief Narrative:  89 y.o. Caucasian male with medical history significant for GERD, hypertension, dyslipidemia, nephrolithiasis and paroxysmal atrial fibrillation as well as CVA, who presented to the ER with acute onset of recurrent falls with 2 falls yesterday and 2 falls during the day as well as 1 in the ER.  His family noted that he was dragging his right lower extremity.  The patient denies any headache or dizziness or blurred vision.  He denies any paresthesias or right upper extremity weakness.  No fever or chills.  No nausea or vomiting or abdominal pain.  He denies any urinary or stool incontinence.  He has expressive aphasia from previous stroke and believes that it is slightly worse.  No chest pain or palpitations.  No cough or wheezing or hemoptysis.  No other bleeding diathesis.    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  Some residual deficit.  Remainder of CVA workup unrevealing.  No large vessel occlusion on CT angio. 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  Needs CIR.  Workup in progress.   Level of care: Telemetry Medical  Consultants:  Neurology  Procedures:  None  Antimicrobials: None   Subjective: Seen and examined peer resting bed.  No visible distress.  Family concerned about slight alterations in speech pattern and mentation  Objective: Vitals:   03/16/22 2031 03/17/22 0047 03/17/22 0050 03/17/22 0908  BP:  (!) 144/93  (!) 130/94  Pulse:  (!) 43 97 81  Resp: '18 19  18  '$ Temp:  98.7 F (37.1 C)  98.6 F (37 C)  TempSrc:      SpO2:  94%  98%  Weight:      Height:        Intake/Output Summary (Last 24 hours) at 03/17/2022 0941 Last data filed at 03/16/2022 1644 Gross per 24 hour  Intake 513.91 ml  Output 700 ml  Net -186.09 ml   Filed Weights   03/16/22 0200  Weight: 79.3 kg    Examination:  General exam: Appears calm and comfortable  Respiratory system: Clear to auscultation. Respiratory effort normal. Cardiovascular system: S1-S2, regular rate, irregular rhythm, no murmurs Gastrointestinal system: Soft, NT/ND, normal bowel sounds Central nervous system: Alert to x 2, no focal deficits Extremities: Symmetric 5 x 5 power. Skin: No rashes, lesions or ulcers Psychiatry: Judgement and insight appear normal. Mood & affect flattened.     Data Reviewed: I have personally reviewed following labs and imaging studies  CBC: Recent Labs  Lab 03/15/22 1610 03/16/22 0448  WBC 7.6 7.2  NEUTROABS 4.3  --   HGB 13.9 13.7  HCT 41.9 41.6  MCV 91.7 91.8  PLT 244 [id number]   Basic Metabolic Panel: Recent Labs  Lab 03/15/22 1610 03/16/22 0448  NA 136 138  K 3.9 3.5  CL 99 101  CO2 26 27  GLUCOSE 101* 104*  BUN 20 18  CREATININE 1.22 1.15  CALCIUM 9.3 9.1   GFR: Estimated Creatinine Clearance: 46.4 mL/min (by C-G formula based  on SCr of 1.15 mg/dL). Liver Function Tests: Recent Labs  Lab 03/15/22 1610  AST 31  ALT 25  ALKPHOS 47  BILITOT 0.8  PROT 7.1  ALBUMIN 4.0   No results for input(s): "LIPASE", "AMYLASE" in the last 168 hours. No results for input(s): "AMMONIA" in the last 168 hours. Coagulation Profile: Recent Labs  Lab 03/15/22 1610  INR 1.1   Cardiac Enzymes: No results for input(s): "CKTOTAL", "CKMB", "CKMBINDEX", "TROPONINI" in the last 168 hours. BNP (last 3 results) No results for input(s): "PROBNP" in the last 8760 hours. HbA1C: Recent Labs    03/16/22 0445  HGBA1C 5.6   CBG: No results for input(s): "GLUCAP" in the last 168 hours. Lipid Profile: Recent Labs    03/16/22 0448  CHOL 165  HDL 50  LDLCALC 93  TRIG 109  CHOLHDL 3.3   Thyroid Function Tests: No results for input(s): "TSH", "T4TOTAL", "FREET4", "T3FREE", "THYROIDAB" in the last 72 hours. Anemia Panel: No results for input(s): "VITAMINB12", "FOLATE", "FERRITIN", "TIBC", "IRON", "RETICCTPCT" in the last 72 hours. Sepsis Labs: No results for input(s): "PROCALCITON", "LATICACIDVEN" in the last 168 hours.  No results found for this or any previous visit (from the past 240 hour(s)).       Radiology Studies: ECHOCARDIOGRAM COMPLETE BUBBLE STUDY  Result Date: 03/16/2022    ECHOCARDIOGRAM REPORT   Patient Name:   Todd Golden. Date of Exam: 03/16/2022 Medical Rec #:  [id number]            Height:       71.0 in Accession #:    [id number]           Weight:       174.8 lb Date of Birth:  Dec 17, 1930            BSA:          1.991 m Patient Age:    89 years             BP:           136/94 mmHg Patient Gender: M                    HR:           96 bpm. Exam Location:  [facility] Procedure: 2D Echo, Color Doppler, Cardiac Doppler and Saline Contrast Bubble            Study Indications:     Stroke 434.91  History:         Patient has no prior history of Echocardiogram examinations.                  Stroke; Risk  Factors:Dyslipidemia and Hypertension.  Sonographer:     . Thornton-Maynard Referring Phys:  [id number] A MANSY Diagnosing Phys: Fransico Him MD IMPRESSIONS  1. Left ventricular ejection fraction, by estimation, is 40 to 45%. The left ventricle has mildly decreased function. The left ventricle demonstrates global hypokinesis. Left  ventricular diastolic function could not be evaluated.  2. Right ventricular systolic function is normal. The right ventricular size is normal. There is normal pulmonary artery systolic pressure. The estimated right ventricular systolic pressure is Q000111Q mmHg.  3. The mitral valve is normal in structure. Trivial mitral valve regurgitation. No evidence of mitral stenosis.  4. The aortic valve is normal in structure. Aortic valve regurgitation is not visualized. Aortic valve sclerosis/calcification is present, without any evidence of aortic stenosis. Aortic valve area, by VTI measures 2.26 cm. Aortic valve mean gradient measures 2.7 mmHg. Aortic valve Vmax measures 1.15 m/s.  5. Aortic dilatation noted. There is mild dilatation of the ascending aorta, measuring 41 mm.  6. The inferior vena cava is normal in size with greater than 50% respiratory variability, suggesting right atrial pressure of 3 mmHg. Conclusion(s)/Recommendation(s): No intracardiac source of embolism detected on this transthoracic study. Consider a transesophageal echocardiogram to exclude cardiac source of embolism if clinically indicated. FINDINGS  Left Ventricle: Left ventricular ejection fraction, by estimation, is 40 to 45%. The left ventricle has mildly decreased function. The left ventricle demonstrates global hypokinesis. The left ventricular internal cavity size was normal in size. There is  no left ventricular hypertrophy. Left ventricular diastolic function could not be evaluated. Right Ventricle: The right ventricular size is normal. No increase in right ventricular wall thickness. Right ventricular systolic  function is normal. There is normal pulmonary artery systolic pressure. The tricuspid regurgitant velocity is 2.01 m/s, and  with an assumed right atrial pressure of 3 mmHg, the estimated right ventricular systolic pressure is Q000111Q mmHg. Left Atrium: Left atrial size was normal in size. Right Atrium: Right atrial size was normal in size. Pericardium: There is no evidence of pericardial effusion. Mitral Valve: The mitral valve is normal in structure. Mild mitral annular calcification. Trivial mitral valve regurgitation. No evidence of mitral valve stenosis. Tricuspid Valve: The tricuspid valve is normal in structure. Tricuspid valve regurgitation is trivial. No evidence of tricuspid stenosis. Aortic Valve: The aortic valve is normal in structure. Aortic valve regurgitation is not visualized. Aortic valve sclerosis/calcification is present, without any evidence of aortic stenosis. Aortic valve mean gradient measures 2.7 mmHg. Aortic valve peak  gradient measures 5.3 mmHg. Aortic valve area, by VTI measures 2.26 cm. Pulmonic Valve: The pulmonic valve was normal in structure. Pulmonic valve regurgitation is not visualized. No evidence of pulmonic stenosis. Aorta: Aortic dilatation noted. There is mild dilatation of the ascending aorta, measuring 41 mm. Venous: The inferior vena cava is normal in size with greater than 50% respiratory variability, suggesting right atrial pressure of 3 mmHg. IAS/Shunts: No atrial level shunt detected by color flow Doppler. Agitated saline contrast was given intravenously to evaluate for intracardiac shunting.  LEFT VENTRICLE PLAX 2D LVIDd:         5.30 cm     Diastology LVIDs:         3.60 cm     LV e' medial:    7.51 cm/s LV PW:         1.10 cm     LV E/e' medial:  10.3 LV IVS:        0.90 cm     LV e' lateral:   12.10 cm/s LVOT diam:     2.20 cm     LV E/e' lateral: 6.4 LV SV:         41 LV SV Index:   21 LVOT Area:     3.80 cm  LV Volumes (MOD)  LV vol d, MOD A2C: 85.3 ml LV vol d,  MOD A4C: 61.8 ml LV vol s, MOD A2C: 52.5 ml LV vol s, MOD A4C: 31.9 ml LV SV MOD A2C:     32.8 ml LV SV MOD A4C:     61.8 ml LV SV MOD BP:      33.5 ml RIGHT VENTRICLE RV S prime:     12.10 cm/s TAPSE (M-mode): 2.1 cm LEFT ATRIUM             Index        RIGHT ATRIUM           Index LA diam:        4.40 cm 2.21 cm/m   RA Area:     11.30 cm LA Vol (A2C):   69.5 ml 34.90 ml/m  RA Volume:   21.20 ml  10.65 ml/m LA Vol (A4C):   57.6 ml 28.92 ml/m LA Biplane Vol: 67.3 ml 33.80 ml/m  AORTIC VALVE                    PULMONIC VALVE AV Area (Vmax):    2.22 cm     PV Vmax:       0.96 m/s AV Area (Vmean):   2.20 cm     PV Peak grad:  3.7 mmHg AV Area (VTI):     2.26 cm AV Vmax:           115.00 cm/s AV Vmean:          82.333 cm/s AV VTI:            0.183 m AV Peak Grad:      5.3 mmHg AV Mean Grad:      2.7 mmHg LVOT Vmax:         67.10 cm/s LVOT Vmean:        47.600 cm/s LVOT VTI:          0.109 m LVOT/AV VTI ratio: 0.59  AORTA Ao Root diam: 3.70 cm Ao Asc diam:  4.10 cm MITRAL VALVE               TRICUSPID VALVE MV Area (PHT): 4.73 cm    TR Peak grad:   16.2 mmHg MV Decel Time: 161 msec    TR Vmax:        201.00 cm/s MV E velocity: 77.20 cm/s                            SHUNTS                            Systemic VTI:  0.11 m                            Systemic Diam: 2.20 cm Fransico Him MD Electronically signed by Fransico Him MD Signature Date/Time: 03/16/2022/7:18:14 PM    Final    CT ANGIO HEAD NECK W WO CM  Result Date: 03/16/2022 CLINICAL DATA:  89 year old male with falls. Small superior perirolandic white matter infarct on brain MRI yesterday. EXAM: CT ANGIOGRAPHY HEAD AND NECK TECHNIQUE: Multidetector CT imaging of the head and neck was performed using the standard protocol during bolus administration of intravenous contrast. Multiplanar CT image reconstructions and MIPs were obtained to evaluate the vascular anatomy. Carotid stenosis measurements (when applicable) are obtained utilizing NASCET  criteria,  using the distal internal carotid diameter as the denominator. RADIATION DOSE REDUCTION: This exam was performed according to the departmental dose-optimization program which includes automated exposure control, adjustment of the mA and/or kV according to patient size and/or use of iterative reconstruction technique. CONTRAST:  44m OMNIPAQUE IOHEXOL 350 MG/ML SOLN COMPARISON:  Brain MRI, intracranial MRA yesterday. Head CT yesterday. FINDINGS: CT HEAD Brain: Stable non contrast CT appearance of the brain. Chronic left MCA territory cortical encephalomalacia and advanced bilateral cerebral white matter disease. Small white matter infarct remains occult by CT. No acute intracranial hemorrhage identified. No midline shift, mass effect, or evidence of intracranial mass lesion. Calvarium and skull base: No acute osseous abnormality identified. Paranasal sinuses: Visualized paranasal sinuses and mastoids are stable and well aerated. Orbits: No acute orbit or scalp soft tissue finding. CTA NECK Skeleton: Age-appropriate cervical spine degeneration. No acute osseous abnormality identified. Upper chest: Minor atelectasis. Negative visible superior mediastinum. Other neck: No acute finding. Aortic arch: 3 vessel arch configuration. Moderate Calcified aortic atherosclerosis. Right carotid system: Brachiocephalic artery tortuosity and mild atherosclerosis. Tortuous right CCA origin. No significant stenosis. Minimal plaque at the right carotid bifurcation. No stenosis to the skull base. Left carotid system: Similar tortuosity and minimal for age atherosclerosis without stenosis. Vertebral arteries: Tortuous proximal right subclavian artery with mild plaque and no stenosis. Right vertebral artery origin remains within normal limits. The right vertebral is non dominant and diminutive but remains patent to the skull base without stenosis. Proximal left subclavian artery soft and calcified plaque without stenosis. Normal left  vertebral artery origin. Tortuous left V1 segment. Dominant left vertebral artery is patent to the skull base with no plaque or stenosis. CTA HEAD Posterior circulation: Dominant left vertebral artery supplies the basilar with mild calcified plaque near the left PICA origin. No stenosis. Patent left PICA. Diminutive right vertebral artery terminates in the right PICA. Patent basilar artery with mild irregularity but no significant stenosis. Patent SCA and PCA origins. Fetal type right PCA origin. Left PCA branches are within normal limits. The junction of the right PCA P2/P3 segment is moderately stenotic. See series 15, image 22. But otherwise right PCA branches are within normal limits. Anterior circulation: Both ICA siphons are patent. Mild for age calcified siphon atherosclerosis. Mild supraclinoid stenosis bilaterally. Normal posterior communicating artery origins. Patent carotid termini. Patent MCA and ACA origins. Diminutive or absent anterior communicating artery. Bilateral ACA branches are within normal limits. Left MCA M1 segment and bifurcation are patent without stenosis. Left MCA branches are within normal limits. Right MCA M1 segment and bifurcation are patent without stenosis. Right MCA branches are within normal limits. Venous sinuses: Early contrast timing, not well evaluated. Anatomic variants: Dominant left vertebral artery supplies the basilar, diminutive right vertebral terminates in PICA. Review of the MIP images confirms the above findings IMPRESSION: 1. Negative for large vessel occlusion. Minimal for age atherosclerosis in the neck. 2. Mild for age bilateral ICA siphon calcified plaque and supraclinoid stenosis. Moderate stenosis of the Right PCA P2/P3 junction. 3.  Stable non contrast CT appearance of the brain. 4.  Aortic Atherosclerosis (ICD10-I70.0). Electronically Signed   By: HGenevie AnnM.D.   On: 03/16/2022 11:10   MR BRAIN WO CONTRAST  Result Date: 03/15/2022 CLINICAL DATA:   Multiple falls EXAM: MRI HEAD WITHOUT CONTRAST MRA HEAD WITHOUT CONTRAST TECHNIQUE: Multiplanar, multi-echo pulse sequences of the brain and surrounding structures were acquired without intravenous contrast. Angiographic images of the Circle of Willis were acquired using  MRA technique without intravenous contrast. COMPARISON:  03/18/2020 FINDINGS: MRI HEAD FINDINGS Brain: Small acute infarct of the superior left frontal lobe. No acute hemorrhage. No chronic microhemorrhage or siderosis. There is confluent hyperintense T2-weighted signal within the white matter. There is advanced atrophy. Old left frontal infarct. The midline structures are normal. Vascular: Normal flow voids. Skull and upper cervical spine: Normal marrow signal. Sinuses/Orbits: No acute or significant finding. Other: None. MRA HEAD FINDINGS POSTERIOR CIRCULATION: --Vertebral arteries: Right vertebral artery terminates in PICA. Normal left. --Inferior cerebellar arteries: Normal. --Basilar artery: Normal. --Superior cerebellar arteries: Normal. --Posterior cerebral arteries: Normal. ANTERIOR CIRCULATION: --Intracranial internal carotid arteries: Normal. --Anterior cerebral arteries (ACA): Normal. --Middle cerebral arteries (MCA): Normal. IMPRESSION: 1. Small acute infarct of the superior left frontal lobe. No acute hemorrhage or mass effect. 2. Advanced atrophy and chronic small vessel disease. 3. No emergent large vessel occlusion or high-grade stenosis. Electronically Signed   By: Ulyses Jarred M.D.   On: 03/15/2022 21:04   MR ANGIO HEAD WO CONTRAST  Result Date: 03/15/2022 CLINICAL DATA:  Multiple falls EXAM: MRI HEAD WITHOUT CONTRAST MRA HEAD WITHOUT CONTRAST TECHNIQUE: Multiplanar, multi-echo pulse sequences of the brain and surrounding structures were acquired without intravenous contrast. Angiographic images of the Circle of Willis were acquired using MRA technique without intravenous contrast. COMPARISON:  03/18/2020 FINDINGS: MRI HEAD  FINDINGS Brain: Small acute infarct of the superior left frontal lobe. No acute hemorrhage. No chronic microhemorrhage or siderosis. There is confluent hyperintense T2-weighted signal within the white matter. There is advanced atrophy. Old left frontal infarct. The midline structures are normal. Vascular: Normal flow voids. Skull and upper cervical spine: Normal marrow signal. Sinuses/Orbits: No acute or significant finding. Other: None. MRA HEAD FINDINGS POSTERIOR CIRCULATION: --Vertebral arteries: Right vertebral artery terminates in PICA. Normal left. --Inferior cerebellar arteries: Normal. --Basilar artery: Normal. --Superior cerebellar arteries: Normal. --Posterior cerebral arteries: Normal. ANTERIOR CIRCULATION: --Intracranial internal carotid arteries: Normal. --Anterior cerebral arteries (ACA): Normal. --Middle cerebral arteries (MCA): Normal. IMPRESSION: 1. Small acute infarct of the superior left frontal lobe. No acute hemorrhage or mass effect. 2. Advanced atrophy and chronic small vessel disease. 3. No emergent large vessel occlusion or high-grade stenosis. Electronically Signed   By: Ulyses Jarred M.D.   On: 03/15/2022 21:04   CT HEAD WO CONTRAST  Result Date: 03/15/2022 CLINICAL DATA:  Fall EXAM: CT HEAD WITHOUT CONTRAST CT CERVICAL SPINE WITHOUT CONTRAST TECHNIQUE: Multidetector CT imaging of the head and cervical spine was performed following the standard protocol without intravenous contrast. Multiplanar CT image reconstructions of the cervical spine were also generated. RADIATION DOSE REDUCTION: This exam was performed according to the departmental dose-optimization program which includes automated exposure control, adjustment of the mA and/or kV according to patient size and/or use of iterative reconstruction technique. COMPARISON:  CT Head 07/12/21 FINDINGS: CT HEAD FINDINGS Brain: Redemonstrated extensive periventricular white matter hypodensity with likely chronic infarcts in the centrum  semiovale on the left and the posterior left frontal lobe. No hemorrhage. No extra-axial fluid collection. Unchanged size and shape of the ventricular system. Vascular: No hyperdense vessel or unexpected calcification. Skull: Normal. Negative for fracture or focal lesion. Sinuses/Orbits: Mucosal thickening bilateral maxillary sinuses. Bilateral lens replacement. No middle ear or mastoid effusion. Other: None. CT CERVICAL SPINE FINDINGS Alignment: Normal. Skull base and vertebrae: No acute fracture. No primary bone lesion or focal pathologic process. Soft tissues and spinal canal: No prevertebral fluid or swelling. No visible canal hematoma. Disc levels:  No evidence of high-grade spinal canal  stenosis. Upper chest: Negative. Other: None IMPRESSION: 1. No acute intracranial abnormality. 2. No acute fracture or traumatic listhesis in the cervical spine. Electronically Signed   By: Marin Roberts M.D.   On: 03/15/2022 17:35   CT CERVICAL SPINE WO CONTRAST  Result Date: 03/15/2022 CLINICAL DATA:  Fall EXAM: CT HEAD WITHOUT CONTRAST CT CERVICAL SPINE WITHOUT CONTRAST TECHNIQUE: Multidetector CT imaging of the head and cervical spine was performed following the standard protocol without intravenous contrast. Multiplanar CT image reconstructions of the cervical spine were also generated. RADIATION DOSE REDUCTION: This exam was performed according to the departmental dose-optimization program which includes automated exposure control, adjustment of the mA and/or kV according to patient size and/or use of iterative reconstruction technique. COMPARISON:  CT Head 07/12/21 FINDINGS: CT HEAD FINDINGS Brain: Redemonstrated extensive periventricular white matter hypodensity with likely chronic infarcts in the centrum semiovale on the left and the posterior left frontal lobe. No hemorrhage. No extra-axial fluid collection. Unchanged size and shape of the ventricular system. Vascular: No hyperdense vessel or unexpected  calcification. Skull: Normal. Negative for fracture or focal lesion. Sinuses/Orbits: Mucosal thickening bilateral maxillary sinuses. Bilateral lens replacement. No middle ear or mastoid effusion. Other: None. CT CERVICAL SPINE FINDINGS Alignment: Normal. Skull base and vertebrae: No acute fracture. No primary bone lesion or focal pathologic process. Soft tissues and spinal canal: No prevertebral fluid or swelling. No visible canal hematoma. Disc levels:  No evidence of high-grade spinal canal stenosis. Upper chest: Negative. Other: None IMPRESSION: 1. No acute intracranial abnormality. 2. No acute fracture or traumatic listhesis in the cervical spine. Electronically Signed   By: Marin Roberts M.D.   On: 03/15/2022 17:35        Scheduled Meds:  amLODipine  5 mg Oral Daily   aspirin EC  81 mg Oral Daily   donepezil  10 mg Oral Daily   doxazosin  4 mg Oral Daily   enoxaparin (LOVENOX) injection  40 mg Subcutaneous Q24H   latanoprost  1 drop Both Eyes QHS   multivitamin  1 tablet Oral Daily   pantoprazole  40 mg Oral Daily   pindolol  10 mg Oral Daily   sertraline  50 mg Oral Daily   simvastatin  20 mg Oral QHS   Continuous Infusions:   LOS: 2 days   Sidney Ace, MD [facility]   If 7PM-7AM, please contact night-coverage  03/17/2022, 9:41 AM

## 2022-03-17 NOTE — Progress Notes (Signed)
Physical Therapy Treatment Patient Details Name: Todd Golden. MRN: [id number] DOB: February 17, 1930 Today's Date: 03/17/2022   History of Present Illness Todd Golden. is a 89 y.o. male past medical history of a prior left MCA stroke with residual aphasia, paroxysmal atrial fibrillation not on anticoagulation due to falls, hypertension, hyperlipidemia, presenting to the emergency room with complaints of increasing falls 2 days prior to presentation and on the day of presentation.  He does have a history of frequent falls-has had 18 falls last year. MRI of the brain was done that showed a punctate infarct in the superior left frontal lobe.    PT Comments    Pt in bed.  Was up with mobility tech and returned to bed around lunch.  Agrees to session.  To EOB with min a x 1.  Steady in sitting but does need some cues for safety.  Stood with min a/mod a x 1 and SBA x 1.  He is able to progress gait today into hallway 6' with RW which he does use at baseline.  Poor walker placement with cues to step up into walker and initially does well advancing RLE but upon return to room fatigue is noted and he begins to drag RLE and mod cues are needed to advance fully.  Some external rotation noted and at times seems to "prance" with RLE with exaggerated hip/knee flexion to compensate.  He opts to return to bed vs sitting in chair but does remain EOB and does standing ex with walker and 5X sit to stand x 2.  One moderate post LOB while doing ex.  Pt does deny fatigue and continues to work hard but quality of movements and safety do decline as session progresses.    Overall is progressing well.  Recommend +2 until gait improves for pt safety.  Supportive family in room for session.   Recommendations for follow up therapy are one component of a multi-disciplinary discharge planning process, led by the attending physician.  Recommendations may be updated based on patient status, additional functional criteria and  insurance authorization.  Follow Up Recommendations  Acute inpatient rehab (3hours/day)     Assistance Recommended at Discharge Frequent or constant Supervision/Assistance  Patient can return home with the following A little help with walking and/or transfers;A little help with bathing/dressing/bathroom;Assistance with cooking/housework;Assist for transportation;Help with stairs or ramp for entrance   Equipment Recommendations       Recommendations for Other Services       Precautions / Restrictions Precautions Precautions: Fall Restrictions Weight Bearing Restrictions: No     Mobility  Bed Mobility Overal bed mobility: Needs Assistance Bed Mobility: Supine to Sit     Supine to sit: Min assist, Mod assist          Transfers Overall transfer level: Needs assistance Equipment used: Rolling walker (2 wheels) Transfers: Sit to/from Stand Sit to Stand: Min assist, Mod assist                Ambulation/Gait Ambulation/Gait assistance: Min assist, +2 safety/equipment Gait Distance (Feet): 60 Feet Assistive device: Rolling walker (2 wheels) Gait Pattern/deviations: Step-through pattern, Decreased step length - right, Decreased dorsiflexion - right Gait velocity: decreased     General Gait Details: cues to advance RLE especially as he fatigues.   Stairs             Wheelchair Mobility    Modified Rankin (Stroke Patients Only)       Balance Overall balance assessment:  Needs assistance Sitting-balance support: No upper extremity supported, Feet supported Sitting balance-Leahy Scale: Fair     Standing balance support: Bilateral upper extremity supported Standing balance-Leahy Scale: Poor Standing balance comment: +2 for general safety.  limited gait distances due to fatigue and quality decreased with distance.                            Cognition Arousal/Alertness: Awake/alert Behavior During Therapy: WFL for tasks  assessed/performed Overall Cognitive Status: Within Functional Limits for tasks assessed                                          Exercises Other Exercises Other Exercises: standing ex with walker 2 x 10.  5 x sit to stand x 2 with emphasis on hand placements.    General Comments        Pertinent Vitals/Pain Pain Assessment Pain Assessment: No/denies pain    Home Living                          Prior Function            PT Goals (current goals can now be found in the care plan section) Progress towards PT goals: Progressing toward goals    Frequency    7X/week      PT Plan Current plan remains appropriate    Co-evaluation              AM-PAC PT "6 Clicks" Mobility   Outcome Measure  Help needed turning from your back to your side while in a flat bed without using bedrails?: A Little Help needed moving from lying on your back to sitting on the side of a flat bed without using bedrails?: A Little Help needed moving to and from a bed to a chair (including a wheelchair)?: A Lot Help needed standing up from a chair using your arms (e.g., wheelchair or bedside chair)?: A Lot Help needed to walk in hospital room?: A Lot Help needed climbing 3-5 steps with a railing? : A Lot 6 Click Score: 14    End of Session Equipment Utilized During Treatment: Gait belt Activity Tolerance: Patient tolerated treatment well Patient left: in bed;with call bell/phone within reach;with bed alarm set;with family/visitor present Nurse Communication: Mobility status PT Visit Diagnosis: Muscle weakness (generalized) (M62.81);Difficulty in walking, not elsewhere classified (R26.2);Hemiplegia and hemiparesis Hemiplegia - Right/Left: Right Hemiplegia - dominant/non-dominant: Dominant Hemiplegia - caused by: Cerebral infarction     Time: [id number] PT Time Calculation (min) (ACUTE ONLY): 17 min  Charges:  $Gait Training: 8-22 mins                   Chesley Noon, PTA 03/17/22, 3:28 PM

## 2022-03-17 NOTE — Progress Notes (Signed)
PROGRESS NOTE    Todd Golden.  [id number] DOB: 1930/08/26 DOA: 03/15/2022 PCP: Crecencio Mc, MD    Brief Narrative:  89 y.o. Caucasian male with medical history significant for GERD, hypertension, dyslipidemia, nephrolithiasis and paroxysmal atrial fibrillation as well as CVA, who presented to the ER with acute onset of recurrent falls with 2 falls yesterday and 2 falls during the day as well as 1 in the ER.  His family noted that he was dragging his right lower extremity.  The patient denies any headache or dizziness or blurred vision.  He denies any paresthesias or right upper extremity weakness.  No fever or chills.  No nausea or vomiting or abdominal pain.  He denies any urinary or stool incontinence.  He has expressive aphasia from previous stroke and believes that it is slightly worse.  No chest pain or palpitations.  No cough or wheezing or hemoptysis.  No other bleeding diathesis.    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  Some residual deficit.  Remainder of CVA workup unrevealing.  No large vessel occlusion on CT angio. 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  Needs CIR.  Workup in progress.   Level of care: Telemetry Medical  Consultants:  Neurology  Procedures:  None  Antimicrobials: None   Subjective: Seen and examined peer resting bed.  No visible distress.  Family concerned about slight alterations in speech pattern and mentation  Objective: Vitals:   03/16/22 2031 03/17/22 0047 03/17/22 0050 03/17/22 0908  BP:  (!) 144/93  (!) 130/94  Pulse:  (!) 43 97 81  Resp: '18 19  18  '$ Temp:  98.7 F (37.1 C)  98.6 F (37 C)  TempSrc:      SpO2:  94%  98%  Weight:      Height:        Intake/Output Summary (Last 24 hours) at 03/17/2022 1005 Last data filed at 03/16/2022 1644 Gross per 24 hour  Intake 513.91 ml  Output 700 ml  Net -186.09 ml   Filed Weights   03/16/22 0200  Weight: 79.3 kg    Examination:  General exam: Appears calm and comfortable  Respiratory system: Clear to auscultation. Respiratory effort normal. Cardiovascular system: S1-S2, regular rate, irregular rhythm, no murmurs Gastrointestinal system: Soft, NT/ND, normal bowel sounds Central nervous system: Alert to x 2, no focal deficits Extremities: Symmetric 5 x 5 power. Skin: No rashes, lesions or ulcers Psychiatry: Judgement and insight appear normal. Mood & affect  flattened.     Data Reviewed: I have personally reviewed following labs and imaging studies  CBC: Recent Labs  Lab 03/15/22 1610 03/16/22 0448  WBC 7.6 7.2  NEUTROABS 4.3  --   HGB 13.9 13.7  HCT 41.9 41.6  MCV 91.7 91.8  PLT 244 [id number]   Basic Metabolic Panel: Recent Labs  Lab 03/15/22 1610 03/16/22 0448  NA 136 138  K 3.9 3.5  CL 99 101  CO2 26 27  GLUCOSE 101* 104*  BUN 20 18   CREATININE 1.22 1.15  CALCIUM 9.3 9.1   GFR: Estimated Creatinine Clearance: 46.4 mL/min (by C-G formula based on SCr of 1.15 mg/dL). Liver Function Tests: Recent Labs  Lab 03/15/22 1610  AST 31  ALT 25  ALKPHOS 47  BILITOT 0.8  PROT 7.1  ALBUMIN 4.0   No results for input(s): "LIPASE", "AMYLASE" in the last 168 hours. No results for input(s): "AMMONIA" in the last 168 hours. Coagulation Profile: Recent Labs  Lab 03/15/22 1610  INR 1.1   Cardiac Enzymes: No results for input(s): "CKTOTAL", "CKMB", "CKMBINDEX", "TROPONINI" in the last 168 hours. BNP (last 3 results) No results for input(s): "PROBNP" in the last 8760 hours. HbA1C: Recent Labs    03/16/22 0445  HGBA1C 5.6   CBG: No results for input(s): "GLUCAP" in the last 168 hours. Lipid Profile: Recent Labs    03/16/22 0448  CHOL 165  HDL 50  LDLCALC 93  TRIG 109  CHOLHDL 3.3   Thyroid Function Tests: No results for input(s): "TSH", "T4TOTAL", "FREET4", "T3FREE", "THYROIDAB" in the last 72 hours. Anemia Panel: No results for input(s): "VITAMINB12", "FOLATE", "FERRITIN", "TIBC", "IRON", "RETICCTPCT" in the last 72 hours. Sepsis Labs: No results for input(s): "PROCALCITON", "LATICACIDVEN" in the last 168 hours.  No results found for this or any previous visit (from the past 240 hour(s)).       Radiology Studies: ECHOCARDIOGRAM COMPLETE BUBBLE STUDY  Result Date: 03/16/2022    ECHOCARDIOGRAM REPORT   Patient Name:   Todd Golden. Date of Exam: 03/16/2022 Medical Rec #:  [id number]            Height:       71.0 in Accession #:    [id number]           Weight:       174.8 lb Date of Birth:  12-19-1930            BSA:          1.991 m Patient Age:    71 years             BP:           136/94 mmHg Patient Gender: M                    HR:           96 bpm. Exam Location:  [facility] Procedure: 2D Echo, Color Doppler, Cardiac Doppler and Saline Contrast Bubble            Study Indications:     Stroke 434.91   History:         Patient has no prior history of Echocardiogram examinations.                  Stroke; Risk Factors:Dyslipidemia and Hypertension.  Sonographer:     . Thornton-Maynard Referring Phys:  [id number] A MANSY Diagnosing Phys: Fransico Him MD IMPRESSIONS  1. Left ventricular ejection  fraction, by estimation, is 40 to 45%. The left ventricle has mildly decreased function. The left ventricle demonstrates global hypokinesis. Left ventricular diastolic function could not be evaluated.  2. Right ventricular systolic function is normal. The right ventricular size is normal. There is normal pulmonary artery systolic pressure. The estimated right ventricular systolic pressure is Q000111Q mmHg.  3. The mitral valve is normal in structure. Trivial mitral valve regurgitation. No evidence of mitral stenosis.  4. The aortic valve is normal in structure. Aortic valve regurgitation is not visualized. Aortic valve sclerosis/calcification is present, without any evidence of aortic stenosis. Aortic valve area, by VTI measures 2.26 cm. Aortic valve mean gradient measures 2.7 mmHg. Aortic valve Vmax measures 1.15 m/s.  5. Aortic dilatation noted. There is mild dilatation of the ascending aorta, measuring 41 mm.  6. The inferior vena cava is normal in size with greater than 50% respiratory variability, suggesting right atrial pressure of 3 mmHg. Conclusion(s)/Recommendation(s): No intracardiac source of embolism detected on this transthoracic study. Consider a transesophageal echocardiogram to exclude cardiac source of embolism if clinically indicated. FINDINGS  Left Ventricle: Left ventricular ejection fraction, by estimation, is 40 to 45%. The left ventricle has mildly decreased function. The left ventricle demonstrates global hypokinesis. The left ventricular internal cavity size was normal in size. There is  no left ventricular hypertrophy. Left ventricular diastolic function could not be evaluated. Right Ventricle: The  right ventricular size is normal. No increase in right ventricular wall thickness. Right ventricular systolic function is normal. There is normal pulmonary artery systolic pressure. The tricuspid regurgitant velocity is 2.01 m/s, and  with an assumed right atrial pressure of 3 mmHg, the estimated right ventricular systolic pressure is Q000111Q mmHg. Left Atrium: Left atrial size was normal in size. Right Atrium: Right atrial size was normal in size. Pericardium: There is no evidence of pericardial effusion. Mitral Valve: The mitral valve is normal in structure. Mild mitral annular calcification. Trivial mitral valve regurgitation. No evidence of mitral valve stenosis. Tricuspid Valve: The tricuspid valve is normal in structure. Tricuspid valve regurgitation is trivial. No evidence of tricuspid stenosis. Aortic Valve: The aortic valve is normal in structure. Aortic valve regurgitation is not visualized. Aortic valve sclerosis/calcification is present, without any evidence of aortic stenosis. Aortic valve mean gradient measures 2.7 mmHg. Aortic valve peak  gradient measures 5.3 mmHg. Aortic valve area, by VTI measures 2.26 cm. Pulmonic Valve: The pulmonic valve was normal in structure. Pulmonic valve regurgitation is not visualized. No evidence of pulmonic stenosis. Aorta: Aortic dilatation noted. There is mild dilatation of the ascending aorta, measuring 41 mm. Venous: The inferior vena cava is normal in size with greater than 50% respiratory variability, suggesting right atrial pressure of 3 mmHg. IAS/Shunts: No atrial level shunt detected by color flow Doppler. Agitated saline contrast was given intravenously to evaluate for intracardiac shunting.  LEFT VENTRICLE PLAX 2D LVIDd:         5.30 cm     Diastology LVIDs:         3.60 cm     LV e' medial:    7.51 cm/s LV PW:         1.10 cm     LV E/e' medial:  10.3 LV IVS:        0.90 cm     LV e' lateral:   12.10 cm/s LVOT diam:     2.20 cm     LV E/e' lateral: 6.4 LV SV:  41 LV SV Index:   21 LVOT Area:     3.80 cm  LV Volumes (MOD) LV vol d, MOD A2C: 85.3 ml LV vol d, MOD A4C: 61.8 ml LV vol s, MOD A2C: 52.5 ml LV vol s, MOD A4C: 31.9 ml LV SV MOD A2C:     32.8 ml LV SV MOD A4C:     61.8 ml LV SV MOD BP:      33.5 ml RIGHT VENTRICLE RV S prime:     12.10 cm/s TAPSE (M-mode): 2.1 cm LEFT ATRIUM             Index        RIGHT ATRIUM           Index LA diam:        4.40 cm 2.21 cm/m   RA Area:     11.30 cm LA Vol (A2C):   69.5 ml 34.90 ml/m  RA Volume:   21.20 ml  10.65 ml/m LA Vol (A4C):   57.6 ml 28.92 ml/m LA Biplane Vol: 67.3 ml 33.80 ml/m  AORTIC VALVE                    PULMONIC VALVE AV Area (Vmax):    2.22 cm     PV Vmax:       0.96 m/s AV Area (Vmean):   2.20 cm     PV Peak grad:  3.7 mmHg AV Area (VTI):     2.26 cm AV Vmax:           115.00 cm/s AV Vmean:          82.333 cm/s AV VTI:            0.183 m AV Peak Grad:      5.3 mmHg AV Mean Grad:      2.7 mmHg LVOT Vmax:         67.10 cm/s LVOT Vmean:        47.600 cm/s LVOT VTI:          0.109 m LVOT/AV VTI ratio: 0.59  AORTA Ao Root diam: 3.70 cm Ao Asc diam:  4.10 cm MITRAL VALVE               TRICUSPID VALVE MV Area (PHT): 4.73 cm    TR Peak grad:   16.2 mmHg MV Decel Time: 161 msec    TR Vmax:        201.00 cm/s MV E velocity: 77.20 cm/s                            SHUNTS                            Systemic VTI:  0.11 m                            Systemic Diam: 2.20 cm Fransico Him MD Electronically signed by Fransico Him MD Signature Date/Time: 03/16/2022/7:18:14 PM    Final    CT ANGIO HEAD NECK W WO CM  Result Date: 03/16/2022 CLINICAL DATA:  89 year old male with falls. Small superior perirolandic white matter infarct on brain MRI yesterday. EXAM: CT ANGIOGRAPHY HEAD AND NECK TECHNIQUE: Multidetector CT imaging of the head and neck was performed using the standard protocol during bolus administration of intravenous contrast. Multiplanar CT image reconstructions  and MIPs were obtained to evaluate the  vascular anatomy. Carotid stenosis measurements (when applicable) are obtained utilizing NASCET criteria, using the distal internal carotid diameter as the denominator. RADIATION DOSE REDUCTION: This exam was performed according to the departmental dose-optimization program which includes automated exposure control, adjustment of the mA and/or kV according to patient size and/or use of iterative reconstruction technique. CONTRAST:  57m OMNIPAQUE IOHEXOL 350 MG/ML SOLN COMPARISON:  Brain MRI, intracranial MRA yesterday. Head CT yesterday. FINDINGS: CT HEAD Brain: Stable non contrast CT appearance of the brain. Chronic left MCA territory cortical encephalomalacia and advanced bilateral cerebral white matter disease. Small white matter infarct remains occult by CT. No acute intracranial hemorrhage identified. No midline shift, mass effect, or evidence of intracranial mass lesion. Calvarium and skull base: No acute osseous abnormality identified. Paranasal sinuses: Visualized paranasal sinuses and mastoids are stable and well aerated. Orbits: No acute orbit or scalp soft tissue finding. CTA NECK Skeleton: Age-appropriate cervical spine degeneration. No acute osseous abnormality identified. Upper chest: Minor atelectasis. Negative visible superior mediastinum. Other neck: No acute finding. Aortic arch: 3 vessel arch configuration. Moderate Calcified aortic atherosclerosis. Right carotid system: Brachiocephalic artery tortuosity and mild atherosclerosis. Tortuous right CCA origin. No significant stenosis. Minimal plaque at the right carotid bifurcation. No stenosis to the skull base. Left carotid system: Similar tortuosity and minimal for age atherosclerosis without stenosis. Vertebral arteries: Tortuous proximal right subclavian artery with mild plaque and no stenosis. Right vertebral artery origin remains within normal limits. The right vertebral is non dominant and diminutive but remains patent to the skull base  without stenosis. Proximal left subclavian artery soft and calcified plaque without stenosis. Normal left vertebral artery origin. Tortuous left V1 segment. Dominant left vertebral artery is patent to the skull base with no plaque or stenosis. CTA HEAD Posterior circulation: Dominant left vertebral artery supplies the basilar with mild calcified plaque near the left PICA origin. No stenosis. Patent left PICA. Diminutive right vertebral artery terminates in the right PICA. Patent basilar artery with mild irregularity but no significant stenosis. Patent SCA and PCA origins. Fetal type right PCA origin. Left PCA branches are within normal limits. The junction of the right PCA P2/P3 segment is moderately stenotic. See series 15, image 22. But otherwise right PCA branches are within normal limits. Anterior circulation: Both ICA siphons are patent. Mild for age calcified siphon atherosclerosis. Mild supraclinoid stenosis bilaterally. Normal posterior communicating artery origins. Patent carotid termini. Patent MCA and ACA origins. Diminutive or absent anterior communicating artery. Bilateral ACA branches are within normal limits. Left MCA M1 segment and bifurcation are patent without stenosis. Left MCA branches are within normal limits. Right MCA M1 segment and bifurcation are patent without stenosis. Right MCA branches are within normal limits. Venous sinuses: Early contrast timing, not well evaluated. Anatomic variants: Dominant left vertebral artery supplies the basilar, diminutive right vertebral terminates in PICA. Review of the MIP images confirms the above findings IMPRESSION: 1. Negative for large vessel occlusion. Minimal for age atherosclerosis in the neck. 2. Mild for age bilateral ICA siphon calcified plaque and supraclinoid stenosis. Moderate stenosis of the Right PCA P2/P3 junction. 3.  Stable non contrast CT appearance of the brain. 4.  Aortic Atherosclerosis (ICD10-I70.0). Electronically Signed   By: HGenevie AnnM.D.   On: 03/16/2022 11:10   MR BRAIN WO CONTRAST  Result Date: 03/15/2022 CLINICAL DATA:  Multiple falls EXAM: MRI HEAD WITHOUT CONTRAST MRA HEAD WITHOUT CONTRAST TECHNIQUE: Multiplanar, multi-echo pulse sequences of the brain  and surrounding structures were acquired without intravenous contrast. Angiographic images of the Circle of Willis were acquired using MRA technique without intravenous contrast. COMPARISON:  03/18/2020 FINDINGS: MRI HEAD FINDINGS Brain: Small acute infarct of the superior left frontal lobe. No acute hemorrhage. No chronic microhemorrhage or siderosis. There is confluent hyperintense T2-weighted signal within the white matter. There is advanced atrophy. Old left frontal infarct. The midline structures are normal. Vascular: Normal flow voids. Skull and upper cervical spine: Normal marrow signal. Sinuses/Orbits: No acute or significant finding. Other: None. MRA HEAD FINDINGS POSTERIOR CIRCULATION: --Vertebral arteries: Right vertebral artery terminates in PICA. Normal left. --Inferior cerebellar arteries: Normal. --Basilar artery: Normal. --Superior cerebellar arteries: Normal. --Posterior cerebral arteries: Normal. ANTERIOR CIRCULATION: --Intracranial internal carotid arteries: Normal. --Anterior cerebral arteries (ACA): Normal. --Middle cerebral arteries (MCA): Normal. IMPRESSION: 1. Small acute infarct of the superior left frontal lobe. No acute hemorrhage or mass effect. 2. Advanced atrophy and chronic small vessel disease. 3. No emergent large vessel occlusion or high-grade stenosis. Electronically Signed   By: Ulyses Jarred M.D.   On: 03/15/2022 21:04   MR ANGIO HEAD WO CONTRAST  Result Date: 03/15/2022 CLINICAL DATA:  Multiple falls EXAM: MRI HEAD WITHOUT CONTRAST MRA HEAD WITHOUT CONTRAST TECHNIQUE: Multiplanar, multi-echo pulse sequences of the brain and surrounding structures were acquired without intravenous contrast. Angiographic images of the Circle of Willis were  acquired using MRA technique without intravenous contrast. COMPARISON:  03/18/2020 FINDINGS: MRI HEAD FINDINGS Brain: Small acute infarct of the superior left frontal lobe. No acute hemorrhage. No chronic microhemorrhage or siderosis. There is confluent hyperintense T2-weighted signal within the white matter. There is advanced atrophy. Old left frontal infarct. The midline structures are normal. Vascular: Normal flow voids. Skull and upper cervical spine: Normal marrow signal. Sinuses/Orbits: No acute or significant finding. Other: None. MRA HEAD FINDINGS POSTERIOR CIRCULATION: --Vertebral arteries: Right vertebral artery terminates in PICA. Normal left. --Inferior cerebellar arteries: Normal. --Basilar artery: Normal. --Superior cerebellar arteries: Normal. --Posterior cerebral arteries: Normal. ANTERIOR CIRCULATION: --Intracranial internal carotid arteries: Normal. --Anterior cerebral arteries (ACA): Normal. --Middle cerebral arteries (MCA): Normal. IMPRESSION: 1. Small acute infarct of the superior left frontal lobe. No acute hemorrhage or mass effect. 2. Advanced atrophy and chronic small vessel disease. 3. No emergent large vessel occlusion or high-grade stenosis. Electronically Signed   By: Ulyses Jarred M.D.   On: 03/15/2022 21:04   CT HEAD WO CONTRAST  Result Date: 03/15/2022 CLINICAL DATA:  Fall EXAM: CT HEAD WITHOUT CONTRAST CT CERVICAL SPINE WITHOUT CONTRAST TECHNIQUE: Multidetector CT imaging of the head and cervical spine was performed following the standard protocol without intravenous contrast. Multiplanar CT image reconstructions of the cervical spine were also generated. RADIATION DOSE REDUCTION: This exam was performed according to the departmental dose-optimization program which includes automated exposure control, adjustment of the mA and/or kV according to patient size and/or use of iterative reconstruction technique. COMPARISON:  CT Head 07/12/21 FINDINGS: CT HEAD FINDINGS Brain:  Redemonstrated extensive periventricular white matter hypodensity with likely chronic infarcts in the centrum semiovale on the left and the posterior left frontal lobe. No hemorrhage. No extra-axial fluid collection. Unchanged size and shape of the ventricular system. Vascular: No hyperdense vessel or unexpected calcification. Skull: Normal. Negative for fracture or focal lesion. Sinuses/Orbits: Mucosal thickening bilateral maxillary sinuses. Bilateral lens replacement. No middle ear or mastoid effusion. Other: None. CT CERVICAL SPINE FINDINGS Alignment: Normal. Skull base and vertebrae: No acute fracture. No primary bone lesion or focal pathologic process. Soft tissues and spinal canal:  No prevertebral fluid or swelling. No visible canal hematoma. Disc levels:  No evidence of high-grade spinal canal stenosis. Upper chest: Negative. Other: None IMPRESSION: 1. No acute intracranial abnormality. 2. No acute fracture or traumatic listhesis in the cervical spine. Electronically Signed   By: Marin Roberts M.D.   On: 03/15/2022 17:35   CT CERVICAL SPINE WO CONTRAST  Result Date: 03/15/2022 CLINICAL DATA:  Fall EXAM: CT HEAD WITHOUT CONTRAST CT CERVICAL SPINE WITHOUT CONTRAST TECHNIQUE: Multidetector CT imaging of the head and cervical spine was performed following the standard protocol without intravenous contrast. Multiplanar CT image reconstructions of the cervical spine were also generated. RADIATION DOSE REDUCTION: This exam was performed according to the departmental dose-optimization program which includes automated exposure control, adjustment of the mA and/or kV according to patient size and/or use of iterative reconstruction technique. COMPARISON:  CT Head 07/12/21 FINDINGS: CT HEAD FINDINGS Brain: Redemonstrated extensive periventricular white matter hypodensity with likely chronic infarcts in the centrum semiovale on the left and the posterior left frontal lobe. No hemorrhage. No extra-axial fluid  collection. Unchanged size and shape of the ventricular system. Vascular: No hyperdense vessel or unexpected calcification. Skull: Normal. Negative for fracture or focal lesion. Sinuses/Orbits: Mucosal thickening bilateral maxillary sinuses. Bilateral lens replacement. No middle ear or mastoid effusion. Other: None. CT CERVICAL SPINE FINDINGS Alignment: Normal. Skull base and vertebrae: No acute fracture. No primary bone lesion or focal pathologic process. Soft tissues and spinal canal: No prevertebral fluid or swelling. No visible canal hematoma. Disc levels:  No evidence of high-grade spinal canal stenosis. Upper chest: Negative. Other: None IMPRESSION: 1. No acute intracranial abnormality. 2. No acute fracture or traumatic listhesis in the cervical spine. Electronically Signed   By: Marin Roberts M.D.   On: 03/15/2022 17:35        Scheduled Meds:  amLODipine  5 mg Oral Daily   aspirin EC  81 mg Oral Daily   donepezil  10 mg Oral Daily   doxazosin  4 mg Oral Daily   enoxaparin (LOVENOX) injection  40 mg Subcutaneous Q24H   latanoprost  1 drop Both Eyes QHS   multivitamin  1 tablet Oral Daily   pantoprazole  40 mg Oral Daily   pindolol  10 mg Oral Daily   rosuvastatin  20 mg Oral Daily   sertraline  50 mg Oral Daily   Continuous Infusions:   LOS: 2 days   Sidney Ace, MD [facility]   If 7PM-7AM, please contact night-coverage  03/17/2022, 10:05 AM

## 2022-03-17 NOTE — Progress Notes (Signed)
[facility] IP rehab admissions - Patient screened for potential acute inpatient rehab admission.  Per protocol will place an order for a full consult.  Call me for questions.  [phone]

## 2022-03-17 NOTE — Progress Notes (Signed)
Neurology Progress Note   S:// Seen and examined.  Wife reports that he feels better but his aphasia is still the same as baseline.   O:// Current vital signs: BP (!) 130/94 (BP Location: Left Arm)   Pulse 81   Temp 98.6 F (37 C)   Resp 18   Ht '5\' 11"'$  (1.803 m)   Wt 79.3 kg   SpO2 98%   BMI 24.38 kg/m  Vital signs in last 24 hours: Temp:  [97.5 F (36.4 C)-98.8 F (37.1 C)] 98.6 F (37 C) (02/25 0908) Pulse Rate:  [43-97] 81 (02/25 0908) Resp:  [15-20] 18 (02/25 0908) BP: (122-144)/(80-97) 130/94 (02/25 0908) SpO2:  [94 %-100 %] 98 % (02/25 0908) Neurological examination He is awake alert oriented to self He had some trouble with longer sentences but he was able to name simple objects although inconsistently today. He was able to repeat some simple sentences although he has mild dysarthria just like yesterday. He was able to follow simple commands but not multistep commands Cranial nerves II to XII intact Motor examination with no drift Sensation intact Coordination difficult to assess but no gross dysmetria noted on reach for objects.  General: Awake alert in no distress HEENT: Normocephalic atraumatic Lungs: Clear Cardiovascular: Regular rhythm Abdomen nondistended nontender  Medications  Current Facility-Administered Medications:    acetaminophen (TYLENOL) tablet 650 mg, 650 mg, Oral, Q6H PRN **OR** acetaminophen (TYLENOL) suppository 650 mg, 650 mg, Rectal, Q6H PRN, Mansy, Jan A, MD   amLODipine (NORVASC) tablet 5 mg, 5 mg, Oral, Daily, Mansy, Jan A, MD, 5 mg at 03/16/22 [id number]   aspirin EC tablet 81 mg, 81 mg, Oral, Daily, Mansy, Jan A, MD, 81 mg at 03/16/22 [id number]   benzonatate (TESSALON) capsule 100 mg, 100 mg, Oral, TID PRN, Mansy, Jan A, MD   donepezil (ARICEPT) tablet 10 mg, 10 mg, Oral, Daily, Mansy, Jan A, MD, 10 mg at 03/16/22 [id number]   doxazosin (CARDURA) tablet 4 mg, 4 mg, Oral, Daily, Sreenath, Sudheer B, MD   enoxaparin (LOVENOX) injection 40 mg, 40 mg,  Subcutaneous, Q24H, Mansy, Jan A, MD, 40 mg at 03/16/22 [id number]   EPINEPHrine (EPI-PEN) injection 0.3 mg, 0.3 mg, Intramuscular, PRN, Mansy, Jan A, MD   latanoprost (XALATAN) 0.005 % ophthalmic solution 1 drop, 1 drop, Both Eyes, QHS, Mansy, Jan A, MD, 1 drop at 03/16/22 2123   magnesium hydroxide (MILK OF MAGNESIA) suspension 30 mL, 30 mL, Oral, Daily PRN, Mansy, Jan A, MD   multivitamin (PROSIGHT) tablet 1 tablet, 1 tablet, Oral, Daily, Mansy, Jan A, MD, 1 tablet at 03/16/22 0920   ondansetron (ZOFRAN) tablet 4 mg, 4 mg, Oral, Q6H PRN **OR** ondansetron (ZOFRAN) injection 4 mg, 4 mg, Intravenous, Q6H PRN, Mansy, Jan A, MD   pantoprazole (PROTONIX) EC tablet 40 mg, 40 mg, Oral, Daily, Mansy, Jan A, MD, 40 mg at 03/16/22 0919   pindolol (VISKEN) tablet 10 mg, 10 mg, Oral, Daily, Mansy, Jan A, MD, 10 mg at 03/16/22 [id number]   sertraline (ZOLOFT) tablet 50 mg, 50 mg, Oral, Daily, Mansy, Jan A, MD, 50 mg at 03/16/22 0919   simvastatin (ZOCOR) tablet 20 mg, 20 mg, Oral, QHS, Sreenath, Sudheer B, MD   traZODone (DESYREL) tablet 25 mg, 25 mg, Oral, QHS PRN, Mansy, Jan A, MD Labs CBC    Component Value Date/Time   WBC 7.2 03/16/2022 0448   RBC 4.53 03/16/2022 0448   HGB 13.7 03/16/2022 0448   HGB 13.0 07/04/2016 1001   HCT 41.6  03/16/2022 0448   HCT 38.9 07/04/2016 1001   PLT 218 03/16/2022 0448   PLT 229 07/04/2016 1001   MCV 91.8 03/16/2022 0448   MCV 93 07/04/2016 1001   MCV 94 12/04/2013 0549   MCH 30.2 03/16/2022 0448   MCHC 32.9 03/16/2022 0448   RDW 13.5 03/16/2022 0448   RDW 13.7 07/04/2016 1001   RDW 12.9 12/04/2013 0549   LYMPHSABS 2.0 03/15/2022 1610   LYMPHSABS 2.1 12/04/2013 0549   MONOABS 1.1 (H) 03/15/2022 1610   MONOABS 0.9 12/04/2013 0549   EOSABS 0.2 03/15/2022 1610   EOSABS 0.2 12/04/2013 0549   BASOSABS 0.0 03/15/2022 1610   BASOSABS 0.0 12/04/2013 0549    CMP     Component Value Date/Time   NA 138 03/16/2022 0448   NA 140 07/04/2016 1001   NA 144 12/04/2013 0549    K 3.5 03/16/2022 0448   K 3.5 12/04/2013 0549   CL 101 03/16/2022 0448   CL 107 12/04/2013 0549   CO2 27 03/16/2022 0448   CO2 30 12/04/2013 0549   GLUCOSE 104 (H) 03/16/2022 0448   GLUCOSE 93 12/04/2013 0549   BUN 18 03/16/2022 0448   BUN 20 07/04/2016 1001   BUN 16 12/04/2013 0549   CREATININE 1.15 03/16/2022 0448   CREATININE 1.11 12/04/2013 0549   CALCIUM 9.1 03/16/2022 0448   CALCIUM 8.2 (L) 12/04/2013 0549   PROT 7.1 03/15/2022 1610   PROT 7.2 07/04/2016 1001   PROT 7.2 12/03/2013 0913   ALBUMIN 4.0 03/15/2022 1610   ALBUMIN 4.4 07/04/2016 1001   ALBUMIN 3.5 12/03/2013 0913   AST 31 03/15/2022 1610   AST 30 12/03/2013 0913   ALT 25 03/15/2022 1610   ALT 29 12/03/2013 0913   ALKPHOS 47 03/15/2022 1610   ALKPHOS 60 12/03/2013 0913   BILITOT 0.8 03/15/2022 1610   BILITOT 0.6 07/04/2016 1001   BILITOT 0.6 12/03/2013 0913   GFRNONAA >60 03/16/2022 0448   GFRNONAA >60 12/04/2013 0549   GFRAA >60 07/29/2017 0812   GFRAA >60 12/04/2013 0549     Lipid Panel     Component Value Date/Time   CHOL 165 03/16/2022 0448   CHOL 133 12/18/2020 0819   CHOL 168 12/04/2013 0549   TRIG 109 03/16/2022 0448   TRIG 96 12/04/2013 0549   HDL 50 03/16/2022 0448   HDL 55 12/18/2020 0819   HDL 44 12/04/2013 0549   CHOLHDL 3.3 03/16/2022 0448   VLDL 22 03/16/2022 0448   VLDL 19 12/04/2013 0549   LDLCALC 93 03/16/2022 0448   LDLCALC 60 12/18/2020 0819   LDLCALC 105 (H) 12/04/2013 0549   LDL 92 A1c 5.6 2D echo Echo: IMPRESSIONS  1. Left ventricular ejection fraction, by estimation, is 40 to 45%. The left ventricle has mildly decreased function. The left ventricle demonstrates global hypokinesis. Left ventricular diastolic function could not be evaluated.  2. Right ventricular systolic function is normal. The right ventricular size is normal. There is normal pulmonary artery systolic pressure. The estimated right ventricular systolic pressure is [id number] mmHg.  3. The mitral  valve is normal in structure. Trivial mitral valve regurgitation. No evidence of mitral stenosis.  4. The aortic valve is normal in structure. Aortic valve regurgitation is not visualized. Aortic valve sclerosis/calcification is present, without any evidence of aortic stenosis. Aortic valve area, by VTI measures 2.26 cm. Aortic valve mean gradient measures 2.7 mmHg. Aortic valve Vmax measures 1.15 m/s.  5. Aortic dilatation noted. There is mild dilatation of  the ascending aorta, measuring 41 mm.  6. The inferior vena cava is normal in size with greater than 50% respiratory variability, suggesting right atrial pressure of 3 mmHg.  Conclusion(s)/Recommendation(s): No intracardiac source of embolism detected on this transthoracic study. Consider a transesophageal echocardiogram to exclude cardiac source of embolism if clinically Indicated  Imaging I have reviewed images in epic and the results pertinent to this consultation are: MRI brain without contrast-shows punctate acute left frontal infarct.  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 His right leg weakness was likely due to the acute punctate left frontal stroke.  Etiology is likely cardioembolic in the setting of A-fib not on anticoagulation. His outpatient physicians have discussed risks and benefits of anticoagulation and decided not to anticoagulate him due to frequent falls.  I have discussed this further but the family remains concerned about falls and bleeds. For now we will continue him on aspirin only with discussions of anticoagulation to be again addressed on outpatient visits  Impression: Acute ischemic  stroke-likely cardioembolic in the setting of no anticoagulation with history of atrial fibrillation  Recommendations: Echocardiogram reveals diminished ejection fraction-I do not have a comparator.  Management per primary team.  Likely outpatient follow-up given advanced age, severe disability and not much benefit for inpatient ischemic evaluation. His LDL is above goal.  Goal is less than 70.  Given his advanced age, I would start him on Simvastatin 20 for now Continue aspirin for stroke prevention. I would recommend discussion on anticoagulation which might be beneficial for prevention of ischemic strokes but family is concerned that he has frequent falls and has a high risk bleed on anticoagulants and are reluctant for anticoagulation for right now.  We did discuss that there are risks of ischemic stroke by not being on anticoagulation but hemorrhagic stroke or ICH can happen with falls and increased blood pressures while on anticoagulation.  Family still remains undecided on whether they want to start anticoagulation yet or not.  This will have to be deferred as a continuing discussion during outpatient follow-ups. PT OT speech therapy  Outpatient follow-up with neurology in 6 to 8 weeks after discharge with special consideration and discussions regarding anticoagulation, which is indicated for atrial fibrillation but concerning his case for falls and bleeds which is prevented the family from making the decision to start anticoagulation.  Was discussed with Dr. Priscella Mann.  -- Amie Portland, MD Neurologist [facility] Pager: [phone]

## 2022-03-17 NOTE — Progress Notes (Signed)
Mobility Specialist - Progress Note   03/17/22 0836  Mobility  Activity Transferred from bed to chair  Level of Assistance Minimal assist, patient does 75% or more  Assistive Device None  Distance Ambulated (ft) 0 ft  Activity Response Tolerated well  Mobility Referral Yes  $Mobility charge 1 Mobility   Pt semi-supine in bed on RA upon arrival. Pt completes bed mobility, STS, and transfers Min A. Pt needs verbal cues for safety awareness throughout. Pt left in recliner with needs in reach and chair alarm set.   Gretchen Short  Mobility Specialist  03/17/22 8:38 AM

## 2022-03-18 DIAGNOSIS — I639 Cerebral infarction, unspecified: Secondary | ICD-10-CM | POA: Diagnosis not present

## 2022-03-18 NOTE — Plan of Care (Signed)
  Problem: Education: Goal: Knowledge of disease or condition will improve Outcome: Progressing Goal: Knowledge of secondary prevention will improve (MUST DOCUMENT ALL) Outcome: Progressing Goal: Knowledge of patient specific risk factors will improve [name] N/A or DELETE if not current risk factor) Outcome: Progressing   Problem: Health Behavior/Discharge Planning: Goal: Goals will be collaboratively established with patient/family Outcome: Progressing   Problem: Self-Care: Goal: Ability to participate in self-care as condition permits will improve Outcome: Progressing Goal: Ability to communicate needs accurately will improve Outcome: Progressing   Problem: Nutrition: Goal: Risk of aspiration will decrease Outcome: Progressing   Problem: Clinical Measurements: Goal: Will remain free from infection Outcome: Progressing   Problem: Coping: Goal: Level of anxiety will decrease Outcome: Progressing   Problem: Pain Managment: Goal: General experience of comfort will improve Outcome: Progressing   Problem: Safety: Goal: Ability to remain free from injury will improve Outcome: Progressing

## 2022-03-18 NOTE — Progress Notes (Signed)
Physical Therapy Treatment Patient Details Name: Todd Golden. MRN: [id number] DOB: [birth year] Today's Date: 03/18/2022   History of Present Illness Todd Golden. is a 89 y.o. male past medical history of a prior left MCA stroke with residual aphasia, paroxysmal atrial fibrillation not on anticoagulation due to falls, hypertension, hyperlipidemia, presenting to the emergency room with complaints of increasing falls 2 days prior to presentation and on the day of presentation.  He does have a history of frequent falls-has had 18 falls last year. MRI of the brain was done that showed a punctate infarct in the superior left frontal lobe.    PT Comments    Pt was sitting in recliner upon arrival. He is alert but does present with expressive aphasia. He agrees to session and remains cooperative and motivated. Pt requires assistance to safely stand to RW, ambulate around RN station and with all ADLs. Pt has poor heel strike and decreased BLE DF. Will continue to monitor for possible AFO/ sure -up bracing in future. Will defer AFO to CIR level of care. He remains CIR appropriate to maximize independence and safety with all ADLs.     Recommendations for follow up therapy are one component of a multi-disciplinary discharge planning process, led by the attending physician.  Recommendations may be updated based on patient status, additional functional criteria and insurance authorization.  Follow Up Recommendations  Acute inpatient rehab (3hours/day)     Assistance Recommended at Discharge Frequent or constant Supervision/Assistance  Patient can return home with the following A little help with walking and/or transfers;A little help with bathing/dressing/bathroom;Assistance with cooking/housework;Assist for transportation;Help with stairs or ramp for entrance   Equipment Recommendations  Other (comment) (defer to next level of care)       Precautions / Restrictions  Precautions Precautions: Fall Restrictions Weight Bearing Restrictions: No     Mobility  Bed Mobility  General bed mobility comments: In recliner pre/post session    Transfers Overall transfer level: Needs assistance Equipment used: Rolling walker (2 wheels) Transfers: Sit to/from Stand Sit to Stand: Min assist, Mod assist    General transfer comment: min-mod assist of one to stand form recliner and ambulate with RW. vcs/ tcs  for handplacement, fwd wt shift, and overall improved sequencing.    Ambulation/Gait Ambulation/Gait assistance: Min assist Gait Distance (Feet): 160 Feet Assistive device: Rolling walker (2 wheels) Gait Pattern/deviations: Step-to pattern, Step-through pattern, Decreased stance time - right, Decreased stance time - left Gait velocity: decreased  General Gait Details: pt ambulated 160 ft total with RW + min A. pt has poor heel strike on BLEs. used ace wrap to assist with DF but may benefit from an AFO/ sure up?    Balance Overall balance assessment: Needs assistance Sitting-balance support: No upper extremity supported, Feet supported Sitting balance-Leahy Scale: Fair     Standing balance support: No upper extremity supported, During functional activity Standing balance-Leahy Scale: Poor Standing balance comment: high fall risk due to balance deficits. Reliant on RW/UE support during standing synamic task       Cognition Arousal/Alertness: Awake/alert Behavior During Therapy: WFL for tasks assessed/performed Overall Cognitive Status: Within Functional Limits for tasks assessed      General Comments: pt has expressive aphasia but with increased time is able to get his point across.               Pertinent Vitals/Pain Pain Assessment Pain Assessment: No/denies pain     PT Goals (current goals can now be  found in the care plan section) Acute Rehab PT Goals Patient Stated Goal: go to rehab then home Progress towards PT goals: Progressing  toward goals    Frequency    7X/week      PT Plan Current plan remains appropriate       AM-PAC PT "6 Clicks" Mobility   Outcome Measure  Help needed turning from your back to your side while in a flat bed without using bedrails?: A Little Help needed moving from lying on your back to sitting on the side of a flat bed without using bedrails?: A Little Help needed moving to and from a bed to a chair (including a wheelchair)?: A Little Help needed standing up from a chair using your arms (e.g., wheelchair or bedside chair)?: A Little Help needed to walk in hospital room?: A Lot Help needed climbing 3-5 steps with a railing? : A Lot 6 Click Score: 16    End of Session Equipment Utilized During Treatment: Gait belt Activity Tolerance: Patient tolerated treatment well Patient left: in chair;with call bell/phone within reach;with chair alarm set;with family/visitor present Nurse Communication: Mobility status PT Visit Diagnosis: Muscle weakness (generalized) (M62.81);Difficulty in walking, not elsewhere classified (R26.2);Hemiplegia and hemiparesis Hemiplegia - Right/Left: Right Hemiplegia - dominant/non-dominant: Dominant Hemiplegia - caused by: Cerebral infarction     Time: [id number] PT Time Calculation (min) (ACUTE ONLY): 26 min  Charges:  $Gait Training: 8-22 mins $Neuromuscular Re-education: 8-22 mins                     Julaine Fusi PTA 03/18/22, 1:13 PM

## 2022-03-18 NOTE — Progress Notes (Signed)
PROGRESS NOTE    Todd Golden.  [id number] DOB: 1930/11/09 DOA: 03/15/2022 PCP: Crecencio Mc, MD    Brief Narrative:  89 y.o. Caucasian male with medical history significant for GERD, hypertension, dyslipidemia, nephrolithiasis and paroxysmal atrial fibrillation as well as CVA, who presented to the ER with acute onset of recurrent falls with 2 falls yesterday and 2 falls during the day as well as 1 in the ER.  His family noted that he was dragging his right lower extremity.  The patient denies any headache or dizziness or blurred vision.  He denies any paresthesias or right upper extremity weakness.  No fever or chills.  No nausea or vomiting or abdominal pain.  He denies any urinary or stool incontinence.  He has expressive aphasia from previous stroke and believes that it is slightly worse.  No chest pain or palpitations.  No cough or wheezing or hemoptysis.  No other bleeding diathesis.    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  Some residual deficit.  Remainder of CVA workup unrevealing.  No large vessel occlusion on CT angio. Mild systolic congestive heart failure chronic noted on echocardiogram Plan: Frequent neurochecks Continue telemetry Normotensive blood pressure goal 2 antiplatelet therapy aspirin Plavix High intensity statin PT OT speech CIR consult     Essential hypertension, benign PTA BP meds   Paroxysmal atrial fibrillation (HCC) Continue beta-blocker, aspirin, Plavix Not a candidate for systemic anticoagulation given fall risk 2/26: I had an extensive conversation with the patient's son today regarding the risk/benefit of therapeutic anticoagulation.  At this point I  do not feel comfortable starting him on therapeutic anticoagulation however this is a reasonable consideration that needs to be discussed further with primary care physician.  At this time I suggest remaining on dual antiplatelet therapy aspirin and Plavix and discharged him to CIR.  Patient can then follow-up with his primary care physician to discuss risk/benefit of systemic anticoagulation in this patient with high fall risk.   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  Needs CIR.  Workup in progress.  Medically stable for discharge   Level of care: Telemetry Medical  Consultants:  Neurology  Procedures:  None  Antimicrobials: None   Subjective: Seen and examined.  Sitting in chair.Marland Kitchen  No visible distress.  Family at bedside  Objective: Vitals:   03/16/22 2031 03/17/22 0047 03/17/22 0050 03/17/22 0908  BP:  (!) 144/93  (!) 130/94  Pulse:  (!) 43 97 81  Resp: '18 19  18  '$ Temp:  98.7 F (37.1 C)  98.6 F (37 C)  TempSrc:      SpO2:  94%  98%  Weight:      Height:        Intake/Output Summary (Last 24 hours) at 03/17/2022 1005 Last data filed at 03/16/2022 1644 Gross per 24 hour  Intake  513.91 ml  Output 700 ml  Net -186.09 ml   Filed Weights   03/16/22 0200  Weight: 79.3 kg    Examination:  General exam: NAD Respiratory system: Lungs clear.  Normal work of breathing.  Room air Cardiovascular system: S1-S2, regular rate, irregular rhythm, no murmurs Gastrointestinal system: Soft, NT/ND, normal bowel sounds Central nervous system: Alert to x 2, no focal  deficits Extremities: Symmetric 5 x 5 power. Skin: No rashes, lesions or ulcers Psychiatry: Judgement and insight appear normal. Mood & affect flattened.     Data Reviewed: I have personally reviewed following labs and imaging studies  CBC: Recent Labs  Lab 03/15/22 1610 03/16/22 0448  WBC 7.6 7.2  NEUTROABS 4.3  --   HGB 13.9 13.7  HCT 41.9 41.6  MCV 91.7 91.8  PLT 244 [id number]   Basic Metabolic Panel: Recent Labs  Lab 03/15/22 1610 03/16/22 0448  NA 136 138  K 3.9 3.5  CL 99 101  CO2 26 27  GLUCOSE 101* 104*  BUN 20 18  CREATININE 1.22 1.15  CALCIUM 9.3 9.1   GFR: Estimated Creatinine Clearance: 46.4 mL/min (by C-G formula based on SCr of 1.15 mg/dL). Liver Function Tests: Recent Labs  Lab 03/15/22 1610  AST 31  ALT 25  ALKPHOS 47  BILITOT 0.8  PROT 7.1  ALBUMIN 4.0   No results for input(s): "LIPASE", "AMYLASE" in the last 168 hours. No results for input(s): "AMMONIA" in the last 168 hours. Coagulation Profile: Recent Labs  Lab 03/15/22 1610  INR 1.1   Cardiac Enzymes: No results for input(s): "CKTOTAL", "CKMB", "CKMBINDEX", "TROPONINI" in the last 168 hours. BNP (last 3 results) No results for input(s): "PROBNP" in the last 8760 hours. HbA1C: Recent Labs    03/16/22 0445  HGBA1C 5.6   CBG: No results for input(s): "GLUCAP" in the last 168 hours. Lipid Profile: Recent Labs    03/16/22 0448  CHOL 165  HDL 50  LDLCALC 93  TRIG 109  CHOLHDL 3.3   Thyroid Function Tests: No results for input(s): "TSH", "T4TOTAL", "FREET4", "T3FREE", "THYROIDAB" in the last 72 hours. Anemia Panel: No results for input(s): "VITAMINB12", "FOLATE", "FERRITIN", "TIBC", "IRON", "RETICCTPCT" in the last 72 hours. Sepsis Labs: No results for input(s): "PROCALCITON", "LATICACIDVEN" in the last 168 hours.  No results found for this or any previous visit (from the past 240 hour(s)).       Radiology Studies: ECHOCARDIOGRAM COMPLETE BUBBLE STUDY  Result Date:  03/16/2022    ECHOCARDIOGRAM REPORT   Patient Name:   Todd Golden. Date of Exam: 03/16/2022 Medical Rec #:  [id number]            Height:       71.0 in Accession #:    [id number]           Weight:       174.8 lb Date of Birth:  09-Feb-1930            BSA:          1.991 m Patient Age:    5 years             BP:           136/94 mmHg Patient Gender: M                    HR:           96 bpm. Exam Location:  [facility] Procedure: 2D Echo, Color Doppler, Cardiac Doppler  and Saline Contrast Bubble            Study Indications:     Stroke 434.91  History:         Patient has no prior history of Echocardiogram examinations.                  Stroke; Risk Factors:Dyslipidemia and Hypertension.  Sonographer:     . Thornton-Maynard Referring Phys:  [id number] A MANSY Diagnosing Phys: Fransico Him MD IMPRESSIONS  1. Left ventricular ejection fraction, by estimation, is 40 to 45%. The left ventricle has mildly decreased function. The left ventricle demonstrates global hypokinesis. Left ventricular diastolic function could not be evaluated.  2. Right ventricular systolic function is normal. The right ventricular size is normal. There is normal pulmonary artery systolic pressure. The estimated right ventricular systolic pressure is Q000111Q mmHg.  3. The mitral valve is normal in structure. Trivial mitral valve regurgitation. No evidence of mitral stenosis.  4. The aortic valve is normal in structure. Aortic valve regurgitation is not visualized. Aortic valve sclerosis/calcification is present, without any evidence of aortic stenosis. Aortic valve area, by VTI measures 2.26 cm. Aortic valve mean gradient measures 2.7 mmHg. Aortic valve Vmax measures 1.15 m/s.  5. Aortic dilatation noted. There is mild dilatation of the ascending aorta, measuring 41 mm.  6. The inferior vena cava is normal in size with greater than 50% respiratory variability, suggesting right atrial pressure of 3 mmHg. Conclusion(s)/Recommendation(s): No  intracardiac source of embolism detected on this transthoracic study. Consider a transesophageal echocardiogram to exclude cardiac source of embolism if clinically indicated. FINDINGS  Left Ventricle: Left ventricular ejection fraction, by estimation, is 40 to 45%. The left ventricle has mildly decreased function. The left ventricle demonstrates global hypokinesis. The left ventricular internal cavity size was normal in size. There is  no left ventricular hypertrophy. Left ventricular diastolic function could not be evaluated. Right Ventricle: The right ventricular size is normal. No increase in right ventricular wall thickness. Right ventricular systolic function is normal. There is normal pulmonary artery systolic pressure. The tricuspid regurgitant velocity is 2.01 m/s, and  with an assumed right atrial pressure of 3 mmHg, the estimated right ventricular systolic pressure is Q000111Q mmHg. Left Atrium: Left atrial size was normal in size. Right Atrium: Right atrial size was normal in size. Pericardium: There is no evidence of pericardial effusion. Mitral Valve: The mitral valve is normal in structure. Mild mitral annular calcification. Trivial mitral valve regurgitation. No evidence of mitral valve stenosis. Tricuspid Valve: The tricuspid valve is normal in structure. Tricuspid valve regurgitation is trivial. No evidence of tricuspid stenosis. Aortic Valve: The aortic valve is normal in structure. Aortic valve regurgitation is not visualized. Aortic valve sclerosis/calcification is present, without any evidence of aortic stenosis. Aortic valve mean gradient measures 2.7 mmHg. Aortic valve peak  gradient measures 5.3 mmHg. Aortic valve area, by VTI measures 2.26 cm. Pulmonic Valve: The pulmonic valve was normal in structure. Pulmonic valve regurgitation is not visualized. No evidence of pulmonic stenosis. Aorta: Aortic dilatation noted. There is mild dilatation of the ascending aorta, measuring 41 mm. Venous: The  inferior vena cava is normal in size with greater than 50% respiratory variability, suggesting right atrial pressure of 3 mmHg. IAS/Shunts: No atrial level shunt detected by color flow Doppler. Agitated saline contrast was given intravenously to evaluate for intracardiac shunting.  LEFT VENTRICLE PLAX 2D LVIDd:         5.30 cm     Diastology  LVIDs:         3.60 cm     LV e' medial:    7.51 cm/s LV PW:         1.10 cm     LV E/e' medial:  10.3 LV IVS:        0.90 cm     LV e' lateral:   12.10 cm/s LVOT diam:     2.20 cm     LV E/e' lateral: 6.4 LV SV:         41 LV SV Index:   21 LVOT Area:     3.80 cm  LV Volumes (MOD) LV vol d, MOD A2C: 85.3 ml LV vol d, MOD A4C: 61.8 ml LV vol s, MOD A2C: 52.5 ml LV vol s, MOD A4C: 31.9 ml LV SV MOD A2C:     32.8 ml LV SV MOD A4C:     61.8 ml LV SV MOD BP:      33.5 ml RIGHT VENTRICLE RV S prime:     12.10 cm/s TAPSE (M-mode): 2.1 cm LEFT ATRIUM             Index        RIGHT ATRIUM           Index LA diam:        4.40 cm 2.21 cm/m   RA Area:     11.30 cm LA Vol (A2C):   69.5 ml 34.90 ml/m  RA Volume:   21.20 ml  10.65 ml/m LA Vol (A4C):   57.6 ml 28.92 ml/m LA Biplane Vol: 67.3 ml 33.80 ml/m  AORTIC VALVE                    PULMONIC VALVE AV Area (Vmax):    2.22 cm     PV Vmax:       0.96 m/s AV Area (Vmean):   2.20 cm     PV Peak grad:  3.7 mmHg AV Area (VTI):     2.26 cm AV Vmax:           115.00 cm/s AV Vmean:          82.333 cm/s AV VTI:            0.183 m AV Peak Grad:      5.3 mmHg AV Mean Grad:      2.7 mmHg LVOT Vmax:         67.10 cm/s LVOT Vmean:        47.600 cm/s LVOT VTI:          0.109 m LVOT/AV VTI ratio: 0.59  AORTA Ao Root diam: 3.70 cm Ao Asc diam:  4.10 cm MITRAL VALVE               TRICUSPID VALVE MV Area (PHT): 4.73 cm    TR Peak grad:   16.2 mmHg MV Decel Time: 161 msec    TR Vmax:        201.00 cm/s MV E velocity: 77.20 cm/s                            SHUNTS                            Systemic VTI:  0.11 m  Systemic  Diam: 2.20 cm Fransico Him MD Electronically signed by Fransico Him MD Signature Date/Time: 03/16/2022/7:18:14 PM    Final    CT ANGIO HEAD NECK W WO CM  Result Date: 03/16/2022 CLINICAL DATA:  89 year old male with falls. Small superior perirolandic white matter infarct on brain MRI yesterday. EXAM: CT ANGIOGRAPHY HEAD AND NECK TECHNIQUE: Multidetector CT imaging of the head and neck was performed using the standard protocol during bolus administration of intravenous contrast. Multiplanar CT image reconstructions and MIPs were obtained to evaluate the vascular anatomy. Carotid stenosis measurements (when applicable) are obtained utilizing NASCET criteria, using the distal internal carotid diameter as the denominator. RADIATION DOSE REDUCTION: This exam was performed according to the departmental dose-optimization program which includes automated exposure control, adjustment of the mA and/or kV according to patient size and/or use of iterative reconstruction technique. CONTRAST:  60m OMNIPAQUE IOHEXOL 350 MG/ML SOLN COMPARISON:  Brain MRI, intracranial MRA yesterday. Head CT yesterday. FINDINGS: CT HEAD Brain: Stable non contrast CT appearance of the brain. Chronic left MCA territory cortical encephalomalacia and advanced bilateral cerebral white matter disease. Small white matter infarct remains occult by CT. No acute intracranial hemorrhage identified. No midline shift, mass effect, or evidence of intracranial mass lesion. Calvarium and skull base: No acute osseous abnormality identified. Paranasal sinuses: Visualized paranasal sinuses and mastoids are stable and well aerated. Orbits: No acute orbit or scalp soft tissue finding. CTA NECK Skeleton: Age-appropriate cervical spine degeneration. No acute osseous abnormality identified. Upper chest: Minor atelectasis. Negative visible superior mediastinum. Other neck: No acute finding. Aortic arch: 3 vessel arch configuration. Moderate Calcified aortic  atherosclerosis. Right carotid system: Brachiocephalic artery tortuosity and mild atherosclerosis. Tortuous right CCA origin. No significant stenosis. Minimal plaque at the right carotid bifurcation. No stenosis to the skull base. Left carotid system: Similar tortuosity and minimal for age atherosclerosis without stenosis. Vertebral arteries: Tortuous proximal right subclavian artery with mild plaque and no stenosis. Right vertebral artery origin remains within normal limits. The right vertebral is non dominant and diminutive but remains patent to the skull base without stenosis. Proximal left subclavian artery soft and calcified plaque without stenosis. Normal left vertebral artery origin. Tortuous left V1 segment. Dominant left vertebral artery is patent to the skull base with no plaque or stenosis. CTA HEAD Posterior circulation: Dominant left vertebral artery supplies the basilar with mild calcified plaque near the left PICA origin. No stenosis. Patent left PICA. Diminutive right vertebral artery terminates in the right PICA. Patent basilar artery with mild irregularity but no significant stenosis. Patent SCA and PCA origins. Fetal type right PCA origin. Left PCA branches are within normal limits. The junction of the right PCA P2/P3 segment is moderately stenotic. See series 15, image 22. But otherwise right PCA branches are within normal limits. Anterior circulation: Both ICA siphons are patent. Mild for age calcified siphon atherosclerosis. Mild supraclinoid stenosis bilaterally. Normal posterior communicating artery origins. Patent carotid termini. Patent MCA and ACA origins. Diminutive or absent anterior communicating artery. Bilateral ACA branches are within normal limits. Left MCA M1 segment and bifurcation are patent without stenosis. Left MCA branches are within normal limits. Right MCA M1 segment and bifurcation are patent without stenosis. Right MCA branches are within normal limits. Venous sinuses:  Early contrast timing, not well evaluated. Anatomic variants: Dominant left vertebral artery supplies the basilar, diminutive right vertebral terminates in PICA. Review of the MIP images confirms the above findings IMPRESSION: 1. Negative for large vessel occlusion. Minimal for age atherosclerosis in  the neck. 2. Mild for age bilateral ICA siphon calcified plaque and supraclinoid stenosis. Moderate stenosis of the Right PCA P2/P3 junction. 3.  Stable non contrast CT appearance of the brain. 4.  Aortic Atherosclerosis (ICD10-I70.0). Electronically Signed   By: Genevie Ann M.D.   On: 03/16/2022 11:10   MR BRAIN WO CONTRAST  Result Date: 03/15/2022 CLINICAL DATA:  Multiple falls EXAM: MRI HEAD WITHOUT CONTRAST MRA HEAD WITHOUT CONTRAST TECHNIQUE: Multiplanar, multi-echo pulse sequences of the brain and surrounding structures were acquired without intravenous contrast. Angiographic images of the Circle of Willis were acquired using MRA technique without intravenous contrast. COMPARISON:  03/18/2020 FINDINGS: MRI HEAD FINDINGS Brain: Small acute infarct of the superior left frontal lobe. No acute hemorrhage. No chronic microhemorrhage or siderosis. There is confluent hyperintense T2-weighted signal within the white matter. There is advanced atrophy. Old left frontal infarct. The midline structures are normal. Vascular: Normal flow voids. Skull and upper cervical spine: Normal marrow signal. Sinuses/Orbits: No acute or significant finding. Other: None. MRA HEAD FINDINGS POSTERIOR CIRCULATION: --Vertebral arteries: Right vertebral artery terminates in PICA. Normal left. --Inferior cerebellar arteries: Normal. --Basilar artery: Normal. --Superior cerebellar arteries: Normal. --Posterior cerebral arteries: Normal. ANTERIOR CIRCULATION: --Intracranial internal carotid arteries: Normal. --Anterior cerebral arteries (ACA): Normal. --Middle cerebral arteries (MCA): Normal. IMPRESSION: 1. Small acute infarct of the superior left  frontal lobe. No acute hemorrhage or mass effect. 2. Advanced atrophy and chronic small vessel disease. 3. No emergent large vessel occlusion or high-grade stenosis. Electronically Signed   By: Ulyses Jarred M.D.   On: 03/15/2022 21:04   MR ANGIO HEAD WO CONTRAST  Result Date: 03/15/2022 CLINICAL DATA:  Multiple falls EXAM: MRI HEAD WITHOUT CONTRAST MRA HEAD WITHOUT CONTRAST TECHNIQUE: Multiplanar, multi-echo pulse sequences of the brain and surrounding structures were acquired without intravenous contrast. Angiographic images of the Circle of Willis were acquired using MRA technique without intravenous contrast. COMPARISON:  03/18/2020 FINDINGS: MRI HEAD FINDINGS Brain: Small acute infarct of the superior left frontal lobe. No acute hemorrhage. No chronic microhemorrhage or siderosis. There is confluent hyperintense T2-weighted signal within the white matter. There is advanced atrophy. Old left frontal infarct. The midline structures are normal. Vascular: Normal flow voids. Skull and upper cervical spine: Normal marrow signal. Sinuses/Orbits: No acute or significant finding. Other: None. MRA HEAD FINDINGS POSTERIOR CIRCULATION: --Vertebral arteries: Right vertebral artery terminates in PICA. Normal left. --Inferior cerebellar arteries: Normal. --Basilar artery: Normal. --Superior cerebellar arteries: Normal. --Posterior cerebral arteries: Normal. ANTERIOR CIRCULATION: --Intracranial internal carotid arteries: Normal. --Anterior cerebral arteries (ACA): Normal. --Middle cerebral arteries (MCA): Normal. IMPRESSION: 1. Small acute infarct of the superior left frontal lobe. No acute hemorrhage or mass effect. 2. Advanced atrophy and chronic small vessel disease. 3. No emergent large vessel occlusion or high-grade stenosis. Electronically Signed   By: Ulyses Jarred M.D.   On: 03/15/2022 21:04   CT HEAD WO CONTRAST  Result Date: 03/15/2022 CLINICAL DATA:  Fall EXAM: CT HEAD WITHOUT CONTRAST CT CERVICAL SPINE  WITHOUT CONTRAST TECHNIQUE: Multidetector CT imaging of the head and cervical spine was performed following the standard protocol without intravenous contrast. Multiplanar CT image reconstructions of the cervical spine were also generated. RADIATION DOSE REDUCTION: This exam was performed according to the departmental dose-optimization program which includes automated exposure control, adjustment of the mA and/or kV according to patient size and/or use of iterative reconstruction technique. COMPARISON:  CT Head 07/12/21 FINDINGS: CT HEAD FINDINGS Brain: Redemonstrated extensive periventricular white matter hypodensity with likely chronic infarcts in the  centrum semiovale on the left and the posterior left frontal lobe. No hemorrhage. No extra-axial fluid collection. Unchanged size and shape of the ventricular system. Vascular: No hyperdense vessel or unexpected calcification. Skull: Normal. Negative for fracture or focal lesion. Sinuses/Orbits: Mucosal thickening bilateral maxillary sinuses. Bilateral lens replacement. No middle ear or mastoid effusion. Other: None. CT CERVICAL SPINE FINDINGS Alignment: Normal. Skull base and vertebrae: No acute fracture. No primary bone lesion or focal pathologic process. Soft tissues and spinal canal: No prevertebral fluid or swelling. No visible canal hematoma. Disc levels:  No evidence of high-grade spinal canal stenosis. Upper chest: Negative. Other: None IMPRESSION: 1. No acute intracranial abnormality. 2. No acute fracture or traumatic listhesis in the cervical spine. Electronically Signed   By: Marin Roberts M.D.   On: 03/15/2022 17:35   CT CERVICAL SPINE WO CONTRAST  Result Date: 03/15/2022 CLINICAL DATA:  Fall EXAM: CT HEAD WITHOUT CONTRAST CT CERVICAL SPINE WITHOUT CONTRAST TECHNIQUE: Multidetector CT imaging of the head and cervical spine was performed following the standard protocol without intravenous contrast. Multiplanar CT image reconstructions of the cervical  spine were also generated. RADIATION DOSE REDUCTION: This exam was performed according to the departmental dose-optimization program which includes automated exposure control, adjustment of the mA and/or kV according to patient size and/or use of iterative reconstruction technique. COMPARISON:  CT Head 07/12/21 FINDINGS: CT HEAD FINDINGS Brain: Redemonstrated extensive periventricular white matter hypodensity with likely chronic infarcts in the centrum semiovale on the left and the posterior left frontal lobe. No hemorrhage. No extra-axial fluid collection. Unchanged size and shape of the ventricular system. Vascular: No hyperdense vessel or unexpected calcification. Skull: Normal. Negative for fracture or focal lesion. Sinuses/Orbits: Mucosal thickening bilateral maxillary sinuses. Bilateral lens replacement. No middle ear or mastoid effusion. Other: None. CT CERVICAL SPINE FINDINGS Alignment: Normal. Skull base and vertebrae: No acute fracture. No primary bone lesion or focal pathologic process. Soft tissues and spinal canal: No prevertebral fluid or swelling. No visible canal hematoma. Disc levels:  No evidence of high-grade spinal canal stenosis. Upper chest: Negative. Other: None IMPRESSION: 1. No acute intracranial abnormality. 2. No acute fracture or traumatic listhesis in the cervical spine. Electronically Signed   By: Marin Roberts M.D.   On: 03/15/2022 17:35        Scheduled Meds:  amLODipine  5 mg Oral Daily   aspirin EC  81 mg Oral Daily   donepezil  10 mg Oral Daily   doxazosin  4 mg Oral Daily   enoxaparin (LOVENOX) injection  40 mg Subcutaneous Q24H   latanoprost  1 drop Both Eyes QHS   multivitamin  1 tablet Oral Daily   pantoprazole  40 mg Oral Daily   pindolol  10 mg Oral Daily   rosuvastatin  20 mg Oral Daily   sertraline  50 mg Oral Daily   Continuous Infusions:   LOS: 2 days   Sidney Ace, MD [facility]   If 7PM-7AM, please contact  night-coverage  03/17/2022, 10:05 AM

## 2022-03-18 NOTE — Progress Notes (Signed)
Occupational Therapy Treatment Patient Details Name: Todd Golden. MRN: [id number] DOB: 31-Aug-1930 Today's Date: 03/18/2022   History of present illness Todd Golden. is a 89 y.o. male past medical history of a prior left MCA stroke with residual aphasia, paroxysmal atrial fibrillation not on anticoagulation due to falls, hypertension, hyperlipidemia, presenting to the emergency room with complaints of increasing falls 2 days prior to presentation and on the day of presentation.  He does have a history of frequent falls-has had 18 falls last year. MRI of the brain was done that showed a punctate infarct in the superior left frontal lobe.   OT comments  Todd Golden was seen for OT treatment on this date. Upon arrival to room pt seated in chair, agreeable to tx. Pt requires MOD A sit<>stand from chair x3 trials, difficulty sequencing hand placement. MIN A + RW  for functional mobility ~50 ft x3 with standing rest breaks, assist for RW mgmt and repeated cues for R foot dragging. MIN A functional reaching tasks standing at chair, minor LOBs reaching outside BOS. Pt making good progress toward goals, will continue to follow POC. Discharge recommendation remains appropriate.     Recommendations for follow up therapy are one component of a multi-disciplinary discharge planning process, led by the attending physician.  Recommendations may be updated based on patient status, additional functional criteria and insurance authorization.    Follow Up Recommendations  Acute inpatient rehab (3hours/day)     Assistance Recommended at Discharge Intermittent Supervision/Assistance  Patient can return home with the following  A little help with walking and/or transfers;A little help with bathing/dressing/bathroom;Help with stairs or ramp for entrance   Equipment Recommendations  Other (comment) (2ww)    Recommendations for Other Services      Precautions / Restrictions Precautions Precautions:  Fall Restrictions Weight Bearing Restrictions: No       Mobility Bed Mobility               General bed mobility comments: received sitting    Transfers Overall transfer level: Needs assistance Equipment used: Rolling walker (2 wheels) Transfers: Sit to/from Stand Sit to Stand: Mod assist           General transfer comment: difficulty sequencing, poor eccentric control     Balance Overall balance assessment: Needs assistance Sitting-balance support: No upper extremity supported, Feet supported Sitting balance-Leahy Scale: Fair     Standing balance support: No upper extremity supported, During functional activity Standing balance-Leahy Scale: Poor                             ADL either performed or assessed with clinical judgement   ADL Overall ADL's : Needs assistance/impaired                                       General ADL Comments: MIN A + RW  for functional mobility ~50 ft x3 with standing rest breaks, assist for RW mgmt and repeated cues for R foot dragging. MIN A functional reaching tasks standing at chair, minor LOBs reaching outside BOS      Cognition Arousal/Alertness: Awake/alert Behavior During Therapy: WFL for tasks assessed/performed Overall Cognitive Status: Within Functional Limits for tasks assessed  General Comments: hand over hand assist to sequence sit<>stand technique                   Pertinent Vitals/ Pain       Pain Assessment Pain Assessment: No/denies pain   Frequency  Min 3X/week        Progress Toward Goals  OT Goals(current goals can now be found in the care plan section)  Progress towards OT goals: Progressing toward goals  Acute Rehab OT Goals Patient Stated Goal: to not fall OT Goal Formulation: With patient/family Time For Goal Achievement: 03/30/22 Potential to Achieve Goals: Good ADL Goals Pt Will Perform Grooming:  standing;Independently Pt Will Perform Lower Body Dressing: with modified independence;sit to/from stand Pt Will Transfer to Toilet: with modified independence;ambulating;regular height toilet  Plan Discharge plan remains appropriate;Frequency remains appropriate    Co-evaluation                 AM-PAC OT "6 Clicks" Daily Activity     Outcome Measure   Help from another person eating meals?: A Little Help from another person taking care of personal grooming?: A Little Help from another person toileting, which includes using toliet, bedpan, or urinal?: A Little Help from another person bathing (including washing, rinsing, drying)?: A Lot Help from another person to put on and taking off regular upper body clothing?: A Little Help from another person to put on and taking off regular lower body clothing?: A Little 6 Click Score: 17    End of Session    OT Visit Diagnosis: Other abnormalities of gait and mobility (R26.89);Muscle weakness (generalized) (M62.81)   Activity Tolerance Patient tolerated treatment well   Patient Left with call bell/phone within reach;with family/visitor present;in chair   Nurse Communication          Time: [id number] OT Time Calculation (min): 14 min  Charges: OT General Charges $OT Visit: 1 Visit OT Treatments $Therapeutic Activity: 8-22 mins  Dessie Coma, M.S. OTR/L  03/18/22, 9:40 AM  ascom [phone]

## 2022-03-18 NOTE — TOC Progression Note (Signed)
Transition of Care [facility]) - Progression Note    Patient Details  Name: Todd Golden. MRN: [id number] Date of Birth: Jun 11, 1930  [facility] CM/SW Contact  Gerilyn Pilgrim, LCSW Phone Number: 03/18/2022, 8:47 AM  Clinical Narrative:   Recs for AIR. CSW will continue to follow care plan for updates/changes.          Expected Discharge Plan and Services                                               Social Determinants of Health (SDOH) Interventions SDOH Screenings   Food Insecurity: No Food Insecurity (03/16/2022)  Housing: Low Risk  (03/16/2022)  Transportation Needs: No Transportation Needs (03/16/2022)  Utilities: Not At Risk (03/16/2022)  Alcohol Screen: Low Risk  (11/14/2020)  Depression (PHQ2-9): Low Risk  (05/23/2021)  Financial Resource Strain: Low Risk  (05/23/2021)  Physical Activity: Sufficiently Active (11/14/2020)  Social Connections: Moderately Integrated (05/23/2021)  Stress: No Stress Concern Present (05/23/2021)  Tobacco Use: Low Risk  (03/16/2022)    Readmission Risk Interventions     No data to display

## 2022-03-19 ENCOUNTER — Inpatient Hospital Stay (HOSPITAL_COMMUNITY)
Admission: RE | Admit: 2022-03-19 | Discharge: 2022-03-29 | DRG: 057 | Disposition: A | Discharge status: Home Health | Payer: PPO | Source: Other Acute Inpatient Hospital | Attending: Physical Medicine and Rehabilitation | Admitting: Physical Medicine and Rehabilitation

## 2022-03-19 ENCOUNTER — Encounter (HOSPITAL_COMMUNITY): Payer: Self-pay | Admitting: Physical Medicine and Rehabilitation

## 2022-03-19 ENCOUNTER — Other Ambulatory Visit: Payer: Self-pay

## 2022-03-19 DIAGNOSIS — Z96652 Presence of left artificial knee joint: Secondary | ICD-10-CM | POA: Diagnosis not present

## 2022-03-19 DIAGNOSIS — Z87442 Personal history of urinary calculi: Secondary | ICD-10-CM

## 2022-03-19 DIAGNOSIS — N179 Acute kidney failure, unspecified: Secondary | ICD-10-CM | POA: Diagnosis present

## 2022-03-19 DIAGNOSIS — Z823 Family history of stroke: Secondary | ICD-10-CM

## 2022-03-19 DIAGNOSIS — F0153 Vascular dementia, unspecified severity, with mood disturbance: Secondary | ICD-10-CM | POA: Diagnosis not present

## 2022-03-19 DIAGNOSIS — Z79899 Other long term (current) drug therapy: Secondary | ICD-10-CM | POA: Diagnosis not present

## 2022-03-19 DIAGNOSIS — E8809 Other disorders of plasma-protein metabolism, not elsewhere classified: Secondary | ICD-10-CM | POA: Diagnosis present

## 2022-03-19 DIAGNOSIS — I639 Cerebral infarction, unspecified: Secondary | ICD-10-CM | POA: Diagnosis not present

## 2022-03-19 DIAGNOSIS — K5909 Other constipation: Secondary | ICD-10-CM | POA: Diagnosis present

## 2022-03-19 DIAGNOSIS — K219 Gastro-esophageal reflux disease without esophagitis: Secondary | ICD-10-CM | POA: Diagnosis present

## 2022-03-19 DIAGNOSIS — Z608 Other problems related to social environment: Secondary | ICD-10-CM | POA: Diagnosis not present

## 2022-03-19 DIAGNOSIS — R296 Repeated falls: Secondary | ICD-10-CM | POA: Diagnosis present

## 2022-03-19 DIAGNOSIS — I69320 Aphasia following cerebral infarction: Secondary | ICD-10-CM | POA: Diagnosis not present

## 2022-03-19 DIAGNOSIS — E871 Hypo-osmolality and hyponatremia: Secondary | ICD-10-CM | POA: Diagnosis not present

## 2022-03-19 DIAGNOSIS — I69351 Hemiplegia and hemiparesis following cerebral infarction affecting right dominant side: Secondary | ICD-10-CM | POA: Diagnosis not present

## 2022-03-19 DIAGNOSIS — I480 Paroxysmal atrial fibrillation: Secondary | ICD-10-CM | POA: Diagnosis not present

## 2022-03-19 DIAGNOSIS — I63512 Cerebral infarction due to unspecified occlusion or stenosis of left middle cerebral artery: Secondary | ICD-10-CM | POA: Diagnosis not present

## 2022-03-19 DIAGNOSIS — Z66 Do not resuscitate: Secondary | ICD-10-CM | POA: Diagnosis present

## 2022-03-19 DIAGNOSIS — Z9181 History of falling: Secondary | ICD-10-CM

## 2022-03-19 DIAGNOSIS — Z8261 Family history of arthritis: Secondary | ICD-10-CM | POA: Diagnosis not present

## 2022-03-19 DIAGNOSIS — I509 Heart failure, unspecified: Secondary | ICD-10-CM | POA: Diagnosis not present

## 2022-03-19 DIAGNOSIS — N401 Enlarged prostate with lower urinary tract symptoms: Secondary | ICD-10-CM | POA: Diagnosis not present

## 2022-03-19 DIAGNOSIS — E876 Hypokalemia: Secondary | ICD-10-CM | POA: Diagnosis present

## 2022-03-19 DIAGNOSIS — F32A Depression, unspecified: Secondary | ICD-10-CM | POA: Diagnosis present

## 2022-03-19 DIAGNOSIS — Z7982 Long term (current) use of aspirin: Secondary | ICD-10-CM

## 2022-03-19 DIAGNOSIS — R531 Weakness: Secondary | ICD-10-CM | POA: Diagnosis present

## 2022-03-19 DIAGNOSIS — E785 Hyperlipidemia, unspecified: Secondary | ICD-10-CM | POA: Diagnosis present

## 2022-03-19 DIAGNOSIS — I10 Essential (primary) hypertension: Secondary | ICD-10-CM | POA: Diagnosis present

## 2022-03-19 DIAGNOSIS — I6930 Unspecified sequelae of cerebral infarction: Secondary | ICD-10-CM | POA: Diagnosis present

## 2022-03-19 LAB — CBC
HCT: 37.4 % — ABNORMAL LOW (ref 39.0–52.0)
HCT: 37.5 % — ABNORMAL LOW (ref 39.0–52.0)
Hemoglobin: 12.6 g/dL — ABNORMAL LOW (ref 13.0–17.0)
Hemoglobin: 12.9 g/dL — ABNORMAL LOW (ref 13.0–17.0)
MCH: 30.6 pg (ref 26.0–34.0)
MCH: 30.9 pg (ref 26.0–34.0)
MCHC: 33.7 g/dL (ref 30.0–36.0)
MCHC: 34.4 g/dL (ref 30.0–36.0)
MCV: 90.8 fL (ref 80.0–100.0)
MCV: 89.7 fL (ref 80.0–100.0)
Platelets: 211 10*3/uL (ref 150–400)
Platelets: 217 10*3/uL (ref 150–400)
RBC: 4.12 MIL/uL — ABNORMAL LOW (ref 4.22–5.81)
RBC: 4.18 MIL/uL — ABNORMAL LOW (ref 4.22–5.81)
RDW: 13.3 % (ref 11.5–15.5)
RDW: 13.3 % (ref 11.5–15.5)
WBC: 7.1 10*3/uL (ref 4.0–10.5)
WBC: 5.9 10*3/uL (ref 4.0–10.5)
nRBC: 0 % (ref 0.0–0.2)
nRBC: 0 % (ref 0.0–0.2)

## 2022-03-19 LAB — CREATININE, SERUM
Creatinine, Ser: 1.37 mg/dL — ABNORMAL HIGH (ref 0.61–1.24)
GFR, Estimated: 49 mL/min — ABNORMAL LOW (ref >60)

## 2022-03-19 MED ORDER — LATANOPROST 0.005 % OP SOLN
1.0000 [drp] | Freq: Every day | OPHTHALMIC | Status: DC
  Administered 2022-03-19 – 2022-03-28 (×10): 1 [drp] via OPHTHALMIC
  Filled 2022-03-19: qty 2.5

## 2022-03-19 MED ORDER — PROSIGHT PO TABS
1.0000 | ORAL_TABLET | Freq: Every day | ORAL | Status: DC
  Administered 2022-03-20 – 2022-03-29 (×10): 1 via ORAL
  Filled 2022-03-19 (×10): qty 1

## 2022-03-19 MED ORDER — ONDANSETRON HCL 4 MG PO TABS
4.0000 mg | ORAL_TABLET | Freq: Four times a day (QID) | ORAL | Status: DC | PRN
  Administered 2022-03-27: 4 mg via ORAL
  Filled 2022-03-19: qty 1

## 2022-03-19 MED ORDER — ACETAMINOPHEN 650 MG RE SUPP: 650.0000 mg | Freq: Four times a day (QID) | RECTAL | Status: DC | PRN

## 2022-03-19 MED ORDER — ENOXAPARIN SODIUM 40 MG/0.4ML IJ SOSY
40.0000 mg | PREFILLED_SYRINGE | INTRAMUSCULAR | Status: DC
  Administered 2022-03-20 – 2022-03-29 (×10): 40 mg via SUBCUTANEOUS
  Filled 2022-03-19 (×10): qty 0.4

## 2022-03-19 MED ORDER — L-METHYLFOLATE-B6-B12 3-35-2 MG PO TABS
1.0000 | ORAL_TABLET | Freq: Every day | ORAL | Status: DC
  Administered 2022-03-20 – 2022-03-29 (×10): 1 via ORAL
  Filled 2022-03-19 (×10): qty 1

## 2022-03-19 MED ORDER — ENOXAPARIN SODIUM 40 MG/0.4ML IJ SOSY: 40.0000 mg | PREFILLED_SYRINGE | INTRAMUSCULAR | Status: DC

## 2022-03-19 MED ORDER — DOXAZOSIN MESYLATE 4 MG PO TABS
4.0000 mg | ORAL_TABLET | Freq: Every day | ORAL | Status: DC
  Administered 2022-03-20 – 2022-03-29 (×10): 4 mg via ORAL
  Filled 2022-03-19 (×12): qty 1

## 2022-03-19 MED ORDER — ONDANSETRON HCL 4 MG/2ML IJ SOLN: 4.0000 mg | Freq: Four times a day (QID) | INTRAMUSCULAR | Status: DC | PRN

## 2022-03-19 MED ORDER — ASPIRIN 81 MG PO TBEC
81.0000 mg | DELAYED_RELEASE_TABLET | Freq: Every day | ORAL | Status: DC
  Administered 2022-03-20 – 2022-03-29 (×10): 81 mg via ORAL
  Filled 2022-03-19 (×10): qty 1

## 2022-03-19 MED ORDER — ROSUVASTATIN CALCIUM 20 MG PO TABS: 20.0000 mg | ORAL_TABLET | Freq: Every day | ORAL | Status: DC

## 2022-03-19 MED ORDER — EPINEPHRINE 0.3 MG/0.3ML IJ SOAJ: 0.3000 mg | INTRAMUSCULAR | Status: DC | PRN

## 2022-03-19 MED ORDER — DONEPEZIL HCL 10 MG PO TABS
10.0000 mg | ORAL_TABLET | Freq: Every day | ORAL | Status: DC
  Administered 2022-03-20 – 2022-03-29 (×10): 10 mg via ORAL
  Filled 2022-03-19 (×10): qty 1

## 2022-03-19 MED ORDER — SERTRALINE HCL 50 MG PO TABS
50.0000 mg | ORAL_TABLET | Freq: Every day | ORAL | Status: DC
  Administered 2022-03-20 – 2022-03-29 (×10): 50 mg via ORAL
  Filled 2022-03-19 (×10): qty 1

## 2022-03-19 MED ORDER — PANTOPRAZOLE SODIUM 40 MG PO TBEC
40.0000 mg | DELAYED_RELEASE_TABLET | Freq: Every day | ORAL | Status: DC
  Administered 2022-03-20 – 2022-03-29 (×10): 40 mg via ORAL
  Filled 2022-03-19 (×10): qty 1

## 2022-03-19 MED ORDER — ACETAMINOPHEN 325 MG PO TABS: 650.0000 mg | ORAL_TABLET | Freq: Four times a day (QID) | ORAL | Status: DC | PRN

## 2022-03-19 MED ORDER — BENZONATATE 100 MG PO CAPS: 100.0000 mg | ORAL_CAPSULE | Freq: Three times a day (TID) | ORAL | Status: DC | PRN

## 2022-03-19 MED ORDER — ROSUVASTATIN CALCIUM 20 MG PO TABS
20.0000 mg | ORAL_TABLET | Freq: Every day | ORAL | Status: DC
  Administered 2022-03-20 – 2022-03-29 (×10): 20 mg via ORAL
  Filled 2022-03-19 (×10): qty 1

## 2022-03-19 MED ORDER — AMLODIPINE BESYLATE 5 MG PO TABS
5.0000 mg | ORAL_TABLET | Freq: Every day | ORAL | Status: DC
  Administered 2022-03-20 – 2022-03-26 (×7): 5 mg via ORAL
  Filled 2022-03-19 (×7): qty 1

## 2022-03-19 MED ORDER — MAGNESIUM HYDROXIDE 400 MG/5ML PO SUSP
30.0000 mL | Freq: Every day | ORAL | Status: DC | PRN
  Administered 2022-03-23: 30 mL via ORAL
  Filled 2022-03-19 (×2): qty 30

## 2022-03-19 MED ORDER — TRAZODONE HCL 50 MG PO TABS: 25.0000 mg | ORAL_TABLET | Freq: Every evening | ORAL | Status: DC | PRN

## 2022-03-19 MED ORDER — PINDOLOL 5 MG PO TABS
10.0000 mg | ORAL_TABLET | Freq: Every day | ORAL | Status: DC
  Administered 2022-03-20 – 2022-03-29 (×10): 10 mg via ORAL
  Filled 2022-03-19 (×5): qty 2
  Filled 2022-03-19: qty 1
  Filled 2022-03-19 (×2): qty 2
  Filled 2022-03-19: qty 1
  Filled 2022-03-19 (×3): qty 2

## 2022-03-19 NOTE — Progress Notes (Signed)
Mobility Specialist - Progress Note    03/19/22 0937  Mobility  Activity Ambulated with assistance in hallway;Stood at bedside;Dangled on edge of bed  Level of Assistance Contact guard assist, steadying assist  Assistive Device Front wheel walker  Distance Ambulated (ft) 160 ft  Range of Motion/Exercises Active  Activity Response Tolerated well  Mobility Referral Yes  $Mobility charge 1 Mobility   Pt resting in recliner on RA upon entry. Pt STS and ambulates to hallway around NS CGA for 1 lap with AD. Pt gait was moderately stable with right leg weakness and foot drag. Pt had some trouble with speech (RN Notified and pt admitted with speech trouble). Pt returned to recliner with needs in reach and chair alarm activated. Pt wife present at bedside.   Loma Sender Mobility Specialist 03/19/22, 9:45 AM

## 2022-03-19 NOTE — Care Management Important Message (Signed)
Important Message  Patient Details  Name: Todd Golden. MRN: [id number] Date of Birth: Jun 27, 1930   Medicare Important Message Given:  Yes  Obtained signature on the initial IM. Will send to Health Information Management to be scanned into the patient's medical record.   Juliann Pulse A Kourtnei Rauber 03/19/2022, 10:32 AM

## 2022-03-19 NOTE — Discharge Summary (Signed)
Physician Discharge Summary  Todd Golden. [id number] DOB: [birth year] DOA: 03/15/2022  PCP: Crecencio Mc, MD  Admit date: 03/15/2022 Discharge date: 03/19/2022  Admitted From: Home Disposition:  CIR  Recommendations for Outpatient Follow-up:  Follow up with PCP in 1-2 weeks Follow up with neurology 6-8 weeks  Home Health:No Equipment/Devices:None  Discharge Condition:Stable  CODE STATUS:DNR  Diet recommendation: Heart healthy  Brief/Interim Summary: 89 y.o. Caucasian male with medical history significant for GERD, hypertension, dyslipidemia, nephrolithiasis and paroxysmal atrial fibrillation as well as CVA, who presented to the ER with acute onset of recurrent falls with 2 falls yesterday and 2 falls during the day as well as 1 in the ER.  His family noted that he was dragging his right lower extremity.  The patient denies any headache or dizziness or blurred vision.  He denies any paresthesias or right upper extremity weakness.  No fever or chills.  No nausea or vomiting or abdominal pain.  He denies any urinary or stool incontinence.  He has expressive aphasia from previous stroke and believes that it is slightly worse.  No chest pain or palpitations.  No cough or wheezing or hemoptysis.  No other bleeding diathesis.     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  Some residual deficit.  Remainder of CVA workup unrevealing.  No large vessel occlusion on CT angio. Mild systolic congestive heart failure chronic noted on echocardiogram Plan: Discharge to CIR.  Normotensive blood pressure goal.  Antiplatelet therapy with aspirin monotherapy.  High intensity statin.  Patient will need outpatient  follow-up with PCP and neurology to discuss transition towards therapeutic anticoagulation.  Essential hypertension, benign PTA BP meds   Paroxysmal atrial fibrillation (HCC) Continue beta-blocker, aspirin, Plavix Not a candidate for systemic anticoagulation given fall risk 2/26: I had an extensive conversation with the patient's son today regarding the risk/benefit of therapeutic anticoagulation.  At this point I do not feel comfortable starting him on therapeutic anticoagulation however this is a reasonable consideration that needs to be discussed further with primary care physician.  At this time I suggest remaining on antiplatelet therapy aspirin and discharge him to CIR.  Patient can then follow-up with his primary care physician to discuss risk/benefit of systemic anticoagulation in this patient with high fall risk.   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[place]) PTA Aricept and Zoloft   Dyslipidemia Change home Zocor to Crestor 20 mg daily Goal LDL less than 70   Chronic systolic CHF EF [id number] No prior  Will defer further workup to outpatient    Discharge Instructions  Discharge Instructions     Diet - low sodium heart healthy   Complete by: As directed    Increase activity slowly   Complete by: As directed       Allergies as of 03/19/2022   No Known Allergies      Medication List     STOP taking these medications    simvastatin 20 MG tablet Commonly known as: ZOCOR       TAKE these medications    amLODipine 5 MG tablet Commonly known as: NORVASC TAKE 1 TABLET(5 MG) BY MOUTH DAILY   aspirin EC 81 MG tablet Take 81 mg by mouth daily. Swallow whole.   benzonatate 100  MG capsule Commonly known as: TESSALON Take 1 capsule (100 mg total) by mouth 3 (three) times daily as needed.   donepezil 10 MG tablet Commonly known as: ARICEPT Take 10 mg by mouth daily.   doxazosin 4 MG tablet Commonly known as: CARDURA Take 1 tablet (4 mg total) by mouth  daily.   EPINEPHrine 0.3 mg/0.3 mL Soaj injection Commonly known as: EPI-PEN Inject 0.3 mg into the muscle as needed for anaphylaxis.   hydrochlorothiazide 25 MG tablet Commonly known as: HYDRODIURIL Take 1 tablet (25 mg total) by mouth daily.   latanoprost 0.005 % ophthalmic solution Commonly known as: XALATAN Place 1 drop into both eyes at bedtime.   omeprazole 40 MG capsule Commonly known as: PRILOSEC TAKE 1 CAPSULE(40 MG) BY MOUTH DAILY   pindolol 10 MG tablet Commonly known as: VISKEN TAKE 1 TABLET(10 MG) BY MOUTH DAILY   PRESERVISION AREDS 2 PO Take by mouth daily.   rosuvastatin 20 MG tablet Commonly known as: CRESTOR Take 1 tablet (20 mg total) by mouth daily. Start taking on: March 20, 2022   sertraline 50 MG tablet Commonly known as: ZOLOFT Take 50 mg by mouth daily.        No Known Allergies  Consultations: Neurology   Procedures/Studies: ECHOCARDIOGRAM COMPLETE BUBBLE STUDY  Result Date: 03/16/2022    ECHOCARDIOGRAM REPORT   Patient Name:   Todd Golden. Date of Exam: 03/16/2022 Medical Rec #:  [id number]            Height:       71.0 in Accession #:    [id number]           Weight:       174.8 lb Date of Birth:  Aug 21, 1930            BSA:          1.991 m Patient Age:    54 years             BP:           136/94 mmHg Patient Gender: M                    HR:           96 bpm. Exam Location:  [facility] Procedure: 2D Echo, Color Doppler, Cardiac Doppler and Saline Contrast Bubble            Study Indications:     Stroke 434.91  History:         Patient has no prior history of Echocardiogram examinations.                  Stroke; Risk Factors:Dyslipidemia and Hypertension.  Sonographer:     . Thornton-Maynard Referring Phys:  [id number] A MANSY Diagnosing Phys: Fransico Him MD IMPRESSIONS  1. Left ventricular ejection fraction, by estimation, is 40 to 45%. The left ventricle has mildly decreased function. The left ventricle demonstrates global hypokinesis.  Left ventricular diastolic function could not be evaluated.  2. Right ventricular systolic function is normal. The right ventricular size is normal. There is normal pulmonary artery systolic pressure. The estimated right ventricular systolic pressure is Q000111Q mmHg.  3. The mitral valve is normal in structure. Trivial mitral valve regurgitation. No evidence of mitral stenosis.  4. The aortic valve is normal in structure. Aortic valve regurgitation is not visualized. Aortic valve sclerosis/calcification is present, without any evidence of aortic stenosis. Aortic valve area, by VTI measures 2.26 cm.  Aortic valve mean gradient measures 2.7 mmHg. Aortic valve Vmax measures 1.15 m/s.  5. Aortic dilatation noted. There is mild dilatation of the ascending aorta, measuring 41 mm.  6. The inferior vena cava is normal in size with greater than 50% respiratory variability, suggesting right atrial pressure of 3 mmHg. Conclusion(s)/Recommendation(s): No intracardiac source of embolism detected on this transthoracic study. Consider a transesophageal echocardiogram to exclude cardiac source of embolism if clinically indicated. FINDINGS  Left Ventricle: Left ventricular ejection fraction, by estimation, is 40 to 45%. The left ventricle has mildly decreased function. The left ventricle demonstrates global hypokinesis. The left ventricular internal cavity size was normal in size. There is  no left ventricular hypertrophy. Left ventricular diastolic function could not be evaluated. Right Ventricle: The right ventricular size is normal. No increase in right ventricular wall thickness. Right ventricular systolic function is normal. There is normal pulmonary artery systolic pressure. The tricuspid regurgitant velocity is 2.01 m/s, and  with an assumed right atrial pressure of 3 mmHg, the estimated right ventricular systolic pressure is Q000111Q mmHg. Left Atrium: Left atrial size was normal in size. Right Atrium: Right atrial size was normal  in size. Pericardium: There is no evidence of pericardial effusion. Mitral Valve: The mitral valve is normal in structure. Mild mitral annular calcification. Trivial mitral valve regurgitation. No evidence of mitral valve stenosis. Tricuspid Valve: The tricuspid valve is normal in structure. Tricuspid valve regurgitation is trivial. No evidence of tricuspid stenosis. Aortic Valve: The aortic valve is normal in structure. Aortic valve regurgitation is not visualized. Aortic valve sclerosis/calcification is present, without any evidence of aortic stenosis. Aortic valve mean gradient measures 2.7 mmHg. Aortic valve peak  gradient measures 5.3 mmHg. Aortic valve area, by VTI measures 2.26 cm. Pulmonic Valve: The pulmonic valve was normal in structure. Pulmonic valve regurgitation is not visualized. No evidence of pulmonic stenosis. Aorta: Aortic dilatation noted. There is mild dilatation of the ascending aorta, measuring 41 mm. Venous: The inferior vena cava is normal in size with greater than 50% respiratory variability, suggesting right atrial pressure of 3 mmHg. IAS/Shunts: No atrial level shunt detected by color flow Doppler. Agitated saline contrast was given intravenously to evaluate for intracardiac shunting.  LEFT VENTRICLE PLAX 2D LVIDd:         5.30 cm     Diastology LVIDs:         3.60 cm     LV e' medial:    7.51 cm/s LV PW:         1.10 cm     LV E/e' medial:  10.3 LV IVS:        0.90 cm     LV e' lateral:   12.10 cm/s LVOT diam:     2.20 cm     LV E/e' lateral: 6.4 LV SV:         41 LV SV Index:   21 LVOT Area:     3.80 cm  LV Volumes (MOD) LV vol d, MOD A2C: 85.3 ml LV vol d, MOD A4C: 61.8 ml LV vol s, MOD A2C: 52.5 ml LV vol s, MOD A4C: 31.9 ml LV SV MOD A2C:     32.8 ml LV SV MOD A4C:     61.8 ml LV SV MOD BP:      33.5 ml RIGHT VENTRICLE RV S prime:     12.10 cm/s TAPSE (M-mode): 2.1 cm LEFT ATRIUM             Index  RIGHT ATRIUM           Index LA diam:        4.40 cm 2.21 cm/m   RA Area:      11.30 cm LA Vol (A2C):   69.5 ml 34.90 ml/m  RA Volume:   21.20 ml  10.65 ml/m LA Vol (A4C):   57.6 ml 28.92 ml/m LA Biplane Vol: 67.3 ml 33.80 ml/m  AORTIC VALVE                    PULMONIC VALVE AV Area (Vmax):    2.22 cm     PV Vmax:       0.96 m/s AV Area (Vmean):   2.20 cm     PV Peak grad:  3.7 mmHg AV Area (VTI):     2.26 cm AV Vmax:           115.00 cm/s AV Vmean:          82.333 cm/s AV VTI:            0.183 m AV Peak Grad:      5.3 mmHg AV Mean Grad:      2.7 mmHg LVOT Vmax:         67.10 cm/s LVOT Vmean:        47.600 cm/s LVOT VTI:          0.109 m LVOT/AV VTI ratio: 0.59  AORTA Ao Root diam: 3.70 cm Ao Asc diam:  4.10 cm MITRAL VALVE               TRICUSPID VALVE MV Area (PHT): 4.73 cm    TR Peak grad:   16.2 mmHg MV Decel Time: 161 msec    TR Vmax:        201.00 cm/s MV E velocity: 77.20 cm/s                            SHUNTS                            Systemic VTI:  0.11 m                            Systemic Diam: 2.20 cm Fransico Him MD Electronically signed by Fransico Him MD Signature Date/Time: 03/16/2022/7:18:14 PM    Final    CT ANGIO HEAD NECK W WO CM  Result Date: 03/16/2022 CLINICAL DATA:  89 year old male with falls. Small superior perirolandic white matter infarct on brain MRI yesterday. EXAM: CT ANGIOGRAPHY HEAD AND NECK TECHNIQUE: Multidetector CT imaging of the head and neck was performed using the standard protocol during bolus administration of intravenous contrast. Multiplanar CT image reconstructions and MIPs were obtained to evaluate the vascular anatomy. Carotid stenosis measurements (when applicable) are obtained utilizing NASCET criteria, using the distal internal carotid diameter as the denominator. RADIATION DOSE REDUCTION: This exam was performed according to the departmental dose-optimization program which includes automated exposure control, adjustment of the mA and/or kV according to patient size and/or use of iterative reconstruction technique. CONTRAST:  72m  OMNIPAQUE IOHEXOL 350 MG/ML SOLN COMPARISON:  Brain MRI, intracranial MRA yesterday. Head CT yesterday. FINDINGS: CT HEAD Brain: Stable non contrast CT appearance of the brain. Chronic left MCA territory cortical encephalomalacia and advanced bilateral cerebral white matter disease. Small white matter infarct remains occult by CT.  No acute intracranial hemorrhage identified. No midline shift, mass effect, or evidence of intracranial mass lesion. Calvarium and skull base: No acute osseous abnormality identified. Paranasal sinuses: Visualized paranasal sinuses and mastoids are stable and well aerated. Orbits: No acute orbit or scalp soft tissue finding. CTA NECK Skeleton: Age-appropriate cervical spine degeneration. No acute osseous abnormality identified. Upper chest: Minor atelectasis. Negative visible superior mediastinum. Other neck: No acute finding. Aortic arch: 3 vessel arch configuration. Moderate Calcified aortic atherosclerosis. Right carotid system: Brachiocephalic artery tortuosity and mild atherosclerosis. Tortuous right CCA origin. No significant stenosis. Minimal plaque at the right carotid bifurcation. No stenosis to the skull base. Left carotid system: Similar tortuosity and minimal for age atherosclerosis without stenosis. Vertebral arteries: Tortuous proximal right subclavian artery with mild plaque and no stenosis. Right vertebral artery origin remains within normal limits. The right vertebral is non dominant and diminutive but remains patent to the skull base without stenosis. Proximal left subclavian artery soft and calcified plaque without stenosis. Normal left vertebral artery origin. Tortuous left V1 segment. Dominant left vertebral artery is patent to the skull base with no plaque or stenosis. CTA HEAD Posterior circulation: Dominant left vertebral artery supplies the basilar with mild calcified plaque near the left PICA origin. No stenosis. Patent left PICA. Diminutive right vertebral artery  terminates in the right PICA. Patent basilar artery with mild irregularity but no significant stenosis. Patent SCA and PCA origins. Fetal type right PCA origin. Left PCA branches are within normal limits. The junction of the right PCA P2/P3 segment is moderately stenotic. See series 15, image 22. But otherwise right PCA branches are within normal limits. Anterior circulation: Both ICA siphons are patent. Mild for age calcified siphon atherosclerosis. Mild supraclinoid stenosis bilaterally. Normal posterior communicating artery origins. Patent carotid termini. Patent MCA and ACA origins. Diminutive or absent anterior communicating artery. Bilateral ACA branches are within normal limits. Left MCA M1 segment and bifurcation are patent without stenosis. Left MCA branches are within normal limits. Right MCA M1 segment and bifurcation are patent without stenosis. Right MCA branches are within normal limits. Venous sinuses: Early contrast timing, not well evaluated. Anatomic variants: Dominant left vertebral artery supplies the basilar, diminutive right vertebral terminates in PICA. Review of the MIP images confirms the above findings IMPRESSION: 1. Negative for large vessel occlusion. Minimal for age atherosclerosis in the neck. 2. Mild for age bilateral ICA siphon calcified plaque and supraclinoid stenosis. Moderate stenosis of the Right PCA P2/P3 junction. 3.  Stable non contrast CT appearance of the brain. 4.  Aortic Atherosclerosis (ICD10-I70.0). Electronically Signed   By: Genevie Ann M.D.   On: 03/16/2022 11:10   MR BRAIN WO CONTRAST  Result Date: 03/15/2022 CLINICAL DATA:  Multiple falls EXAM: MRI HEAD WITHOUT CONTRAST MRA HEAD WITHOUT CONTRAST TECHNIQUE: Multiplanar, multi-echo pulse sequences of the brain and surrounding structures were acquired without intravenous contrast. Angiographic images of the Circle of Willis were acquired using MRA technique without intravenous contrast. COMPARISON:  03/18/2020  FINDINGS: MRI HEAD FINDINGS Brain: Small acute infarct of the superior left frontal lobe. No acute hemorrhage. No chronic microhemorrhage or siderosis. There is confluent hyperintense T2-weighted signal within the white matter. There is advanced atrophy. Old left frontal infarct. The midline structures are normal. Vascular: Normal flow voids. Skull and upper cervical spine: Normal marrow signal. Sinuses/Orbits: No acute or significant finding. Other: None. MRA HEAD FINDINGS POSTERIOR CIRCULATION: --Vertebral arteries: Right vertebral artery terminates in PICA. Normal left. --Inferior cerebellar arteries: Normal. --Basilar artery: Normal. --Superior  cerebellar arteries: Normal. --Posterior cerebral arteries: Normal. ANTERIOR CIRCULATION: --Intracranial internal carotid arteries: Normal. --Anterior cerebral arteries (ACA): Normal. --Middle cerebral arteries (MCA): Normal. IMPRESSION: 1. Small acute infarct of the superior left frontal lobe. No acute hemorrhage or mass effect. 2. Advanced atrophy and chronic small vessel disease. 3. No emergent large vessel occlusion or high-grade stenosis. Electronically Signed   By: Ulyses Jarred M.D.   On: 03/15/2022 21:04   MR ANGIO HEAD WO CONTRAST  Result Date: 03/15/2022 CLINICAL DATA:  Multiple falls EXAM: MRI HEAD WITHOUT CONTRAST MRA HEAD WITHOUT CONTRAST TECHNIQUE: Multiplanar, multi-echo pulse sequences of the brain and surrounding structures were acquired without intravenous contrast. Angiographic images of the Circle of Willis were acquired using MRA technique without intravenous contrast. COMPARISON:  03/18/2020 FINDINGS: MRI HEAD FINDINGS Brain: Small acute infarct of the superior left frontal lobe. No acute hemorrhage. No chronic microhemorrhage or siderosis. There is confluent hyperintense T2-weighted signal within the white matter. There is advanced atrophy. Old left frontal infarct. The midline structures are normal. Vascular: Normal flow voids. Skull and upper  cervical spine: Normal marrow signal. Sinuses/Orbits: No acute or significant finding. Other: None. MRA HEAD FINDINGS POSTERIOR CIRCULATION: --Vertebral arteries: Right vertebral artery terminates in PICA. Normal left. --Inferior cerebellar arteries: Normal. --Basilar artery: Normal. --Superior cerebellar arteries: Normal. --Posterior cerebral arteries: Normal. ANTERIOR CIRCULATION: --Intracranial internal carotid arteries: Normal. --Anterior cerebral arteries (ACA): Normal. --Middle cerebral arteries (MCA): Normal. IMPRESSION: 1. Small acute infarct of the superior left frontal lobe. No acute hemorrhage or mass effect. 2. Advanced atrophy and chronic small vessel disease. 3. No emergent large vessel occlusion or high-grade stenosis. Electronically Signed   By: Ulyses Jarred M.D.   On: 03/15/2022 21:04   CT HEAD WO CONTRAST  Result Date: 03/15/2022 CLINICAL DATA:  Fall EXAM: CT HEAD WITHOUT CONTRAST CT CERVICAL SPINE WITHOUT CONTRAST TECHNIQUE: Multidetector CT imaging of the head and cervical spine was performed following the standard protocol without intravenous contrast. Multiplanar CT image reconstructions of the cervical spine were also generated. RADIATION DOSE REDUCTION: This exam was performed according to the departmental dose-optimization program which includes automated exposure control, adjustment of the mA and/or kV according to patient size and/or use of iterative reconstruction technique. COMPARISON:  CT Head 07/12/21 FINDINGS: CT HEAD FINDINGS Brain: Redemonstrated extensive periventricular white matter hypodensity with likely chronic infarcts in the centrum semiovale on the left and the posterior left frontal lobe. No hemorrhage. No extra-axial fluid collection. Unchanged size and shape of the ventricular system. Vascular: No hyperdense vessel or unexpected calcification. Skull: Normal. Negative for fracture or focal lesion. Sinuses/Orbits: Mucosal thickening bilateral maxillary sinuses.  Bilateral lens replacement. No middle ear or mastoid effusion. Other: None. CT CERVICAL SPINE FINDINGS Alignment: Normal. Skull base and vertebrae: No acute fracture. No primary bone lesion or focal pathologic process. Soft tissues and spinal canal: No prevertebral fluid or swelling. No visible canal hematoma. Disc levels:  No evidence of high-grade spinal canal stenosis. Upper chest: Negative. Other: None IMPRESSION: 1. No acute intracranial abnormality. 2. No acute fracture or traumatic listhesis in the cervical spine. Electronically Signed   By: Marin Roberts M.D.   On: 03/15/2022 17:35   CT CERVICAL SPINE WO CONTRAST  Result Date: 03/15/2022 CLINICAL DATA:  Fall EXAM: CT HEAD WITHOUT CONTRAST CT CERVICAL SPINE WITHOUT CONTRAST TECHNIQUE: Multidetector CT imaging of the head and cervical spine was performed following the standard protocol without intravenous contrast. Multiplanar CT image reconstructions of the cervical spine were also generated. RADIATION DOSE REDUCTION: This  exam was performed according to the departmental dose-optimization program which includes automated exposure control, adjustment of the mA and/or kV according to patient size and/or use of iterative reconstruction technique. COMPARISON:  CT Head 07/12/21 FINDINGS: CT HEAD FINDINGS Brain: Redemonstrated extensive periventricular white matter hypodensity with likely chronic infarcts in the centrum semiovale on the left and the posterior left frontal lobe. No hemorrhage. No extra-axial fluid collection. Unchanged size and shape of the ventricular system. Vascular: No hyperdense vessel or unexpected calcification. Skull: Normal. Negative for fracture or focal lesion. Sinuses/Orbits: Mucosal thickening bilateral maxillary sinuses. Bilateral lens replacement. No middle ear or mastoid effusion. Other: None. CT CERVICAL SPINE FINDINGS Alignment: Normal. Skull base and vertebrae: No acute fracture. No primary bone lesion or focal pathologic  process. Soft tissues and spinal canal: No prevertebral fluid or swelling. No visible canal hematoma. Disc levels:  No evidence of high-grade spinal canal stenosis. Upper chest: Negative. Other: None IMPRESSION: 1. No acute intracranial abnormality. 2. No acute fracture or traumatic listhesis in the cervical spine. Electronically Signed   By: Marin Roberts M.D.   On: 03/15/2022 17:35      Subjective: Seen and examined the day of discharge.  Stable no distress.  Mildly aphasic.  Wife at bedside.  Appropriate for discharge to skilled nursing facility.  Discharge Exam: Vitals:   03/19/22 0740 03/19/22 0800  BP: 136/84   Pulse: 76   Resp: 16   Temp: (!) 97.4 F (36.3 C) 98 F (36.7 C)  SpO2: 98%    Vitals:   03/19/22 0439 03/19/22 0730 03/19/22 0740 03/19/22 0800  BP: 118/84 132/76 136/84   Pulse: 81 66 76   Resp: '18 16 16   '$ Temp: 98.1 F (36.7 C) 97.8 F (36.6 C) (!) 97.4 F (36.3 C) 98 F (36.7 C)  TempSrc:   Oral Oral  SpO2: 97% 95% 98%   Weight:      Height:        General: Pt is alert, awake, not in acute distress Cardiovascular: RRR, S1/S2 +, no rubs, no gallops Respiratory: CTA bilaterally, no wheezing, no rhonchi Abdominal: Soft, NT, ND, bowel sounds + Extremities: no edema, no cyanosis    The results of significant diagnostics from this hospitalization (including imaging, microbiology, ancillary and laboratory) are listed below for reference.     Microbiology: No results found for this or any previous visit (from the past 240 hour(s)).   Labs: BNP (last 3 results) No results for input(s): "BNP" in the last 8760 hours. Basic Metabolic Panel: Recent Labs  Lab 03/15/22 1610 03/16/22 0448  NA 136 138  K 3.9 3.5  CL 99 101  CO2 26 27  GLUCOSE 101* 104*  BUN 20 18  CREATININE 1.22 1.15  CALCIUM 9.3 9.1   Liver Function Tests: Recent Labs  Lab 03/15/22 1610  AST 31  ALT 25  ALKPHOS 47  BILITOT 0.8  PROT 7.1  ALBUMIN 4.0   No results for  input(s): "LIPASE", "AMYLASE" in the last 168 hours. No results for input(s): "AMMONIA" in the last 168 hours. CBC: Recent Labs  Lab 03/15/22 1610 03/16/22 0448 03/19/22 0359  WBC 7.6 7.2 7.1  NEUTROABS 4.3  --   --   HGB 13.9 13.7 12.6*  HCT 41.9 41.6 37.4*  MCV 91.7 91.8 90.8  PLT 244 218 211   Cardiac Enzymes: No results for input(s): "CKTOTAL", "CKMB", "CKMBINDEX", "TROPONINI" in the last 168 hours. BNP: Invalid input(s): "POCBNP" CBG: No results for input(s): "GLUCAP" in  the last 168 hours. D-Dimer No results for input(s): "DDIMER" in the last 72 hours. Hgb A1c No results for input(s): "HGBA1C" in the last 72 hours. Lipid Profile No results for input(s): "CHOL", "HDL", "LDLCALC", "TRIG", "CHOLHDL", "LDLDIRECT" in the last 72 hours. Thyroid function studies No results for input(s): "TSH", "T4TOTAL", "T3FREE", "THYROIDAB" in the last 72 hours.  Invalid input(s): "FREET3" Anemia work up No results for input(s): "VITAMINB12", "FOLATE", "FERRITIN", "TIBC", "IRON", "RETICCTPCT" in the last 72 hours. Urinalysis    Component Value Date/Time   COLORURINE YELLOW 08/02/2020 [place] 08/02/2020 [place] 12/03/2013 1015   LABSPEC 1.020 08/02/2020 1208   LABSPEC 1.005 12/03/2013 1015   PHURINE 6.0 08/02/2020 1208   GLUCOSEU NEGATIVE 08/02/2020 1208   [place] 08/02/2020 [place] 08/02/2020 [place] 12/03/2013 1015   KETONESUR NEGATIVE 08/02/2020 1208   PROTEINUR NEGATIVE 12/03/2013 1015   UROBILINOGEN 0.2 08/02/2020 1208   NITRITE NEGATIVE 08/02/2020 [place] 08/02/2020 1208   LEUKOCYTESUR NEGATIVE 12/03/2013 1015   Sepsis Labs Recent Labs  Lab 03/15/22 1610 03/16/22 0448 03/19/22 0359  WBC 7.6 7.2 7.1   Microbiology No results found for this or any previous visit (from the past 240 hour(s)).   Time coordinating discharge: Over 30 minutes  SIGNED:   Sidney Ace, MD  [facility] 03/19/2022, 10:15 AM Pager   If 7PM-7AM, please contact night-coverage

## 2022-03-19 NOTE — Progress Notes (Signed)
Inpatient Rehabilitation Admission Medication Review by a Pharmacist  A complete drug regimen review was completed for this patient to identify any potential clinically significant medication issues.  High Risk Drug Classes Is patient taking? Indication by Medication  Antipsychotic No   Anticoagulant Yes Lovenox- vte ppx  Antibiotic No   Opioid No   Antiplatelet Yes Aspirin- cva ppx  Hypoglycemics/insulin No   Vasoactive Medication Yes Cardura, pindolol, norvasc- HTN  Chemotherapy No   Other Yes Trazodone- sleep Zoloft- MDD Aricept- memory Crestor- HLD Protonix- GERD     Type of Medication Issue Identified Description of Issue Recommendation(s)  Drug Interaction(s) (clinically significant)     Duplicate Therapy     Allergy     No Medication Administration End Date     Incorrect Dose     Additional Drug Therapy Needed     Significant med changes from prior encounter (inform family/care partners about these prior to discharge).    Other  PTA meds: HCTZ Prilosec Restart PTA meds when and if necessary during CIR admission or at time of discharge, if warranted     Clinically significant medication issues were identified that warrant physician communication and completion of prescribed/recommended actions by midnight of the next day:  No   Time spent performing this drug regimen review (minutes):  30   Esta Carmon BS, PharmD, BCPS Clinical Pharmacist 03/20/2022 6:49 AM  Contact: [phone] after 3 PM  "Be curious, not judgmental..." -Jamal Maes

## 2022-03-19 NOTE — Progress Notes (Signed)
PMR Admission Coordinator Pre-Admission Assessment   Patient: Todd Golden. is an 89 y.o., male MRN: [id number] DOB: May 28, 1930 Height: '5\' 11"'$  (180.3 cm) Weight: 79.3 kg   Insurance Information HMO: yes    PPO:      PCP:      IPA:      80/20:      OTHER:  PRIMARY: [employer]      Policy#: [id number]      Subscriber: Pt CM Name: Todd Golden       Phone#: [phone]     Fax#: [phone] Todd Golden with [employer] called with auth 2/27 for 7 days (to end 3/4) Pre-Cert#: [id number]      Employer:  Benefits:  [phone], spoke with Todd Golden Date: 01/21/2022 - 01/21/2023 Deductible: no deductible ($0) OOP Max: $3,200 ($52.68 met) CIR: $295/day co-pay for days 1-6, $0/day days 7-90 SNF:  $0/day co-pay for days 1-20, $203/day co-pay for days 21-100; limited to 100 days/benefit period Outpatient: $15/visit co-pay; limited by medical necessity Home Health:  100% coverage DME: 80% coverage; 20% co-insurance Providers: In network   SECONDARY:       Policy#:      Phone#:    Development worker, community:       Phone#:    The Engineer, petroleum" for patients in Inpatient Rehabilitation Facilities with attached "Privacy Act Clinch Records" was provided and verbally reviewed with: Family   Emergency Contact Information Contact Information       Name Relation Home Work Todd Golden Spouse [phone]               Current Medical History  Patient Admitting Diagnosis: CVA  History of Present Illness:  Todd, Golden. 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  Per chart review patient lives with spouse.  1 level home one-step to entry.  Requires intermittent assist for ADLs.  Presented to [facility] 03/15/2022 with acute onset of right-sided weakness.  CT/MRI showed small  acute infarct of the superior left frontal lobe.  No acute hemorrhage.  MRA of the head showed no emergent large vessel occlusion.  CT angiogram of the neck negative for large vessel occlusion.  Minimal atherosclerosis.  Admission chemistries unremarkable.  Echocardiogram with ejection fraction of 40 to 45%.  The left ventricle demonstrating global hypokinesis.  Neurology follow-up patient currently remains on low-dose aspirin only for now due to history of falls.  Lovenox added for DVT prophylaxis.  It was recommended follow-up outpatient with neurology services to discuss anticoagulation.  Tolerating Golden regular consistency diet.  Therapy evaluations completed due to patient decreased functional mobility right side weakness was admitted for Golden comprehensive rehab program.    Complete NIHSS TOTAL: 2   Patient's medical record from [facility] has been reviewed by the rehabilitation admission coordinator and physician.   Past Medical History      Past Medical History:  Diagnosis Date   Allergy     Arthritis     Asthma     Cataract     Chronic constipation     Colon polyps     Cough     GERD (gastroesophageal reflux disease)     Glaucoma     HOH (hard of hearing)      Golden. Uses hearing aids   Hyperlipidemia     Hypertension  Macular degeneration     Nephrolithiasis      stent   PAF (paroxysmal atrial fibrillation) ([place])      Golden. 11/2013 Dx in setting of L MCA stroke. CHA2DS2VASc = 5-->Eliquis; Golden. 11/2013 Echo: Nl LV fxn. Mildly dil LA.   Stroke [facility])      Golden. 11/2013 L MCA stroke.   Ureteral stricture        Has the patient had major surgery during 100 days prior to admission? No   Family History   family history includes Arthritis in his mother; Cancer (age of onset: 78) in his mother; Stroke in his father.   Current Medications   Current Facility-Administered Medications:    acetaminophen (TYLENOL) tablet 650 mg, 650 mg, Oral, Q6H PRN **OR** acetaminophen  (TYLENOL) suppository 650 mg, 650 mg, Rectal, Q6H PRN, Todd, Jan A, MD   amLODipine (NORVASC) tablet 5 mg, 5 mg, Oral, Daily, Todd, Jan A, MD, 5 mg at 03/18/22 [id number]   aspirin EC tablet 81 mg, 81 mg, Oral, Daily, Todd, Jan A, MD, 81 mg at 03/18/22 [id number]   benzonatate (TESSALON) capsule 100 mg, 100 mg, Oral, TID PRN, Todd, Jan A, MD   donepezil (ARICEPT) tablet 10 mg, 10 mg, Oral, Daily, Todd, Jan A, MD, 10 mg at 03/18/22 [id number]   doxazosin (CARDURA) tablet 4 mg, 4 mg, Oral, Daily, Todd, Sudheer B, MD, 4 mg at 03/18/22 0851   enoxaparin (LOVENOX) injection 40 mg, 40 mg, Subcutaneous, Q24H, Todd, Jan A, MD, 40 mg at 03/18/22 [id number]   EPINEPHrine (EPI-PEN) injection 0.3 mg, 0.3 mg, Intramuscular, PRN, Todd, Jan A, MD   latanoprost (XALATAN) 0.005 % ophthalmic solution 1 drop, 1 drop, Both Eyes, QHS, Todd, Jan A, MD, 1 drop at 03/18/22 2025   magnesium hydroxide (MILK OF MAGNESIA) suspension 30 mL, 30 mL, Oral, Daily PRN, Todd, Jan A, MD   multivitamin (PROSIGHT) tablet 1 tablet, 1 tablet, Oral, Daily, Todd, Jan A, MD, 1 tablet at 03/18/22 0854   ondansetron (ZOFRAN) tablet 4 mg, 4 mg, Oral, Q6H PRN **OR** ondansetron (ZOFRAN) injection 4 mg, 4 mg, Intravenous, Q6H PRN, Todd, Jan A, MD   pantoprazole (PROTONIX) EC tablet 40 mg, 40 mg, Oral, Daily, Todd, Jan A, MD, 40 mg at 03/18/22 [id number]   pindolol (VISKEN) tablet 10 mg, 10 mg, Oral, Daily, Todd, Jan A, MD, 10 mg at 03/18/22 [id number]   rosuvastatin (CRESTOR) tablet 20 mg, 20 mg, Oral, Daily, Todd, Sudheer B, MD, 20 mg at 03/18/22 0851   sertraline (ZOLOFT) tablet 50 mg, 50 mg, Oral, Daily, Todd, Jan A, MD, 50 mg at 03/18/22 [id number]   traZODone (DESYREL) tablet 25 mg, 25 mg, Oral, QHS PRN, Todd, Todd Merles, MD   Patients Current Diet:  Diet Order                  Diet Heart Room service appropriate? Yes; Fluid consistency: Thin  Diet effective now                         Precautions / Restrictions Precautions Precautions:  Fall Restrictions Weight Bearing Restrictions: No    Has the patient had 2 or more falls or Golden fall with injury in the past year? Yes   Prior Activity Level Limited Community (1-2x/wk): pt. went out for 1-2 appts Golden week   Prior Functional Level Self Care: Did the patient need help bathing, dressing, using the toilet or eating? Needed some help  Indoor Mobility: Did the patient need assistance with walking from room to room (with or without device)? Independent   Stairs: Did the patient need assistance with internal or external stairs (with or without device)? Dependent   Functional Cognition: Did the patient need help planning regular tasks such as shopping or remembering to take medications? Dependent   Patient Information Are you of Hispanic, Latino/Golden,or Spanish origin?: Golden. No, not of Hispanic, Latino/Golden, or Spanish origin What is your race?: Golden. White Do you need or want an interpreter to communicate with Golden doctor or health care staff?: 0. No   Patient's Response To:  Health Literacy and Transportation Is the patient able to respond to health literacy and transportation needs?: Yes Health Literacy - How often do you need to have someone help you when you read instructions, pamphlets, or other written material from your doctor or pharmacy?: Never In the past 12 months, has lack of transportation kept you from medical appointments or from getting medications?: No In the past 12 months, has lack of transportation kept you from meetings, work, or from getting things needed for daily living?: No   Home Assistive Devices / [place] Devices/Equipment: Environmental consultant (specify type) Home Equipment: Rollator (4 wheels), Rolling Walker (2 wheels)   Prior Device Use: Indicate devices/aids used by the patient prior to current illness, exacerbation or injury? Walker   Current Functional Level Cognition   Overall Cognitive Status: Within Functional Limits for tasks assessed Difficult  to assess due to: Impaired communication Orientation Level: Oriented to time General Comments: pt has expressive aphasia but with increased time is able to get his point across.    Extremity Assessment (includes Sensation/Coordination)   Upper Extremity Assessment: Generalized weakness (grossly at least 4/5 throughout; no focal weakness appreciated)  Lower Extremity Assessment:  (L LE grossly 4+/5 throughout; R LE grossly 4-/5 at hip and knee, 3-/5 ankle) RLE Deficits / Details: foot drop noted     ADLs   Overall ADL's : Needs assistance/impaired General ADL Comments: MIN Golden + RW  for functional mobility ~50 ft x3 with standing rest breaks, assist for RW mgmt and repeated cues for R foot dragging. MIN Golden functional reaching tasks standing at chair, minor LOBs reaching outside BOS     Mobility   Overal bed mobility: Needs Assistance Bed Mobility: Supine to Sit Supine to sit: Min assist, Mod assist Sit to supine: Min guard General bed mobility comments: In recliner pre/post session     Transfers   Overall transfer level: Needs assistance Equipment used: Rolling walker (2 wheels) Transfers: Sit to/from Stand Sit to Stand: Min assist, Mod assist Bed to/from chair/wheelchair/BSC transfer type:: Stand pivot Stand pivot transfers: Mod assist General transfer comment: min-mod assist of one to stand form recliner and ambulate with RW. vcs/ tcs  for handplacement, fwd wt shift, and overall improved sequencing.     Ambulation / Gait / Stairs / Wheelchair Mobility   Ambulation/Gait Ambulation/Gait assistance: Herbalist (Feet): 160 Feet Assistive device: Rolling walker (2 wheels) Gait Pattern/deviations: Step-to pattern, Step-through pattern, Decreased stance time - right, Decreased stance time - left General Gait Details: pt ambulated 160 ft total with RW + min Golden. pt has poor heel strike on BLEs. used ace wrap to assist with DF but may benefit from an AFO/ sure up? Gait  velocity: decreased     Posture / Balance Dynamic Sitting Balance Sitting balance - Comments: R post/lateral lean, close sup for static sitting balance, mod/max  assist to maintain with dynamic activities Balance Overall balance assessment: Needs assistance Sitting-balance support: No upper extremity supported, Feet supported Sitting balance-Leahy Scale: Fair Sitting balance - Comments: R post/lateral lean, close sup for static sitting balance, mod/max assist to maintain with dynamic activities Standing balance support: No upper extremity supported, During functional activity Standing balance-Leahy Scale: Poor Standing balance comment: high fall risk due to balance deficits. Reliant on RW/UE support during standing synamic task     Special needs/care consideration Special service needs none    Previous Home Environment (from acute therapy documentation) Living Arrangements: Spouse/significant other Available Help at Discharge: Family Type of Home: House Home Layout: One level Home Access: Stairs to enter [employer] of Steps: threshold (1) Bathroom Shower/Tub: Multimedia programmer: Handicapped height Bathroom Accessibility: No Home Care Services: Yes Type of Home Care Services: Home OT, Home PT   Discharge Living Setting Plans for Discharge Living Setting: Patient's home Type of Home at Discharge: House Discharge [place]: One level Discharge Home Access: Stairs to enter [place]: None Entrance Stairs-Number of Steps: 1 Discharge Bathroom Shower/Tub: Walk-in shower Discharge Bathroom Toilet: Handicapped height Discharge Bathroom Accessibility: Yes How Accessible: Accessible via walker Does the patient have any problems obtaining your medications?: No   Social/Family/Support Systems Patient Roles: Spouse Contact Information: [phone] Anticipated Caregiver: Emmerick Ando Anticipated Caregiver's Contact Information: Can do light assist  with ADLs, mostly just supervision Ability/Limitations of Caregiver: 24/7 Caregiver Availability: 24/7 Discharge Plan Discussed with Primary Caregiver: Yes Is Caregiver In Agreement with Plan?: Yes Does Caregiver/Family have Issues with Lodging/Transportation while Pt is in Rehab?: No   Goals Patient/Family Goal for Rehab: PT/OT/SLP Supervision Expected length of stay: 7-10 days Pt/Family Agrees to Admission and willing to participate: Yes Program Orientation Provided & Reviewed with Pt/Caregiver Including Roles  & Responsibilities: No   Decrease burden of Care through IP rehab admission: none   Possible need for SNF placement upon discharge: not anticipated   Patient Condition: I have reviewed medical records from [facility], spoken with CM, and patient and son. I discussed via phone for inpatient rehabilitation assessment.  Patient will benefit from ongoing PT, OT, and SLP, can actively participate in 3 hours of therapy Golden day 5 days of the week, and can make measurable gains during the admission.  Patient will also benefit from the coordinated team approach during an Inpatient Acute Rehabilitation admission.  The patient will receive intensive therapy as well as Rehabilitation physician, nursing, social worker, and care management interventions.  Due to safety, skin/wound care, disease management, medication administration, pain management, and patient education the patient requires 24 hour Golden day rehabilitation nursing.  The patient is currently min Golden with mobility and basic ADLs.  Discharge setting and therapy post discharge at home with home health is anticipated.  Patient has agreed to participate in the Acute Inpatient Rehabilitation Program and will admit today.   Preadmission Screen Completed By:  Genella Mech, 03/19/2022 8:55 AM ______________________________________________________________________   Discussed status with Dr. Dagoberto Ligas on 03/19/22 at 1000 and received  approval for admission today.   Admission Coordinator:  Genella Mech, CCC-SLP, time 1000/Date 03/19/22    Assessment/Plan: Diagnosis: Does the need for close, 24 hr/day Medical supervision in concert with the patient's rehab needs make it unreasonable for this patient to be served in Golden less intensive setting? Yes Co-Morbidities requiring supervision/potential complications: pAFib, recurrent falls; HTN; sCHF EF 45%; L frontal/ACA stroke with R hemiparesis Due to bladder management, bowel  management, safety, skin/wound care, disease management, medication administration, and patient education, does the patient require 24 hr/day rehab nursing? Yes Does the patient require coordinated care of Golden physician, rehab nurse, PT, OT, and SLP to address physical and functional deficits in the context of the above medical diagnosis(es)? Yes Addressing deficits in the following areas: balance, endurance, locomotion, strength, transferring, bowel/bladder control, bathing, dressing, feeding, grooming, toileting, cognition, and speech Can the patient actively participate in an intensive therapy program of at least 3 hrs of therapy 5 days Golden week? Yes The potential for patient to make measurable gains while on inpatient rehab is good Anticipated functional outcomes upon discharge from inpatient rehab: supervision PT, supervision OT, supervision SLP Estimated rehab length of stay to reach the above functional goals is: 7-10 days Anticipated discharge destination: Home 10. Overall Rehab/Functional Prognosis: good     MD Signature:

## 2022-03-19 NOTE — Plan of Care (Signed)
  Problem: Education: Goal: Knowledge of disease or condition will improve Outcome: Progressing Goal: Knowledge of secondary prevention will improve (MUST DOCUMENT ALL) Outcome: Progressing Goal: Knowledge of patient specific risk factors will improve [name] N/A or DELETE if not current risk factor) Outcome: Progressing   Problem: Ischemic Stroke/TIA Tissue Perfusion: Goal: Complications of ischemic stroke/TIA will be minimized Outcome: Progressing   Problem: Coping: Goal: Will identify appropriate support needs Outcome: Progressing   Problem: Health Behavior/Discharge Planning: Goal: Goals will be collaboratively established with patient/family Outcome: Progressing   Problem: Self-Care: Goal: Ability to participate in self-care as condition permits will improve Outcome: Progressing Goal: Ability to communicate needs accurately will improve Outcome: Progressing   Problem: Nutrition: Goal: Risk of aspiration will decrease Outcome: Progressing Goal: Dietary intake will improve Outcome: Progressing

## 2022-03-19 NOTE — Progress Notes (Signed)
Inpatient Rehab Admissions Coordinator:   I have a CIR bed for this pt. Today. RN may call report to [phone]. I will set up transport with [facility].  Clemens Catholic, [place], [place] Admissions Coordinator  [phone] ([place]) [phone] (office)

## 2022-03-19 NOTE — TOC Transition Note (Signed)
Transition of Care [facility]) - CM/SW Discharge Note   Patient Details  Name: Todd Golden. MRN: [id number] Date of Birth: Jul 30, 1930  [facility] CM/SW Contact:  Gerilyn Pilgrim, LCSW Phone Number: 03/19/2022, 10:01 AM   Clinical Narrative:  Pt approved for CIR. CIR to arrange transport via [facility]. No additional needs. CSW signing off.             Patient Goals and CMS Choice      Discharge Placement                         Discharge Plan and Services Additional resources added to the After Visit Summary for                                       Social Determinants of Health (SDOH) Interventions SDOH Screenings   Food Insecurity: No Food Insecurity (03/16/2022)  Housing: Low Risk  (03/16/2022)  Transportation Needs: No Transportation Needs (03/16/2022)  Utilities: Not At Risk (03/16/2022)  Alcohol Screen: Low Risk  (11/14/2020)  Depression (PHQ2-9): Low Risk  (05/23/2021)  Financial Resource Strain: Low Risk  (05/23/2021)  Physical Activity: Sufficiently Active (11/14/2020)  Social Connections: Moderately Integrated (05/23/2021)  Stress: No Stress Concern Present (05/23/2021)  Tobacco Use: Low Risk  (03/16/2022)     Readmission Risk Interventions     No data to display

## 2022-03-19 NOTE — PMR Pre-admission (Signed)
PMR Admission Coordinator Pre-Admission Assessment  Patient: Todd Golden. is an 89 y.o., male MRN: [id number] DOB: May 02, 1930 Height: '5\' 11"'$  (180.3 cm) Weight: 79.3 kg  Insurance Information HMO: yes    PPO:      PCP:      IPA:      80/20:      OTHER:  PRIMARY: [employer]      Policy#: [id number]      Subscriber: Pt CM Name: Tammy       Phone#: [phone]     Fax#: [phone] Tammy with [employer] called with auth 2/27 for 7 days (to end 3/4) Pre-Cert#: [id number]      Employer:  Benefits:  [id number] 1, spoke with Vennie Homans Date: 01/21/2022 - 01/21/2023 Deductible: no deductible ($0) OOP Max: $3,200 ($52.68 met) CIR: $295/day co-pay for days 1-6, $0/day days 7-90 SNF:  $0/day co-pay for days 1-20, $203/day co-pay for days 21-100; limited to 100 days/benefit period Outpatient: $15/visit co-pay; limited by medical necessity Home Health:  100% coverage DME: 80% coverage; 20% co-insurance Providers: In network   SECONDARY:       Policy#:      Phone#:   Development worker, community:       Phone#:   The Engineer, petroleum" for patients in Inpatient Rehabilitation Facilities with attached "Privacy Act [place] Records" was provided and verbally reviewed with: Family  Emergency Contact Information Contact Information     Name Relation Home Work [place] Spouse [phone]         Current Medical History  Patient Admitting Diagnosis: CVA  History of Present Illness:  Todd Golden, Todd Golden. 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  Per chart review patient lives with spouse.  1 level home one-step to entry.  Requires intermittent assist for ADLs.  Presented to [facility]/2022 with acute onset of right-sided weakness.  CT/MRI showed small acute infarct  of the superior left frontal lobe.  No acute hemorrhage.  MRA of the head showed no emergent large vessel occlusion.  CT angiogram of the neck negative for large vessel occlusion.  Minimal atherosclerosis.  Admission chemistries unremarkable.  Echocardiogram with ejection fraction of 40 to 45%.  The left ventricle demonstrating global hypokinesis.  Neurology follow-up patient currently remains on low-dose aspirin only for now due to history of falls.  Lovenox added for DVT prophylaxis.  It was recommended follow-up outpatient with neurology services to discuss anticoagulation.  Tolerating a regular consistency diet.  Therapy evaluations completed due to patient decreased functional mobility right side weakness was admitted for a comprehensive rehab program.    Complete NIHSS TOTAL: 2  Patient's medical record from [facility] has been reviewed by the rehabilitation admission coordinator and physician.  Past Medical History  Past Medical History:  Diagnosis Date   Allergy    Arthritis    Asthma    Cataract    Chronic constipation    Colon polyps    Cough    GERD (gastroesophageal reflux disease)    Glaucoma    HOH (hard of hearing)    a. Uses hearing aids   Hyperlipidemia    Hypertension    Macular degeneration    Nephrolithiasis    stent   PAF (paroxysmal atrial fibrillation) ([place])    a. 11/2013 Dx in setting of L MCA stroke. CHA2DS2VASc =  5-->Eliquis; b. 11/2013 Echo: Nl LV fxn. Mildly dil LA.   Stroke [facility])    a. 11/2013 L MCA stroke.   Ureteral stricture     Has the patient had major surgery during 100 days prior to admission? No  Family History   family history includes Arthritis in his mother; Cancer (age of onset: 40) in his mother; Stroke in his father.  Current Medications  Current Facility-Administered Medications:    acetaminophen (TYLENOL) tablet 650 mg, 650 mg, Oral, Q6H PRN **OR** acetaminophen (TYLENOL) suppository 650 mg, 650 mg, Rectal, Q6H  PRN, Mansy, Jan A, MD   amLODipine (NORVASC) tablet 5 mg, 5 mg, Oral, Daily, Mansy, Jan A, MD, 5 mg at 03/18/22 [id number]   aspirin EC tablet 81 mg, 81 mg, Oral, Daily, Mansy, Jan A, MD, 81 mg at 03/18/22 [id number]   benzonatate (TESSALON) capsule 100 mg, 100 mg, Oral, TID PRN, Mansy, Jan A, MD   donepezil (ARICEPT) tablet 10 mg, 10 mg, Oral, Daily, Mansy, Jan A, MD, 10 mg at 03/18/22 [id number]   doxazosin (CARDURA) tablet 4 mg, 4 mg, Oral, Daily, Sreenath, Sudheer B, MD, 4 mg at 03/18/22 0851   enoxaparin (LOVENOX) injection 40 mg, 40 mg, Subcutaneous, Q24H, Mansy, Jan A, MD, 40 mg at 03/18/22 [id number]   EPINEPHrine (EPI-PEN) injection 0.3 mg, 0.3 mg, Intramuscular, PRN, Mansy, Jan A, MD   latanoprost (XALATAN) 0.005 % ophthalmic solution 1 drop, 1 drop, Both Eyes, QHS, Mansy, Jan A, MD, 1 drop at 03/18/22 2025   magnesium hydroxide (MILK OF MAGNESIA) suspension 30 mL, 30 mL, Oral, Daily PRN, Mansy, Jan A, MD   multivitamin (PROSIGHT) tablet 1 tablet, 1 tablet, Oral, Daily, Mansy, Jan A, MD, 1 tablet at 03/18/22 0854   ondansetron (ZOFRAN) tablet 4 mg, 4 mg, Oral, Q6H PRN **OR** ondansetron (ZOFRAN) injection 4 mg, 4 mg, Intravenous, Q6H PRN, Mansy, Jan A, MD   pantoprazole (PROTONIX) EC tablet 40 mg, 40 mg, Oral, Daily, Mansy, Jan A, MD, 40 mg at 03/18/22 [id number]   pindolol (VISKEN) tablet 10 mg, 10 mg, Oral, Daily, Mansy, Jan A, MD, 10 mg at 03/18/22 [id number]   rosuvastatin (CRESTOR) tablet 20 mg, 20 mg, Oral, Daily, Sreenath, Sudheer B, MD, 20 mg at 03/18/22 0851   sertraline (ZOLOFT) tablet 50 mg, 50 mg, Oral, Daily, Mansy, Jan A, MD, 50 mg at 03/18/22 [id number]   traZODone (DESYREL) tablet 25 mg, 25 mg, Oral, QHS PRN, Mansy, Arvella Merles, MD  Patients Current Diet:  Diet Order             Diet Heart Room service appropriate? Yes; Fluid consistency: Thin  Diet effective now                   Precautions / Restrictions Precautions Precautions: Fall Restrictions Weight Bearing Restrictions: No   Has the patient  had 2 or more falls or a fall with injury in the past year? Yes  Prior Activity Level Limited Community (1-2x/wk): pt. went out for 1-2 appts a week  Prior Functional Level Self Care: Did the patient need help bathing, dressing, using the toilet or eating? Needed some help  Indoor Mobility: Did the patient need assistance with walking from room to room (with or without device)? Independent  Stairs: Did the patient need assistance with internal or external stairs (with or without device)? Dependent  Functional Cognition: Did the patient need help planning regular tasks such as shopping or remembering to take medications? Dependent  Patient Information Are you  of Hispanic, Latino/a,or Spanish origin?: A. No, not of Hispanic, Latino/a, or Spanish origin What is your race?: A. White Do you need or want an interpreter to communicate with a doctor or health care staff?: 0. No  Patient's Response To:  Health Literacy and Transportation Is the patient able to respond to health literacy and transportation needs?: Yes Health Literacy - How often do you need to have someone help you when you read instructions, pamphlets, or other written material from your doctor or pharmacy?: Never In the past 12 months, has lack of transportation kept you from medical appointments or from getting medications?: No In the past 12 months, has lack of transportation kept you from meetings, work, or from getting things needed for daily living?: No  Home Assistive Devices / [place] Devices/Equipment: Environmental consultant (specify type) Home Equipment: Rollator (4 wheels), Rolling Walker (2 wheels)  Prior Device Use: Indicate devices/aids used by the patient prior to current illness, exacerbation or injury? Walker  Current Functional Level Cognition  Overall Cognitive Status: Within Functional Limits for tasks assessed Difficult to assess due to: Impaired communication Orientation Level: Oriented to  time General Comments: pt has expressive aphasia but with increased time is able to get his point across.    Extremity Assessment (includes Sensation/Coordination)  Upper Extremity Assessment: Generalized weakness (grossly at least 4/5 throughout; no focal weakness appreciated)  Lower Extremity Assessment:  (L LE grossly 4+/5 throughout; R LE grossly 4-/5 at hip and knee, 3-/5 ankle) RLE Deficits / Details: foot drop noted    ADLs  Overall ADL's : Needs assistance/impaired General ADL Comments: MIN A + RW  for functional mobility ~50 ft x3 with standing rest breaks, assist for RW mgmt and repeated cues for R foot dragging. MIN A functional reaching tasks standing at chair, minor LOBs reaching outside BOS    Mobility  Overal bed mobility: Needs Assistance Bed Mobility: Supine to Sit Supine to sit: Min assist, Mod assist Sit to supine: Min guard General bed mobility comments: In recliner pre/post session    Transfers  Overall transfer level: Needs assistance Equipment used: Rolling walker (2 wheels) Transfers: Sit to/from Stand Sit to Stand: Min assist, Mod assist Bed to/from chair/wheelchair/BSC transfer type:: Stand pivot Stand pivot transfers: Mod assist General transfer comment: min-mod assist of one to stand form recliner and ambulate with RW. vcs/ tcs  for handplacement, fwd wt shift, and overall improved sequencing.    Ambulation / Gait / Stairs / Wheelchair Mobility  Ambulation/Gait Ambulation/Gait assistance: Herbalist (Feet): 160 Feet Assistive device: Rolling walker (2 wheels) Gait Pattern/deviations: Step-to pattern, Step-through pattern, Decreased stance time - right, Decreased stance time - left General Gait Details: pt ambulated 160 ft total with RW + min A. pt has poor heel strike on BLEs. used ace wrap to assist with DF but may benefit from an AFO/ sure up? Gait velocity: decreased    Posture / Balance Dynamic Sitting Balance Sitting balance -  Comments: R post/lateral lean, close sup for static sitting balance, mod/max assist to maintain with dynamic activities Balance Overall balance assessment: Needs assistance Sitting-balance support: No upper extremity supported, Feet supported Sitting balance-Leahy Scale: Fair Sitting balance - Comments: R post/lateral lean, close sup for static sitting balance, mod/max assist to maintain with dynamic activities Standing balance support: No upper extremity supported, During functional activity Standing balance-Leahy Scale: Poor Standing balance comment: high fall risk due to balance deficits. Reliant on RW/UE support during standing synamic task  Special needs/care consideration Special service needs none   Previous Home Environment (from acute therapy documentation) Living Arrangements: Spouse/significant other Available Help at Discharge: Family Type of Home: House Home Layout: One level Home Access: Stairs to enter [employer] of Steps: threshold (1) Bathroom Shower/Tub: Multimedia programmer: Handicapped height Bathroom Accessibility: No Home Care Services: Yes Type of Home Care Services: Home OT, Home PT  Discharge Living Setting Plans for Discharge Living Setting: Patient's home Type of Home at Discharge: House Discharge [place]: One level Discharge Home Access: Stairs to enter [place]: None Entrance Stairs-Number of Steps: 1 Discharge Bathroom Shower/Tub: Walk-in shower Discharge Bathroom Toilet: Handicapped height Discharge Bathroom Accessibility: Yes How Accessible: Accessible via walker Does the patient have any problems obtaining your medications?: No  Social/Family/Support Systems Patient Roles: Spouse Contact Information: [phone] Anticipated Caregiver: Todd Golden Anticipated Caregiver's Contact Information: Can do light assist with ADLs, mostly just supervision Ability/Limitations of Caregiver: 24/7 Caregiver  Availability: 24/7 Discharge Plan Discussed with Primary Caregiver: Yes Is Caregiver In Agreement with Plan?: Yes Does Caregiver/Family have Issues with Lodging/Transportation while Pt is in Rehab?: No  Goals Patient/Family Goal for Rehab: PT/OT/SLP Supervision Expected length of stay: 7-10 days Pt/Family Agrees to Admission and willing to participate: Yes Program Orientation Provided & Reviewed with Pt/Caregiver Including Roles  & Responsibilities: No  Decrease burden of Care through IP rehab admission: none  Possible need for SNF placement upon discharge: not anticipated  Patient Condition: I have reviewed medical records from [facility], spoken with CM, and patient and son. I discussed via phone for inpatient rehabilitation assessment.  Patient will benefit from ongoing PT, OT, and SLP, can actively participate in 3 hours of therapy a day 5 days of the week, and can make measurable gains during the admission.  Patient will also benefit from the coordinated team approach during an Inpatient Acute Rehabilitation admission.  The patient will receive intensive therapy as well as Rehabilitation physician, nursing, social worker, and care management interventions.  Due to safety, skin/wound care, disease management, medication administration, pain management, and patient education the patient requires 24 hour a day rehabilitation nursing.  The patient is currently min A with mobility and basic ADLs.  Discharge setting and therapy post discharge at home with home health is anticipated.  Patient has agreed to participate in the Acute Inpatient Rehabilitation Program and will admit today.  Preadmission Screen Completed By:  Genella Mech, 03/19/2022 8:55 AM ______________________________________________________________________   Discussed status with Dr. Dagoberto Ligas on 03/19/22 at 1000 and received approval for admission today.  Admission Coordinator:  Genella Mech, CCC-SLP, time  1000/Date 03/19/22   Assessment/Plan: Diagnosis: Does the need for close, 24 hr/day Medical supervision in concert with the patient's rehab needs make it unreasonable for this patient to be served in a less intensive setting? Yes Co-Morbidities requiring supervision/potential complications: pAFib, recurrent falls; HTN; sCHF EF 45%; L frontal/ACA stroke with R hemiparesis Due to bladder management, bowel management, safety, skin/wound care, disease management, medication administration, and patient education, does the patient require 24 hr/day rehab nursing? Yes Does the patient require coordinated care of a physician, rehab nurse, PT, OT, and SLP to address physical and functional deficits in the context of the above medical diagnosis(es)? Yes Addressing deficits in the following areas: balance, endurance, locomotion, strength, transferring, bowel/bladder control, bathing, dressing, feeding, grooming, toileting, cognition, and speech Can the patient actively participate in an intensive therapy program of at least 3 hrs of therapy 5 days  a week? Yes The potential for patient to make measurable gains while on inpatient rehab is good Anticipated functional outcomes upon discharge from inpatient rehab: supervision PT, supervision OT, supervision SLP Estimated rehab length of stay to reach the above functional goals is: 7-10 days Anticipated discharge destination: Home 10. Overall Rehab/Functional Prognosis: good   MD Signature:

## 2022-03-19 NOTE — H&P (Incomplete)
Physical Medicine and Rehabilitation Admission H&P    Chief Complaint  Patient presents with   Fall   Weakness  : HPI: Todd Golden, Todd Golden. 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  Per chart review patient lives with spouse.  1 level home one-step to entry.  Requires intermittent assist for ADLs.  Presented to [facility] 03/15/2022 with acute onset of right-sided weakness.  CT/MRI showed small acute infarct of the superior left frontal lobe.  No acute hemorrhage.  MRA of the head showed no emergent large vessel occlusion.  CT angiogram of the neck negative for large vessel occlusion.  Minimal atherosclerosis.  Admission chemistries unremarkable.  Echocardiogram with ejection fraction of 40 to 45%.  The left ventricle demonstrating global hypokinesis.  Neurology follow-up patient currently remains on low-dose aspirin only for now due to history of falls.  Lovenox added for DVT prophylaxis.  It was recommended follow-up outpatient with neurology services to discuss anticoagulation.  Tolerating a regular consistency diet.  Therapy evaluations completed due to patient decreased functional mobility right side weakness was admitted for a comprehensive rehab program.  Review of Systems  Constitutional:  Negative for chills and fever.  HENT:  Positive for hearing loss.   Eyes:  Negative for blurred vision and double vision.  Respiratory:  Positive for cough. Negative for shortness of breath and wheezing.   Cardiovascular:  Positive for palpitations. Negative for chest pain and leg swelling.  Gastrointestinal:  Positive for constipation. Negative for heartburn, nausea and vomiting.       GERD  Genitourinary:  Positive for urgency. Negative for dysuria, flank pain and hematuria.  Musculoskeletal:  Positive for falls, joint pain  and myalgias.  Skin:  Negative for rash.  Neurological:  Positive for speech change and weakness.  All other systems reviewed and are negative.  Past Medical History:  Diagnosis Date   Allergy    Arthritis    Asthma    Cataract    Chronic constipation    Colon polyps    Cough    GERD (gastroesophageal reflux disease)    Glaucoma    HOH (hard of hearing)    a. Uses hearing aids   Hyperlipidemia    Hypertension    Macular degeneration    Nephrolithiasis    stent   PAF (paroxysmal atrial fibrillation) ([place])    a. 11/2013 Dx in setting of L MCA stroke. CHA2DS2VASc = 5-->Eliquis; b. 11/2013 Echo: Nl LV fxn. Mildly dil LA.   Stroke [facility])    a. 11/2013 L MCA stroke.   Ureteral stricture    Past Surgical History:  Procedure Laterality Date   CATARACT EXTRACTION W/PHACO Left 12/20/2014   Procedure: CATARACT EXTRACTION PHACO AND INTRAOCULAR LENS PLACEMENT (IOC);  Surgeon: Birder Robson, MD;  Location: [facility] ORS;  Service: Ophthalmology;  Laterality: Left;   [place]     00:59.6 AP%    21.2 CDE    12.66 fluid pack lot [id number]   JOINT REPLACEMENT Left 2014   knee partial replacement   kidney stent Right 1995   Family History  Problem Relation Age of Onset   Arthritis Mother    Cancer Mother 45       kidney   Stroke Father    Social History:  reports that he has never smoked. He has never been exposed to tobacco smoke. He has never  used smokeless tobacco. He reports current alcohol use. He reports that he does not use drugs. Allergies: No Known Allergies Medications Prior to Admission  Medication Sig Dispense Refill   amLODipine (NORVASC) 5 MG tablet TAKE 1 TABLET(5 MG) BY MOUTH DAILY 90 tablet 1   aspirin EC 81 MG tablet Take 81 mg by mouth daily. Swallow whole.     benzonatate (TESSALON) 100 MG capsule Take 1 capsule (100 mg total) by mouth 3 (three) times daily as needed. 30 capsule 0   donepezil (ARICEPT) 10 MG tablet Take 10 mg by mouth daily.     doxazosin (CARDURA) 4 MG  tablet Take 1 tablet (4 mg total) by mouth daily. 90 tablet 0   EPINEPHrine 0.3 mg/0.3 mL IJ SOAJ injection Inject 0.3 mg into the muscle as needed for anaphylaxis.     hydrochlorothiazide (HYDRODIURIL) 25 MG tablet Take 1 tablet (25 mg total) by mouth daily. 90 tablet 1   latanoprost (XALATAN) 0.005 % ophthalmic solution Place 1 drop into both eyes at bedtime.     Multiple Vitamins-Minerals (PRESERVISION AREDS 2 PO) Take by mouth daily.     omeprazole (PRILOSEC) 40 MG capsule TAKE 1 CAPSULE(40 MG) BY MOUTH DAILY 90 capsule 3   pindolol (VISKEN) 10 MG tablet TAKE 1 TABLET(10 MG) BY MOUTH DAILY 90 tablet 1   sertraline (ZOLOFT) 50 MG tablet Take 50 mg by mouth daily.     simvastatin (ZOCOR) 20 MG tablet Take 20 mg by mouth at bedtime.        Home: Home Living Family/patient expects to be discharged to:: Private residence Living Arrangements: Spouse/significant other Available Help at Discharge: Family Type of Home: House Home Access: Stairs to enter [employer] of Steps: threshold (1) Home Layout: One level Bathroom Shower/Tub: Multimedia programmer: Handicapped height Bathroom Accessibility: No Home Equipment: Rollator (4 wheels), [place] (2 wheels)   Functional History: Prior Function Prior Level of Function : History of Falls (last six months), Independent/Modified Independent Mobility Comments: RW in bathroom, rollator majority of time. Reports 18 falls last year and 10 falls since Jan this year ADLs Comments: intermittent assist for LBD  Functional Status:  Mobility: Bed Mobility Overal bed mobility: Needs Assistance Bed Mobility: Supine to Sit Supine to sit: Min assist, Mod assist Sit to supine: Min guard General bed mobility comments: In recliner pre/post session Transfers Overall transfer level: Needs assistance Equipment used: Rolling walker (2 wheels) Transfers: Sit to/from Stand Sit to Stand: Min assist, Mod assist Bed to/from  chair/wheelchair/BSC transfer type:: Stand pivot Stand pivot transfers: Mod assist General transfer comment: min-mod assist of one to stand form recliner and ambulate with RW. vcs/ tcs  for handplacement, fwd wt shift, and overall improved sequencing. Ambulation/Gait Ambulation/Gait assistance: Min assist Gait Distance (Feet): 160 Feet Assistive device: Rolling walker (2 wheels) Gait Pattern/deviations: Step-to pattern, Step-through pattern, Decreased stance time - right, Decreased stance time - left General Gait Details: pt ambulated 160 ft total with RW + min A. pt has poor heel strike on BLEs. used ace wrap to assist with DF but may benefit from an AFO/ sure up? Gait velocity: decreased    ADL: ADL Overall ADL's : Needs assistance/impaired General ADL Comments: MIN A + RW  for functional mobility ~50 ft x3 with standing rest breaks, assist for RW mgmt and repeated cues for R foot dragging. MIN A functional reaching tasks standing at chair, minor LOBs reaching outside BOS  Cognition: Cognition Overall Cognitive Status: Within Functional Limits  for tasks assessed Orientation Level: Oriented to time Cognition Arousal/Alertness: Awake/alert Behavior During Therapy: [facility] for tasks assessed/performed Overall Cognitive Status: Within Functional Limits for tasks assessed General Comments: pt has expressive aphasia but with increased time is able to get his point across. Difficult to assess due to: Impaired communication  Physical Exam: Blood pressure 136/84, pulse 76, temperature 98 F (36.7 C), temperature source Oral, resp. rate 16, height '5\' 11"'$  (1.803 m), weight 79.3 kg, SpO2 98 %. Physical Exam Neurological:     Comments: Patient is alert. Contact with examiner.  He can provide his name but not place or month.     Results for orders placed or performed during the hospital encounter of 03/15/22 (from the past 48 hour(s))  CBC     Status: Abnormal   Collection Time: 03/19/22   3:59 AM  Result Value Ref Range   WBC 7.1 4.0 - 10.5 K/uL   RBC 4.12 (L) 4.22 - 5.81 MIL/uL   Hemoglobin 12.6 (L) 13.0 - 17.0 g/dL   HCT 37.4 (L) 39.0 - 52.0 %   MCV 90.8 80.0 - 100.0 fL   MCH 30.6 26.0 - 34.0 pg   MCHC 33.7 30.0 - 36.0 g/dL   RDW 13.3 11.5 - 15.5 %   Platelets 211 150 - 400 K/uL   nRBC 0.0 0.0 - 0.2 %    Comment: Performed at [facility], [address], [place], [place] [address]   No results found.    Blood pressure 136/84, pulse 76, temperature 98 F (36.7 C), temperature source Oral, resp. rate 16, height '5\' 11"'$  (1.803 m), weight 79.3 kg, SpO2 98 %.  Medical Problem List and Plan: 1. 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  History of left MCA infarction 2015 with residual aphasia  -patient may *** shower  -ELOS/Goals: *** 2.  Antithrombotics: -DVT/anticoagulation:  Pharmaceutical: Lovenox  -antiplatelet therapy: Aspirin 81 mg daily 3. Pain Management: Tylenol as needed 4. Mood/Behavior/Sleep: Zoloft 50 mg daily, Aricept 10 mg daily, trazodone as needed  -antipsychotic agents: Provide emotional support 5. Neuropsych/cognition: This patient is not capable of making decisions on his own behalf. 6. Skin/Wound Care: Routine skin checks 7. Fluids/Electrolytes/Nutrition: Routine in and outs with follow-up chemistries 8.  PAF.  Cardiac rate currently controlled.  Plan is to continue aspirin for now follow-up outpatient to discuss anticoagulation 9.  Hypertension.  Norvasc 5 mg daily, Cardura 4 mg daily, Visken 10 mg daily.  Monitor with increased mobility 10.  Hyperlipidemia.  Crestor 11.  GERD.  Protonix   Cathlyn Parsons, PA-C 03/19/2022

## 2022-03-20 DIAGNOSIS — I63512 Cerebral infarction due to unspecified occlusion or stenosis of left middle cerebral artery: Secondary | ICD-10-CM

## 2022-03-20 LAB — COMPREHENSIVE METABOLIC PANEL
ALT: 22 U/L (ref 0–44)
AST: 28 U/L (ref 15–41)
Albumin: 3.4 g/dL — ABNORMAL LOW (ref 3.5–5.0)
Alkaline Phosphatase: 45 U/L (ref 38–126)
Anion gap: 10 (ref 5–15)
BUN: 12 mg/dL (ref 8–23)
CO2: 25 mmol/L (ref 22–32)
Calcium: 8.9 mg/dL (ref 8.9–10.3)
Chloride: 103 mmol/L (ref 98–111)
Creatinine, Ser: 1.11 mg/dL (ref 0.61–1.24)
GFR, Estimated: >60 mL/min (ref >60)
Glucose, Bld: 98 mg/dL (ref 70–99)
Potassium: 3.2 mmol/L — ABNORMAL LOW (ref 3.5–5.1)
Sodium: 138 mmol/L (ref 135–145)
Total Bilirubin: 0.8 mg/dL (ref 0.3–1.2)
Total Protein: 6.3 g/dL — ABNORMAL LOW (ref 6.5–8.1)

## 2022-03-20 LAB — CBC WITH DIFFERENTIAL/PLATELET
Abs Immature Granulocytes: 0.01 10*3/uL (ref 0.00–0.07)
Basophils Absolute: 0 10*3/uL (ref 0.0–0.1)
Basophils Relative: 1 %
Eosinophils Absolute: 0.2 10*3/uL (ref 0.0–0.5)
Eosinophils Relative: 3 %
HCT: 38.2 % — ABNORMAL LOW (ref 39.0–52.0)
Hemoglobin: 13.2 g/dL (ref 13.0–17.0)
Immature Granulocytes: 0 %
Lymphocytes Relative: 24 %
Lymphs Abs: 1.3 10*3/uL (ref 0.7–4.0)
MCH: 30.8 pg (ref 26.0–34.0)
MCHC: 34.6 g/dL (ref 30.0–36.0)
MCV: 89.3 fL (ref 80.0–100.0)
Monocytes Absolute: 0.8 10*3/uL (ref 0.1–1.0)
Monocytes Relative: 14 %
Neutro Abs: 3.1 10*3/uL (ref 1.7–7.7)
Neutrophils Relative %: 58 %
Platelets: 203 10*3/uL (ref 150–400)
RBC: 4.28 MIL/uL (ref 4.22–5.81)
RDW: 13.2 % (ref 11.5–15.5)
WBC: 5.4 10*3/uL (ref 4.0–10.5)
nRBC: 0 % (ref 0.0–0.2)

## 2022-03-20 LAB — VITAMIN B12: Vitamin B-12: 495 pg/mL (ref 180–914)

## 2022-03-20 LAB — MAGNESIUM: Magnesium: 2 mg/dL (ref 1.7–2.4)

## 2022-03-20 LAB — VITAMIN D 25 HYDROXY (VIT D DEFICIENCY, FRACTURES): Vit D, 25-Hydroxy: 44.87 ng/mL (ref 30–100)

## 2022-03-20 MED ORDER — VITAMIN D 25 MCG (1000 UNIT) PO TABS
1000.0000 [IU] | ORAL_TABLET | Freq: Every day | ORAL | Status: DC
  Administered 2022-03-20 – 2022-03-29 (×10): 1000 [IU] via ORAL
  Filled 2022-03-20 (×9): qty 1

## 2022-03-20 MED ORDER — POTASSIUM CHLORIDE 20 MEQ PO PACK
40.0000 meq | PACK | Freq: Two times a day (BID) | ORAL | Status: AC
  Administered 2022-03-20 (×2): 40 meq via ORAL
  Filled 2022-03-20 (×2): qty 2

## 2022-03-20 NOTE — Progress Notes (Signed)
[facility] Individual Statement of Services  Patient Name:  Todd Golden.  Date:  03/20/2022  Welcome to the [facility].  Our goal is to provide you with an individualized program based on your diagnosis and situation, designed to meet your specific needs.  With this comprehensive rehabilitation program, you will be expected to participate in at least 3 hours of rehabilitation therapies Monday-Friday, with modified therapy programming on the weekends.  Your rehabilitation program will include the following services:  Physical Therapy (PT), Occupational Therapy (OT), Speech Therapy (ST), 24 hour per day rehabilitation nursing, Therapeutic Recreaction (TR), Care Coordinator, Rehabilitation Medicine, Nutrition Services, and Pharmacy Services  Weekly team conferences will be held on Wednesday to discuss your progress.  Your Inpatient Rehabilitation Care Coordinator will talk with you frequently to get your input and to update you on team discussions.  Team conferences with you and your family in attendance may also be held.  Expected length of stay: 2-2.5 weeks  Overall anticipated outcome: CGA level  Depending on your progress and recovery, your program may change. Your Inpatient Rehabilitation Care Coordinator will coordinate services and will keep you informed of any changes. Your Inpatient Rehabilitation Care Coordinator's name and contact numbers are listed  below.  The following services may also be recommended but are not provided by the [facility]:   [place] will be made to provide these services after discharge if needed.  Arrangements include referral to agencies that provide these services.  Your insurance has been verified to be:  [employer] Your primary doctor is:  Deborra Medina  Pertinent information will be shared with your  doctor and your insurance company.  Inpatient Rehabilitation Care Coordinator:  Ovidio Kin, [place] or Emilia Beck  Information discussed with and copy given to patient by: Elease Hashimoto, 03/20/2022, 9:40 AM

## 2022-03-20 NOTE — Evaluation (Signed)
Speech Language Pathology Assessment and Plan  Patient Details  Name: Todd Golden. MRN: [id number] Date of Birth: [birth year]  SLP Diagnosis: Aphasia;Speech and Language deficits  Rehab Potential: Fair ELOS:     Today's Date: 03/20/2022 SLP Individual Time: 1100-1200 SLP Individual Time Calculation (min): 60 min  Hospital Problem: Principal Problem:   Left middle cerebral artery stroke [facility])  Past Medical History:  Past Medical History:  Diagnosis Date   Allergy    Arthritis    Asthma    Cataract    Chronic constipation    Colon polyps    Cough    GERD (gastroesophageal reflux disease)    Glaucoma    HOH (hard of hearing)    a. Uses hearing aids   Hyperlipidemia    Hypertension    Macular degeneration    Nephrolithiasis    stent   PAF (paroxysmal atrial fibrillation) ([place])    a. 11/2013 Dx in setting of L MCA stroke. CHA2DS2VASc = 5-->Eliquis; b. 11/2013 Echo: Nl LV fxn. Mildly dil LA.   Stroke [facility])    a. 11/2013 L MCA stroke.   Ureteral stricture    Past Surgical History:  Past Surgical History:  Procedure Laterality Date   CATARACT EXTRACTION W/PHACO Left 12/20/2014   Procedure: CATARACT EXTRACTION PHACO AND INTRAOCULAR LENS PLACEMENT (IOC);  Surgeon: Birder Robson, MD;  Location: [facility] ORS;  Service: Ophthalmology;  Laterality: Left;   [place]     00:59.6 AP%    21.2 CDE    12.66 fluid pack lot [id number]   JOINT REPLACEMENT Left 2014   knee partial replacement   kidney stent Right 1995    Assessment / Plan / Recommendation Clinical Impression Todd, Golden. 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  Per chart review patient lives with spouse.  1 level home one-step to entry.  Requires intermittent assist for ADLs.  Presented to [facility]/15/2022 with acute onset of  right-sided weakness.  CT/MRI showed small acute infarct of the superior left frontal lobe.  No acute hemorrhage. Tolerating a regular consistency diet.  Therapy evaluations completed due to patient decreased functional mobility right side weakness was admitted for a comprehensive rehab program. Currently with uncontrolled HTN.   Pt presents with moderate expressive and mild receptive language deficits. Deficits with verbal expression are characterized by word finding difficulty at the phrase and sentence level and accompanied by semantic and phonemic paraphasias, and suspected motor planning deficits.  Speech intelligibility was reduced at the phrase level. Pt was very aware of errors and able to compensate well in order to repair communication breakdowns through intentional use of additional time, use of gestures, and description strategy. Receptive deficits are characterized by reduced ability to follow 2+ step commands and mildly reduced accuracy with complex yes/no questions. Pt has unresolved aphasia and memory deficits from previous CVA and is suspected to be at baseline per his report. Pt also denied acute chewing/swallowing changes. It appears SLP was consulted this admission while in acute care by an SLP who also worked with patient in outpatient in 2022. As per chart, this SLP supported pt is at baseline function and did not recommend acute services. Spouse was not present during time of evaluation and therefore unable to confirm PLOF. Recommend further discussion with spouse to determine baseline function to further establish ST POC and/or determine appropriateness to discharge  from services. Pt would benefit from review of beneficial communication strategies to support functional communication within rehab setting. Pt verbalized agreement.   Skilled Therapeutic Interventions          SLP administered subtests of the Western Aphasia Battery (WAB-B) and further informal cognitive-linguistic, speech,  and language assessments. See report for full details.    SLP Assessment  Patient will need skilled [place] Pathology Services during CIR admission    Recommendations  Patient destination: Home Follow up Recommendations: 24 hour supervision/assistance Equipment Recommended: None recommended by SLP    SLP Frequency 1 to 3 out of 7 days   SLP Duration  SLP Intensity  SLP Treatment/Interventions    Minumum of 1-2 x/day, 30 to 90 minutes  Patient/family education    Pain Pain Assessment Pain Scale: 0-10 Pain Score: 0-No pain  Prior Functioning Cognitive/Linguistic Baseline: Baseline deficits Baseline deficit details: Baseline aphasia from previous CVA Type of Home: House  Lives With: Spouse Available Help at Discharge: Family  SLP Evaluation Cognition Overall Cognitive Status: History of cognitive impairments - at baseline Arousal/Alertness: Awake/alert Orientation Level: Oriented X4 Year: 2024 Month: February Day of Week: Correct Attention: Focused;Selective;Sustained Focused Attention: Appears intact Sustained Attention: Appears intact Selective Attention: Appears intact Memory: Impaired Awareness: Appears intact Problem Solving: Appears intact (basic problem solving appeared intact)  Comprehension Auditory Comprehension Overall Auditory Comprehension: Impaired at baseline Yes/No Questions: Impaired Basic Biographical Questions: 76-100% accurate (75-80%) Basic Immediate Environment Questions: 75-100% accurate (100%) Commands: Impaired One Step Basic Commands: 75-100% accurate (100%) Two Step Basic Commands: 75-100% accurate (80%) Conversation: Simple Interfering Components: Processing speed;Hearing EffectiveTechniques: Extra processing time;Repetition;Visual/Gestural cues Expression Expression Primary Mode of Expression: Verbal Verbal Expression Overall Verbal Expression: Impaired at baseline Initiation: Impaired Automatic Speech: Name;Social  Response;Counting Level of Generative/Spontaneous Verbalization: Phrase Repetition: Impaired Level of Impairment: Phrase level Naming: Impairment Responsive: 76-100% accurate (75%) Confrontation: Impaired Convergent: 50-74% accurate Divergent: 50-74% accurate Verbal Errors: Perseveration;Semantic paraphasias;Phonemic paraphasias;Aware of errors Pragmatics: No impairment Interfering Components: Speech intelligibility;Premorbid deficit Effective Techniques: Phonemic cues;Semantic cues;Sentence completion Non-Verbal Means of Communication: Gestures Written Expression Dominant Hand: Right Oral Motor Oral Motor/Sensory Function Overall Oral Motor/Sensory Function: Other (comment) (some difficulty motor planning - 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 Adequate - no difficulty in normal conservation, social interaction, listening to TV   Expression of Ideas and Wants Expression of Ideas and Wants: 2. Frequent difficulty - frequently exhibits difficulty with expressing needs and ideas   Understanding Verbal and Non-Verbal Content Understanding Verbal and Non-Verbal Content: 3. Usually understands - understands most conversations, but misses some part/intent of message. Requires cues at times to understand  Memory/Recall Ability Memory/Recall Ability : Current season;That he or she is in a hospital/hospital unit   Short Term Goals: Week 1: SLP Short Term Goal 1 (Week 1): Patient will exhibit understanding of education on  communication strategies by verbalizing and demonstrating use of at least 2 strategies following education with sup A verbal cues for effectiveness SLP Short Term Goal 2 (Week 1): SLP will communicate with pt's spouse to obtain further insight into pt's PLOF to further establish ST POC and/or determine  appropriateness to discharge from services.  Refer to Care Plan for Long Term Goals  Recommendations for other services: None   Discharge Criteria: Patient will be discharged from SLP if patient refuses treatment 3 consecutive times without medical reason, if treatment goals not met, if there is a change in medical status, if patient makes no progress towards goals or if patient is discharged from hospital.  The above assessment, treatment plan, treatment alternatives and goals were discussed and mutually agreed upon: by patient  Patty Sermons 03/20/2022, 4:43 PM

## 2022-03-20 NOTE — H&P (Signed)
Physical Medicine and Rehabilitation Admission H&P  CC: Left MCA CVA  HPI: Todd Golden, Todd Golden. 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  Per chart review patient lives with spouse.  1 level home one-step to entry.  Requires intermittent assist for ADLs.  Presented to [facility] 03/15/2022 with acute onset of right-sided weakness.  CT/MRI showed small acute infarct of the superior left frontal lobe.  No acute hemorrhage.  MRA of the head showed no emergent large vessel occlusion.  CT angiogram of the neck negative for large vessel occlusion.  Minimal atherosclerosis.  Admission chemistries unremarkable.  Echocardiogram with ejection fraction of 40 to 45%.  The left ventricle demonstrating global hypokinesis.  Neurology follow-up patient currently remains on low-dose aspirin only for now due to history of falls.  Lovenox added for DVT prophylaxis.  It was recommended follow-up outpatient with neurology services to discuss anticoagulation.  Tolerating a regular consistency diet.  Therapy evaluations completed due to patient decreased functional mobility right side weakness was admitted for a comprehensive rehab program. Currently with uncontrolled HTN.  Review of Systems  Constitutional:  Negative for chills and fever.  HENT:  Positive for hearing loss.   Eyes:  Negative for blurred vision and double vision.  Respiratory:  Positive for cough. Negative for shortness of breath and wheezing.   Cardiovascular:  Positive for palpitations. Negative for chest pain and leg swelling.  Gastrointestinal:  Positive for constipation. Negative for heartburn, nausea and vomiting.       GERD  Genitourinary:  Positive for urgency. Negative for dysuria, flank pain and hematuria.  Musculoskeletal:  Positive for falls, joint pain and myalgias.   Skin:  Negative for rash.  Neurological:  Positive for speech change and weakness.  All other systems reviewed and are negative.  Past Medical History:  Diagnosis Date   Allergy    Arthritis    Asthma    Cataract    Chronic constipation    Colon polyps    Cough    GERD (gastroesophageal reflux disease)    Glaucoma    HOH (hard of hearing)    a. Uses hearing aids   Hyperlipidemia    Hypertension    Macular degeneration    Nephrolithiasis    stent   PAF (paroxysmal atrial fibrillation) ([place])    a. 11/2013 Dx in setting of L MCA stroke. CHA2DS2VASc = 5-->Eliquis; b. 11/2013 Echo: Nl LV fxn. Mildly dil LA.   Stroke [facility])    a. 11/2013 L MCA stroke.   Ureteral stricture    Past Surgical History:  Procedure Laterality Date   CATARACT EXTRACTION W/PHACO Left 12/20/2014   Procedure: CATARACT EXTRACTION PHACO AND INTRAOCULAR LENS PLACEMENT (IOC);  Surgeon: Birder Robson, MD;  Location: [facility] ORS;  Service: Ophthalmology;  Laterality: Left;   [place]     00:59.6 AP%    21.2 CDE    12.66 fluid pack lot [id number]   JOINT REPLACEMENT Left 2014   knee partial replacement   kidney stent Right 1995   Family History  Problem Relation Age of Onset   Arthritis Mother    Cancer Mother 44       kidney   Stroke Father    Social History:  reports that he has never smoked. He has never been exposed to tobacco smoke. He has never used smokeless tobacco. He reports current alcohol  use. He reports that he does not use drugs. Allergies: No Known Allergies Medications Prior to Admission  Medication Sig Dispense Refill   amLODipine (NORVASC) 5 MG tablet TAKE 1 TABLET(5 MG) BY MOUTH DAILY 90 tablet 1   aspirin EC 81 MG tablet Take 81 mg by mouth daily. Swallow whole.     benzonatate (TESSALON) 100 MG capsule Take 1 capsule (100 mg total) by mouth 3 (three) times daily as needed. 30 capsule 0   donepezil (ARICEPT) 10 MG tablet Take 10 mg by mouth daily.     doxazosin (CARDURA) 4 MG tablet Take 1  tablet (4 mg total) by mouth daily. 90 tablet 0   EPINEPHrine 0.3 mg/0.3 mL IJ SOAJ injection Inject 0.3 mg into the muscle as needed for anaphylaxis.     hydrochlorothiazide (HYDRODIURIL) 25 MG tablet Take 1 tablet (25 mg total) by mouth daily. 90 tablet 1   latanoprost (XALATAN) 0.005 % ophthalmic solution Place 1 drop into both eyes at bedtime.     Multiple Vitamins-Minerals (PRESERVISION AREDS 2 PO) Take by mouth daily.     omeprazole (PRILOSEC) 40 MG capsule TAKE 1 CAPSULE(40 MG) BY MOUTH DAILY 90 capsule 3   pindolol (VISKEN) 10 MG tablet TAKE 1 TABLET(10 MG) BY MOUTH DAILY 90 tablet 1   rosuvastatin (CRESTOR) 20 MG tablet Take 1 tablet (20 mg total) by mouth daily.     sertraline (ZOLOFT) 50 MG tablet Take 50 mg by mouth daily.      Home: Home Living Family/patient expects to be discharged to:: Private residence Living Arrangements: Spouse/significant other Available Help at Discharge: Family Type of Home: House Home Access: Stairs to enter [employer] of Steps: threshold (1) Home Layout: One level Bathroom Shower/Tub: Multimedia programmer: Handicapped height Bathroom Accessibility: No Home Equipment: Rollator (4 wheels), [place] (2 wheels)   Functional History: Prior Function Prior Level of Function : History of Falls (last six months), Independent/Modified Independent Mobility Comments: RW in bathroom, rollator majority of time. Reports 18 falls last year and 10 falls since Jan this year ADLs Comments: intermittent assist for LBD   Functional Status:  Mobility: Bed Mobility Overal bed mobility: Needs Assistance Bed Mobility: Supine to Sit Supine to sit: Min assist, Mod assist Sit to supine: Min guard General bed mobility comments: In recliner pre/post session Transfers Overall transfer level: Needs assistance Equipment used: Rolling walker (2 wheels) Transfers: Sit to/from Stand Sit to Stand: Min assist, Mod assist Bed to/from  chair/wheelchair/BSC transfer type:: Stand pivot Stand pivot transfers: Mod assist General transfer comment: min-mod assist of one to stand form recliner and ambulate with RW. vcs/ tcs  for handplacement, fwd wt shift, and overall improved sequencing. Ambulation/Gait Ambulation/Gait assistance: Min assist Gait Distance (Feet): 160 Feet Assistive device: Rolling walker (2 wheels) Gait Pattern/deviations: Step-to pattern, Step-through pattern, Decreased stance time - right, Decreased stance time - left General Gait Details: pt ambulated 160 ft total with RW + min A. pt has poor heel strike on BLEs. used ace wrap to assist with DF but may benefit from an AFO/ sure up? Gait velocity: decreased   ADL: ADL Overall ADL's : Needs assistance/impaired General ADL Comments: MIN A + RW  for functional mobility ~50 ft x3 with standing rest breaks, assist for RW mgmt and repeated cues for R foot dragging. MIN A functional reaching tasks standing at chair, minor LOBs reaching outside BOS   Cognition: Cognition Overall Cognitive Status: Within Functional Limits for tasks assessed Orientation Level: Oriented  to time Cognition Arousal/Alertness: Awake/alert Behavior During Therapy: WFL for tasks assessed/performed Overall Cognitive Status: Within Functional Limits for tasks assessed General Comments: pt has expressive aphasia but with increased time is able to get his point across.      Physical Exam: Blood pressure (!) 138/94, pulse 86, temperature 97.6 F (36.4 C), temperature source Oral, resp. rate 20, height '5\' 11"'$  (1.803 m), weight 79.3 kg, SpO2 97 %. Physical Exam Gen: no distress, normal appearing HEENT: oral mucosa pink and moist, NCAT Cardio: Reg rate Chest: normal effort, normal rate of breathing Abd: soft, non-distended Ext: no edema Psych: pleasant, normal affect Skin: intact Neurological:     Comments: Patient is alert. Contact with examiner.  He can provide his name but not  place or month. Poor standing balance   Results for orders placed or performed during the hospital encounter of 03/19/22 (from the past 48 hour(s))  CBC     Status: Abnormal   Collection Time: 03/19/22  5:55 PM  Result Value Ref Range   WBC 5.9 4.0 - 10.5 K/uL   RBC 4.18 (L) 4.22 - 5.81 MIL/uL   Hemoglobin 12.9 (L) 13.0 - 17.0 g/dL   HCT 37.5 (L) 39.0 - 52.0 %   MCV 89.7 80.0 - 100.0 fL   MCH 30.9 26.0 - 34.0 pg   MCHC 34.4 30.0 - 36.0 g/dL   RDW 13.3 11.5 - 15.5 %   Platelets 217 150 - 400 K/uL   nRBC 0.0 0.0 - 0.2 %    Comment: Performed at [facility], [place] [address], [place], [place] [address]  Creatinine, serum     Status: Abnormal   Collection Time: 03/19/22  5:55 PM  Result Value Ref Range   Creatinine, Ser 1.37 (H) 0.61 - 1.24 mg/dL   GFR, Estimated 49 (L) >60 mL/min    Comment: (NOTE) Calculated using the CKD-EPI Creatinine Equation (2021) Performed at [facility] [address] [place], [place] [address]   Comprehensive metabolic panel     Status: Abnormal   Collection Time: 03/20/22  6:57 AM  Result Value Ref Range   Sodium 138 135 - 145 mmol/L   Potassium 3.2 (L) 3.5 - 5.1 mmol/L   Chloride 103 98 - 111 mmol/L   CO2 25 22 - 32 mmol/L   Glucose, Bld 98 70 - 99 mg/dL    Comment: Glucose reference range applies only to samples taken after fasting for at least 8 hours.   BUN 12 8 - 23 mg/dL   Creatinine, Ser 1.11 0.61 - 1.24 mg/dL   Calcium 8.9 8.9 - 10.3 mg/dL   Total Protein 6.3 (L) 6.5 - 8.1 g/dL   Albumin 3.4 (L) 3.5 - 5.0 g/dL   AST 28 15 - 41 U/L   ALT 22 0 - 44 U/L   Alkaline Phosphatase 45 38 - 126 U/L   Total Bilirubin 0.8 0.3 - 1.2 mg/dL   GFR, Estimated >60 >60 mL/min    Comment: (NOTE) Calculated using the CKD-EPI Creatinine Equation (2021)    Anion gap 10 5 - 15    Comment: Performed at [facility], [place] [address], [place], [place] [address]  CBC with Differential/Platelet     Status: Abnormal   Collection Time: 03/20/22   6:57 AM  Result Value Ref Range   WBC 5.4 4.0 - 10.5 K/uL   RBC 4.28 4.22 - 5.81 MIL/uL   Hemoglobin 13.2 13.0 - 17.0 g/dL   HCT  38.2 (L) 39.0 - 52.0 %   MCV 89.3 80.0 - 100.0 fL   MCH 30.8 26.0 - 34.0 pg   MCHC 34.6 30.0 - 36.0 g/dL   RDW 13.2 11.5 - 15.5 %   Platelets 203 150 - 400 K/uL   nRBC 0.0 0.0 - 0.2 %   Neutrophils Relative % 58 %   Neutro Abs 3.1 1.7 - 7.7 K/uL   Lymphocytes Relative 24 %   Lymphs Abs 1.3 0.7 - 4.0 K/uL   Monocytes Relative 14 %   Monocytes Absolute 0.8 0.1 - 1.0 K/uL   Eosinophils Relative 3 %   Eosinophils Absolute 0.2 0.0 - 0.5 K/uL   Basophils Relative 1 %   Basophils Absolute 0.0 0.0 - 0.1 K/uL   Immature Granulocytes 0 %   Abs Immature Granulocytes 0.01 0.00 - 0.07 K/uL    Comment: Performed at [facility] [address] [place], [place] [address]  Magnesium     Status: None   Collection Time: 03/20/22  6:57 AM  Result Value Ref Range   Magnesium 2.0 1.7 - 2.4 mg/dL    Comment: Performed at [facility] [address] [place], [place] [address]  VITAMIN D 25 Hydroxy (Vit-D Deficiency, Fractures)     Status: None   Collection Time: 03/20/22  6:57 AM  Result Value Ref Range   Vit D, 25-Hydroxy 44.87 30 - 100 ng/mL    Comment: (NOTE) Vitamin D deficiency has been defined by the [place] practice guideline as a level of serum 25-OH  vitamin D less than 20 ng/mL (1,2). The Endocrine Society went on to  further define vitamin D insufficiency as a level between 21 and 29  ng/mL (2).  1. IOM (Institute of Medicine). 2010. Dietary reference intakes for  calcium and D. [place]: The [employer]. 2. Holick MF, Binkley [place], Bischoff-Ferrari HA, et al. Evaluation,  treatment, and prevention of vitamin D deficiency: an Endocrine  Society clinical practice guideline, JCEM. 2011 Jul; 96(7): 1911-30.  Performed at [facility], [place] [address], [place], [place] [address]    Vitamin B12     Status: None   Collection Time: 03/20/22  6:57 AM  Result Value Ref Range   Vitamin B-12 495 180 - 914 pg/mL    Comment: (NOTE) This assay is not validated for testing neonatal or myeloproliferative syndrome specimens for Vitamin B12 levels. Performed at [facility], [place] [address], [place], [place] [address]    No results found.    Blood pressure (!) 138/94, pulse 86, temperature 97.6 F (36.4 C), temperature source Oral, resp. rate 20, height '5\' 11"'$  (1.803 m), weight 79.3 kg, SpO2 97 %.  Medical Problem List and Plan: 1. 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  History of left MCA infarction 2015 with residual aphasia  -patient may shower  -ELOS/Goals: 10-14 days S  Admit to CIR  Interdisciplinary Team Conference today   2.  Antithrombotics: -DVT/anticoagulation:  Pharmaceutical: Lovenox  -antiplatelet therapy: Aspirin 81 mg daily 3. Pain Management: Tylenol as needed 4. Mood/Behavior/Sleep: Zoloft 50 mg daily, Aricept 10 mg daily, trazodone as needed  -antipsychotic agents: Provide emotional support 5. Neuropsych/cognition: This patient is not capable of making decisions on his own behalf. 6. Skin/Wound Care: Routine skin checks 7. Fluids/Electrolytes/Nutrition: Routine in and outs with follow-up chemistries 8.  PAF.  Cardiac rate currently  controlled.  Plan is to continue aspirin for now follow-up outpatient to discuss anticoagulation 9.  Hypertension.  Norvasc 5 mg daily, Cardura 4 mg daily, Visken 10 mg daily.  Monitor with increased mobility. Add magnesium gluconate '250mg'$  HS 10.  Hyperlipidemia.  Crestor 11.  GERD.  Protonix 12. Hypoalbuminemia: encouraged high protein diet.  13. Hypokalemia: continue to supplement BID  I have personally performed a face to face diagnostic evaluation, including, but not limited to relevant history and physical exam findings, of this patient and developed  relevant assessment and plan.  Additionally, I have reviewed and concur with the physician assistant's documentation above.   Izora Ribas, MD 03/20/2022

## 2022-03-20 NOTE — Progress Notes (Signed)
Inpatient Rehabilitation  Patient information reviewed and entered into eRehab system by Doreather Hoxworth M. Lakrisha Iseman, M.A., CCC/SLP, PPS Coordinator.  Information including medical coding, functional ability and quality indicators will be reviewed and updated through discharge.    

## 2022-03-20 NOTE — Discharge Instructions (Addendum)
Inpatient Rehab Discharge Instructions  Todd Golden. Discharge date and time: No discharge date for patient encounter.   Activities/Precautions/ Functional Status: Activity: activity as tolerated Diet: regular diet Wound Care: Routine skin checks Functional status:  ___ No restrictions     ___ Walk up steps independently ___ 24/7 supervision/assistance   ___ Walk up steps with assistance ___ Intermittent supervision/assistance  ___ Bathe/dress independently ___ Walk with walker     _x__ Bathe/dress with assistance ___ Walk Independently    ___ Shower independently ___ Walk with assistance    ___ Shower with assistance ___ No alcohol     ___ Return to work/school ________  Special Instructions: No driving smoking or alcohol      COMMUNITY REFERRALS UPON DISCHARGE:    Home Health:   PT   OT    SP                Agency:[facility]   [phone]    Medical Equipment/Items Ordered: Todd Golden                                                 Agency/Supplier:[facility]   [phone]  HAS PRIVATE DUTY LIST TO HIRE ASSIST IF FEELS NEEDED. ALSO BEING SET UP WITH [facility]UST[facility] TEAM [employer]VANTAGE  STROKE/TIA DISCHARGE INSTRUCTIONS SMOKING Cigarette smoking nearly doubles your risk of having a stroke & is the single most alterable risk factor  If you smoke or have smoked in the last 12 months, you are advised to quit smoking for your health. Most of the excess cardiovascular risk related to smoking disappears within a year of stopping. Ask you doctor about anti-smoking medications [place] Quit Line: 1-800-QUIT NOW Free Smoking Cessation Classes [phone]  CHOLESTEROL Know your levels; limit fat & cholesterol in your diet  Lipid Panel     Component Value Date/Time   CHOL 165 03/16/2022 0448   CHOL 133 12/18/2020 0819   CHOL 168 12/04/2013 0549   TRIG 109 03/16/2022 0448   TRIG 96 12/04/2013 0549   HDL 50 03/16/2022 0448   HDL 55  12/18/2020 0819   HDL 44 12/04/2013 0549   CHOLHDL 3.3 03/16/2022 0448   VLDL 22 03/16/2022 0448   VLDL 19 12/04/2013 0549   LDLCALC 93 03/16/2022 0448   LDLCALC 60 12/18/2020 0819   LDLCALC 105 (H) 12/04/2013 0549     Many patients benefit from treatment even if their cholesterol is at goal. Goal: Total Cholesterol (CHOL) less than 160 Goal:  Triglycerides (TRIG) less than 150 Goal:  HDL greater than 40 Goal:  LDL (LDLCALC) less than 100   BLOOD PRESSURE American Stroke Association blood pressure target is less that 120/80 mm/Hg  Your discharge blood pressure is:  BP: (!) 138/94 Monitor your blood pressure Limit your salt and alcohol intake Many individuals will require more than one medication for high blood pressure  DIABETES (A1c is a blood sugar average for last 3 months) Goal HGBA1c is under 7% (HBGA1c is blood sugar average for last 3 months)  Diabetes: No known diagnosis of diabetes    Lab Results  Component Value Date   HGBA1C 5.6 03/16/2022    Your HGBA1c can be lowered with medications, healthy diet, and exercise. Check your blood sugar as directed by your physician Call your physician if  you experience unexplained or low blood sugars.  PHYSICAL ACTIVITY/REHABILITATION Goal is 30 minutes at least 4 days per week  Activity: Increase activity slowly, Therapies: Physical Therapy: Home Health Return to work:  Activity decreases your risk of heart attack and stroke and makes your heart stronger.  It helps control your weight and blood pressure; helps you relax and can improve your mood. Participate in a regular exercise program. Talk with your doctor about the best form of exercise for you (dancing, walking, swimming, cycling).  DIET/WEIGHT Goal is to maintain a healthy weight  Your discharge diet is:  Diet Order             Diet Heart Room service appropriate? Yes; Fluid consistency: Thin  Diet effective now                   liquids Your height is:  Height:  '5\' 11"'$  (180.3 cm) Your current weight is: Weight: 79.3 kg Your Body Mass Index (BMI) is:  BMI (Calculated): 24.39 Following the type of diet specifically designed for you will help prevent another stroke. Your goal weight range is:   Your goal Body Mass Index (BMI) is 19-24. Healthy food habits can help reduce 3 risk factors for stroke:  High cholesterol, hypertension, and excess weight.  RESOURCES Stroke/Support Group:  Call [phone]   STROKE EDUCATION PROVIDED/REVIEWED AND GIVEN TO PATIENT Stroke warning signs and symptoms How to activate emergency medical system (call 911). Medications prescribed at discharge. Need for follow-up after discharge. Personal risk factors for stroke. Pneumonia vaccine given: No Flu vaccine given: No My questions have been answered, the writing is legible, and I understand these instructions.  I will adhere to these goals & educational materials that have been provided to me after my discharge from the hospital.      My questions have been answered and I understand these instructions. I will adhere to these goals and the provided educational materials after my discharge from the hospital.  Patient/Caregiver Signature _______________________________ Date __________  Clinician Signature _______________________________________ Date __________  Please bring this form and your medication list with you to all your follow-up doctor's appointments.

## 2022-03-20 NOTE — Evaluation (Signed)
Occupational Therapy Assessment and Plan  Patient Details  Name: Todd Golden. MRN: [id number] Date of Birth: 08-16-1930  OT Diagnosis: abnormal posture, apraxia, ataxia, cognitive deficits, hemiplegia affecting dominant side, and muscle weakness (generalized) Rehab Potential: Rehab Potential (ACUTE ONLY): Good ELOS: ~2 weeks   Today's Date: 03/20/2022 OT Individual Time: [id number] OT Individual Time Calculation (min): 73 min     Hospital Problem: Principal Problem:   Left middle cerebral artery stroke [facility])   Past Medical History:  Past Medical History:  Diagnosis Date   Allergy    Arthritis    Asthma    Cataract    Chronic constipation    Colon polyps    Cough    GERD (gastroesophageal reflux disease)    Glaucoma    HOH (hard of hearing)    a. Uses hearing aids   Hyperlipidemia    Hypertension    Macular degeneration    Nephrolithiasis    stent   PAF (paroxysmal atrial fibrillation) ([place])    a. 11/2013 Dx in setting of L MCA stroke. CHA2DS2VASc = 5-->Eliquis; b. 11/2013 Echo: Nl LV fxn. Mildly dil LA.   Stroke [facility])    a. 11/2013 L MCA stroke.   Ureteral stricture    Past Surgical History:  Past Surgical History:  Procedure Laterality Date   CATARACT EXTRACTION W/PHACO Left 12/20/2014   Procedure: CATARACT EXTRACTION PHACO AND INTRAOCULAR LENS PLACEMENT (IOC);  Surgeon: Birder Robson, MD;  Location: [facility] ORS;  Service: Ophthalmology;  Laterality: Left;   [place]     00:59.6 AP%    21.2 CDE    12.66 fluid pack lot [id number]   JOINT REPLACEMENT Left 2014   knee partial replacement   kidney stent Right 1995    Assessment & Plan Clinical Impression:  Patient is a 89 y.o. 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  Per chart review patient lives with spouse.  1 level home one-step to entry.   Requires intermittent assist for ADLs.  Presented to [facility] 03/15/2022 with acute onset of right-sided weakness.  CT/MRI showed small acute infarct of the superior left frontal lobe.  No acute hemorrhage.  MRA of the head showed no emergent large vessel occlusion.  CT angiogram of the neck negative for large vessel occlusion.  Minimal atherosclerosis.  Admission chemistries unremarkable.  Echocardiogram with ejection fraction of 40 to 45%.  The left ventricle demonstrating global hypokinesis.  Neurology follow-up patient currently remains on low-dose aspirin only for now due to history of falls.  Lovenox added for DVT prophylaxis.  It was recommended follow-up outpatient with neurology services to discuss anticoagulation.  Tolerating a regular consistency diet.  Therapy evaluations completed due to patient decreased functional mobility right side weakness was admitted for a comprehensive rehab program. Currently with uncontrolled HTN.   Patient transferred to CIR on 03/19/2022 .    Patient currently requires mod with basic self-care skills and IADL secondary to muscle weakness, decreased cardiorespiratoy endurance, impaired timing and sequencing, unbalanced muscle activation, motor apraxia, ataxia, decreased coordination, and decreased motor planning, decreased motor planning, decreased initiation, decreased attention, decreased awareness, decreased problem solving, decreased safety awareness, and decreased memory, and decreased sitting balance, decreased standing balance, decreased postural control, hemiplegia, and decreased balance strategies.  Prior to hospitalization, patient could complete BADLs with  intermittent assistance for LB dressing and bathing .  Patient will benefit from  skilled intervention to decrease level of assist with basic self-care skills, increase independence with basic self-care skills, and increase level of independence with iADL prior to discharge home with care partner.  Anticipate  patient will require 24 hour supervision and follow up home health.  OT - End of Session Activity Tolerance: Decreased this session Endurance Deficit: Yes OT Assessment Rehab Potential (ACUTE ONLY): Good OT Patient demonstrates impairments in the following area(s): Balance;Endurance;Perception;Vision;Motor;Safety;Behavior;Cognition;Pain;Skin Integrity OT Basic ADL's Functional Problem(s): Bathing;Dressing;Toileting OT Transfers Functional Problem(s): Toilet;Tub/Shower OT Plan OT Intensity: Minimum of 1-2 x/day, 45 to 90 minutes OT Frequency: 5 out of 7 days OT Duration/Estimated Length of Stay: ~2 weeks OT Treatment/Interventions: Balance/vestibular training;Community reintegration;Disease mangement/prevention;Functional electrical stimulation;Neuromuscular re-education;Patient/family education;Self Care/advanced ADL retraining;Therapeutic Exercise;UE/LE Coordination activities;Wheelchair propulsion/positioning;Cognitive remediation/compensation;Discharge planning;DME/adaptive equipment instruction;Functional mobility training;Pain management;Psychosocial support;Skin care/wound managment;Therapeutic Activities;UE/LE Strength taining/ROM;Visual/perceptual remediation/compensation OT Self Feeding Anticipated Outcome(s): Supervision OT Basic Self-Care Anticipated Outcome(s): Min A OT Toileting Anticipated Outcome(s): Min A OT Bathroom Transfers Anticipated Outcome(s): CGA OT Recommendation Follow Up Recommendations: Home health OT Equipment Recommended: To be determined   OT Evaluation Precautions/Restrictions  Precautions Precautions: Fall;Other (comment) Precaution Comments: hx of expressive aphasia, significant fall history! Restrictions Weight Bearing Restrictions: No General   Vital Signs Therapy Vitals Temp: 98.2 F (36.8 C) Temp Source: Oral Pulse Rate: 73 Resp: 16 BP: 120/82 Patient Position (if appropriate): Sitting Oxygen Therapy SpO2: 94 % O2 Device: Room  Air Pain Pain Assessment Pain Scale: 0-10 Pain Score: 0-No pain Home Living/Prior Functioning Home Living Living Arrangements: Spouse/significant other Available Help at Discharge: Family, Available 24 hours/day Type of Home: House Home Access: Stairs to enter [employer] of Steps: threshold (1) Home Layout: One level Bathroom Shower/Tub: Multimedia programmer: Handicapped height Bathroom Accessibility: No Additional Comments: pt reports he uses rollator for mobility in the home but every so often walks without AD and reports having to use RW in bathroom because rollator wont fit -- pt reports that his falls are usually in the bathroom reporting his wife tries to help him but she gets in the way causing him to have LOB  Lives With: Spouse (wife, [name]) IADL History Homemaking Responsibilities: No Current License: No Mode of Transportation: Family Occupation: Retired Prior Function Level of Independence: Independent with gait, Independent with transfers, Requires assistive device for independence, Needs assistance with ADLs Bath: Minimal Dressing: Minimal (assistance with undrwear and footwear, however Pt able to donn pants with supervision) Driving: No Vocation: Retired Surveyor, mining Baseline Vision/History:  (per son report, Pt does not see well out of L eye and recives shots in his R eye. R eye is dominant with baseline visual deficits) Ability to See in Adequate Light: 1 Impaired Patient Visual Report: No change from baseline Vision Assessment?: Yes Eye Alignment: Within Functional Limits Ocular Range of Motion: Within Functional Limits Alignment/Gaze Preference: Within Defined Limits Tracking/Visual Pursuits: Requires cues, head turns, or add eye shifts to track;Impaired - to be further tested in functional context;Decreased smoothness of eye movement to LEFT inferior field Saccades: Within functional limits Convergence: Impaired - to be further tested in  functional context (no double vision reported) Visual Fields: No apparent deficits Depth Perception: Overshoots Perception  Perception: Impaired Spatial Orientation: decreased Praxis Praxis: Impaired Praxis Impairment Details: Ideomotor;Motor planning;Perseveration;Initiation Cognition Cognition Overall Cognitive Status: History of cognitive impairments - at baseline Arousal/Alertness: Awake/alert Orientation Level: Person;Place;Situation Person: Oriented Place: Oriented Situation: Oriented Memory: Impaired Memory Impairment: Storage deficit;Decreased recall of new information;Decreased long term memory;Decreased short term memory (dificult to assess d/t  expresive aphasia) Decreased Long Term Memory: Functional basic;Verbal basic Decreased Short Term Memory: Functional basic;Verbal basic Attention: Focused;Sustained Focused Attention: Appears intact Sustained Attention: Appears intact Selective Attention: Appears intact Awareness: Impaired Awareness Impairment: Emergent impairment (difficult to assess d/t expressive aphasia) Problem Solving: Appears intact (basic problem solving appears to be intact) Executive Function: Self Monitoring;Organizing (difficult to assess d/t aphasia) Organizing: Impaired Organizing Impairment: Functional basic Self Monitoring: Impaired Self Monitoring Impairment: Functional basic Safety/Judgment: Impaired Comments: impaired safety awareness with pt reporting he does not want to use RW despite recurrent falls Brief Interview for Mental Status (BIMS) Repetition of Three Words (First Attempt): 3 (Pt attempted to verbalize 3/3 words; expressive aphasia impacting clarity of speech) Temporal Orientation: Year: Correct Temporal Orientation: Month: Accurate within 5 days Temporal Orientation: Day: Incorrect Recall: "Sock": Yes, no cue required Recall: "Blue": Yes, no cue required Recall: "Bed": No, could not recall BIMS Summary Score:  12 Sensation Sensation Light Touch: Appears Intact Hot/Cold: Appears Intact Proprioception: Impaired by gross assessment Stereognosis: Not tested Coordination Gross Motor Movements are Fluid and Coordinated: No Fine Motor Movements are Fluid and Coordinated: No Coordination and Movement Description: decreased RU/LE coordination Finger Nose Finger Test: dysmetria noted in BUEs with undershooting during assesment Motor  Motor Motor: Hemiplegia;Motor apraxia;Motor perseverations;Abnormal postural alignment and control;Motor impersistence;Ataxia Motor - Skilled Clinical Observations: R hemi LE>UE  Trunk/Postural Assessment  Cervical Assessment Cervical Assessment: Exceptions to [facility] (forward head) Thoracic Assessment Thoracic Assessment: Exceptions to [facility] (rounded shoudlers) Lumbar Assessment Lumbar Assessment: Exceptions to [facility] (posterior pelvic tilt) Postural Control Postural Control: Deficits on evaluation  Balance Balance Balance Assessed: Yes Static Sitting Balance Static Sitting - Balance Support: Feet unsupported Static Sitting - Level of Assistance: 4: Min assist Dynamic Sitting Balance Dynamic Sitting - Balance Support: Feet unsupported Dynamic Sitting - Level of Assistance: 3: Mod assist Dynamic Sitting Balance - Compensations: posterior lean when donning shirt and pants requring mod assitance to maintian dynamic sitting balance Static Standing Balance Static Standing - Balance Support: Bilateral upper extremity supported Static Standing - Level of Assistance: 4: Min assist Dynamic Standing Balance Dynamic Standing - Balance Support: Bilateral upper extremity supported Dynamic Standing - Level of Assistance: 3: Mod assist;4: Min assist Extremity/Trunk Assessment RUE Assessment RUE Assessment: Within Functional Limits Active Range of Motion (AROM) Comments: limited shoudler flexion ~90 degrees General Strength Comments: 4/5 LUE Assessment Active Range of Motion  (AROM) Comments: limited shoudler flexion ~90 degrees General Strength Comments: 4/5  Care Tool Care Tool Self Care Eating  Supervision/Verbal cueing       Oral Care    Oral Care Assist Level: Supervision/Verbal cueing    Bathing   Body parts bathed by patient: Right arm;Left arm;Chest;Abdomen;Right upper leg;Left upper leg;Right lower leg;Left lower leg;Face Body parts bathed by helper: Front perineal area;Buttocks   Assist Level: Moderate Assistance - Patient 50 - 74%    Upper Body Dressing(including orthotics)   What is the patient wearing?: Pull over shirt   Assist Level: Minimal Assistance - Patient > 75%    Lower Body Dressing (excluding footwear)   What is the patient wearing?: Incontinence brief;Pants Assist for lower body dressing: Maximal Assistance - Patient 25 - 49%    Putting on/Taking off footwear   What is the patient wearing?: Shoes;Socks Assist for footwear: Maximal Assistance - Patient 25 - 49%       Care Tool Toileting Toileting activity   Assist for toileting: Maximal Assistance - Patient 25 - 49%     Care Tool Bed  Mobility Roll left and right activity        Sit to lying activity        Lying to sitting on side of bed activity   Lying to sitting on side of bed assist level: the ability to move from lying on the back to sitting on the side of the bed with no back support.: Moderate Assistance - Patient 50 - 74%     Care Tool Transfers Sit to stand transfer   Sit to stand assist level: Moderate Assistance - Patient 50 - 74%    Chair/bed transfer         Toilet transfer   Assist Level: Moderate Assistance - Patient 50 - 74%     Care Tool Cognition  Expression of Ideas and Wants Expression of Ideas and Wants: 2. Frequent difficulty - frequently exhibits difficulty with expressing needs and ideas  Understanding Verbal and Non-Verbal Content Understanding Verbal and Non-Verbal Content: 3. Usually understands - understands most conversations,  but misses some part/intent of message. Requires cues at times to understand   Memory/Recall Ability Memory/Recall Ability : Current season;That he or she is in a hospital/hospital unit   Refer to Care Plan for [place] 1 OT Short Term Goal 1 (Week 1): Pt will maintain dynamic sitting balance EOB 3-5 minutes without UE or trunk support CGA OT Short Term Goal 2 (Week 1): Pt will maitian dynamic standing balance CGA using LRAD OT Short Term Goal 3 (Week 1): Pt will demonstrated increased safety awareness by initaiting use of LRAD during all transfers with min cueing  Recommendations for other services: None    Skilled Therapeutic Intervention Skilled Therapeutic Intervention/Progress Updates:  1:1 OT evaluation and intervention initiated with skilled education provided on OT role, goals, and POC. Pt received resting in bed with son present at room at beginning of OT session providing additional insight to Pt PLOF d/t Pt presenting with expressive aphasia. Pt in good spirits and receptive to session, however politely refusing shower. Pt receptive to completing sponge bath at since with min A provided for UB and Mod A provided for LB d/t balance and attention deficits. Pt presenting with shuffling, step-to gait pattern during functional mobility within room requiring min-mod A for balance with increased challenges noted when crossing thresholds of room to bathroom. Decreased sitting balance noted when completing dressing EOB requiring BUE support and CGA to maintain balance. Decreased standing balance noted during toileting, LB dressing, and sit>stands with posterior lean requiring min-mod A. Pt able to follow VB cues during session to increase safety and independence with BADLs. Pt would benefit from further OT services in IPR setting.   ADL ADL Eating: Supervision/safety Where Assessed-Eating: Edge of bed Grooming: Supervision/safety Where Assessed-Grooming: Sitting  at sink Upper Body Bathing: Minimal assistance Where Assessed-Upper Body Bathing: Sitting at sink Lower Body Bathing: Moderate assistance Where Assessed-Lower Body Bathing: Sitting at sink Upper Body Dressing: Minimal assistance Where Assessed-Upper Body Dressing: Edge of bed Lower Body Dressing: Maximal assistance Where Assessed-Lower Body Dressing: Edge of bed Toileting: Moderate assistance;Maximal assistance Where Assessed-Toileting: Glass blower/designer: Minimal assistance;Moderate assistance (min A using RW) Toilet Transfer Method: Counselling psychologist: Energy manager: Not assessed Social research officer, government: Moderate assistance Social research officer, government Method: Heritage manager: Manufacturing systems engineer  Bed Mobility Bed Mobility: Supine to Sit;Sit to Supine Supine to Sit: Moderate Assistance - Patient 50-74% Sit to Supine: Moderate Assistance - Patient 50-74% Transfers  Sit to Stand: Minimal Assistance - Patient > 75%;Moderate Assistance - Patient 50-74% Stand to Sit: Minimal Assistance - Patient > 75%;Moderate Assistance - Patient 50-74%   Discharge Criteria: Patient will be discharged from OT if patient refuses treatment 3 consecutive times without medical reason, if treatment goals not met, if there is a change in medical status, if patient makes no progress towards goals or if patient is discharged from hospital.  The above assessment, treatment plan, treatment alternatives and goals were discussed and mutually agreed upon: by patient and by family  Janey Genta 03/20/2022, 5:05 PM

## 2022-03-20 NOTE — Patient Care Conference (Cosign Needed Addendum)
Inpatient RehabilitationTeam Conference and Plan of Care Update Date: 03/20/2022   Time: 11:25 AM    Patient Name: Todd Golden.      Medical Record Number: [id number]  Date of Birth: 14-Oct-1930 Sex: Male         Room/Bed: 4W10C/4W10C-01 Payor Info: Payor: [employer] / Plan: [employer] Must PPO / Product Type: *No Product type* /    Admit Date/Time:  03/19/2022  5:32 PM  Primary Diagnosis:  Left middle cerebral artery stroke [facility])  Hospital Problems: Principal Problem:   Left middle cerebral artery stroke [facility])    Expected Discharge Date: Expected Discharge Date:  (evals pending)  Team Members Present: Physician leading conference: Dr. Leeroy Cha Social Worker Present: Ovidio Kin, LCSW Nurse Present: Dorien Chihuahua, RN PT Present: Page Spiro, PT OT Present: Other (comment) Antony Salmon, OT) SLP Present: Helaine Chess, SLP PPS Coordinator present : Gunnar Fusi, SLP     Current Status/Progress Goal Weekly Team Focus  Bowel/Bladder   Continent od B/B. LBM 03/18/22   maintain continence        Swallow/Nutrition/ Hydration               ADL's      Evals pending          Mobility   eval pending           Communication                Safety/Cognition/ Behavioral Observations  Eval pending            Pain   Denies pain.   Remain pain free.   Assess Q4 and prn.    Skin   Intact   Maintain skin integrity.  Assess QS and prn.      Discharge Planning:  new eval-home with wife was falling prior to admission.   Team Discussion: Patient post left MCA CVA with previous stroke history and expressive aphasia that is worse since new CVA.    Patient on target to meet rehab goals: Evals pending  *See Care Plan and progress notes for long and short-term goals.   Revisions to Treatment Plan:    Teaching Needs: Safety, medications, dietary modifications, transfers, toileting, etc.   Current Barriers to  Discharge: Decreased caregiver support and Home enviroment access/layout  Possible Resolutions to Barriers: Family education HH follow up services     Medical Summary Current Status: uncontrolled hypertension, dementia, left MCA CVA, hypokalemia, hypoalbuminemia  Barriers to Discharge: Electrolyte abnormality;Medical stability;Behavior/Mood  Barriers to Discharge Comments: uncontrolled hypertension, dementia, left MCA CVA, hypokalemia, hypoalbuminemia Possible Resolutions to [employer] Focus: add magnesium gluconate '250mg'$  HS, supplement potassium discussed high protein diet, continue amlodipine, continue donepizil   Continued Need for Acute Rehabilitation Level of Care: The patient requires daily medical management by a physician with specialized training in physical medicine and rehabilitation for the following reasons: Direction of a multidisciplinary physical rehabilitation program to maximize functional independence : Yes Medical management of patient stability for increased activity during participation in an intensive rehabilitation regime.: Yes Analysis of laboratory values and/or radiology reports with any subsequent need for medication adjustment and/or medical intervention. : Yes   I attest that I was present, lead the team conference, and concur with the assessment and plan of the team.   Dorien Chihuahua B 03/20/2022, 3:57 PM

## 2022-03-20 NOTE — Evaluation (Signed)
Physical Therapy Assessment and Plan  Patient Details  Name: Todd Golden. MRN: [id number] Date of Birth: [birth year]  PT Diagnosis: Abnormal posture, Abnormality of gait, Cognitive deficits, Coordination disorder, Difficulty walking, Hemiparesis dominant, Impaired cognition, Impaired sensation, and Muscle weakness Rehab Potential: Good ELOS: ~2-2.5 weeks   Today's Date: 03/20/2022 PT Individual Time: [id number] PT Individual Time Calculation (min): 63 min    Hospital Problem: Principal Problem:   Left middle cerebral artery stroke [facility])   Past Medical History:  Past Medical History:  Diagnosis Date   Allergy    Arthritis    Asthma    Cataract    Chronic constipation    Colon polyps    Cough    GERD (gastroesophageal reflux disease)    Glaucoma    HOH (hard of hearing)    a. Uses hearing aids   Hyperlipidemia    Hypertension    Macular degeneration    Nephrolithiasis    stent   PAF (paroxysmal atrial fibrillation) ([place])    a. 11/2013 Dx in setting of L MCA stroke. CHA2DS2VASc = 5-->Eliquis; b. 11/2013 Echo: Nl LV fxn. Mildly dil LA.   Stroke [facility])    a. 11/2013 L MCA stroke.   Ureteral stricture    Past Surgical History:  Past Surgical History:  Procedure Laterality Date   CATARACT EXTRACTION W/PHACO Left 12/20/2014   Procedure: CATARACT EXTRACTION PHACO AND INTRAOCULAR LENS PLACEMENT (IOC);  Surgeon: Birder Robson, MD;  Location: [facility] ORS;  Service: Ophthalmology;  Laterality: Left;   [place]     00:59.6 AP%    21.2 CDE    12.66 fluid pack lot [id number]   JOINT REPLACEMENT Left 2014   knee partial replacement   kidney stent Right 1995    Assessment & Plan Clinical Impression: Patient is a 89 y.o. 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  Per chart review patient lives with spouse.  1 level  home one-step to entry.  Requires intermittent assist for ADLs.  Presented to [facility] 03/15/2022 with acute onset of right-sided weakness.  CT/MRI showed small acute infarct of the superior left frontal lobe.  No acute hemorrhage.  MRA of the head showed no emergent large vessel occlusion.  CT angiogram of the neck negative for large vessel occlusion.  Minimal atherosclerosis.  Admission chemistries unremarkable.  Echocardiogram with ejection fraction of 40 to 45%.  The left ventricle demonstrating global hypokinesis.  Neurology follow-up patient currently remains on low-dose aspirin only for now due to history of falls.  Lovenox added for DVT prophylaxis.  It was recommended follow-up outpatient with neurology services to discuss anticoagulation.  Tolerating a regular consistency diet.  Therapy evaluations completed due to patient decreased functional mobility right side weakness was admitted for a comprehensive rehab program. Currently with uncontrolled HTN. Patient transferred to CIR on 03/19/2022 .   Patient currently requires mod assist with mobility secondary to muscle weakness, decreased cardiorespiratoy endurance, impaired timing and sequencing, unbalanced muscle activation, motor apraxia, and decreased motor planning, decreased visual acuity, decreased midline orientation, decreased initiation, decreased attention, decreased awareness, decreased problem solving, decreased safety awareness, decreased memory, and delayed processing, and decreased sitting balance, decreased standing balance, decreased postural control, and decreased balance strategies.  Prior to hospitalization, patient was modified independent  with mobility and lived with Spouse in a House home.  Home access is threshold (1).  Patient will  benefit from skilled PT intervention to maximize safe functional mobility, minimize fall risk, and decrease caregiver burden for planned discharge home with 24 hour assist.  Anticipate patient will benefit  from follow up [facility] at discharge.  PT - End of Session Activity Tolerance: Tolerates 30+ min activity with multiple rests Endurance Deficit: Yes Endurance Deficit Description: requires seated rest break PT Assessment Rehab Potential (ACUTE/IP ONLY): Good PT Barriers to Discharge: Decreased caregiver support;Behavior;Lack of/limited family support PT Patient demonstrates impairments in the following area(s): Balance;Motor;Safety;Behavior;Nutrition;Sensory;Skin Integrity;Pain;Edema;Endurance;Perception PT Transfers Functional Problem(s): Bed to Chair;Car;Bed Mobility;Furniture;Floor PT Locomotion Functional Problem(s): Ambulation;Stairs;Wheelchair Mobility PT Plan PT Intensity: Minimum of 1-2 x/day ,45 to 90 minutes PT Frequency: 5 out of 7 days PT Duration Estimated Length of Stay: ~2-2.5 weeks PT Treatment/Interventions: Ambulation/gait training;Balance/vestibular training;Cognitive remediation/compensation;Community reintegration;Discharge planning;Disease management/prevention;DME/adaptive equipment instruction;Functional electrical stimulation;Functional mobility training;Neuromuscular re-education;Pain management;Patient/family education;Psychosocial support;Skin care/wound management;Splinting/orthotics;Stair training;Therapeutic Activities;Therapeutic Exercise;UE/LE Strength taining/ROM;UE/LE Coordination activities;Visual/perceptual remediation/compensation;Wheelchair propulsion/positioning PT Transfers Anticipated Outcome(s): CGA using LRAD PT Locomotion Anticipated Outcome(s): CGA using LRAD PT Recommendation Follow Up Recommendations: Home health PT;24 hour supervision/assistance Patient destination: Home Equipment Recommended: To be determined   PT Evaluation Precautions/Restrictions  Precautions Precautions: Fall;Other (comment) Precaution Comments: 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 Does not apply - I have not had any pain or hurting in the past 5 days Pain Interference with Therapy Activities: 1. Rarely or not at all Pain Interference with Day-to-Day Activities: 1. Rarely or not at all Home Living/Prior [place] Available Help at Discharge: Family;Available 24 hours/day Type of Home: House Home Access: Stairs to enter [employer] of Steps: threshold (1) Home Layout: One level Additional Comments: pt reports he uses rollator for mobility in the home but every so often walks without AD and reports having to use RW in bathroom because rollator wont fit  Lives With: Spouse (wife, [name]) Prior Function Level of Independence: Independent with gait;Independent with transfers;Requires assistive device for independence Driving: No Vision/Perception  Vision - History Ability to See in Adequate Light: 1 Impaired Perception Perception: Impaired Spatial Orientation: impaired Praxis Praxis: Impaired Praxis Impairment Details: Motor planning;Initiation  Cognition Overall Cognitive Status: History of cognitive impairments - at baseline Arousal/Alertness: Awake/alert Orientation Level: Oriented X4 (pt with expressive aphasia) Year: 2024 Month: February Day of Week: Correct Attention: Focused;Sustained Focused Attention: Appears intact Sustained Attention: Appears intact Selective Attention: Appears intact Memory: Impaired Awareness Impairment: Impaired  Safety/Judgment: Impaired (comments: impaired safety awareness with pt reporting he does not want to use RW despite recurrent falls) Sensation Sensation Light Touch: Appears Intact (pt becomes frustrated with this testing requiring cuing to continue participating but appears intact to available skin (did not remove shoes due to pt's frustration with the  test)) Hot/Cold: Not tested Proprioception: Impaired by gross assessment Stereognosis: Not tested Coordination Gross Motor Movements are Fluid and Coordinated: No Coordination and Movement Description: impaired due to impaired motor planning, sequencing, and timing of muscle activation Motor  Motor Motor: Hemiplegia;Motor apraxia;Motor perseverations;Abnormal postural alignment and control;Motor impersistence Motor - Skilled Clinical Observations: mild R hemi with impaired motor control/coordination with unbalance and poorly timed motor activation with mild apraxia   Trunk/Postural Assessment  Cervical Assessment Cervical Assessment: Exceptions to [facility] (forward head) Thoracic Assessment Thoracic Assessment: Exceptions to [facility] (rounded shoudlers) Lumbar Assessment Lumbar Assessment: Exceptions to [facility] (posterior pelvic tilt) Postural Control Postural Control:  Deficits on evaluation  Balance Balance Balance Assessed: Yes Static Sitting Balance Static Sitting - Balance Support: Feet unsupported Static Sitting - Level of Assistance: 4: Min assist Dynamic Sitting Balance Dynamic Sitting - Balance Support: Feet unsupported Dynamic Sitting - Level of Assistance: 3: Mod assist Static Standing Balance Static Standing - Balance Support: Bilateral upper extremity supported Static Standing - Level of Assistance: 4: Min assist Dynamic Standing Balance Dynamic Standing - Balance Support: During functional activity;Bilateral upper extremity supported Dynamic Standing - Level of Assistance: 3: Mod assist;4: Min assist Extremity Assessment      RLE Assessment RLE Assessment: Exceptions to [facility] Active Range of Motion (AROM) Comments: WNL ankle DF RLE Strength RLE Overall Strength: Deficits RLE Overall Strength Comments: assessed in sitting Right Hip Flexion: 4/5 Right Knee Flexion: 4+/5 Right Knee Extension: 4+/5 Right Ankle Dorsiflexion: 4/5 Right Ankle Plantar Flexion: 4/5 LLE  Assessment LLE Assessment: Exceptions to [facility] Active Range of Motion (AROM) Comments: mild decreased ankle DF AROM especially when compared to R LE LLE Strength LLE Overall Strength: Deficits LLE Overall Strength Comments: assessed in sitting Left Hip Flexion: 4+/5 Left Knee Flexion: 4+/5 Left Knee Extension: 4+/5 Left Ankle Dorsiflexion: 3+/5 Left Ankle Plantar Flexion: 4-/5  Care Tool Care Tool Bed Mobility Roll left and right activity   Roll left and right assist level: Contact Guard/Touching assist    Sit to lying activity   Sit to lying assist level: Minimal Assistance - Patient > 75%    Lying to sitting on side of bed activity   Lying to sitting on side of bed assist level: the ability to move from lying on the back to sitting on the side of the bed with no back support.: Moderate Assistance - Patient 50 - 74%     Care Tool Transfers Sit to stand transfer   Sit to stand assist level: Moderate Assistance - Patient 50 - 74% Sit to stand assistive device: Walker  Chair/bed transfer   Chair/bed transfer assist level: Moderate Assistance - Patient 50 - 74% Chair/bed transfer assistive device: Proofreader transfer assist level: Moderate Assistance - Patient 50 - 74% Health visitor Comment: RW    Care Tool Locomotion Ambulation   Assist level: Moderate Assistance - Patient 50 - 74% (mod A when LOB occurs) Assistive device: Walker-rolling Max distance: 121f  Walk 10 feet activity   Assist level: Moderate Assistance - Patient - 50 - 74% Assistive device: Walker-rolling   Walk 50 feet with 2 turns activity   Assist level: Moderate Assistance - Patient - 50 - 74% Assistive device: Walker-rolling  Walk 150 feet activity   Assist level: Moderate Assistance - Patient - 50 - 74% Assistive device: Walker-rolling  Walk 10 feet on uneven surfaces activity Walk 10 feet on uneven surfaces activity did not occur:  Safety/medical concerns (fatigue)      Stairs   Assist level: Minimal Assistance - Patient > 75% Stairs assistive device: 2 hand rails Max number of stairs: 8  Walk up/down 1 step activity   Walk up/down 1 step (curb) assist level: Minimal Assistance - Patient > 75% Walk up/down 1 step or curb assistive device: 2 hand rails  Walk up/down 4 steps activity   Walk up/down 4 steps assist level: Minimal Assistance - Patient > 75% Walk up/down 4 steps assistive device: 2 hand rails  Walk up/down 12 steps activity Walk up/down 12 steps activity  did not occur: Safety/medical concerns      Pick up small objects from floor   Pick up small object from the floor assist level: Maximal Assistance - Patient 25 - 49% Pick up small object from the floor assistive device: RW  Wheelchair   Type of Wheelchair: Manual   Wheelchair assist level: Dependent - Patient 0%    Wheel 50 feet with 2 turns activity   Assist Level: Dependent - Patient 0%  Wheel 150 feet activity   Assist Level: Dependent - Patient 0%    Refer to Care Plan for Long Term Goals  SHORT TERM GOAL WEEK 1 PT Short Term Goal 1 (Week 1): Pt will perform supine<>sit with min assist PT Short Term Goal 2 (Week 1): Pt will perform sit<>stand transfers using LRAD with min assist PT Short Term Goal 3 (Week 1): Pt will perform bed<>chair transfers using LRAD with min assist PT Short Term Goal 4 (Week 1): Pt will ambulate at least 27f using LRAD with consistent min assist PT Short Term Goal 5 (Week 1): Pt will participate in a standardized outcome measure to assess his fall risk  Recommendations for other services: None   Skilled Therapeutic Intervention Pt received sitting in recliner and agreeable to therapy session. Evaluation completed (see details above) with patient education regarding purpose of PT evaluation, PT POC and goals, therapy schedule, weekly team meetings, and other CIR information including safety plan and fall risk  safety. Pt performed the below functional mobility tasks with the specified levels of skilled cuing and assistance. Pt appears to have decreased safety awareness with poor insight to deficits and how they place him at increased fall risk. During sit>stand transfers pt with very delayed initiation of movement waiting for therapist to start to lift his hips prior to him initiating movement requiring up to mod assist. During gait training using RW pt with shuffled, Parkinsonian type gait mechanics with short step lengths frequently landing on toes at initial contact causing anterior LOB bias as well as a few instances of "freezing" when turning to go through doorways - therapist providing manual facilitation for longer weight shift onto stance limb to allow sufficient step length with improved heel strike of contralateral LE with improvement but pt unable to sustain when facilitation decreased. During stair navigation training pt with greatest difficulty during ascent frequently having poor foot placement on next step (not placing foot far enough up on step with possible incoordation of movement when trying to put foot in desired location) requiring multimodal cuing to correct for pt safety. At end of session, pt left supine in bed with needs in reach and bed alarm on.  Mobility Bed Mobility Bed Mobility: Supine to Sit;Sit to Supine Supine to Sit: Moderate Assistance - Patient 50-74% Sit to Supine: Minimal Assistance - Patient > 75% Transfers Transfers: Sit to Stand;Stand to Sit;Stand Pivot Transfers Sit to Stand: Minimal Assistance - Patient > 75%;Moderate Assistance - Patient 50-74% Stand to Sit: Minimal Assistance - Patient > 75%;Moderate Assistance - Patient 50-74% Stand Pivot Transfers: Minimal Assistance - Patient > 75%;Moderate Assistance - Patient 50 - 74% Stand Pivot Transfer Details: Tactile cues for initiation;Tactile cues for sequencing;Tactile cues for weight shifting;Tactile cues for  posture;Tactile cues for placement;Verbal cues for technique;Verbal cues for precautions/safety;Verbal cues for safe use of DME/AE;Visual cues for safe use of DME/AE;Visual cues/gestures for sequencing;Verbal cues for sequencing;Manual facilitation for weight shifting Transfer (Assistive device): Rolling walker Locomotion  Gait Ambulation: Yes Gait Assistance: Minimal Assistance - Patient >  75%;Moderate Assistance - Patient 50-74% Gait Distance (Feet): 150 Feet Assistive device: Rolling walker Gait Assistance Details: Verbal cues for technique;Verbal cues for sequencing;Verbal cues for precautions/safety;Verbal cues for gait pattern;Verbal cues for safe use of DME/AE;Manual facilitation for weight shifting;Tactile cues for initiation;Tactile cues for sequencing;Tactile cues for weight shifting;Tactile cues for posture;Tactile cues for placement;Visual cues/gestures for sequencing Gait Gait: Yes Gait Pattern: Impaired Gait Pattern: Decreased step length - right;Decreased step length - left;Decreased stance time - right;Decreased stance time - left;Decreased stride length;Decreased dorsiflexion - left;Decreased weight shift to right;Decreased weight shift to left;Shuffle;Festinating;Poor foot clearance - left;Poor foot clearance - right;Narrow base of support Gait velocity: decreased with frequent anterior LOB Stairs / Additional Locomotion Stairs: Yes Stairs Assistance: Minimal Assistance - Patient > 75% Stair Management Technique: Two rails;Alternating pattern;Step to pattern (varying reciprocal vs step-to - greatest difficulty with ascent due to poor foot placement on step and limited ability to power up through LE to step-up with posterior lean and heavy reliance on B HRs) Number of Stairs: 8 Height of Stairs: 6 Wheelchair Mobility Wheelchair Mobility: No   Discharge Criteria: Patient will be discharged from PT if patient refuses treatment 3 consecutive times without medical reason, if  treatment goals not met, if there is a change in medical status, if patient makes no progress towards goals or if patient is discharged from hospital.  The above assessment, treatment plan, treatment alternatives and goals were discussed and mutually agreed upon: by patient  Tawana Scale , PT, DPT, NCS, CSRS 03/20/2022, 2:26 PM

## 2022-03-20 NOTE — Plan of Care (Signed)
  Problem: RH Balance Goal: LTG Patient will maintain dynamic sitting balance (PT) Description: LTG:  Patient will maintain dynamic sitting balance with assistance during mobility activities (PT) Flowsheets (Taken 03/20/2022 2059) LTG: Pt will maintain dynamic sitting balance during mobility activities with:: Supervision/Verbal cueing Goal: LTG Patient will maintain dynamic standing balance (PT) Description: LTG:  Patient will maintain dynamic standing balance with assistance during mobility activities (PT) Flowsheets (Taken 03/20/2022 2059) LTG: Pt will maintain dynamic standing balance during mobility activities with:: Contact Guard/Touching assist   Problem: Sit to Stand Goal: LTG:  Patient will perform sit to stand with assistance level (PT) Description: LTG:  Patient will perform sit to stand with assistance level (PT) Flowsheets (Taken 03/20/2022 2059) LTG: PT will perform sit to stand in preparation for functional mobility with assistance level: Contact Guard/Touching assist   Problem: RH Bed Mobility Goal: LTG Patient will perform bed mobility with assist (PT) Description: LTG: Patient will perform bed mobility with assistance, with/without cues (PT). Flowsheets (Taken 03/20/2022 2059) LTG: Pt will perform bed mobility with assistance level of: Supervision/Verbal cueing   Problem: RH Bed to Chair Transfers Goal: LTG Patient will perform bed/chair transfers w/assist (PT) Description: LTG: Patient will perform bed to chair transfers with assistance (PT). Flowsheets (Taken 03/20/2022 2059) LTG: Pt will perform Bed to Chair Transfers with assistance level: Contact Guard/Touching assist   Problem: RH Car Transfers Goal: LTG Patient will perform car transfers with assist (PT) Description: LTG: Patient will perform car transfers with assistance (PT). Flowsheets (Taken 03/20/2022 2059) LTG: Pt will perform car transfers with assist:: Contact Guard/Touching assist   Problem: RH  Ambulation Goal: LTG Patient will ambulate in controlled environment (PT) Description: LTG: Patient will ambulate in a controlled environment, # of feet with assistance (PT). Flowsheets (Taken 03/20/2022 2059) LTG: Pt will ambulate in controlled environ  assist needed:: Contact Guard/Touching assist LTG: Ambulation distance in controlled environment: 115f using LRAD Goal: LTG Patient will ambulate in home environment (PT) Description: LTG: Patient will ambulate in home environment, # of feet with assistance (PT). Flowsheets (Taken 03/20/2022 2059) LTG: Pt will ambulate in home environ  assist needed:: Contact Guard/Touching assist LTG: Ambulation distance in home environment: 58fusing LRAD   Problem: RH Stairs Goal: LTG Patient will ambulate up and down stairs w/assist (PT) Description: LTG: Patient will ambulate up and down # of stairs with assistance (PT) Flowsheets (Taken 03/20/2022 2059) LTG: Pt will ambulate up/down stairs assist needed:: Contact Guard/Touching assist LTG: Pt will  ambulate up and down number of stairs: 1 step for home entry

## 2022-03-20 NOTE — Plan of Care (Signed)
  Problem: RH Balance Goal: LTG: Patient will maintain dynamic sitting balance (OT) Description: LTG:  Patient will maintain dynamic sitting balance with assistance during activities of daily living (OT) Flowsheets (Taken 03/20/2022 1730) LTG: Pt will maintain dynamic sitting balance during ADLs with: Supervision/Verbal cueing Goal: LTG Patient will maintain dynamic standing with ADLs (OT) Description: LTG:  Patient will maintain dynamic standing balance with assist during activities of daily living (OT)  Flowsheets (Taken 03/20/2022 1730) LTG: Pt will maintain dynamic standing balance during ADLs with: Contact Guard/Touching assist   Problem: Sit to Stand Goal: LTG:  Patient will perform sit to stand in prep for activites of daily living with assistance level (OT) Description: LTG:  Patient will perform sit to stand in prep for activites of daily living with assistance level (OT) Flowsheets (Taken 03/20/2022 1730) LTG: PT will perform sit to stand in prep for activites of daily living with assistance level: Contact Guard/Touching assist   Problem: RH Grooming Goal: LTG Patient will perform grooming w/assist,cues/equip (OT) Description: LTG: Patient will perform grooming with assist, with/without cues using equipment (OT) Flowsheets (Taken 03/20/2022 1730) LTG: Pt will perform grooming with assistance level of: Supervision/Verbal cueing   Problem: RH Bathing Goal: LTG Patient will bathe all body parts with assist levels (OT) Description: LTG: Patient will bathe all body parts with assist levels (OT) Flowsheets (Taken 03/20/2022 1730) LTG: Pt will perform bathing with assistance level/cueing: Minimal Assistance - Patient > 75% LTG: Position pt will perform bathing: Shower   Problem: RH Dressing Goal: LTG Patient will perform lower body dressing w/assist (OT) Description: LTG: Patient will perform lower body dressing with assist, with/without cues in positioning using equipment  (OT) Flowsheets (Taken 03/20/2022 1730) LTG: Pt will perform lower body dressing with assistance level of: Minimal Assistance - Patient > 75%   Problem: RH Toileting Goal: LTG Patient will perform toileting task (3/3 steps) with assistance level (OT) Description: LTG: Patient will perform toileting task (3/3 steps) with assistance level (OT)  Flowsheets (Taken 03/20/2022 1730) LTG: Pt will perform toileting task (3/3 steps) with assistance level: Minimal Assistance - Patient > 75%   Problem: RH Toilet Transfers Goal: LTG Patient will perform toilet transfers w/assist (OT) Description: LTG: Patient will perform toilet transfers with assist, with/without cues using equipment (OT) Flowsheets (Taken 03/20/2022 1730) LTG: Pt will perform toilet transfers with assistance level of: Contact Guard/Touching assist   Problem: RH Tub/Shower Transfers Goal: LTG Patient will perform tub/shower transfers w/assist (OT) Description: LTG: Patient will perform tub/shower transfers with assist, with/without cues using equipment (OT) Flowsheets (Taken 03/20/2022 1730) LTG: Pt will perform tub/shower stall transfers with assistance level of: Contact Guard/Touching assist   Problem: RH Awareness Goal: LTG: Patient will demonstrate awareness during functional activites type of (OT) Description: LTG: Patient will demonstrate awareness during functional activites type of (OT) Flowsheets (Taken 03/20/2022 1730) Patient will demonstrate awareness during functional activites type of: Emergent LTG: Patient will demonstrate awareness during functional activites type of (OT): Supervision

## 2022-03-20 NOTE — Plan of Care (Signed)
  Problem: RH Expression Communication Goal: LTG Patient will express needs/wants via multi-modal(SLP) Description: LTG:  Patient will express needs/wants via multi-modal communication (gestures/written, etc) with cues (SLP) Flowsheets (Taken 03/20/2022 1647) LTG: Patient will express needs/wants via multimodal communication (gestures/written, etc) with cueing (SLP): Supervision

## 2022-03-20 NOTE — Progress Notes (Signed)
Inpatient Rehabilitation Care Coordinator Assessment and Plan Patient Details  Name: Todd Golden. MRN: [id number] Date of Birth: December 29, 1930  Today's Date: 03/20/2022  Hospital Problems: Principal Problem:   Left middle cerebral artery stroke [facility])  Past Medical History:  Past Medical History:  Diagnosis Date   Allergy    Arthritis    Asthma    Cataract    Chronic constipation    Colon polyps    Cough    GERD (gastroesophageal reflux disease)    Glaucoma    HOH (hard of hearing)    a. Uses hearing aids   Hyperlipidemia    Hypertension    Macular degeneration    Nephrolithiasis    stent   PAF (paroxysmal atrial fibrillation) ([place])    a. 11/2013 Dx in setting of L MCA stroke. CHA2DS2VASc = 5-->Eliquis; b. 11/2013 Echo: Nl LV fxn. Mildly dil LA.   Stroke [facility])    a. 11/2013 L MCA stroke.   Ureteral stricture    Past Surgical History:  Past Surgical History:  Procedure Laterality Date   CATARACT EXTRACTION W/PHACO Left 12/20/2014   Procedure: CATARACT EXTRACTION PHACO AND INTRAOCULAR LENS PLACEMENT (IOC);  Surgeon: Birder Robson, MD;  Location: [facility] ORS;  Service: Ophthalmology;  Laterality: Left;   [place]     00:59.6 AP%    21.2 CDE    12.66 fluid pack lot [id number]   JOINT REPLACEMENT Left 2014   knee partial replacement   kidney stent Right 1995   Social History:  reports that he has never smoked. He has never been exposed to tobacco smoke. He has never used smokeless tobacco. He reports current alcohol use. He reports that he does not use drugs.  Family / Support Systems Marital Status: Married Patient Roles: Spouse, Parent Spouse/Significant Other: [name] [phone] Children: Son and daughter who are local but has issues-daughter is blind and deaf and son is visually impaired Other Supports: Neighbors and church members Anticipated Caregiver: [name] and children can come and check on Ability/Limitations of Caregiver: Wife can do light min and supervision but  is wearing down due to caregiving for pt Caregiver Availability: 24/7 Family Dynamics: Close knit with two children who are involved and will check on, but has health issues of thier own. Close with neighbor who moved away that used to assist with him when he fell at home  Social History Preferred language: English Religion: [employer] Cultural Background: No issues Education: [place] - How often do you need to have someone help you when you read instructions, pamphlets, or other written material from your doctor or pharmacy?: Never Writes: Yes Employment Status: Retired Public relations account executive Issues: No issues Guardian/Conservator: None-according to MD pt is not fully capable of making decisions will look toward his wife and children for any decisions while here.   Abuse/Neglect Abuse/Neglect Assessment Can Be Completed: Yes Physical Abuse: Denies Verbal Abuse: Denies Sexual Abuse: Denies Exploitation of patient/patient's resources: Denies Self-Neglect: Denies  Patient response to: Social Isolation - How often do you feel lonely or isolated from those around you?: Never  Emotional Status Pt's affect, behavior and adjustment status: Pt is motivated to do well and work on his balance while here since falling at home. He uses a rw but at times does not due to bathroom door too narrow so does not use in there. He hopes to be better walking after leaves here Recent Psychosocial Issues: other health issues-dementia, CHF, hx CVA Psychiatric History: History of depression and dementia-takes  medications for both. Feels they help and uses humor to get through life. Will have team evaluate and see if helpful to have neuro-psych see while here Substance Abuse History: No issues  Patient / Family Perceptions, Expectations & Goals Pt/Family understanding of illness & functional limitations: Pt and son can explain his stroke and balance issues-main issue. They both talk with MD's  when rounding and feel their questions and concerns have been addressed. a family member stays here with him when in the hospital. Premorbid pt/family roles/activities: husband, father, retiree, home owner, church member Anticipated changes in roles/activities/participation: resume Pt/family expectations/goals: Pt states: " I need my balance to be better."  Son states: " I hope he can be more steady on his feet when he leaves."  [employer]: None Premorbid Home Care/DME Agencies: Other (Comment) (Had [place] not sure of name will find out from wife-has rollator and rw) Transportation available at discharge: Wife Is the patient able to respond to transportation needs?: Yes In the past 12 months, has lack of transportation kept you from medical appointments or from getting medications?: No In the past 12 months, has lack of transportation kept you from meetings, work, or from getting things needed for daily living?: No  Discharge Planning Living Arrangements: Spouse/significant other Support Systems: Spouse/significant other, Children, Friends/neighbors, Church/faith community Type of Residence: Private residence Insurance Resources: Multimedia programmer (specify) (Health Team [employer]vant[employer]) Financial Resources: Social Security, Family Support Financial Screen Referred: No Living Expenses: Own Money Management: Spouse Does the patient have any problems obtaining your medications?: No Home Management: wife Patient/Family Preliminary Plans: Return home with wife who can provide supervision and light min assist. Children will come and check on them, but limited with what assist they can provide. Daughter is currently looking for assist for them at home. Care Coordinator Barriers to Discharge: Insurance for SNF coverage, Lack of/limited family support Care Coordinator Anticipated Follow Up Needs: HH/OP  Clinical Impression Pleasant gentleman who is stubborn and doesn't always  uses a rolling walker and with balance issues falls at home. His son stays with along with wife while in the hospital due to his dementia. Will await therapy evaluations and work on safest plan for him for discharge.   Elease Hashimoto 03/20/2022, 9:39 AM

## 2022-03-21 LAB — BASIC METABOLIC PANEL
Anion gap: 9 (ref 5–15)
BUN: 13 mg/dL (ref 8–23)
CO2: 24 mmol/L (ref 22–32)
Calcium: 9 mg/dL (ref 8.9–10.3)
Chloride: 104 mmol/L (ref 98–111)
Creatinine, Ser: 1.09 mg/dL (ref 0.61–1.24)
GFR, Estimated: >60 mL/min (ref >60)
Glucose, Bld: 96 mg/dL (ref 70–99)
Potassium: 3.7 mmol/L (ref 3.5–5.1)
Sodium: 137 mmol/L (ref 135–145)

## 2022-03-21 LAB — HOMOCYSTEINE: Homocysteine: 13.6 umol/L (ref 0.0–21.3)

## 2022-03-21 MED ORDER — POTASSIUM CHLORIDE 20 MEQ PO PACK
20.0000 meq | PACK | Freq: Once | ORAL | Status: AC
  Administered 2022-03-21: 20 meq via ORAL
  Filled 2022-03-21: qty 1

## 2022-03-21 MED ORDER — MAGNESIUM GLUCONATE 500 MG PO TABS
250.0000 mg | ORAL_TABLET | Freq: Every day | ORAL | Status: DC
  Administered 2022-03-21 – 2022-03-28 (×8): 250 mg via ORAL
  Filled 2022-03-21 (×8): qty 1

## 2022-03-21 NOTE — Progress Notes (Signed)
Physical Therapy Session Note  Patient Details  Name: Todd Golden. MRN: [id number] Date of Birth: November 07, 1930  Today's Date: 03/21/2022 PT Individual Time: 0800-0845 PT Individual Time Calculation (min): 45 min   Short Term Goals: Week 1:  PT Short Term Goal 1 (Week 1): Pt will perform supine<>sit with min assist PT Short Term Goal 2 (Week 1): Pt will perform sit<>stand transfers using LRAD with min assist PT Short Term Goal 3 (Week 1): Pt will perform bed<>chair transfers using LRAD with min assist PT Short Term Goal 4 (Week 1): Pt will ambulate at least 17f using LRAD with consistent min assist PT Short Term Goal 5 (Week 1): Pt will participate in a standardized outcome measure to assess his fall risk  Skilled Therapeutic Interventions/Progress Updates:      Therapy Documentation Precautions:  Precautions Precautions: Fall, Other (comment) Precaution Comments: 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 Pt declines pain in session. Pt requires min A with supine to sit with use of bed features and mod A for dynamic sitting balance as pt attempted to don socks/shoes, PT provided total A for time management and energy conservation.   Pt requires mod A with sit to stand with RW with verbal and visual cues for hand placement with AD. Pt presents with posterior weightshift and requires verbal cues to weightshift anteriorly to equally distriute weight.   Pt requires min A with gait x 120 ft to dayroom with RW with verbal cues "big" to increase step length. Pt performed BERG balance assessment. Patient demonstrates increased fall risk as noted by score of   8/56 on Berg Balance Scale.  (<36= high risk for falls, close to 100%; 37-45 significant >80%; 46-51 moderate >50%; 52-55 lower >25%)   Standardized  Balance Assessment Standardized Balance Assessment: Berg Balance Test Berg Balance Test Sit to Stand: Needs minimal aid to stand or to stabilize Standing Unsupported: Able to stand 30 seconds unsupported Sitting with Back Unsupported but Feet Supported on Floor or Stool: Able to sit safely and securely 2 minutes Stand to Sit: Needs assistance to sit Transfers: Needs one person to assist Standing Unsupported with Eyes Closed: Needs help to keep from falling Standing Ubsupported with Feet Together: Needs help to attain position and unable to hold for 15 seconds From Standing, Reach Forward with Outstretched Arm: Loses balance while trying/requires external support From Standing Position, Pick up Object from Floor: Unable to try/needs assist to keep balance From Standing Position, Turn to Look Behind Over each Shoulder: Needs assist to keep from losing balance and falling Turn 360 Degrees: Needs assistance while turning Standing Unsupported, Alternately Place Feet on Step/Stool: Needs assistance to keep from falling or unable to try Standing Unsupported, One Foot in Front: Loses balance while stepping or standing Standing on One Leg: Unable to try or needs assist to prevent fall Total Score: 8  Pt ambulated ~200 ft min A with RW with continued verbal cues of "big" as pt unable to recall. Pt left semi-reclined in bed with all needs in reach and alarm on.    Therapy/Group: Individual Therapy  SVerl DickerSVerl DickerPT, DPT  03/21/2022, 7:47 AM

## 2022-03-21 NOTE — Progress Notes (Signed)
Physical Therapy Session Note  Patient Details  Name: Todd Golden. MRN: [id number] Date of Birth: 1930-05-28  Today's Date: 03/21/2022 PT Individual Time: [id number] PT Individual Time Calculation (min): 47 min   Short Term Goals: Week 1:  PT Short Term Goal 1 (Week 1): Pt will perform supine<>sit with min assist PT Short Term Goal 2 (Week 1): Pt will perform sit<>stand transfers using LRAD with min assist PT Short Term Goal 3 (Week 1): Pt will perform bed<>chair transfers using LRAD with min assist PT Short Term Goal 4 (Week 1): Pt will ambulate at least 97f using LRAD with consistent min assist PT Short Term Goal 5 (Week 1): Pt will participate in a standardized outcome measure to assess his fall risk  Skilled Therapeutic Interventions/Progress Updates:    Pt received supine in bed awake with his son, RLiliane Channel present. Pt agreeable to therapy session with min encouragement. Pt's son, RLiliane Channel informs therapist of his visual impairments, resulting in limitations in his ability to assist the patient at D/C. Pt's son expresses concerns regarding pt's poor safety awareness with pt's poor insight into his deficits and how he is at increased risk for falling. Pt becomes slightly irritated by his son expressing his concerns. Informed pt's son of team conf planned for next Wednesday to discuss D/C plan with recommended ELOS in therapy evaluations. Educated pt/son that recommendation would be for pt's wife to come in for a few days of hands-on education/training prior to D/C to ensure safe D/C plan has been made.  Supine>sitting R EOB, HOB partially elevated and using bedrail, with min assist for trunk upright.   Sit<>stands using RW with min assist throughout session - pt continues to have impaired motor planning/sequencing of this task with inconsistent placement of his hand on the AD - improved ability to power up into standing compared to yesterday.  Stand pivot to w/c using RW with min assist  for safety/balance.   Transported to/from gym in w/c for time management and energy conservation.  Gait training ~2667f+ ~8077fsing RW with min assist for balance. Pt demonstrating the following gait deviations with therapist providing the described cuing and facilitation for improvement:  - continues to have shuffled gait, but improved compared to yesterday at initial evaluation  - improved R LE foot clearance and step length with more consistent heel strike compared to L LE - continues to have moments of freezing with festination when turning/changing directions unexpectedly or navigating around obstacles - continued cuing for "big steps" and tactile cuing for longer weight shifting onto stance limb  Pt unable to dual-task while walking, waiting to talk afterwards, which is good because it allows his focus to be on gait, but means that requires a large cognitive demand.  Stair navigation training ascending/descending 4 steps (6" height) using B HRs with CGA/light min assist - reciprocal pattern in both directions with pt demoing decreased posterior lean during descent and improved ability to power up onto next step during ascent. At bottom of steps did have posterior LOB when transitioning hands from HRs to RW requiring min assist to maintain upright.  At end of session, pt left seated in w/c with needs in reach and his son present to assume care of him.  Educated son to ensure nursing staff is made aware when pt wants assistance back to the bed.  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    Therapy/Group: Individual Therapy  Tawana Scale , PT, DPT, NCS, CSRS 03/21/2022, 3:51 PM

## 2022-03-21 NOTE — Progress Notes (Signed)
PROGRESS NOTE   Subjective/Complaints: No new complaints this morning. Tolerating therapy well Requiring MinA with gait with RW Very high fall risk  ROS: +impaired balance   Objective:   No results found. Recent Labs    03/19/22 1755 03/20/22 0657  WBC 5.9 5.4  HGB 12.9* 13.2  HCT 37.5* 38.2*  PLT 217 203   Recent Labs    03/20/22 0657 03/21/22 0541  NA 138 137  K 3.2* 3.7  CL 103 104  CO2 25 24  GLUCOSE 98 96  BUN 12 13  CREATININE 1.11 1.09  CALCIUM 8.9 9.0    Intake/Output Summary (Last 24 hours) at 03/21/2022 1104 Last data filed at 03/21/2022 0700 Gross per 24 hour  Intake 537 ml  Output --  Net 537 ml        Physical Exam: Vital Signs Blood pressure (!) 117/92, pulse 66, temperature (!) 97.5 F (36.4 C), resp. rate 17, height '5\' 11"'$  (1.803 m), weight 79.3 kg, SpO2 100 %. Gen: no distress, normal appearing HEENT: oral mucosa pink and moist, NCAT Cardio: Reg rate Chest: normal effort, normal rate of breathing Abd: soft, non-distended Ext: no edema Psych: pleasant, normal affect Skin: intact Skin: intact Neurological:     Comments: Patient is alert. Contact with examiner.  He can provide his name but not place or month. Poor standing balance. Ambulating MinA with RW  Assessment/Plan: 1. Functional deficits which require 3+ hours per day of interdisciplinary therapy in a comprehensive inpatient rehab setting. Physiatrist is providing close team supervision and 24 hour management of active medical problems listed below. Physiatrist and rehab team continue to assess barriers to discharge/monitor patient progress toward functional and medical goals  Care Tool:  Bathing    Body parts bathed by patient: Right arm, Left arm, Chest, Abdomen, Right upper leg, Left upper leg, Right lower leg, Left lower leg, Face   Body parts bathed by helper: Front perineal area, Buttocks     Bathing assist  Assist Level: Moderate Assistance - Patient 50 - 74%     Upper Body Dressing/Undressing Upper body dressing   What is the patient wearing?: Pull over shirt    Upper body assist Assist Level: Minimal Assistance - Patient > 75%    Lower Body Dressing/Undressing Lower body dressing      What is the patient wearing?: Incontinence brief, Pants     Lower body assist Assist for lower body dressing: Maximal Assistance - Patient 25 - 49%     Toileting Toileting    Toileting assist Assist for toileting: Maximal Assistance - Patient 25 - 49%     Transfers Chair/bed transfer  Transfers assist     Chair/bed transfer assist level: Moderate Assistance - Patient 50 - 74% Chair/bed transfer assistive device: Programmer, multimedia   Ambulation assist      Assist level: Moderate Assistance - Patient 50 - 74% (mod A when LOB occurs) Assistive device: Walker-rolling Max distance: 162f   Walk 10 feet activity   Assist     Assist level: Moderate Assistance - Patient - 50 - 74% Assistive device: Walker-rolling   Walk 50 feet activity   Assist  Assist level: Moderate Assistance - Patient - 50 - 74% Assistive device: Walker-rolling    Walk 150 feet activity   Assist    Assist level: Moderate Assistance - Patient - 50 - 74% Assistive device: Walker-rolling    Walk 10 feet on uneven surface  activity   Assist Walk 10 feet on uneven surfaces activity did not occur: Safety/medical concerns (fatigue)         Wheelchair     Assist Is the patient using a wheelchair?: Yes Type of Wheelchair: Manual    Wheelchair assist level: Dependent - Patient 0%      Wheelchair 50 feet with 2 turns activity    Assist        Assist Level: Dependent - Patient 0%   Wheelchair 150 feet activity     Assist      Assist Level: Dependent - Patient 0%   Blood pressure (!) 117/92, pulse 66, temperature (!) 97.5 F (36.4 C), resp. rate 17,  height '5\' 11"'$  (1.803 m), weight 79.3 kg, SpO2 100 %.  Medical Problem List and Plan: 1. 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  Antithrombotics: -DVT/anticoagulation:  Pharmaceutical: Lovenox             -antiplatelet therapy: Aspirin 81 mg daily 3. Pain Management: Tylenol as needed 4. Mood/Behavior/Sleep: Zoloft 50 mg daily, Aricept 10 mg daily, trazodone as needed             -antipsychotic agents: Provide emotional support 5. Neuropsych/cognition: This patient is not capable of making decisions on his own behalf. 6. Skin/Wound Care: Routine skin checks 7. Fluids/Electrolytes/Nutrition: Routine in and outs with follow-up chemistries 8.  PAF.  Cardiac rate currently controlled.  Plan is to continue aspirin for now follow-up outpatient to discuss anticoagulation 9.  Hypertension.  Norvasc 5 mg daily, Cardura 4 mg daily, Visken 10 mg daily.  Monitor with increased mobility. Add magnesium gluconate '250mg'$  HS 10.  Hyperlipidemia.  Continue Crestor 11.  GERD.  Continue Protonix 12. Hypoalbuminemia: encouraged high protein diet.  13. Hypokalemia: continue to supplement BID    LOS: 2 days A FACE TO FACE EVALUATION WAS PERFORMED  Todd Golden P [name] 03/21/2022, 11:04 AM

## 2022-03-21 NOTE — Progress Notes (Signed)
Patient ID: Todd Golden., male   DOB: Nov 17, 1930, 89 y.o.   MRN: [id number] Met with the patient to review current situation, rehab process, team conference and plan of care. Patient noted he is ready to go home; wife can help him. Patient with expressive aphasia but able to communicate needs. Noted he managed medications PTA using a pill box, weakness does not hinder him. Reviewed medications and dietary modifications. Continue to follow along to address educational needs to facilitate preparation for discharge. Margarito Liner

## 2022-03-21 NOTE — Progress Notes (Signed)
Occupational Therapy Session Note  Patient Details  Name: Todd Golden. MRN: [id number] Date of Birth: 20-Oct-1930  Today's Date: 03/21/2022 OT Individual Time: [id number] OT Individual Time Calculation (min): 71 min    Short Term Goals: Week 1:  OT Short Term Goal 1 (Week 1): Pt will maintain dynamic sitting balance EOB 3-5 minutes without UE or trunk support CGA OT Short Term Goal 2 (Week 1): Pt will maitian dynamic standing balance CGA using LRAD OT Short Term Goal 3 (Week 1): Pt will demonstrated increased safety awareness by initaiting use of LRAD during all transfers with min cueing  Skilled Therapeutic Interventions/Progress Updates:  Pt greeted in bathroom with NT present. Pt needed increased time for toileting however no urine or bowel void noted. Total A to don new brief from toilet, pt did assist with pulling pants back to waist line with overall MODA. Pt completed functional mobility out of bathroom with Rw with MIN- MODA. Pt declined sitting in w/c gesturing to bed. 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 Pt making a lot of gestures seeming to gesture about typing. Inferred that pt referring to previous therapists typing a lot.   Pt completed stand pivot to w/c with Rw and MINA. Total A transport to gym in w/c with total A for time mgmt. Pt completed standing therapeutic activity where pt instructed to use RUE to match colored cones to colored dots on table to further assess RUE coordination as well as challenge standing balance/tolerance. Pt completed task with CGA, mildly impaired RUE coordination when completing targeted reach but overall functional. Emphasis on word finding during task with pt asked to state colors however noted to become frustrated by therapist asking him to state colors even though with 2 choices provided pt able to state correct color ~ 70% of the time.   Graded task up and had pt place correct  number of bugs on numbered dots with an emphasis on naming numbers, pt needed more assist with this task, overall MODA as pt noted to have more impaired [place] in RUE duirng this task, dropping items in RUE and miscounting items, but overall pt stood to complete both tasks for ~ 7 mins.   Pt requested to work on functional mobility with pt completed functional ambulation with Rw with MINA >126f with close chair follow, pt noted to drag RLE during gait need MAX cues to increase swing. Pt gesturing that therapist was going too slow however education provided on safety awareness.    Worked on seated and standing BLEs therex for NMR to facilitate improved strength/endurance in RLE for higher level functional mobility tasks. Pt completed seated LAQs with 5 lb ankle weighted donned for 3 sets of 10. Pt also completed seated marches with 5 lb ankle weighted donned for 3 sets of 10. Graded task up and had pt complete same marches in standing with 5 lb weighted 3x10 reps.     Box and Blocks Test measures unilateral gross manual dexterity. - Instructions The pt was instructed to carry one block over at a time and go as quickly as they could, making sure their fingertips crossed the partition. One minute was given to complete the task per UE. The pt was allowed a 15-second trial period prior to testing if needed. - Results The pt transferred 16 blocks with the R hand and 18 with the L hand. The total number of blocks carried from one compartment to the other  in one minute is scored per hand. Higher scores on the test indicate better gross manual dexterity.  - Norms for adults males 50-75+ -50-54 R 79 L 77.0 -55-59 R 75.2 L 73.8 -60-64 R 71.3 L 70.5 -65-69 R 68.5 L 67.4 -70-74 R 66.3 L 64.33 -75+ R 63.0 L 61.3 Of note pt with decreased ability to follow standardized commands during assessment such as only picking up one block at a time especially when completing with task with LUE.   Ended session  with pt seated in recliner with chair alarm activated and all needs within reach.     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

## 2022-03-21 NOTE — Progress Notes (Signed)
Speech Language Pathology Daily Session Note  Patient Details  Name: Todd Golden. MRN: [id number] Date of Birth: 11-19-1930  Today's Date: 03/21/2022 SLP Individual Time: 1330-1400 SLP Individual Time Calculation (min): 30 min  Short Term Goals: Week 1: SLP Short Term Goal 1 (Week 1): Patient will exhibit understanding of education on communication strategies by verbalizing and demonstrating use of at least 2 strategies following education with sup A verbal cues for effectiveness SLP Short Term Goal 2 (Week 1): SLP will communicate with pt's spouse to obtain further insight into pt's PLOF to further establish ST POC and/or determine appropriateness to discharge from services.  Skilled Therapeutic Interventions: Pt seen this date for skilled ST intervention targeting communication and further assessment goals outlined in care plan. Pt received awake/alert and getting back in bed with NT present; recently used the bathroom. Agreeable to intervention at bedside.   SLP facilitated today's session by providing overall Min A verbal cues to assist pt in answering simple, biographical questions. Pt answered questions with 100% accuracy given aforementioned level of cueing. Benefited from being provided with yes/no questions and binary verbal choice prompts. Pt utilizing gestures independently to assist in communication of verbal messages. Participated in confrontation naming task of common objects in room with ~70% accuracy at the Sup A level. Spoke with pt's wife over the phone who states she feels he is at his cognitive-linguistic baseline, though reports difficulty with communication at home, and would like additional education on communication strategies. Will plan to provide next session and then sign off given reports of pt being at baseline - pt in agreement.  Pt left in room and in bed with all safety measures activated, call bell within reach, and all immediate needs met. Continue per  current ST POC.  Pain No pain reported; NAD  Therapy/Group: Individual Therapy  Shamela Haydon A Lewanda Perea 03/21/2022, 3:37 PM

## 2022-03-22 NOTE — Progress Notes (Signed)
Occupational Therapy Session Note  Patient Details  Name: Todd Golden. MRN: [id number] Date of Birth: 01-Jan-1931  Today's Date: 03/22/2022 OT Individual Time: 1010-1105 OT Individual Time Calculation (min): 55 min    Short Term Goals: Week 1:  OT Short Term Goal 1 (Week 1): Pt will maintain dynamic sitting balance EOB 3-5 minutes without UE or trunk support CGA OT Short Term Goal 2 (Week 1): Pt will maitian dynamic standing balance CGA using LRAD OT Short Term Goal 3 (Week 1): Pt will demonstrated increased safety awareness by initaiting use of LRAD during all transfers with min cueing  Skilled Therapeutic Interventions/Progress Updates:     Pt received in bed with no pain. Pt with minimal frustration tolerance.   ADL: OT installs elastic laces in to shoes. Pt able to don with S-MIN A at EOB. Pt practices DAL apartment transfers: recliner, toilet, and bed. Pt needs MOD A to power up from recliner otherwise CGA-MIN A for rest of transfers and bed mobility. Pt with improved RW use/management with gait belt on RW for visual cue to stay close to walker as opposed to pushing it far in front of pt. VC for large steps and pt often shuffling feet during mobility.   Therapeutic activity Pt completes 3x30 ball toss (chest, bounce, overhead pass) in seated position with 1 # wrist weights to improve BUE coordination/strengthening required for BADLs/functional transfers. Pt mildly frustrated with OT forced use of LUE for throwing ball. Educated on use for [employer].  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

## 2022-03-22 NOTE — Plan of Care (Signed)
  Problem: RH Expression Communication Goal: LTG Patient will express needs/wants via multi-modal(SLP) Description: LTG:  Patient will express needs/wants via multi-modal communication (gestures/written, etc) with cues (SLP) Outcome: Completed/Met

## 2022-03-22 NOTE — Progress Notes (Signed)
PROGRESS NOTE   Subjective/Complaints: No new complaints this morning Happy to have received a shower this morning Therapy has no new concerns  ROS: +impaired balance, +decreased safety awareness as per therapy   Objective:   No results found. Recent Labs    03/19/22 1755 03/20/22 0657  WBC 5.9 5.4  HGB 12.9* 13.2  HCT 37.5* 38.2*  PLT 217 203   Recent Labs    03/20/22 0657 03/21/22 0541  NA 138 137  K 3.2* 3.7  CL 103 104  CO2 25 24  GLUCOSE 98 96  BUN 12 13  CREATININE 1.11 1.09  CALCIUM 8.9 9.0    Intake/Output Summary (Last 24 hours) at 03/22/2022 1051 Last data filed at 03/22/2022 0928 Gross per 24 hour  Intake 720 ml  Output 150 ml  Net 570 ml        Physical Exam: Vital Signs Blood pressure 116/85, pulse 90, temperature 98.6 F (37 C), resp. rate 16, height '5\' 11"'$  (1.803 m), weight 79.3 kg, SpO2 99 %. Gen: no distress, normal appearing, BMI 24.38 HEENT: oral mucosa pink and moist, NCAT Cardio: Reg rate Chest: normal effort, normal rate of breathing Abd: soft, non-distended Ext: no edema Psych: pleasant, normal affect Skin: intact Neurological:     Comments: Patient is alert. Contact with examiner.  He can provide his name but not place or month. Poor standing balance. Ambulating MinA with RW  Assessment/Plan: 1. Functional deficits which require 3+ hours per day of interdisciplinary therapy in a comprehensive inpatient rehab setting. Physiatrist is providing close team supervision and 24 hour management of active medical problems listed below. Physiatrist and rehab team continue to assess barriers to discharge/monitor patient progress toward functional and medical goals  Care Tool:  Bathing    Body parts bathed by patient: Right arm, Left arm, Chest, Abdomen, Right upper leg, Left upper leg, Right lower leg, Left lower leg, Face   Body parts bathed by helper: Front perineal area,  Buttocks     Bathing assist Assist Level: Moderate Assistance - Patient 50 - 74%     Upper Body Dressing/Undressing Upper body dressing   What is the patient wearing?: Pull over shirt    Upper body assist Assist Level: Minimal Assistance - Patient > 75%    Lower Body Dressing/Undressing Lower body dressing      What is the patient wearing?: Incontinence brief, Pants     Lower body assist Assist for lower body dressing: Maximal Assistance - Patient 25 - 49%     Toileting Toileting    Toileting assist Assist for toileting: Maximal Assistance - Patient 25 - 49%     Transfers Chair/bed transfer  Transfers assist     Chair/bed transfer assist level: Moderate Assistance - Patient 50 - 74% Chair/bed transfer assistive device: Programmer, multimedia   Ambulation assist      Assist level: Moderate Assistance - Patient 50 - 74% (mod A when LOB occurs) Assistive device: Walker-rolling Max distance: 160f   Walk 10 feet activity   Assist     Assist level: Moderate Assistance - Patient - 50 - 74% Assistive device: Walker-rolling   Walk 50 feet  activity   Assist    Assist level: Moderate Assistance - Patient - 50 - 74% Assistive device: Walker-rolling    Walk 150 feet activity   Assist    Assist level: Moderate Assistance - Patient - 50 - 74% Assistive device: Walker-rolling    Walk 10 feet on uneven surface  activity   Assist Walk 10 feet on uneven surfaces activity did not occur: Safety/medical concerns (fatigue)         Wheelchair     Assist Is the patient using a wheelchair?: Yes Type of Wheelchair: Manual    Wheelchair assist level: Dependent - Patient 0%      Wheelchair 50 feet with 2 turns activity    Assist        Assist Level: Dependent - Patient 0%   Wheelchair 150 feet activity     Assist      Assist Level: Dependent - Patient 0%   Blood pressure 116/85, pulse 90, temperature 98.6 F (37  C), resp. rate 16, height '5\' 11"'$  (1.803 m), weight 79.3 kg, SpO2 99 %.  Medical Problem List and Plan: 1. 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  Antithrombotics: -DVT/anticoagulation:  Pharmaceutical: Lovenox             -antiplatelet therapy: Aspirin 81 mg daily 3. Pain Management: Tylenol as needed 4. Mood/Behavior/Sleep: Zoloft 50 mg daily, Aricept 10 mg daily, trazodone as needed             -antipsychotic agents: Provide emotional support 5. Neuropsych/cognition: This patient is not capable of making decisions on his own behalf. 6. Skin/Wound Care: Routine skin checks 7. Fluids/Electrolytes/Nutrition: Routine in and outs with follow-up chemistries 8.  PAF.  Cardiac rate currently controlled.  Plan is to continue aspirin for now follow-up outpatient to discuss anticoagulation 9.  Hypertension.  Continue Norvasc 5 mg daily, Cardura 4 mg daily, Visken 10 mg daily.  Monitor with increased mobility. Add magnesium gluconate '250mg'$  HS 10.  Hyperlipidemia.  Continue Crestor 11.  GERD.  Continue Protonix 12. Hypoalbuminemia: encouraged high protein diet.  13. Hypokalemia: received supplementation, repeat level Monday.     LOS: 3 days A FACE TO FACE EVALUATION WAS PERFORMED  Todd Golden Todd Golden 03/22/2022, 10:51 AM

## 2022-03-22 NOTE — Progress Notes (Addendum)
Speech Language Pathology Discharge Summary  Patient Details  Name: Todd Golden. MRN: [id number] Date of Birth: 07-24-1930  Date of Discharge from [facility] service:March 22, 2022  Today's Date: 03/22/2022 SLP Individual Time:  -      Skilled Therapeutic Interventions:    Pt seen this date for skilled ST intervention targeting functional communication goals outlined in care plan + pt and family education. Pt received lying supine in bed with eyes closed; recently finished with toileting needs per NT during hand off. Wife present for education, upon her request, given reports of frequent communication breakdown PTA. Reports he received ST intervention ~2 years ago in OP venue and received 2 [place] visits (at that time, no education provided on supportive communication interventions, per pt and pt's wife). They endorse progressive decline in speech, language, and cognition noted over the past year.   Today's session with emphasis on functional communication and pt + family education. SLP provided skilled education re: supportive communication interventions and environmental modifications to support individuals with aphasia with auditory comprehension and verbal expression. Pt and pt's wife were active participants in education, with pt's wife demonstrating understanding of education during discussion with pt this date. Handouts provided to reinforce strategies. Initially, pt was not engaged in discussion due to fatigue; however, with time and encouragement, pt began to participate.   During discussion of personal information and communication of ideas, thoughts, wants, and needs, pt benefited from overall Sup A and extended processing time to facilitate word finding - utilized use of gestures and synonyms independently with SLP providing positive reinforcement for use of these word finding strategies to aid in comprehension of verbal communication. Pt states that his words are difficult to get out at  times; however, does not want to trial low-tech nor high-tech AAC devices at this time. Pt does benefit from supportive communication interventions and responds best when given yes/no + close-ended questions, binary choice prompts, use of gestures, simple + concise directives, and extended processing time. Naming hierarchy cues appear somewhat beneficial during instances of communication breakdown. Pt and pt's wife appreciative of education.   Pt left in room and in bed with all safety measures activated, call bell within reach, and all immediate needs met. Direct hand off to CSW. Pt has met all long-term goals and will be discharged this date - at baseline per pt and family. Please see below for d/c summary.  Patient has met 1 of 1 long term goals.  Patient to discharge at overall Supervision level.  Reasons goals not met: N/A   Clinical Impression/Discharge Summary:    Pt has met long-term goals outlined in care plan. Pt and pt's wife endorse that pt's communication and cognitive functioning are impaired; however, are consistent with baseline at this time. Education completed and handouts provided to reinforce information; all questions answered with pt's wife verbalizing education to be helpful. Wife to provide supervision and assistance at home upon d/c. No f/u ST intervention indicated at this time. Suspect cognitive-linguistic skills will continue to decline (given reports of progressive decline over the last year) and will likely continue to require assistance and supervision indefinitely.   Care Partner:  Caregiver Able to Provide Assistance: Yes  Type of Caregiver Assistance: Physical;Cognitive  Recommendation:  24 hour supervision/assistance    Equipment: N/A   Reasons for discharge: Treatment goals met   Patient/Family Agrees with Progress Made and Goals Achieved: Yes    Silvano Garofano A Shaquitta Burbridge 03/22/2022, 6:20 PM

## 2022-03-22 NOTE — Progress Notes (Signed)
Occupational Therapy Session Note  Patient Details  Name: Todd Golden. MRN: [id number] Date of Birth: 11-02-1930  Today's Date: 03/22/2022 OT Individual Time: [id number] OT Individual Time Calculation (min): 30 min    Short Term Goals: Week 1:  OT Short Term Goal 1 (Week 1): Pt will maintain dynamic sitting balance EOB 3-5 minutes without UE or trunk support CGA OT Short Term Goal 2 (Week 1): Pt will maitian dynamic standing balance CGA using LRAD OT Short Term Goal 3 (Week 1): Pt will demonstrated increased safety awareness by initaiting use of LRAD during all transfers with min cueing  Skilled Therapeutic Interventions/Progress Updates:    Pt greeted semi-reclined in bed with son present and agreeable to OT treatment session. Son left for remainder of therapy session. Pt agreeable to shower. Pt completed bed mobility with min A. He then ambulated to the bathroom w/ RW and min A with cues for safety and proximity to the tub bench. Bathing completed with overall CGA when standing to wash his buttocks. Stand-pivot out of shower with CGA. OT educated on modifications for LB dressing. He needed encouragement to keep trying to pull his pants up himself, min A overall. Pt able to get B LE's into figure 4 position to help don socks. Pt then requested to return to bed in between therapy session. Stand-pivot to EOB with min A. Pt left semi-reclined in bed with bed alarm on, call bell in reach, and needs met.   Therapy Documentation Precautions:  Precautions Precautions: Fall, Other (comment) Precaution Comments: hx of expressive>receptive aphasia, significant fall history!, poor safety awareness Restrictions Weight Bearing Restrictions: No  Pain:  Denies pain   Therapy/Group: Individual Therapy  Valma Cava 03/22/2022, 9:17 AM

## 2022-03-22 NOTE — IPOC Note (Addendum)
Overall Plan of Care [facility]) Patient Details Name: Todd Golden. MRN: [id number] DOB: 1930/06/16  Admitting Diagnosis: Left middle cerebral artery stroke [facility])  Hospital Problems: Principal Problem:   Left middle cerebral artery stroke [facility])     Functional Problem List: Nursing Bladder, Safety, Endurance, Medication Management  PT Balance, Motor, Safety, Behavior, Nutrition, Sensory, Skin Integrity, Pain, Edema, Endurance, Perception  OT Balance, Endurance, Perception, Vision, Motor, Safety, Behavior, Cognition, Pain, Skin Integrity  SLP Linguistic, Motor  TR         Basic ADL's: OT Bathing, Dressing, Toileting     Advanced  ADL's: OT       Transfers: PT Bed to Chair, Car, Bed Mobility, Furniture, Floor  OT Toilet, Metallurgist: PT Ambulation, Stairs, Wheelchair Mobility     Additional Impairments: OT    SLP Communication expression, comprehension    TR      Anticipated Outcomes Item Anticipated Outcome  Self Feeding Supervision  Swallowing  N/A   Basic self-care  Min A  Toileting  Min A   Bathroom Transfers CGA  Bowel/Bladder  manage bladder w toileting  Transfers  CGA using LRAD  Locomotion  CGA using LRAD  Communication  sup A  Cognition  N/A  Pain  n/a  Safety/Judgment  manage w cues   Therapy Plan: PT Intensity: Minimum of 1-2 x/day ,45 to 90 minutes PT Frequency: 5 out of 7 days PT Duration Estimated Length of Stay: ~2-2.5 weeks OT Intensity: Minimum of 1-2 x/day, 45 to 90 minutes OT Frequency: 5 out of 7 days OT Duration/Estimated Length of Stay: ~2 weeks SLP Intensity: Minumum of 1-2 x/day, 30 to 90 minutes SLP Frequency: 1 to 3 out of 7 days SLP Duration Estimated Length of Stay: ~1 week   Team Interventions: Nursing Interventions Bladder Management, Medication Management, Discharge Planning, Disease Management/Prevention, Patient/Family Education  PT interventions Ambulation/gait training, Human resources officer, Cognitive remediation/compensation, Community reintegration, Discharge planning, Disease management/prevention, DME/adaptive equipment instruction, Functional electrical stimulation, Functional mobility training, Neuromuscular re-education, Pain management, Patient/family education, Psychosocial support, Skin care/wound management, Splinting/orthotics, Stair training, Therapeutic Activities, Therapeutic Exercise, UE/LE Strength taining/ROM, UE/LE Coordination activities, Visual/perceptual remediation/compensation, Wheelchair propulsion/positioning  OT Interventions Training and development officer, Community reintegration, Disease mangement/prevention, Technical sales engineer stimulation, Neuromuscular re-education, Patient/family education, Self Care/advanced ADL retraining, Therapeutic Exercise, UE/LE Coordination activities, Wheelchair propulsion/positioning, Cognitive remediation/compensation, Discharge planning, DME/adaptive equipment instruction, Functional mobility training, Pain management, Psychosocial support, Skin care/wound managment, Therapeutic Activities, UE/LE Strength taining/ROM, Visual/perceptual remediation/compensation  SLP Interventions Patient/family education  TR Interventions    SW/CM Interventions Discharge Planning, Psychosocial Support, Patient/Family Education   Barriers to Discharge MD  Medical stability  Nursing Decreased caregiver support 1 level 1 ste w spouse; has RW and rollator  PT Decreased caregiver support, Behavior, Lack of/limited family support    OT      SLP      SW Insurance for SNF coverage, Lack of/limited family support     Team Discharge Planning: Destination: PT-Home ,OT-Home   , SLP-Home Projected Follow-up: PT-Home health PT, 24 hour supervision/assistance, OT-  Home health OT, SLP-24 hour supervision/assistance Projected Equipment Needs: PT-To be determined, OT- To be determined, SLP-None recommended by SLP Equipment Details: PT- , OT-   Patient/family involved in discharge planning: PT- Patient,  OT-Patient, Family member/caregiver, SLP-Patient  MD ELOS: 10-14 days Medical Rehab Prognosis:  Excellent Assessment: The patient has been admitted for CIR therapies with the diagnosis of The team will be addressing functional mobility, strength, stamina, balance,  safety, adaptive techniques and equipment, self-care, bowel and bladder mgt, patient and caregiver education. Goals have been set at supervision. Anticipated discharge destination is home.         See Team Conference Notes for weekly updates to the plan of care

## 2022-03-22 NOTE — Progress Notes (Signed)
Patient ID: Todd Golden., male   DOB: 1931-01-02, 89 y.o.   MRN: [id number]  Met with pt and wife who is present and has questions. She asked how long he would be here and what services he may be eligible for. Discussed ELOS 2 weeks and pt was not happy about this he hopes it will be shorter. Since it is so loud here compared to home and being quiet. Discussed needing to get him to a level wife can manage him. She is looking into hiring some assist at home and is aware it will be private pay. She did say he had home health and this was helpful and would like the same agency again. Will see if can figure out who went out in Jan 2024. Continue to work on discharge needs. Pt reports he will be patient with [place] and stay to get rehab.

## 2022-03-22 NOTE — Progress Notes (Signed)
Physical Therapy Session Note  Patient Details  Name: Todd Golden. MRN: [id number] Date of Birth: 03-14-1930  Today's Date: 03/22/2022 PT Missed Time: 82 Minutes Missed Time Reason: Patient fatigue  Short Term Goals: Week 1:  PT Short Term Goal 1 (Week 1): Pt will perform supine<>sit with min assist PT Short Term Goal 2 (Week 1): Pt will perform sit<>stand transfers using LRAD with min assist PT Short Term Goal 3 (Week 1): Pt will perform bed<>chair transfers using LRAD with min assist PT Short Term Goal 4 (Week 1): Pt will ambulate at least 51f using LRAD with consistent min assist PT Short Term Goal 5 (Week 1): Pt will participate in a standardized outcome measure to assess his fall risk  Skilled Therapeutic Interventions/Progress Updates:     Pt asleep upon PT entry. Wife says pt has been very fatigued and likely needs rest. PT will follow up as able. Pt misses 45 minutes of skilled PT.   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

## 2022-03-23 MED ORDER — SENNOSIDES-DOCUSATE SODIUM 8.6-50 MG PO TABS
2.0000 | ORAL_TABLET | Freq: Two times a day (BID) | ORAL | Status: DC
  Administered 2022-03-23 – 2022-03-29 (×11): 2 via ORAL
  Filled 2022-03-23 (×12): qty 2

## 2022-03-23 MED ORDER — BISACODYL 10 MG RE SUPP
10.0000 mg | Freq: Once | RECTAL | Status: AC
  Administered 2022-03-23: 10 mg via RECTAL
  Filled 2022-03-23: qty 1

## 2022-03-23 NOTE — Progress Notes (Signed)
PROGRESS NOTE   Subjective/Complaints:  Was up laast night with constipation , only had small BM  ROS: +impaired balance, +decreased safety awareness as per therapy   Objective:   No results found. No results for input(s): "WBC", "HGB", "HCT", "PLT" in the last 72 hours.  Recent Labs    03/21/22 0541  NA 137  K 3.7  CL 104  CO2 24  GLUCOSE 96  BUN 13  CREATININE 1.09  CALCIUM 9.0     Intake/Output Summary (Last 24 hours) at 03/23/2022 0834 Last data filed at 03/23/2022 0725 Gross per 24 hour  Intake 420 ml  Output 352 ml  Net 68 ml         Physical Exam: Vital Signs Blood pressure 127/87, pulse 88, temperature 98.2 F (36.8 C), temperature source Oral, resp. rate 18, height '5\' 11"'$  (1.803 m), weight 79.3 kg, SpO2 99 %.  General: No acute distress Mood and affect are appropriate Heart: Regular rate and rhythm no rubs murmurs or extra sounds Lungs: Clear to auscultation, breathing unlabored, no rales or wheezes Abdomen: Positive bowel sounds, soft nontender to palpation, nondistended Extremities: No clubbing, cyanosis, or edema Skin: No evidence of breakdown, no evidence of rash Neurologi motor strength is 5/5 in bilateral deltoid, bicep, tricep, grip, hip flexor, knee extensors, ankle dorsiflexor and plantar flexor Able to follow commands and provide yes and no answers or head nods   Musculoskeletal: Full range of motion in all 4 extremities. No joint swelling  Neurological:     Comments: Patient is alert. Contact with examiner.  He can provide his name but not place or month. Poor standing balance. Ambulating MinA with RW  Assessment/Plan: 1. Functional deficits which require 3+ hours per day of interdisciplinary therapy in a comprehensive inpatient rehab setting. Physiatrist is providing close team supervision and 24 hour management of active medical problems listed below. Physiatrist and rehab team  continue to assess barriers to discharge/monitor patient progress toward functional and medical goals  Care Tool:  Bathing    Body parts bathed by patient: Right arm, Left arm, Chest, Abdomen, Right upper leg, Left upper leg, Right lower leg, Left lower leg, Face   Body parts bathed by helper: Front perineal area, Buttocks     Bathing assist Assist Level: Moderate Assistance - Patient 50 - 74%     Upper Body Dressing/Undressing Upper body dressing   What is the patient wearing?: Pull over shirt    Upper body assist Assist Level: Minimal Assistance - Patient > 75%    Lower Body Dressing/Undressing Lower body dressing      What is the patient wearing?: Incontinence brief, Pants     Lower body assist Assist for lower body dressing: Maximal Assistance - Patient 25 - 49%     Toileting Toileting    Toileting assist Assist for toileting: Maximal Assistance - Patient 25 - 49%     Transfers Chair/bed transfer  Transfers assist     Chair/bed transfer assist level: Moderate Assistance - Patient 50 - 74% Chair/bed transfer assistive device: Programmer, multimedia   Ambulation assist      Assist level: Moderate Assistance - Patient 50 -  74% (mod A when LOB occurs) Assistive device: Walker-rolling Max distance: 154f   Walk 10 feet activity   Assist     Assist level: Moderate Assistance - Patient - 50 - 74% Assistive device: Walker-rolling   Walk 50 feet activity   Assist    Assist level: Moderate Assistance - Patient - 50 - 74% Assistive device: Walker-rolling    Walk 150 feet activity   Assist    Assist level: Moderate Assistance - Patient - 50 - 74% Assistive device: Walker-rolling    Walk 10 feet on uneven surface  activity   Assist Walk 10 feet on uneven surfaces activity did not occur: Safety/medical concerns (fatigue)         Wheelchair     Assist Is the patient using a wheelchair?: Yes Type of Wheelchair: Manual     Wheelchair assist level: Dependent - Patient 0%      Wheelchair 50 feet with 2 turns activity    Assist        Assist Level: Dependent - Patient 0%   Wheelchair 150 feet activity     Assist      Assist Level: Dependent - Patient 0%   Blood pressure 127/87, pulse 88, temperature 98.2 F (36.8 C), temperature source Oral, resp. rate 18, height '5\' 11"'$  (1.803 m), weight 79.3 kg, SpO2 99 %.  Medical Problem List and Plan: 1. 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  Antithrombotics: -DVT/anticoagulation:  Pharmaceutical: Lovenox             -antiplatelet therapy: Aspirin 81 mg daily 3. Pain Management: Tylenol as needed 4. Mood/Behavior/Sleep: Zoloft 50 mg daily, Aricept 10 mg daily, trazodone as needed             -antipsychotic agents: Provide emotional support 5. Neuropsych/cognition: This patient is not capable of making decisions on his own behalf. 6. Skin/Wound Care: Routine skin checks 7. Fluids/Electrolytes/Nutrition: Routine in and outs with follow-up chemistries 8.  PAF.  Cardiac rate currently controlled.  Plan is to continue aspirin for now follow-up outpatient to discuss anticoagulation 9.  Hypertension.  Continue Norvasc 5 mg daily, Cardura 4 mg daily, Visken 10 mg daily.  Monitor with increased mobility. Add magnesium gluconate '250mg'$  HS Vitals:   03/22/22 2041 03/23/22 0412  BP: (!) 124/93 127/87  Pulse: 64 88  Resp: 16 18  Temp: 97.6 F (36.4 C) 98.2 F (36.8 C)  SpO2: 97% 99%    10.  Hyperlipidemia.  Continue Crestor 11.  GERD.  Continue Protonix 12. Hypoalbuminemia: encouraged high protein diet.  13. Hypokalemia: received supplementation, repeat level Monday.    14.   LOS: 4 days A FACE TO FACE EVALUATION WAS  PERFORMED  ACharlett Blake3/02/2022, 8:34 AM

## 2022-03-23 NOTE — Progress Notes (Signed)
Occupational Therapy Session Note  Patient Details  Name: Todd Golden. MRN: [id number] Date of Birth: [birth year]  Session 1: Today's Date: 03/23/2022 OT Individual Time: 1105-1200 OT Individual Time Calculation (min): 55 min  Session 2: OT Individual Time: [id number] OT Individual Time Calculation (min): 68 min   Short Term Goals: Week 1:  OT Short Term Goal 1 (Week 1): Pt will maintain dynamic sitting balance EOB 3-5 minutes without UE or trunk support CGA OT Short Term Goal 2 (Week 1): Pt will maitian dynamic standing balance CGA using LRAD OT Short Term Goal 3 (Week 1): Pt will demonstrated increased safety awareness by initaiting use of LRAD during all transfers with min cueing  Skilled Therapeutic Interventions/Progress Updates:    Session 1: Pt sidelying in bed upon therapy arrival. Patient initially resistant  to participate in OT session. Increased education and encouragement required. Pt became frustrated due to communication difficulty although did agree to participate. Reports 0/10 pain level.   Patient participated in skilled OT session focusing on functional cognition, hand strength, problem solving, and coordination. Pin in hand gripper was placed wrong and would not come off so therapist integrated the stated focus above into therapy session by requesting pt to assist with getting the pin out. Pt attempted although was unsuccessful.  Pt participated in vertical large nuts and bolts board focusing on bilateral coordination, functional reaching, visual perception, and shoulder/scapular stability. Pt placed 2 nuts/bolts on board with VC for problem solving requiring increased time.   Pt complete functional mobility with RW (chair follow) from room to day room and back. Pt provided with directional cues for navigation and Min A for balance. VC required to pick feet versus shuffling and to remain within walker.  Pt returned to bed at end of session.  Pt continues to have  difficulty with proper hand placement during sit to stand transitions. VC and visual cues provided although pt has difficulty with carry over. States he does not see why it's wrong.    Session 2: Pt once again sidelying in bed upon therapy arrival and was resistant to participate in OT session. Pt seemed to be trying to tell OT that he had 3 therapy sessions prior to this current session. Pt's schedule only showed 3 sessions total so unsure who he was referring to that he stated he saw. Pt did agree to participate in therapy when redirected Reports 0/10 pain level.   Pt complete functional mobility with RW (chair follow) from room to day room and back. Pt provided with directional cues for navigation and Min A for balance. VC required to pick feet versus shuffling and to remain within walker.  Pt returned to bed at end of session. Upon returning to room at end of session, pt completed visual scanning task of locating proper room number.   Pt participated in hand strength and coordination tasks to increase his ability to manage and manipulate every day items such as containers, toothpaste, and jars.  - Hand gripper set to 19.6#, held vertical and/or horizontal, pt picked up and transferred 6 wooden beads of medium size. VC provided for technique.  -Small manipulative screw board completed while pt removed all screws with both hands. Removed 8 with left hand, 4 with right hand. Required increased time due to moderate difficulty. - Utilized red resistive clothespin to pick up and stack 1 foam square on top of another. Completed 6 stacks total with each hand. Increased difficulty with left hand due to lack of  coordination requiring greater than typical time to complete. Pt was able to name colors correctly when asked.  Pt completed toilet transfer with Mod A due to low surface level. Min A provided for toileting including VC for thoroughness. Pt washed his right hand only at sink after even though both hands  should have been. Required VC to use soap as he initially just rinsed his hand.     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

## 2022-03-23 NOTE — Progress Notes (Signed)
Physical Therapy Session Note  Patient Details  Name: Todd Golden. MRN: [id number] Date of Birth: May 20, 1930  Today's Date: 03/23/2022 PT Individual Time: 0900-0954 PT Individual Time Calculation (min): 54 min   Short Term Goals: Week 1:  PT Short Term Goal 1 (Week 1): Pt will perform supine<>sit with min assist PT Short Term Goal 2 (Week 1): Pt will perform sit<>stand transfers using LRAD with min assist PT Short Term Goal 3 (Week 1): Pt will perform bed<>chair transfers using LRAD with min assist PT Short Term Goal 4 (Week 1): Pt will ambulate at least 26f using LRAD with consistent min assist PT Short Term Goal 5 (Week 1): Pt will participate in a standardized outcome measure to assess his fall risk  Skilled Therapeutic Interventions/Progress Updates:      Pt sleeping in bed on arrival - awakens to voice and in agreement to therapy session although requires encouragement. Due to expressive aphasia, difficult to determine his complaints/concerns. Able to be redirected.   Supine<>sitting EOB with minA for trunk support as patient naturally reaches for assistance. Able to sit EOB unsupported without assist. Stand<>pivot transfer with minA via HHA to w/c. Transported to day room rehab gym for time management.  Sit<>stand to RW with CGA - cues needed for hand placement - pt using back of legs to assist in pushing himself up to stand. Gait training 167fwith CGA and RW - R foot slightly externally rotated with decreased step length/height, landing on forefoot first with significant valgus of 1st toe.   Pt requesting to wear shoes while participating in therapy (took time to discern due to aphasia). Returned patient to his room to work on gaPersonnel officern the room and locating/retrieving shoes himself. CGA with RW while navigating his room - poor safety awareness by abandoning RW while opening drawers or searching in tight spaces. Assist for locating shoes under the covers.  Returned  to day room rehab gym and completed similar path for gait training with RW - CGA again for safety but improved stability and stride length bilaterally. He did have some festination(?) at times and needed frequent cues for elongating his stride and improving heel strike bilaterally.  Worked on sit<>stands from mat table to improve forward weight shifting - patient relying on UE to push/power to stand. CGA for safety for standing. Static standing on firm surface with feet apart needing CGA to intermittent minA due to posterior bias with weight on heels. Patient lacking stepping strategies for weight shifting strategies to correct LOB when they occur. Eyes closed standing but unable to maintain eyes closed > 3 seconds due to fear of falling and continued posterior leaning. Continued to work on strategies for forward weight shifting and improving confidence with fear of falling.  Returned to his room and assisted to bed with ambulatory transfer with CGA and RW - continued to lack sfaety awareness while navigating his room and while approaching the bed - sitting before being fully over the surface and leaving the RW to the side of him. Removed shoes with totalA and minA needed for lying supine for BLE management/support. LPN entering room for Rx. Bed alarm on, all needs met.    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

## 2022-03-24 LAB — URINALYSIS, ROUTINE W REFLEX MICROSCOPIC
Bilirubin Urine: NEGATIVE
Glucose, UA: NEGATIVE mg/dL
Hgb urine dipstick: NEGATIVE
Ketones, ur: NEGATIVE mg/dL
Leukocytes,Ua: NEGATIVE
Nitrite: NEGATIVE
Protein, ur: NEGATIVE mg/dL
Specific Gravity, Urine: 1.009 (ref 1.005–1.030)
pH: 6 (ref 5.0–8.0)

## 2022-03-24 NOTE — Progress Notes (Signed)
Pt noted with urinary frequency and urgency throughout the night. No c/o pain. Voiding between 100 ml-200 ml each episode, difficulty with starting stream and at times pt unable to void. Bladder scan performed 127 ml noted. No c/o abdominal pain or discomfort. Pt noted using urinal while in bed, assisted out of bed at bedside for better positioning for voiding, pt able to void small amounts at those times (approximately 100 ml). Son at bedside and reports that this is new for his father and would like MD to address if needed. Primary nurse this am made aware for followup. Secure chat sent to attending today, Dr. Letta Pate as well.

## 2022-03-25 LAB — COMPREHENSIVE METABOLIC PANEL
ALT: 29 U/L (ref 0–44)
AST: 35 U/L (ref 15–41)
Albumin: 3.9 g/dL (ref 3.5–5.0)
Alkaline Phosphatase: 46 U/L (ref 38–126)
Anion gap: 8 (ref 5–15)
BUN: 20 mg/dL (ref 8–23)
CO2: 25 mmol/L (ref 22–32)
Calcium: 8.9 mg/dL (ref 8.9–10.3)
Chloride: 98 mmol/L (ref 98–111)
Creatinine, Ser: 1.54 mg/dL — ABNORMAL HIGH (ref 0.61–1.24)
GFR, Estimated: 43 mL/min — ABNORMAL LOW (ref >60)
Glucose, Bld: 104 mg/dL — ABNORMAL HIGH (ref 70–99)
Potassium: 3.5 mmol/L (ref 3.5–5.1)
Sodium: 131 mmol/L — ABNORMAL LOW (ref 135–145)
Total Bilirubin: 0.7 mg/dL (ref 0.3–1.2)
Total Protein: 7.3 g/dL (ref 6.5–8.1)

## 2022-03-25 LAB — CBC WITH DIFFERENTIAL/PLATELET
Abs Immature Granulocytes: 0.03 10*3/uL (ref 0.00–0.07)
Basophils Absolute: 0 10*3/uL (ref 0.0–0.1)
Basophils Relative: 0 %
Eosinophils Absolute: 0.1 10*3/uL (ref 0.0–0.5)
Eosinophils Relative: 2 %
HCT: 41.1 % (ref 39.0–52.0)
Hemoglobin: 13.5 g/dL (ref 13.0–17.0)
Immature Granulocytes: 0 %
Lymphocytes Relative: 19 %
Lymphs Abs: 1.3 10*3/uL (ref 0.7–4.0)
MCH: 30.6 pg (ref 26.0–34.0)
MCHC: 32.8 g/dL (ref 30.0–36.0)
MCV: 93.2 fL (ref 80.0–100.0)
Monocytes Absolute: 0.9 10*3/uL (ref 0.1–1.0)
Monocytes Relative: 12 %
Neutro Abs: 4.7 10*3/uL (ref 1.7–7.7)
Neutrophils Relative %: 67 %
Platelets: 254 10*3/uL (ref 150–400)
RBC: 4.41 MIL/uL (ref 4.22–5.81)
RDW: 13.4 % (ref 11.5–15.5)
WBC: 7 10*3/uL (ref 4.0–10.5)
nRBC: 0 % (ref 0.0–0.2)

## 2022-03-25 MED ORDER — SODIUM CHLORIDE 0.9 % IV SOLN: Freq: Every day | INTRAVENOUS | Status: DC

## 2022-03-25 MED ORDER — SORBITOL 70 % SOLN
30.0000 mL | Freq: Every day | Status: DC | PRN
  Administered 2022-03-25: 30 mL via ORAL
  Filled 2022-03-25: qty 30

## 2022-03-25 MED ORDER — SODIUM CHLORIDE 1 G PO TABS
1.0000 g | ORAL_TABLET | Freq: Two times a day (BID) | ORAL | Status: DC
  Administered 2022-03-25 – 2022-03-29 (×8): 1 g via ORAL
  Filled 2022-03-25 (×8): qty 1

## 2022-03-25 MED ORDER — POTASSIUM CHLORIDE 20 MEQ PO PACK
40.0000 meq | PACK | Freq: Once | ORAL | Status: AC
  Administered 2022-03-25: 40 meq via ORAL
  Filled 2022-03-25: qty 2

## 2022-03-25 NOTE — Progress Notes (Signed)
Occupational Therapy Session Note  Patient Details  Name: Todd Golden. MRN: [id number] Date of Birth: 03/18/1930  Today's Date: 03/25/2022 OT Individual Time: [id number] OT Individual Time Calculation (min): 72 min    Short Term Goals: Week 1:  OT Short Term Goal 1 (Week 1): Pt will maintain dynamic sitting balance EOB 3-5 minutes without UE or trunk support CGA OT Short Term Goal 2 (Week 1): Pt will maitian dynamic standing balance CGA using LRAD OT Short Term Goal 3 (Week 1): Pt will demonstrated increased safety awareness by initaiting use of LRAD during all transfers with min cueing  Skilled Therapeutic Interventions/Progress Updates:    Patient agreeable to participate in OT session. Reports 0/10 pain level.   Patient participated in skilled OT session focusing on ADL re-training, functional transfers, standing balance, safety with RW use. Therapist facilitated improved independence while patient completed bathing seated in shower. Pt able to complete both UB and LB bathing with min guard. Only min guard needed when standing to wash buttocks for safety. Pt completed UB dressing with SBA. LB dressing completed with Min A (Assist provided to doff/don non skid socks  and to open right pant leg wider in order to place foot inside). Pt combed hair while seated at sink. Declined to brush his teeth.   BUE strengthening completed in order to increase ability to complete sit to stand transitions with less difficulty.  Strengthening:  - BUE, green band, chest press, horizontal abduction/adduction, elbow flexion/extension, 10X - RUE, 5lb hand weight, bicep curl, cross body curl, shoulder flexion (to 90), 10X - LUE, 5lb hand weight, bicep curl, cross body curl, 10X  Functional mobility completed around rehab unit utilizing RW. Pt provided with Min guard for safety and VC to take larger steps with his right foot as it was shuffling. Pt completed turns both left and right with improvement noted  for RW management. No LOB noted. Two trials completed with 1 seated rest break taken in between.    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

## 2022-03-25 NOTE — Progress Notes (Signed)
PROGRESS NOTE   Subjective/Complaints: Weekend notes reviewed and patient was noted to have urinary frequency, messaged nursing to see if this is still an issue Vitals reviewed and stable  ROS: +urinary frequency reported by nursing over the weekend Objective:   No results found. No results for input(s): "WBC", "HGB", "HCT", "PLT" in the last 72 hours.  No results for input(s): "NA", "K", "CL", "CO2", "GLUCOSE", "BUN", "CREATININE", "CALCIUM" in the last 72 hours.  Intake/Output Summary (Last 24 hours) at 03/25/2022 1007 Last data filed at 03/25/2022 0732 Gross per 24 hour  Intake 336 ml  Output --  Net 336 ml        Physical Exam: Vital Signs Blood pressure 126/76, pulse 90, temperature 97.6 F (36.4 C), resp. rate 18, height '5\' 11"'$  (1.803 m), weight 82.5 kg, SpO2 98 %.  Gen: no distress, normal appearing, BMI 25.37 HEENT: oral mucosa pink and moist, NCAT Cardio: Reg rate Chest: normal effort, normal rate of breathing Abd: soft, non-distended Ext: no edema Psych: pleasant, normal affect Skin: intact Skin: No evidence of breakdown, no evidence of rash Neurologi motor strength is 5/5 in bilateral deltoid, bicep, tricep, grip, hip flexor, knee extensors, ankle dorsiflexor and plantar flexor Able to follow commands and provide yes and no answers or head nods   Musculoskeletal: Full range of motion in all 4 extremities. No joint swelling  Neurological:     Comments: Patient is alert. Contact with examiner.  He can provide his name but not place or month. Poor standing balance. Ambulating MinA with RW  Assessment/Plan: 1. Functional deficits which require 3+ hours per day of interdisciplinary therapy in a comprehensive inpatient rehab setting. Physiatrist is providing close team supervision and 24 hour management of active medical problems listed below. Physiatrist and rehab team continue to assess barriers to  discharge/monitor patient progress toward functional and medical goals  Care Tool:  Bathing    Body parts bathed by patient: Right arm, Left arm, Chest, Abdomen, Right upper leg, Left upper leg, Right lower leg, Left lower leg, Face   Body parts bathed by helper: Front perineal area, Buttocks     Bathing assist Assist Level: Moderate Assistance - Patient 50 - 74%     Upper Body Dressing/Undressing Upper body dressing   What is the patient wearing?: Pull over shirt    Upper body assist Assist Level: Minimal Assistance - Patient > 75%    Lower Body Dressing/Undressing Lower body dressing      What is the patient wearing?: Incontinence brief, Pants     Lower body assist Assist for lower body dressing: Maximal Assistance - Patient 25 - 49%     Toileting Toileting    Toileting assist Assist for toileting: Moderate Assistance - Patient 50 - 74%     Transfers Chair/bed transfer  Transfers assist     Chair/bed transfer assist level: Moderate Assistance - Patient 50 - 74% Chair/bed transfer assistive device: Programmer, multimedia   Ambulation assist      Assist level: Moderate Assistance - Patient 50 - 74% (mod A when LOB occurs) Assistive device: Walker-rolling Max distance: 112f   Walk 10 feet activity  Assist     Assist level: Moderate Assistance - Patient - 50 - 74% Assistive device: Walker-rolling   Walk 50 feet activity   Assist    Assist level: Moderate Assistance - Patient - 50 - 74% Assistive device: Walker-rolling    Walk 150 feet activity   Assist    Assist level: Moderate Assistance - Patient - 50 - 74% Assistive device: Walker-rolling    Walk 10 feet on uneven surface  activity   Assist Walk 10 feet on uneven surfaces activity did not occur: Safety/medical concerns (fatigue)         Wheelchair     Assist Is the patient using a wheelchair?: Yes Type of Wheelchair: Manual    Wheelchair assist level:  Dependent - Patient 0%      Wheelchair 50 feet with 2 turns activity    Assist        Assist Level: Dependent - Patient 0%   Wheelchair 150 feet activity     Assist      Assist Level: Dependent - Patient 0%   Blood pressure 126/76, pulse 90, temperature 97.6 F (36.4 C), resp. rate 18, height '5\' 11"'$  (1.803 m), weight 82.5 kg, SpO2 98 %.  Medical Problem List and Plan: 1. 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 Right visual field deficit vs neglect             -patient may shower             -ELOS/Goals: 10-14 days S            Continue CIR  2.  Antithrombotics: -DVT/anticoagulation:  Pharmaceutical: Lovenox             -antiplatelet therapy: Aspirin 81 mg daily 3. Pain Management: Tylenol as needed 4. Mood/Behavior/Sleep: Zoloft 50 mg daily, Aricept 10 mg daily, trazodone as needed             -antipsychotic agents: Provide emotional support 5. Neuropsych/cognition: This patient is not capable of making decisions on his own behalf. 6. Skin/Wound Care: Routine skin checks 7. Fluids/Electrolytes/Nutrition: Routine in and outs with follow-up chemistries 8.  PAF.  Cardiac rate currently controlled.  Plan is to continue aspirin for now follow-up outpatient to discuss anticoagulation 9.  Hypertension.  Continue Norvasc 5 mg daily, Cardura 4 mg daily, Visken 10 mg daily.  Monitor with increased mobility. Add magnesium gluconate '250mg'$  HS. Normalized, continue current regimen.  10.  Hyperlipidemia.  Continue Crestor 11.  GERD.  Continue Protonix 12. Hypoalbuminemia: encouraged high protein diet. CMP ordered today 13. Hypokalemia: received supplementation, repeat level today  14. Urinary frequency: UA reviewed and is normal. CBC ordered today   14.   LOS: 6 days A FACE TO FACE EVALUATION WAS PERFORMED  Suzy Kugel P Willford Rabideau 03/25/2022, 10:07 AM

## 2022-03-25 NOTE — Progress Notes (Signed)
Physical Therapy Session Note  Patient Details  Name: Todd Golden. MRN: [id number] Date of Birth: 01-05-1931  Today's Date: 03/25/2022 PT Individual Time: [id number] + 1330-1420 PT Individual Time Calculation (min): 70 min  + 50 min (missed 25 minutes due to increased agitation and refusal to participate)  Short Term Goals: Week 1:  PT Short Term Goal 1 (Week 1): Pt will perform supine<>sit with min assist PT Short Term Goal 2 (Week 1): Pt will perform sit<>stand transfers using LRAD with min assist PT Short Term Goal 3 (Week 1): Pt will perform bed<>chair transfers using LRAD with min assist PT Short Term Goal 4 (Week 1): Pt will ambulate at least 68f using LRAD with consistent min assist PT Short Term Goal 5 (Week 1): Pt will participate in a standardized outcome measure to assess his fall risk  Skilled Therapeutic Interventions/Progress Updates:       1st session: Pt sitting in w/c to start - direct handoff of care from NT who assisted patient to the bathroom. Pt in agreement to therapy session.  Donned tennis shoes with totalA for time management. Transported to main rehab gym in wheelchair.   Sit<>stand to RW with CGA with cues for hand placement. Gait training 2010fwith CGA and RW - cues for increasing B heel strike and stride length - tends to festinate at times with turns. Scavenger hunt in rehab gym environment - patient able to find 3/5 colored cones without cues, needing mod cues to find the final 4 - ambulating distances >20037fith CGA and RW - cues needed for safety approach to pick up the cones in tight spaces, especially ones that were close to the floor.  Stair training using 6inch step and 2 hand rails - patient self selecting reciprocal stepping for both ascent and descent - navigated x12 steps without a rest break.   TUG using RW x3 trials with CGA: 1) 36s 2) 32s 3) 27s *AVG = 31.67s *Score mostly impacted by time to rise to stand for turning around  cone *Scores >13.5 seconds indicate increased falls risk  Assisted onto [employer] Balance System with CGA for balance. Completed limits of stability training with mod/max cues for instruction and understanding. Limited hip/ankle strategies for leaning outside BOS - attempts to use head for weight shifting. Patient seems to lack receptiveness to tasks that are not specific to his mobility.   Pt returned to his room at end of session - remained seated in w/c with call bell in reach and all needs met.   2nd session: Direct handoff of care from LPN to start - pt sitting EOB. Patient initially refusing therapy, needing max encouragement to participate in session. Only able to get him to agree when offered to take him outside. Ambulatory transfer with RW and CGA from EOB to w/c. [facility], practiced gait training with RW on unlevel and unfamiliar environment, paved surfaces only. Patient with decreased safety awareness, abandoning RW, lifting RW over cracks, and pushing RW to the side in frustration. Patient's aphasia limits ability to understand his problem solving or thought process. Poor frustration tolerance as he quickly becomes agitated when unable to understand him or when asked to do something he doesn't want to do. [facility]ed to [facility] but patient saying "room" and not willing to get out of his wheelchair. [facility]ed [facility] - completed car transfer with minA and max safety cues as patient attempts  to side step into the vehicle with his leg getting stuck in the RW frame. Limited receptiveness to education. Practiced navigating ramped surfaces with RW and CGA, 75f distances. Patient continued to show impaired insight and awareness throughout session. Returned to his room and assisted to bed. Bed mobility completed without assist. Alarm on and all needs met at end of session. He missed 25 minutes due to refusal to  participate and increased agitation/frustration. MD made aware of patient's limited activity tolerance - recommending 15/7 to improve participation.       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

## 2022-03-25 NOTE — Plan of Care (Signed)
Pt's plan of care adjusted to 15/7 after speaking with care team and discussed with MD in team conference as pt currently unable to tolerate current therapy schedule with OT, PT, and SLP.   

## 2022-03-25 NOTE — Progress Notes (Signed)
Pt. And family Declined PIV start as of this time, pt. And family Requested to have it done and the ordered IVF given in a.m. RN at bedside explained to family and patient. To place another consult in a.m.

## 2022-03-26 LAB — BASIC METABOLIC PANEL
Anion gap: 6 (ref 5–15)
BUN: 18 mg/dL (ref 8–23)
CO2: 26 mmol/L (ref 22–32)
Calcium: 8.9 mg/dL (ref 8.9–10.3)
Chloride: 103 mmol/L (ref 98–111)
Creatinine, Ser: 1.22 mg/dL (ref 0.61–1.24)
GFR, Estimated: 57 mL/min — ABNORMAL LOW (ref >60)
Glucose, Bld: 114 mg/dL — ABNORMAL HIGH (ref 70–99)
Potassium: 3.5 mmol/L (ref 3.5–5.1)
Sodium: 135 mmol/L (ref 135–145)

## 2022-03-26 MED ORDER — SIMETHICONE 80 MG PO CHEW
80.0000 mg | CHEWABLE_TABLET | Freq: Four times a day (QID) | ORAL | Status: DC | PRN
  Administered 2022-03-26: 80 mg via ORAL
  Filled 2022-03-26: qty 1

## 2022-03-26 MED ORDER — POTASSIUM CHLORIDE 20 MEQ PO PACK
40.0000 meq | PACK | Freq: Once | ORAL | Status: AC
  Administered 2022-03-26: 40 meq via ORAL
  Filled 2022-03-26: qty 2

## 2022-03-26 MED ORDER — TAMSULOSIN HCL 0.4 MG PO CAPS
0.4000 mg | ORAL_CAPSULE | Freq: Every day | ORAL | Status: DC
  Administered 2022-03-26: 0.4 mg via ORAL
  Filled 2022-03-26: qty 1

## 2022-03-26 NOTE — Progress Notes (Signed)
Patient ID: Todd Glaze., male   DOB: 05/27/1930, 89 y.o.   MRN: [id number] Met with wife who is here and have scheduled family training for tomorrow at 8:00-11:00 since she will be staying the night tonight. Discussed will find out equipment needs, she does feel a hospital bed would make it easier for them at home. Has a rw and rollator will find out from team recommendations and get back with wife.

## 2022-03-26 NOTE — Progress Notes (Signed)
Physical Therapy Session Note  Patient Details  Name: Todd Golden. MRN: [id number] Date of Birth: 1930/05/30  Today's Date: 03/26/2022 PT Individual Time: 1330-1414 PT Individual Time Calculation (min): 44 min   Short Term Goals: Week 1:  PT Short Term Goal 1 (Week 1): Pt will perform supine<>sit with min assist PT Short Term Goal 2 (Week 1): Pt will perform sit<>stand transfers using LRAD with min assist PT Short Term Goal 3 (Week 1): Pt will perform bed<>chair transfers using LRAD with min assist PT Short Term Goal 4 (Week 1): Pt will ambulate at least 46f using LRAD with consistent min assist PT Short Term Goal 5 (Week 1): Pt will participate in a standardized outcome measure to assess his fall risk  Skilled Therapeutic Interventions/Progress Updates:     [facility]. Reports pain in R hip. Number not provided. PT provides rest breaks as needed to manage pain. WC transport outside for time management. Pt performs gait training outdoors for ambulation over unlevel and varying surfaces. Pt performs sit to stand with modA due to lack of adequate anterior weight shift, with cues for hand placement and sequencing. Pt ambulates x250' with RW and CGA/minA, with cues to increase step height to prevent "scuffing" feet during swing phase. Pt has most difficulty with turns and safe AD management when approaching WC, requiring additional cueing for safety. WC transport back inside. Pt ambulates additional x175' with CGA and similar cueing. WC transport back to room. Stand step to bed with CGA and cues for positioning. Pt requires minA for sit to supine with cues for sequencing and positioning. Left supine with alarm intact and all needs within reach.   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

## 2022-03-26 NOTE — Progress Notes (Signed)
Occupational Therapy Session Note  Patient Details  Name: Todd Golden. MRN: [id number] Date of Birth: 16-Jun-1930  Today's Date: 03/26/2022 OT Individual Time: [id number] OT Individual Time Calculation (min): 58 min    Short Term Goals: Week 1:  OT Short Term Goal 1 (Week 1): Pt will maintain dynamic sitting balance EOB 3-5 minutes without UE or trunk support CGA OT Short Term Goal 2 (Week 1): Pt will maitian dynamic standing balance CGA using LRAD OT Short Term Goal 3 (Week 1): Pt will demonstrated increased safety awareness by initaiting use of LRAD during all transfers with min cueing  Skilled Therapeutic Interventions/Progress Updates:    Patient received in bathroom, NT assisting patient with toileting.  Patient able to assist with clothing management with cueing.  Patient walked back to bedroom with cueing/ facilitation to address weight shifts and step length.  Patient able to walk from bathroom to sink, then to ADL apartment with one seated rest break.  Patient able to focus on postural control and step length for up to 20 consecutive steps before loss of control.  Patient with noticeable change to gait (to small steps, shuffling gait, right foot drag) with any distraction or deviation from walking straight in clear halway - turning walker, turning head, obstacle, etc.  Patient returned to room - son at bedside.  Patient needs further practice on forward weight shift for sit to stand/ stand to sit.  Left in bed - supine with alarm engaged and personal items in reach.    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        Therapy/Group: Individual Therapy  Mariah Milling 03/26/2022, 12:43 PM

## 2022-03-26 NOTE — Progress Notes (Signed)
Physical Therapy Session Note  Patient Details  Name: Todd Golden. MRN: [id number] Date of Birth: [birth year]  Today's Date: 03/26/2022 PT Individual Time: 1032-1115 PT Individual Time Calculation (min): 43 min   Short Term Goals: Week 1:  PT Short Term Goal 1 (Week 1): Pt will perform supine<>sit with min assist PT Short Term Goal 2 (Week 1): Pt will perform sit<>stand transfers using LRAD with min assist PT Short Term Goal 3 (Week 1): Pt will perform bed<>chair transfers using LRAD with min assist PT Short Term Goal 4 (Week 1): Pt will ambulate at least 63f using LRAD with consistent min assist PT Short Term Goal 5 (Week 1): Pt will participate in a standardized outcome measure to assess his fall risk Week 2:     Skilled Therapeutic Interventions/Progress Updates:  Patient *** on entrance to room. Patient alert and agreeable to PT session.   Patient with no pain complaint at start of session.  Therapeutic Activity: Bed Mobility: Pt performed supine <> sit with ***. VC/ tc required for ***. Transfers: Pt performed sit<>stand and stand pivot transfers throughout session with ***. Provided verbal cues for***.  Gait Training:  Pt ambulated *** ft using *** with ***. Demonstrated ***. Provided vc/ tc for ***.  Neuromuscular Re-ed: NMR facilitated during session with focus on***. Pt guided in continuous reciprocation of BUE and BLE using NuStep L*** x ***min with focus on ***.  NMR performed for improvements in motor control and coordination, balance, sequencing, judgement, and self confidence/ efficacy in performing all aspects of mobility at highest level of independence.   Patient supine in bed at end of session with brakes locked, bed alarm set, and all needs within reach. Wife present.   - ambulation from room to day room using RW with MinA and consistent vc for increased step height/ length. - ambulation from W26 to WOasisand into room.     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   Therapy/Group: Individual Therapy  JAlger SimonsPT, DPT, CSRS 03/26/2022, 8:05 AM

## 2022-03-26 NOTE — Progress Notes (Signed)
PROGRESS NOTE   Subjective/Complaints: Complaining of right sided flank pain. Wife says this is common for him and she does not feel he needs renal ultrasound. Son says he has history of stent placement  ROS: +urinary frequency reported by nursing over the weekend- improved Sunday but continued last night Objective:   No results found. Recent Labs    03/25/22 1118  WBC 7.0  HGB 13.5  HCT 41.1  PLT 254    Recent Labs    03/25/22 1118 03/26/22 0735  NA 131* 135  K 3.5 3.5  CL 98 103  CO2 25 26  GLUCOSE 104* 114*  BUN 20 18  CREATININE 1.54* 1.22  CALCIUM 8.9 8.9    Intake/Output Summary (Last 24 hours) at 03/26/2022 1047 Last data filed at 03/25/2022 1834 Gross per 24 hour  Intake 297 ml  Output --  Net 297 ml        Physical Exam: Vital Signs Blood pressure 130/83, pulse 77, temperature 98 F (36.7 C), resp. rate 17, height '5\' 11"'$  (1.803 m), weight 82.5 kg, SpO2 97 %.  Gen: no distress, normal appearing, BMI 25.37 HEENT: oral mucosa pink and moist, NCAT Cardio: Reg rate Chest: normal effort, normal rate of breathing Abd: soft, non-distended Ext: no edema Psych: pleasant, normal affect Skin: intact Skin: No evidence of breakdown, no evidence of rash Neurologi motor strength is 5/5 in bilateral deltoid, bicep, tricep, grip, hip flexor, knee extensors, ankle dorsiflexor and plantar flexor Able to follow commands and provide yes and no answers or head nods   Musculoskeletal: Full range of motion in all 4 extremities. No joint swelling Right flank TTP  Neurological:     Comments: Patient is alert. Contact with examiner.  He can provide his name but not place or month. Poor standing balance. Ambulating MinA with RW  Assessment/Plan: 1. Functional deficits which require 3+ hours per day of interdisciplinary therapy in a comprehensive inpatient rehab setting. Physiatrist is providing close team  supervision and 24 hour management of active medical problems listed below. Physiatrist and rehab team continue to assess barriers to discharge/monitor patient progress toward functional and medical goals  Care Tool:  Bathing    Body parts bathed by patient: Right arm, Left arm, Chest, Abdomen, Right upper leg, Left upper leg, Right lower leg, Left lower leg, Face   Body parts bathed by helper: Front perineal area, Buttocks     Bathing assist Assist Level: Moderate Assistance - Patient 50 - 74%     Upper Body Dressing/Undressing Upper body dressing   What is the patient wearing?: Pull over shirt    Upper body assist Assist Level: Minimal Assistance - Patient > 75%    Lower Body Dressing/Undressing Lower body dressing      What is the patient wearing?: Incontinence brief, Pants     Lower body assist Assist for lower body dressing: Maximal Assistance - Patient 25 - 49%     Toileting Toileting    Toileting assist Assist for toileting: Moderate Assistance - Patient 50 - 74%     Transfers Chair/bed transfer  Transfers assist     Chair/bed transfer assist level: Moderate Assistance - Patient 50 -  74% Chair/bed transfer assistive device: Museum/gallery exhibitions officer assist      Assist level: Moderate Assistance - Patient 50 - 74% (mod A when LOB occurs) Assistive device: Walker-rolling Max distance: 140f   Walk 10 feet activity   Assist     Assist level: Moderate Assistance - Patient - 50 - 74% Assistive device: Walker-rolling   Walk 50 feet activity   Assist    Assist level: Moderate Assistance - Patient - 50 - 74% Assistive device: Walker-rolling    Walk 150 feet activity   Assist    Assist level: Moderate Assistance - Patient - 50 - 74% Assistive device: Walker-rolling    Walk 10 feet on uneven surface  activity   Assist Walk 10 feet on uneven surfaces activity did not occur: Safety/medical concerns (fatigue)          Wheelchair     Assist Is the patient using a wheelchair?: Yes Type of Wheelchair: Manual    Wheelchair assist level: Dependent - Patient 0%      Wheelchair 50 feet with 2 turns activity    Assist        Assist Level: Dependent - Patient 0%   Wheelchair 150 feet activity     Assist      Assist Level: Dependent - Patient 0%   Blood pressure 130/83, pulse 77, temperature 98 F (36.7 C), resp. rate 17, height '5\' 11"'$  (1.803 m), weight 82.5 kg, SpO2 97 %.  Medical Problem List and Plan: 1. 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 Right visual field deficit vs neglect             -patient may shower             -ELOS/Goals: 10-14 days S           Continue CIR  2.  Antithrombotics: -DVT/anticoagulation:  Pharmaceutical: Lovenox             -antiplatelet therapy: Aspirin 81 mg daily 3. Pain Management: Tylenol as needed 4. Mood/Behavior/Sleep: Zoloft 50 mg daily, Aricept 10 mg daily, trazodone as needed             -antipsychotic agents: Provide emotional support 5. Neuropsych/cognition: This patient is not capable of making decisions on his own behalf. 6. Skin/Wound Care: Routine skin checks 7. Fluids/Electrolytes/Nutrition: Routine in and outs with follow-up chemistries 8.  PAF.  Cardiac rate currently controlled.  Plan is to continue aspirin for now follow-up outpatient to discuss anticoagulation 9.  Hypertension.  Flomax HS ordered for urinary frequency and Norvasc d/ced, Cardura 4 mg daily, Visken 10 mg daily.  Monitor with increased mobility. Add magnesium gluconate '250mg'$  HS. Normalized, continue current regimen.  10.  Hyperlipidemia.  Continue Crestor 11.  GERD.  Continue Protonix 12. Hypoalbuminemia: encouraged high protein diet. Improved on repeat CMP 13. Hypokalemia: received supplementation, additional 475m klor ordered 14. Urinary  frequency: UA reviewed and is normal. CBC ordered and WBC is normal. Flomax ordered HS 15. Right flank pain: kpad ordered.  16. AKI: encouraged 6-8 glasses of water per day, normalized   14.   LOS: 7 days A FACE TO FACE EVALUATION WAS PERFORMED  KrClide Deutscheraulkar 03/26/2022, 10:47 AM

## 2022-03-27 LAB — BASIC METABOLIC PANEL
Anion gap: 10 (ref 5–15)
BUN: 17 mg/dL (ref 8–23)
CO2: 25 mmol/L (ref 22–32)
Calcium: 9.1 mg/dL (ref 8.9–10.3)
Chloride: 104 mmol/L (ref 98–111)
Creatinine, Ser: 1.21 mg/dL (ref 0.61–1.24)
GFR, Estimated: 57 mL/min — ABNORMAL LOW (ref >60)
Glucose, Bld: 104 mg/dL — ABNORMAL HIGH (ref 70–99)
Potassium: 3.6 mmol/L (ref 3.5–5.1)
Sodium: 139 mmol/L (ref 135–145)

## 2022-03-27 MED ORDER — POTASSIUM CHLORIDE 20 MEQ PO PACK
40.0000 meq | PACK | Freq: Once | ORAL | Status: AC
  Administered 2022-03-27: 40 meq via ORAL
  Filled 2022-03-27: qty 2

## 2022-03-27 MED ORDER — TAMSULOSIN HCL 0.4 MG PO CAPS
0.8000 mg | ORAL_CAPSULE | Freq: Every day | ORAL | Status: DC
  Administered 2022-03-27 – 2022-03-28 (×2): 0.8 mg via ORAL
  Filled 2022-03-27 (×2): qty 2

## 2022-03-27 NOTE — Progress Notes (Signed)
Physical Therapy Session Note  Patient Details  Name: Todd Golden. MRN: [id number] Date of Birth: 1930/03/24  Today's Date: 03/27/2022 PT Individual Time: 0800-0843 PT Individual Time Calculation (min): 43 min   Short Term Goals: Week 1:  PT Short Term Goal 1 (Week 1): Pt will perform supine<>sit with min assist PT Short Term Goal 2 (Week 1): Pt will perform sit<>stand transfers using LRAD with min assist PT Short Term Goal 3 (Week 1): Pt will perform bed<>chair transfers using LRAD with min assist PT Short Term Goal 4 (Week 1): Pt will ambulate at least 36f using LRAD with consistent min assist PT Short Term Goal 5 (Week 1): Pt will participate in a standardized outcome measure to assess his fall risk  Skilled Therapeutic Interventions/Progress Updates:       Pt sitting in w/c to start with his wife at the bedside for scheduled family education and training. Discussed PT Goals, PT POC, DME rec's (hospital bed), and follow up (HHPT). Patient reports he doesn't want to stay in rehab much longer and really would like to go home - facilitated discussion b/w family and patient to determine DC plan - ultimately agreeable to DC on Friday  ( 3/8) and will relay to care team.  Transported in w/c to main rehab gym. Sit<>Stand to RW with CGA with cues for forward weight shifting and hand placement - continues to rely heavily on back of legs for standing and limited forward weight shifting while standing. Ambulated ~1049fwith CGA and RW with wife observing and PT cueing patient to increasing step height bilaterally and keep body within walker frame. Also educated wife on guarding and positioning for safety. Teachback method with wife ambulating with patient with CGA and PT at SBA level for safety, gait distances >15075fncluding turns and managing doorways.   Practiced car transfer with car height simulating their personal vehicle - education and demonstration on safety approach and [facility] Wife confirms that patient is near his baseline.   Returned to his room and patient concluded session in wheelchair with all needs met, wife at the bedside. Discussed fall prevention strategies and home safety tips for home - remove throw rugs, encourage well lit hallways/bathrooms, toileting prior to going to bed, etc.         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

## 2022-03-27 NOTE — Progress Notes (Signed)
Occupational Therapy Weekly Progress Note  Patient Details  Name: Todd Golden. MRN: [id number] Date of Birth: 01-14-1931  Beginning of progress report period: March 20, 2022 End of progress report period: March 27, 2022  Today's Date: 03/27/2022 OT Individual Time: 1002-1100 and 1415-1456 OT Individual Time Calculation (min): 58 min and 41 min   Patient has met 3 of 3 short term goals.  Patient showing improved strength and activity tolerance  Patient continues to demonstrate the following deficits: decreased coordination and decreased motor planning, decreased attention, decreased awareness, decreased problem solving, decreased safety awareness, decreased memory, and delayed processing, and decreased sitting balance, decreased standing balance, decreased postural control, hemiplegia, and decreased balance strategies and therefore will continue to benefit from skilled OT intervention to enhance overall performance with BADL.  Patient progressing toward long term goals..  Continue plan of care.  OT Short Term Goals Week 1:  OT Short Term Goal 1 (Week 1): Pt will maintain dynamic sitting balance EOB 3-5 minutes without UE or trunk support CGA OT Short Term Goal 1 - Progress (Week 1): Met OT Short Term Goal 2 (Week 1): Pt will maitian dynamic standing balance CGA using LRAD OT Short Term Goal 2 - Progress (Week 1): Met OT Short Term Goal 3 (Week 1): Pt will demonstrated increased safety awareness by initaiting use of LRAD during all transfers with min cueing OT Short Term Goal 3 - Progress (Week 1): Met Week 2:   STG=LTG due to LOS  Skilled Therapeutic Interventions/Progress Updates:    AM Session:  Patient received in wheelchair for family education with wife, [name].  Patient irritated at beginning of session and not wanting to discuss staying until Friday, waiting for equipment, wanting to go home now.  Patient calmed down over the session, wife nonplussed by his outbursts and  gently brings him back to focus.   Patient declined shower, but agreeable to wash up and change clothing.  Patient able to complete with assist only for cueing next steps, and with incontinence brief.  Patient able to indicate need to void and walked with therapist to bathroom for continent void and able to complete hygiene.  Patient left up in wheelchair with chair pad alarm on, and personal items/ call bell in reach.  Wife at bedside.    PM Session:  Patient received supine in bed.  Patient originally scheduled for 1pm - but resting peacefully, and opting to stay in bed. When returned later in pm - patient agreeable to getting up and walking.  Patient with improved step length, less scuffing of right foot today.  Improved functional walking - walking in crowded hallway, turning corners, changing direction, etc.  Patient shows improved activity tolerance, improved safety with sit to stand and stand to sit transitions.  Patient walked back to room and left up in wheelchair with chair pad alarm in place.  Son in room with patient.    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

## 2022-03-27 NOTE — Progress Notes (Signed)
Patient had a quite night. Wife stayed overnight to assist with care. Patient denied pain and self positions in bed. Safety maintained at all times.

## 2022-03-27 NOTE — Progress Notes (Signed)
PROGRESS NOTE   Subjective/Complaints: No new complaints this morning Wife at bedside has no new concerns, discussed adding flomax for urinary frequency  ROS: +urinary frequency reported by nursing over the weekend- improved Sunday but continued last night, right flank pain improved Objective:   No results found. Recent Labs    03/25/22 1118  WBC 7.0  HGB 13.5  HCT 41.1  PLT 254    Recent Labs    03/26/22 0735 03/27/22 0711  NA 135 139  K 3.5 3.6  CL 103 104  CO2 26 25  GLUCOSE 114* 104*  BUN 18 17  CREATININE 1.22 1.21  CALCIUM 8.9 9.1    Intake/Output Summary (Last 24 hours) at 03/27/2022 1117 Last data filed at 03/27/2022 0800 Gross per 24 hour  Intake 732 ml  Output --  Net 732 ml        Physical Exam: Vital Signs Blood pressure (!) 150/103, pulse 72, temperature 97.9 F (36.6 C), temperature source Oral, resp. rate 16, height '5\' 11"'$  (1.803 m), weight 82.5 kg, SpO2 98 %.  Gen: no distress, normal appearing, BMI 25.37 HEENT: oral mucosa pink and moist, NCAT Cardio: Reg rate Chest: normal effort, normal rate of breathing Abd: soft, non-distended Ext: no edema Psych: pleasant, normal affect Skin: intact Skin: No evidence of breakdown, no evidence of rash Neurologi motor strength is 5/5 in bilateral deltoid, bicep, tricep, grip, hip flexor, knee extensors, ankle dorsiflexor and plantar flexor Able to follow commands and provide yes and no answers or head nods   Musculoskeletal: Full range of motion in all 4 extremities. No joint swelling Right flank TTP- improved  Neurological:     Comments: Patient is alert. Contact with examiner.  He can provide his name but not place or month. Poor standing balance. Ambulating MinA with RW  Assessment/Plan: 1. Functional deficits which require 3+ hours per day of interdisciplinary therapy in a comprehensive inpatient rehab setting. Physiatrist is providing  close team supervision and 24 hour management of active medical problems listed below. Physiatrist and rehab team continue to assess barriers to discharge/monitor patient progress toward functional and medical goals  Care Tool:  Bathing    Body parts bathed by patient: Right arm, Left arm, Chest, Abdomen, Right upper leg, Left upper leg, Right lower leg, Left lower leg, Face   Body parts bathed by helper: Front perineal area, Buttocks     Bathing assist Assist Level: Moderate Assistance - Patient 50 - 74%     Upper Body Dressing/Undressing Upper body dressing   What is the patient wearing?: Pull over shirt    Upper body assist Assist Level: Minimal Assistance - Patient > 75%    Lower Body Dressing/Undressing Lower body dressing      What is the patient wearing?: Incontinence brief, Pants     Lower body assist Assist for lower body dressing: Maximal Assistance - Patient 25 - 49%     Toileting Toileting    Toileting assist Assist for toileting: Moderate Assistance - Patient 50 - 74%     Transfers Chair/bed transfer  Transfers assist     Chair/bed transfer assist level: Moderate Assistance - Patient 50 - 74% Chair/bed  transfer assistive device: Programmer, multimedia   Ambulation assist      Assist level: Moderate Assistance - Patient 50 - 74% (mod A when LOB occurs) Assistive device: Walker-rolling Max distance: 15f   Walk 10 feet activity   Assist     Assist level: Moderate Assistance - Patient - 50 - 74% Assistive device: Walker-rolling   Walk 50 feet activity   Assist    Assist level: Moderate Assistance - Patient - 50 - 74% Assistive device: Walker-rolling    Walk 150 feet activity   Assist    Assist level: Moderate Assistance - Patient - 50 - 74% Assistive device: Walker-rolling    Walk 10 feet on uneven surface  activity   Assist Walk 10 feet on uneven surfaces activity did not occur: Safety/medical concerns  (fatigue)         Wheelchair     Assist Is the patient using a wheelchair?: Yes Type of Wheelchair: Manual    Wheelchair assist level: Dependent - Patient 0%      Wheelchair 50 feet with 2 turns activity    Assist        Assist Level: Dependent - Patient 0%   Wheelchair 150 feet activity     Assist      Assist Level: Dependent - Patient 0%   Blood pressure (!) 150/103, pulse 72, temperature 97.9 F (36.6 C), temperature source Oral, resp. rate 16, height '5\' 11"'$  (1.803 m), weight 82.5 kg, SpO2 98 %.  Medical Problem List and Plan: 1. 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 Right visual field deficit vs neglect             -patient may shower             -ELOS/Goals: 10-14 days S           Continue CIR  2.  Antithrombotics: -DVT/anticoagulation:  Pharmaceutical: Lovenox             -antiplatelet therapy: Aspirin 81 mg daily 3. Pain Management: Tylenol as needed 4. Mood/Behavior/Sleep: Zoloft 50 mg daily, Aricept 10 mg daily, trazodone as needed             -antipsychotic agents: Provide emotional support 5. Neuropsych/cognition: This patient is not capable of making decisions on his own behalf. 6. Skin/Wound Care: Routine skin checks 7. Fluids/Electrolytes/Nutrition: Routine in and outs with follow-up chemistries 8.  PAF.  Cardiac rate currently controlled.  Plan is to continue aspirin for now follow-up outpatient to discuss anticoagulation 9.  Hypertension.  Flomax HS ordered for urinary frequency and Norvasc d/ced, increase flomax to 0.'8mg'$ , Cardura 4 mg daily, Visken 10 mg daily.  Monitor with increased mobility. Add magnesium gluconate '250mg'$  HS. Normalized, continue current regimen.  10.  Hyperlipidemia.  Continue Crestor 11.  GERD.  Continue Protonix 12. Hypoalbuminemia: encouraged high protein diet. Improved on repeat CMP 13.  Hypokalemia: received supplementation, additional 48m klor ordered 14. Urinary frequency: UA reviewed and is normal. CBC ordered and WBC is normal. Flomax ordered HS, increased to 0.'8mg'$  15. Right flank pain: kpad ordered. Discussed with family that he has history of stent.  16. AKI: encouraged 6-8 glasses of water per day, normalized. IV fluids ordered but patient refused.    14.   LOS: 8 days A FACE TO FACE EVALUATION WAS PERFORMED  KrMartha Clan Jerene Yeager 03/27/2022, 11:17 AM

## 2022-03-27 NOTE — Discharge Summary (Signed)
Physician Discharge Summary  Patient ID: Todd Golden. MRN: [id number] DOB/AGE: August 05, 1930 89 y.o.  Admit date: 03/19/2022 Discharge date: 03/29/2022  Discharge Diagnoses:  Principal Problem:   Left middle cerebral artery stroke [facility]) DVT prophylaxis PAF Hypertension Hyperlipidemia GERD AKI Hyponatremia-resolved BPH   Discharged Condition: Stable  Significant Diagnostic Studies: ECHOCARDIOGRAM COMPLETE BUBBLE STUDY  Result Date: 03/16/2022    ECHOCARDIOGRAM REPORT   Patient Name:   Todd Golden. Date of Exam: 03/16/2022 Medical Rec #:  [id number]            Height:       71.0 in Accession #:    [id number]           Weight:       174.8 lb Date of Birth:  11-Mar-1930            BSA:          1.991 m Patient Age:    36 years             BP:           136/94 mmHg Patient Gender: M                    HR:           96 bpm. Exam Location:  [facility] Procedure: 2D Echo, Color Doppler, Cardiac Doppler and Saline Contrast Bubble            Study Indications:     Stroke 434.91  History:         Patient has no prior history of Echocardiogram examinations.                  Stroke; Risk Factors:Dyslipidemia and Hypertension.  Sonographer:     . Thornton-Maynard Referring Phys:  [id number] A MANSY Diagnosing Phys: Fransico Him MD IMPRESSIONS  1. Left ventricular ejection fraction, by estimation, is 40 to 45%. The left ventricle has mildly decreased function. The left ventricle demonstrates global hypokinesis. Left ventricular diastolic function could not be evaluated.  2. Right ventricular systolic function is normal. The right ventricular size is normal. There is normal pulmonary artery systolic pressure. The estimated right ventricular systolic pressure is Q000111Q mmHg.  3. The mitral valve is normal in structure. Trivial mitral valve regurgitation. No evidence of mitral stenosis.  4. The aortic valve is normal in structure. Aortic valve regurgitation is not visualized. Aortic valve  sclerosis/calcification is present, without any evidence of aortic stenosis. Aortic valve area, by VTI measures 2.26 cm. Aortic valve mean gradient measures 2.7 mmHg. Aortic valve Vmax measures 1.15 m/s.  5. Aortic dilatation noted. There is mild dilatation of the ascending aorta, measuring 41 mm.  6. The inferior vena cava is normal in size with greater than 50% respiratory variability, suggesting right atrial pressure of 3 mmHg. Conclusion(s)/Recommendation(s): No intracardiac source of embolism detected on this transthoracic study. Consider a transesophageal echocardiogram to exclude cardiac source of embolism if clinically indicated. FINDINGS  Left Ventricle: Left ventricular ejection fraction, by estimation, is 40 to 45%. The left ventricle has mildly decreased function. The left ventricle demonstrates global hypokinesis. The left ventricular internal cavity size was normal in size. There is  no left ventricular hypertrophy. Left ventricular diastolic function could not be evaluated. Right Ventricle: The right ventricular size is normal. No increase in right ventricular wall thickness. Right ventricular systolic function is normal. There is normal pulmonary artery systolic pressure. The tricuspid regurgitant velocity is 2.01  m/s, and  with an assumed right atrial pressure of 3 mmHg, the estimated right ventricular systolic pressure is Q000111Q mmHg. Left Atrium: Left atrial size was normal in size. Right Atrium: Right atrial size was normal in size. Pericardium: There is no evidence of pericardial effusion. Mitral Valve: The mitral valve is normal in structure. Mild mitral annular calcification. Trivial mitral valve regurgitation. No evidence of mitral valve stenosis. Tricuspid Valve: The tricuspid valve is normal in structure. Tricuspid valve regurgitation is trivial. No evidence of tricuspid stenosis. Aortic Valve: The aortic valve is normal in structure. Aortic valve regurgitation is not visualized. Aortic  valve sclerosis/calcification is present, without any evidence of aortic stenosis. Aortic valve mean gradient measures 2.7 mmHg. Aortic valve peak  gradient measures 5.3 mmHg. Aortic valve area, by VTI measures 2.26 cm. Pulmonic Valve: The pulmonic valve was normal in structure. Pulmonic valve regurgitation is not visualized. No evidence of pulmonic stenosis. Aorta: Aortic dilatation noted. There is mild dilatation of the ascending aorta, measuring 41 mm. Venous: The inferior vena cava is normal in size with greater than 50% respiratory variability, suggesting right atrial pressure of 3 mmHg. IAS/Shunts: No atrial level shunt detected by color flow Doppler. Agitated saline contrast was given intravenously to evaluate for intracardiac shunting.  LEFT VENTRICLE PLAX 2D LVIDd:         5.30 cm     Diastology LVIDs:         3.60 cm     LV e' medial:    7.51 cm/s LV PW:         1.10 cm     LV E/e' medial:  10.3 LV IVS:        0.90 cm     LV e' lateral:   12.10 cm/s LVOT diam:     2.20 cm     LV E/e' lateral: 6.4 LV SV:         41 LV SV Index:   21 LVOT Area:     3.80 cm  LV Volumes (MOD) LV vol d, MOD A2C: 85.3 ml LV vol d, MOD A4C: 61.8 ml LV vol s, MOD A2C: 52.5 ml LV vol s, MOD A4C: 31.9 ml LV SV MOD A2C:     32.8 ml LV SV MOD A4C:     61.8 ml LV SV MOD BP:      33.5 ml RIGHT VENTRICLE RV S prime:     12.10 cm/s TAPSE (M-mode): 2.1 cm LEFT ATRIUM             Index        RIGHT ATRIUM           Index LA diam:        4.40 cm 2.21 cm/m   RA Area:     11.30 cm LA Vol (A2C):   69.5 ml 34.90 ml/m  RA Volume:   21.20 ml  10.65 ml/m LA Vol (A4C):   57.6 ml 28.92 ml/m LA Biplane Vol: 67.3 ml 33.80 ml/m  AORTIC VALVE                    PULMONIC VALVE AV Area (Vmax):    2.22 cm     PV Vmax:       0.96 m/s AV Area (Vmean):   2.20 cm     PV Peak grad:  3.7 mmHg AV Area (VTI):     2.26 cm AV Vmax:  115.00 cm/s AV Vmean:          82.333 cm/s AV VTI:            0.183 m AV Peak Grad:      5.3 mmHg AV Mean Grad:       2.7 mmHg LVOT Vmax:         67.10 cm/s LVOT Vmean:        47.600 cm/s LVOT VTI:          0.109 m LVOT/AV VTI ratio: 0.59  AORTA Ao Root diam: 3.70 cm Ao Asc diam:  4.10 cm MITRAL VALVE               TRICUSPID VALVE MV Area (PHT): 4.73 cm    TR Peak grad:   16.2 mmHg MV Decel Time: 161 msec    TR Vmax:        201.00 cm/s MV E velocity: 77.20 cm/s                            SHUNTS                            Systemic VTI:  0.11 m                            Systemic Diam: 2.20 cm Fransico Him MD Electronically signed by Fransico Him MD Signature Date/Time: 03/16/2022/7:18:14 PM    Final    CT ANGIO HEAD NECK W WO CM  Result Date: 03/16/2022 CLINICAL DATA:  89 year old male with falls. Small superior perirolandic white matter infarct on brain MRI yesterday. EXAM: CT ANGIOGRAPHY HEAD AND NECK TECHNIQUE: Multidetector CT imaging of the head and neck was performed using the standard protocol during bolus administration of intravenous contrast. Multiplanar CT image reconstructions and MIPs were obtained to evaluate the vascular anatomy. Carotid stenosis measurements (when applicable) are obtained utilizing NASCET criteria, using the distal internal carotid diameter as the denominator. RADIATION DOSE REDUCTION: This exam was performed according to the departmental dose-optimization program which includes automated exposure control, adjustment of the mA and/or kV according to patient size and/or use of iterative reconstruction technique. CONTRAST:  26m OMNIPAQUE IOHEXOL 350 MG/ML SOLN COMPARISON:  Brain MRI, intracranial MRA yesterday. Head CT yesterday. FINDINGS: CT HEAD Brain: Stable non contrast CT appearance of the brain. Chronic left MCA territory cortical encephalomalacia and advanced bilateral cerebral white matter disease. Small white matter infarct remains occult by CT. No acute intracranial hemorrhage identified. No midline shift, mass effect, or evidence of intracranial mass lesion. Calvarium and skull base: No  acute osseous abnormality identified. Paranasal sinuses: Visualized paranasal sinuses and mastoids are stable and well aerated. Orbits: No acute orbit or scalp soft tissue finding. CTA NECK Skeleton: Age-appropriate cervical spine degeneration. No acute osseous abnormality identified. Upper chest: Minor atelectasis. Negative visible superior mediastinum. Other neck: No acute finding. Aortic arch: 3 vessel arch configuration. Moderate Calcified aortic atherosclerosis. Right carotid system: Brachiocephalic artery tortuosity and mild atherosclerosis. Tortuous right CCA origin. No significant stenosis. Minimal plaque at the right carotid bifurcation. No stenosis to the skull base. Left carotid system: Similar tortuosity and minimal for age atherosclerosis without stenosis. Vertebral arteries: Tortuous proximal right subclavian artery with mild plaque and no stenosis. Right vertebral artery origin remains within normal limits. The right vertebral is non dominant and diminutive but remains patent to  the skull base without stenosis. Proximal left subclavian artery soft and calcified plaque without stenosis. Normal left vertebral artery origin. Tortuous left V1 segment. Dominant left vertebral artery is patent to the skull base with no plaque or stenosis. CTA HEAD Posterior circulation: Dominant left vertebral artery supplies the basilar with mild calcified plaque near the left PICA origin. No stenosis. Patent left PICA. Diminutive right vertebral artery terminates in the right PICA. Patent basilar artery with mild irregularity but no significant stenosis. Patent SCA and PCA origins. Fetal type right PCA origin. Left PCA branches are within normal limits. The junction of the right PCA P2/P3 segment is moderately stenotic. See series 15, image 22. But otherwise right PCA branches are within normal limits. Anterior circulation: Both ICA siphons are patent. Mild for age calcified siphon atherosclerosis. Mild supraclinoid  stenosis bilaterally. Normal posterior communicating artery origins. Patent carotid termini. Patent MCA and ACA origins. Diminutive or absent anterior communicating artery. Bilateral ACA branches are within normal limits. Left MCA M1 segment and bifurcation are patent without stenosis. Left MCA branches are within normal limits. Right MCA M1 segment and bifurcation are patent without stenosis. Right MCA branches are within normal limits. Venous sinuses: Early contrast timing, not well evaluated. Anatomic variants: Dominant left vertebral artery supplies the basilar, diminutive right vertebral terminates in PICA. Review of the MIP images confirms the above findings IMPRESSION: 1. Negative for large vessel occlusion. Minimal for age atherosclerosis in the neck. 2. Mild for age bilateral ICA siphon calcified plaque and supraclinoid stenosis. Moderate stenosis of the Right PCA P2/P3 junction. 3.  Stable non contrast CT appearance of the brain. 4.  Aortic Atherosclerosis (ICD10-I70.0). Electronically Signed   By: Genevie Ann M.D.   On: 03/16/2022 11:10   MR BRAIN WO CONTRAST  Result Date: 03/15/2022 CLINICAL DATA:  Multiple falls EXAM: MRI HEAD WITHOUT CONTRAST MRA HEAD WITHOUT CONTRAST TECHNIQUE: Multiplanar, multi-echo pulse sequences of the brain and surrounding structures were acquired without intravenous contrast. Angiographic images of the Circle of Willis were acquired using MRA technique without intravenous contrast. COMPARISON:  03/18/2020 FINDINGS: MRI HEAD FINDINGS Brain: Small acute infarct of the superior left frontal lobe. No acute hemorrhage. No chronic microhemorrhage or siderosis. There is confluent hyperintense T2-weighted signal within the white matter. There is advanced atrophy. Old left frontal infarct. The midline structures are normal. Vascular: Normal flow voids. Skull and upper cervical spine: Normal marrow signal. Sinuses/Orbits: No acute or significant finding. Other: None. MRA HEAD FINDINGS  POSTERIOR CIRCULATION: --Vertebral arteries: Right vertebral artery terminates in PICA. Normal left. --Inferior cerebellar arteries: Normal. --Basilar artery: Normal. --Superior cerebellar arteries: Normal. --Posterior cerebral arteries: Normal. ANTERIOR CIRCULATION: --Intracranial internal carotid arteries: Normal. --Anterior cerebral arteries (ACA): Normal. --Middle cerebral arteries (MCA): Normal. IMPRESSION: 1. Small acute infarct of the superior left frontal lobe. No acute hemorrhage or mass effect. 2. Advanced atrophy and chronic small vessel disease. 3. No emergent large vessel occlusion or high-grade stenosis. Electronically Signed   By: Ulyses Jarred M.D.   On: 03/15/2022 21:04   MR ANGIO HEAD WO CONTRAST  Result Date: 03/15/2022 CLINICAL DATA:  Multiple falls EXAM: MRI HEAD WITHOUT CONTRAST MRA HEAD WITHOUT CONTRAST TECHNIQUE: Multiplanar, multi-echo pulse sequences of the brain and surrounding structures were acquired without intravenous contrast. Angiographic images of the Circle of Willis were acquired using MRA technique without intravenous contrast. COMPARISON:  03/18/2020 FINDINGS: MRI HEAD FINDINGS Brain: Small acute infarct of the superior left frontal lobe. No acute hemorrhage. No chronic microhemorrhage or siderosis. There is  confluent hyperintense T2-weighted signal within the white matter. There is advanced atrophy. Old left frontal infarct. The midline structures are normal. Vascular: Normal flow voids. Skull and upper cervical spine: Normal marrow signal. Sinuses/Orbits: No acute or significant finding. Other: None. MRA HEAD FINDINGS POSTERIOR CIRCULATION: --Vertebral arteries: Right vertebral artery terminates in PICA. Normal left. --Inferior cerebellar arteries: Normal. --Basilar artery: Normal. --Superior cerebellar arteries: Normal. --Posterior cerebral arteries: Normal. ANTERIOR CIRCULATION: --Intracranial internal carotid arteries: Normal. --Anterior cerebral arteries (ACA): Normal.  --Middle cerebral arteries (MCA): Normal. IMPRESSION: 1. Small acute infarct of the superior left frontal lobe. No acute hemorrhage or mass effect. 2. Advanced atrophy and chronic small vessel disease. 3. No emergent large vessel occlusion or high-grade stenosis. Electronically Signed   By: Ulyses Jarred M.D.   On: 03/15/2022 21:04   CT HEAD WO CONTRAST  Result Date: 03/15/2022 CLINICAL DATA:  Fall EXAM: CT HEAD WITHOUT CONTRAST CT CERVICAL SPINE WITHOUT CONTRAST TECHNIQUE: Multidetector CT imaging of the head and cervical spine was performed following the standard protocol without intravenous contrast. Multiplanar CT image reconstructions of the cervical spine were also generated. RADIATION DOSE REDUCTION: This exam was performed according to the departmental dose-optimization program which includes automated exposure control, adjustment of the mA and/or kV according to patient size and/or use of iterative reconstruction technique. COMPARISON:  CT Head 07/12/21 FINDINGS: CT HEAD FINDINGS Brain: Redemonstrated extensive periventricular white matter hypodensity with likely chronic infarcts in the centrum semiovale on the left and the posterior left frontal lobe. No hemorrhage. No extra-axial fluid collection. Unchanged size and shape of the ventricular system. Vascular: No hyperdense vessel or unexpected calcification. Skull: Normal. Negative for fracture or focal lesion. Sinuses/Orbits: Mucosal thickening bilateral maxillary sinuses. Bilateral lens replacement. No middle ear or mastoid effusion. Other: None. CT CERVICAL SPINE FINDINGS Alignment: Normal. Skull base and vertebrae: No acute fracture. No primary bone lesion or focal pathologic process. Soft tissues and spinal canal: No prevertebral fluid or swelling. No visible canal hematoma. Disc levels:  No evidence of high-grade spinal canal stenosis. Upper chest: Negative. Other: None IMPRESSION: 1. No acute intracranial abnormality. 2. No acute fracture or  traumatic listhesis in the cervical spine. Electronically Signed   By: Marin Roberts M.D.   On: 03/15/2022 17:35   CT CERVICAL SPINE WO CONTRAST  Result Date: 03/15/2022 CLINICAL DATA:  Fall EXAM: CT HEAD WITHOUT CONTRAST CT CERVICAL SPINE WITHOUT CONTRAST TECHNIQUE: Multidetector CT imaging of the head and cervical spine was performed following the standard protocol without intravenous contrast. Multiplanar CT image reconstructions of the cervical spine were also generated. RADIATION DOSE REDUCTION: This exam was performed according to the departmental dose-optimization program which includes automated exposure control, adjustment of the mA and/or kV according to patient size and/or use of iterative reconstruction technique. COMPARISON:  CT Head 07/12/21 FINDINGS: CT HEAD FINDINGS Brain: Redemonstrated extensive periventricular white matter hypodensity with likely chronic infarcts in the centrum semiovale on the left and the posterior left frontal lobe. No hemorrhage. No extra-axial fluid collection. Unchanged size and shape of the ventricular system. Vascular: No hyperdense vessel or unexpected calcification. Skull: Normal. Negative for fracture or focal lesion. Sinuses/Orbits: Mucosal thickening bilateral maxillary sinuses. Bilateral lens replacement. No middle ear or mastoid effusion. Other: None. CT CERVICAL SPINE FINDINGS Alignment: Normal. Skull base and vertebrae: No acute fracture. No primary bone lesion or focal pathologic process. Soft tissues and spinal canal: No prevertebral fluid or swelling. No visible canal hematoma. Disc levels:  No evidence of high-grade spinal canal stenosis. Upper  chest: Negative. Other: None IMPRESSION: 1. No acute intracranial abnormality. 2. No acute fracture or traumatic listhesis in the cervical spine. Electronically Signed   By: Marin Roberts M.D.   On: 03/15/2022 17:35    Labs:  Basic Metabolic Panel: Recent Labs  Lab 03/25/22 1118 03/26/22 0735 03/27/22 0711   NA 131* 135 139  K 3.5 3.5 3.6  CL 98 103 104  CO2 '25 26 25  '$ GLUCOSE 104* 114* 104*  BUN '20 18 17  '$ CREATININE 1.54* 1.22 1.21  CALCIUM 8.9 8.9 9.1    CBC: Recent Labs  Lab 03/25/22 1118  WBC 7.0  NEUTROABS 4.7  HGB 13.5  HCT 41.1  MCV 93.2  PLT 254    CBG: No results for input(s): "GLUCAP" in the last 168 hours.  Family history.  Mother with kidney cancer father with CVA.  Denies any colon cancer esophageal cancer or rectal cancer  Brief HPI:   Todd Golden. is a 89 y.o. 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  Per chart review lives with spouse.  Required intermittent assist with ADLs.  [facility]ed [facility] with acute onset of right-sided weakness.  CT/MRI showed small acute infarction of the superior left frontal lobe.  No acute hemorrhage.  MRA of the head showed no emergent large vessel occlusion.  CT angiogram of the neck negative for large vessel occlusion.  Admission chemistries unremarkable.  Echocardiogram with ejection fraction of 40 to 45%.  Neurology follow-up maintained on low-dose aspirin for now due to history of falls.  Lovenox for DVT prophylaxis.  Recommend follow-up outpatient neurology services to discuss anticoagulation.  Therapy evaluations completed due to patient decreased functional mobility right side weakness was admitted for a comprehensive rehab program.   Hospital Course: Todd Golden. was admitted to rehab 03/19/2022 for inpatient therapies to consist of PT, ST and OT at least three hours five days a week. Past admission physiatrist, therapy team and rehab RN have worked together to provide customized collaborative inpatient rehab.  Pertaining to patient's infarction of superior left frontal lobe with right side weakness as well as history of left MCA infarction 2015 with residual aphasia.   Maintain on low-dose aspirin.  Lovenox for DVT prophylaxis.  Patient to follow-up outpatient with neurology services to discuss anticoagulation.  Mood stabilization with Zoloft as well as Aricept.  History of PAF cardiac rate controlled as noted continue low-dose aspirin.  Permissive blood pressure monitored on Cardura as well as Visken.  Crestor ongoing for hyperlipidemia.  History of GERD maintained on Protonix.  Patient did have some urinary frequency suspect BPH maintained on Flomax PVRs were good.  AKI initial creatinine 1.54 improved with encouragement of fluids 1.21.  Mild hyponatremia improved with sodium chloride tablets to 139.   Blood pressures were monitored on TID basis and controlled     Rehab course: During patient's stay in rehab weekly team conferences were held to monitor patient's progress, set goals and discuss barriers to discharge. At admission, patient required minimal assist 160 feet rolling walker moderate assist stand pivot transfers  Physical exam.  Blood pressure 138/90 pulse 86 temperature 97.6 respirations 20 oxygen saturation is 97% room air Constitutional.  No acute distress HEENT Head.  Normocephalic and atraumatic Eyes.  Pupils round and reactive to light no discharge without nystagmus Neck.  Supple nontender no JVD without thyromegaly Cardiac regular rate  and rhythm without any extra sounds or murmur heard Abdomen.  Soft nontender positive bowel sounds without rebound Respiratory effort normal no respiratory distress without wheeze Skin.  Intact Neurologic.  Alert follows command provides name and age but not place or month.  He/She  has had improvement in activity tolerance, balance, postural control as well as ability to compensate for deficits. He/She has had improvement in functional use RUE/LUE  and RLE/LLE as well as improvement in awareness.  Ambulates 250 feet rolling walker contact-guard minimal assist.  Stand step to bed with contact-guard for cues  and positioning.  Requires minimal assist for sit to supine.  Patient able to assist with clothing management with cueing.  Patient walked back to the bedroom with cueing facilitation to address weight shifts.  Able to walk from bathroom to sink then ADL apartment with 1 seated rest break.  Patient was easily distracted.  Full family teaching completed plan discharge to home       Disposition: Discharge to home    Diet: Regular  Special Instructions: No driving smoking or alcohol  Medications at discharge 1.  Tylenol as needed 2.  aspirin 81 mg p.o. daily 3.  Vitamin D 1000 units p.o. daily 4.  Aricept 10 mg p.o. daily 5.  Cardura 4 mg p.o. daily 6.Mentanx 1 tablet daily 7.Xalatan ophthalmic solution 0.005% 1 drop both eyes at bedtime 8.  Magnesium gluconate 250 mg nightly 9.  Multivitamin daily 10.  Protonix 40 mg p.o. daily 11.  Visken 10 mg p.o. daily 12.  Crestor 20 mg p.o. daily 13.  Zoloft 50 mg p.o. daily 14.  Flomax 0.8 mg p.o. daily   30-35 minutes were spent completing discharge summary and discharge planning  Discharge Instructions     Ambulatory referral to Neurology   Complete by: As directed    An appointment is requested in approximately: 4 weeks left MCA infarction   Ambulatory referral to Physical Medicine Rehab   Complete by: As directed    Moderate complexity follow up 1-2 weeks left MCA infarction        Follow-up Information     Raulkar, Clide Deutscher, MD Follow up.   Specialty: Physical Medicine and Rehabilitation Why: Office to call for appointment Contact information: [id number] [place] [address] [phone]                 Signed: Cathlyn Parsons 03/29/2022, 5:19 AM

## 2022-03-27 NOTE — Progress Notes (Signed)
Patient ID: Todd Golden., male   DOB: 1930/12/18, 89 y.o.   MRN: [id number]  Met with pt and wife wjo is here for family training and it has gone well. Aware of goals of CGA-supervision and discharge date 3/8. Pt still wants to discharge sooner but has agreed to Friday. Discussed home health arranged via [facility] and they will also assist with some custodial care included in his HTA policy. Have ordered hospital bed and 3 in 1 via [employer] and wife to make the co-pays so can set up delivery of equipment. Will work toward discharge. Also gave wife a private duty agency list for hired assist if feels needs this once home

## 2022-03-27 NOTE — Patient Care Conference (Signed)
Inpatient RehabilitationTeam Conference and Plan of Care Update Date: 03/27/2022   Time: 11:15 AM    Patient Name: Todd Golden.      Medical Record Number: [id number]  Date of Birth: 02/20/1930 Sex: Male         Room/Bed: 4W10C/4W10C-01 Payor Info: Payor: Jed [employer]imer[employer] ADVANTAGE / Plan: [employer] Must PPO / Product Type: *No Product type* /    Admit Date/Time:  03/19/2022  5:32 PM  Primary Diagnosis:  Left middle cerebral artery stroke [facility])  Hospital Problems: Principal Problem:   Left middle cerebral artery stroke [facility])    Expected Discharge Date: Expected Discharge Date: 03/29/22  Team Members Present: Physician leading conference: Dr. Leeroy Cha Social Worker Present: Ovidio Kin, LCSW Nurse Present: Dorien Chihuahua, RN PT Present: Ginnie Smart, PT OT Present: Other (comment) Antony Salmon, OT) SLP Present: Helaine Chess, SLP PPS Coordinator present : Gunnar Fusi, SLP     Current Status/Progress Goal Weekly Team Focus  Bowel/Bladder   Continent of B/B. LBM 03/26/22   Maintain continence   Assess QS    Swallow/Nutrition/ Hydration               ADL's   Mod assist   Min assist   Improve safety with functional mobility, balance during ADL    Mobility   CGA to minA   CGA to supervision  Participation, safety awareness, dynamic standing balance, caregiver ed, DC planning    Communication                Safety/Cognition/ Behavioral Observations               Pain   Denies pain   Remain pain free   Assess Q$ and prn    Skin   Skin intact   Maintain skin integrity  Assess QS and prn      Discharge Planning:  wife here for family education today-aware pt requesting to go home but wants to be able to manage him. She is the sole caregiver-daughter and son have health issues and will check on them at home   Team Discussion: Patient with right flank pain; k-pad added. Labs off, IVF added for hydration. Urinary frequency noted;  UA WNL.  Concerns with home set up; children are impaired and cannot assist. Wife reports near baseline except for weakness.  Patient on target to meet rehab goals: yes, currently at goal level CGA - supervision overall with CGA for ambulation using a RW.   *See Care Plan and progress notes for long and short-term goals.   Revisions to Treatment Plan:  N/a   Teaching Needs: Safety, medications, dietary modifications, transfers, toileting, etc.  Current Barriers to Discharge: Decreased caregiver support and Home enviroment access/layout  Possible Resolutions to Barriers: Family education HH follow up services DME: [facility] Education packet for SLP recommendations     Medical Summary Current Status: overweight, dementia, right sided kidney stent, ocasional right sided flank pain, increase in creatinine this week, HTN, urinary retention    Barriers to Discharge Comments: overweight, dementia, right sided kidney stent, ocasional right sided flank pain, increase in creatinine this week, HTN, urinary retention Possible Resolutions to [employer]: provide dietary education, monitored kidney function, kpad ordered, amlodipine d/ced, will increase flomax, UA ordered and reviewed and is stable   Continued Need for Acute Rehabilitation Level of Care: The patient requires daily medical management by a physician with specialized training in physical medicine and rehabilitation for the following reasons: Direction of a multidisciplinary  physical rehabilitation program to maximize functional independence : Yes Medical management of patient stability for increased activity during participation in an intensive rehabilitation regime.: Yes Analysis of laboratory values and/or radiology reports with any subsequent need for medication adjustment and/or medical intervention. : Yes   I attest that I was present, lead the team conference, and concur with the assessment and plan of the  team.   Dorien Chihuahua B 03/27/2022, 2:16 PM

## 2022-03-27 NOTE — Progress Notes (Signed)
Patient laying in bed. Denies pain or discomfort.

## 2022-03-28 MED ORDER — VITAMIN D 25 MCG (1000 UNIT) PO TABS: 1000.0000 [IU] | ORAL_TABLET | Freq: Every day | ORAL | 0 refills | Status: AC

## 2022-03-28 MED ORDER — DOXAZOSIN MESYLATE 4 MG PO TABS: 4.0000 mg | ORAL_TABLET | Freq: Every day | ORAL | 0 refills | Status: DC

## 2022-03-28 MED ORDER — L-METHYLFOLATE-B6-B12 3-35-2 MG PO TABS: 1.0000 | ORAL_TABLET | Freq: Every day | ORAL | 0 refills | Status: AC

## 2022-03-28 MED ORDER — DONEPEZIL HCL 10 MG PO TABS: 10.0000 mg | ORAL_TABLET | Freq: Every day | ORAL | 0 refills | Status: DC

## 2022-03-28 MED ORDER — OMEPRAZOLE 40 MG PO CPDR: DELAYED_RELEASE_CAPSULE | ORAL | 3 refills | Status: DC

## 2022-03-28 MED ORDER — TAMSULOSIN HCL 0.4 MG PO CAPS: 0.8000 mg | ORAL_CAPSULE | Freq: Every day | ORAL | 0 refills | Status: DC

## 2022-03-28 MED ORDER — SERTRALINE HCL 50 MG PO TABS: 50.0000 mg | ORAL_TABLET | Freq: Every day | ORAL | 0 refills | Status: DC

## 2022-03-28 MED ORDER — PINDOLOL 10 MG PO TABS: ORAL_TABLET | ORAL | 1 refills | Status: DC

## 2022-03-28 MED ORDER — MAGNESIUM GLUCONATE 500 MG PO TABS: 250.0000 mg | ORAL_TABLET | Freq: Every day | ORAL | 0 refills | Status: AC

## 2022-03-28 MED ORDER — ACETAMINOPHEN 325 MG PO TABS: 650.0000 mg | ORAL_TABLET | Freq: Four times a day (QID) | ORAL | Status: AC | PRN

## 2022-03-28 MED ORDER — ROSUVASTATIN CALCIUM 20 MG PO TABS: 20.0000 mg | ORAL_TABLET | Freq: Every day | ORAL | 0 refills | Status: DC

## 2022-03-28 NOTE — Progress Notes (Addendum)
Inpatient Rehabilitation Discharge Medication Review by a Pharmacist  A complete drug regimen review was completed for this patient to identify any potential clinically significant medication issues.  High Risk Drug Classes Is patient taking? Indication by Medication  Antipsychotic No   Anticoagulant No   Antibiotic No   Opioid No   Antiplatelet Yes ASA - stroke ppx  Hypoglycemics/insulin No   Vasoactive Medication Yes Pindolol - HTN Cardura- HTN  Flomax- BPH  Chemotherapy No   Other Yes Trazodone - sleep Prilosec - GERD Aricept- Alzheimer's/Memory support Crestor- HLD Zoloft- MDD     Type of Medication Issue Identified Description of Issue Recommendation(s)  Drug Interaction(s) (clinically significant)     Duplicate Therapy     Allergy     No Medication Administration End Date     Incorrect Dose     Additional Drug Therapy Needed     Significant med changes from prior encounter (inform family/care partners about these prior to discharge).    Other       Clinically significant medication issues were identified that warrant physician communication and completion of prescribed/recommended actions by midnight of the next day:  No   Time spent performing this drug regimen review (minutes):  30   Chesley Noon, PharmD Candidate 03/28/2022 7:47 AM

## 2022-03-28 NOTE — Progress Notes (Signed)
Slept well last night. Denies pain. Family spent the night with patient. Due specimen collection for GI panel. Remains on proactive enteric precautions. Safety maintained at all times.

## 2022-03-28 NOTE — Progress Notes (Signed)
Physical Therapy Discharge Summary  Patient Details  Name: Todd Golden. MRN: [id number] Date of Birth: [birth year]  Date of Discharge from PT service:March 28, 2022  Today's Date: 03/28/2022 PT Individual Time: [id number] PT Individual Time Calculation (min): 60 min    Patient has met 9 of 9 long term goals due to improved activity tolerance, improved balance, improved postural control, increased strength, ability to compensate for deficits, improved attention, and improved awareness.  Patient to discharge at an ambulatory level  CGA .   Patient's care partner is independent to provide the necessary physical and cognitive assistance at discharge. [name], wife, present for hands on family training/education and demonstrated adequate ability to manage patient physically and cognitively. BERG score improved from 8/56 to 14/56 - still indicative of increased falls risk.   Reasons goals not met: n/a  Recommendation:  Patient will benefit from ongoing skilled PT services in home health setting to continue to advance safe functional mobility, address ongoing impairments in dynamic standing balance, home safety caregiver training, fall prevention, and minimize fall risk.  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 Rarely or not at all Pain Interference with Therapy Activities: 1. Rarely or not at all Pain Interference with Day-to-Day Activities: 1. Rarely or not at all Vision/Perception  Vision -  History Ability to See in Adequate Light: 1 Impaired Vision - Assessment Eye Alignment: Within Functional Limits Ocular Range of Motion: Restricted on the right;Restricted on the left;Restricted looking up;Restricted looking down Alignment/Gaze Preference: Within Defined Limits Tracking/Visual Pursuits: Decreased smoothness of horizontal tracking;Decreased smoothness of vertical tracking Saccades: Decreased speed of saccadic movement;Impaired - to be further tested in functional context Convergence: Impaired (comment) (no effort to converge when instructed) Perception Perception: Impaired Spatial Orientation: impaired Praxis Praxis: Impaired Praxis Impairment Details: Motor planning;Initiation  Cognition Overall Cognitive Status: History of cognitive impairments - at baseline Arousal/Alertness: Awake/alert Orientation Level: Oriented to person;Oriented to place Attention: Focused;Sustained;Selective Focused Attention: Appears intact Sustained Attention: Appears intact Selective Attention: Appears intact Memory: Impaired Memory Impairment: Storage deficit;Decreased recall of new information;Decreased long term memory;Decreased short term memory Decreased Long Term Memory: Functional basic;Verbal basic Decreased Short Term Memory: Functional basic;Verbal basic Awareness: Impaired Awareness Impairment: Emergent impairment Problem Solving: Impaired Problem Solving Impairment: Verbal basic Self Monitoring: Impaired Self Monitoring Impairment: Functional basic Behaviors: Poor frustration tolerance;Impulsive Safety/Judgment: Impaired Comments: Decreased safety awareness and insight into impairments and limitations Sensation Sensation Light Touch: Appears Intact Hot/Cold: Appears Intact Proprioception: Impaired by gross assessment Stereognosis: Not tested Additional Comments: Reports baseline numbness and tingling in both hands/fingers. Coordination Gross Motor Movements are Fluid  and Coordinated: No Fine Motor Movements are Fluid and Coordinated: No Coordination and Movement Description: Impaired due to impaired motor planning, dynamic balance, and awareness deficits. Finger Nose Finger Test: overshooting bilaterally Motor  Motor Motor: Hemiplegia;Motor apraxia;Motor perseverations;Abnormal postural alignment and control;Motor impersistence Motor - Discharge Observations: mild R hemi - poorly timed motor activation with some apraxia  Mobility Bed Mobility Bed Mobility: Supine to Sit;Sit to Supine Supine to Sit: Supervision/Verbal cueing Sit to Supine: Supervision/Verbal cueing Transfers Transfers: Sit to Stand;Stand to Sit;Stand Pivot Transfers Sit to Stand:  Supervision/Verbal cueing Stand to Sit: Supervision/Verbal cueing Stand Pivot Transfers: Contact Guard/Touching assist Stand Pivot Transfer Details: Verbal cues for sequencing;Verbal cues for gait pattern;Verbal cues for precautions/safety;Tactile cues for initiation;Tactile cues for sequencing;Tactile cues for posture Transfer (Assistive device): Rolling walker Locomotion  Gait Ambulation: Yes Gait Assistance: Contact Guard/Touching assist Gait Distance (Feet): 150 Feet Assistive device: Rolling walker Gait Assistance Details: Verbal cues for technique;Verbal cues for sequencing;Verbal cues for precautions/safety;Verbal cues for gait pattern;Verbal cues for safe use of DME/AE;Tactile cues for initiation;Tactile cues for sequencing;Tactile cues for weight shifting;Tactile cues for posture;Tactile cues for placement;Visual cues/gestures for sequencing Gait Gait: Yes Gait Pattern: Impaired Gait Pattern: Decreased step length - right;Decreased step length - left;Decreased stance time - right;Decreased stance time - left;Decreased stride length;Decreased dorsiflexion - left;Decreased weight shift to right;Decreased weight shift to left;Shuffle;Festinating;Poor foot clearance - left;Poor foot clearance -  right;Narrow base of support Stairs / Additional Locomotion Stairs: Yes Stairs Assistance: Contact Guard/Touching assist Stair Management Technique: Two rails;Alternating pattern;Step to pattern (pt self selected step to vs reciprocal stepping) Number of Stairs: 12 Height of Stairs:  (combination of 6inch and 3inch stairs) Ramp: Contact Guard/touching assist Pick up small object from the floor assist level: Minimal Assistance - Patient > 75% Pick up small object from the floor assistive device: with RW Wheelchair Mobility Wheelchair Mobility: No  Trunk/Postural Assessment  Cervical Assessment Cervical Assessment: Exceptions to [facility] (forward head) Thoracic Assessment Thoracic Assessment: Exceptions to [facility] (rounded shoulders) Lumbar Assessment Lumbar Assessment: Exceptions to [facility] (posterior tilt - rigid) Postural Control Postural Control: Deficits on evaluation Trunk Control: posterior bias - limited forward weight shift (maybe contributed by fear of falling)  Balance Berg Balance Test Sit to Stand: Able to stand using hands after several tries Standing Unsupported: Able to stand 30 seconds unsupported Sitting with Back Unsupported but Feet Supported on Floor or Stool: Able to sit safely and securely 2 minutes Stand to Sit: Uses backs of legs against chair to control descent Transfers: Needs one person to assist Standing Unsupported with Eyes Closed: Needs help to keep from falling Standing Ubsupported with Feet Together: Needs help to attain position and unable to hold for 15 seconds From Standing, Reach Forward with Outstretched Arm: Reaches forward but needs supervision From Standing Position, Pick up Object from Floor: Unable to pick up shoe, but reaches 2-5 cm (1-2") from shoe and balances independently From Standing Position, Turn to Look Behind Over each Shoulder: Needs assist to keep from losing balance and falling Turn 360 Degrees: Needs assistance while turning Standing  Unsupported, Alternately Place Feet on Step/Stool: Needs assistance to keep from falling or unable to try Standing Unsupported, One Foot in Front: Loses balance while stepping or standing Standing on One Leg: Unable to try or needs assist to prevent fall Total Score: 14 Extremity Assessment      RLE Assessment RLE Assessment: Exceptions to [facility] General Strength Comments: Grossly 4/5 LLE Assessment LLE Assessment: Exceptions to [facility] General Strength Comments: Grossly 4+/5   Skilled Intervention: Pt sitting in w/c - needing encouragement to participate in therapy session. Pt reports he's discharging today and doesn't need to do therapy - reminded he discharges home tomorrow and has a full day of therapies. Patient also confused because he believed he couldn't leave his room due to enteric precautions. Spoke with RN to confirm and patient is under these precautions until lab results return. Gym equipment and environment wiped with bleach wipes as able.   He ambulated from his room to main  rehab gym, ~1107f, with CGA and RW - ocassional foot drag on L > R with cues needed for lengthening stride length and increasing B step height. Continued gait training in rehab gyms - emphasis on safety with turns, safety with RW management, and intrinsic feedback for when he lacks adequate step length/height.  Stair training using combination of 6inch + 3inch steps with CGA and 2 hand rails - navigated x12 total with patient self selected step-to vs reciprocal stepping (minimal receptiveness to safety cues)  HEP developed and print out provided - left in patient's CVA take home binder for caregiver to follow up with.  Access Code: [email or web address] URL: [email or web address] Date: 03/28/2022 Prepared by: CGinnie Smart Exercises - Sit to Stand with Counter Support  - 1 x daily - 7 x weekly - 3 sets - 10 reps - Standing March with Counter Support  - 1 x daily - 7 x weekly - 3 sets - 10 reps -  Standing Hip Abduction with Counter Support  - 1 x daily - 7 x weekly - 3 sets - 10 reps - Heel Raises with Counter Support  - 1 x daily - 7 x weekly - 3 sets - 10 reps - Mini Squat with Counter Support  - 1 x daily - 7 x weekly - 3 sets - 10 reps - Side Stepping with Counter Support  - 1 x daily - 7 x weekly - 3 sets - 10 reps - Wide Stance with Counter Support  - 1 x daily - 7 x weekly - 3 sets - 10 reps - Narrow Stance with Counter Support  - 1 x daily - 7 x weekly - 3 sets - 10 reps   Pt ambulated back to his room (needing directional cues, unable to recall or problem solve how to return) with CGA and RW. Bed mobility completed with supervision. Alarm on and all needs met.     Siera Beyersdorf P Amaiyah Nordhoff PT, DPT 03/28/2022, 10:54 AM

## 2022-03-28 NOTE — Progress Notes (Signed)
Occupational Therapy Session Note  Patient Details  Name: Todd Golden. MRN: [id number] Date of Birth: July 31, 1930  Today's Date: 03/28/2022 OT Individual Time: [id number] OT Individual Time Calculation (min): 27 min  Pt missed 18 mins d/t fatigue   Short Term Goals: Week 1:  OT Short Term Goal 1 (Week 1): Pt will maintain dynamic sitting balance EOB 3-5 minutes without UE or trunk support CGA OT Short Term Goal 1 - Progress (Week 1): Met OT Short Term Goal 2 (Week 1): Pt will maitian dynamic standing balance CGA using LRAD OT Short Term Goal 2 - Progress (Week 1): Met OT Short Term Goal 3 (Week 1): Pt will demonstrated increased safety awareness by initaiting use of LRAD during all transfers with min cueing OT Short Term Goal 3 - Progress (Week 1): Met  Skilled Therapeutic Interventions/Progress Updates:  Pts wife in hallway asking OTA to assist with taking pt to bathroom.  Pt completed functional ambulation to bathroom with RW and MINA. Pt able to manage pants but unable to void BM despite excessive time on toilet. Pt completed functional ambulation back to bed with RW and MINA.   Able to complete required DC assessments as indicated below. Reviewed pts level of assist with pts wife and offered to provide additional family education/hands on training with wife reporting, "we've been doing this for a while."   Ended session with pt supine in bed with all needs within reach and bed alarm activated.                    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), significant fall history, poor safety awareness and insight Restrictions Weight Bearing Restrictions: No  Pain: no pain    Vision Baseline Vision/History: 0 No visual deficits;6 Macular Degeneration Patient Visual Report: Blurring of vision;Other (comment) (difficulty seeing far away - premorbid) Vision Assessment?: Yes Eye  Alignment: Within Functional Limits Ocular Range of Motion: Restricted on the right;Restricted on the left;Restricted looking up;Restricted looking down Alignment/Gaze Preference: Within Defined Limits Tracking/Visual Pursuits: Decreased smoothness of horizontal tracking;Decreased smoothness of vertical tracking Saccades: Decreased speed of saccadic movement;Impaired - to be further tested in functional context Convergence: Impaired (comment) (likely d/t cog) Visual Fields: No apparent deficits Depth Perception: Overshoots (FTN) Perception  Perception: Impaired Spatial Orientation: impaired Praxis Praxis: Impaired Praxis Impairment Details: Motor planning;Initiation Cognition Cognition Overall Cognitive Status: History of cognitive impairments - at baseline Arousal/Alertness: Awake/alert Memory: Impaired Memory Impairment: Storage deficit;Decreased recall of new information;Decreased long term memory;Decreased short term memory Decreased Long Term Memory: Functional basic;Verbal basic Decreased Short Term Memory: Functional basic;Verbal basic Attention: Focused;Sustained;Selective Focused Attention: Appears intact Sustained Attention: Appears intact Selective Attention: Appears intact Awareness: Impaired Awareness Impairment: Emergent impairment Problem Solving: Impaired Problem Solving Impairment: Verbal basic Self Monitoring: Impaired Self Monitoring Impairment: Functional basic Behaviors: Poor frustration tolerance;Impulsive Safety/Judgment: Impaired Comments: Decreased safety awareness and insight into impairments and limitations Brief Interview for Mental Status (BIMS) Repetition of Three Words (First Attempt): 3 Temporal Orientation: Year: Correct (when 2 choices provided) Temporal Orientation: Month: Accurate within 5 days Temporal Orientation: Day: Correct Recall: "Sock": Yes, after cueing ("something to wear") Recall: "Blue": Yes, after cueing ("a color") Recall:  "Bed": Yes, after cueing ("a piece of furniture") BIMS Summary Score: 12 Sensation Sensation Light Touch: Appears Intact Hot/Cold: Appears Intact Proprioception: Impaired by gross assessment Stereognosis: Not tested Additional Comments: Reports baseline numbness and tingling in both hands/fingers. Coordination Gross Motor Movements  are Fluid and Coordinated: No Fine Motor Movements are Fluid and Coordinated: No Coordination and Movement Description: Impaired due to impaired motor planning, dynamic balance, and awareness deficits. Finger Nose Finger Test: overshooting bilaterally Motor  Motor Motor: Hemiplegia;Motor apraxia;Motor perseverations;Abnormal postural alignment and control;Motor impersistence Motor - Discharge Observations: mild R hemi - poorly timed motor activation with some apraxia Mobility  Bed Mobility Bed Mobility: Supine to Sit;Sit to Supine Supine to Sit: Supervision/Verbal cueing Sit to Supine: Supervision/Verbal cueing Transfers Sit to Stand: Supervision/Verbal cueing Stand to Sit: Supervision/Verbal cueing  Trunk/Postural Assessment  Cervical Assessment Cervical Assessment: Exceptions to [facility] (forward head) Thoracic Assessment Thoracic Assessment: Exceptions to [facility] (rounded shoulders) Lumbar Assessment Lumbar Assessment: Exceptions to [facility] (posterior tilt - rigid) Postural Control Postural Control: Deficits on evaluation Trunk Control: posterior bias - limited forward weight shift (maybe contributed by fear of falling)  Balance Berg Balance Test Sit to Stand: Able to stand using hands after several tries Standing Unsupported: Able to stand 30 seconds unsupported Sitting with Back Unsupported but Feet Supported on Floor or Stool: Able to sit safely and securely 2 minutes Stand to Sit: Uses backs of legs against chair to control descent Transfers: Needs one person to assist Standing Unsupported with Eyes Closed: Needs help to keep from falling Standing  Ubsupported with Feet Together: Needs help to attain position and unable to hold for 15 seconds From Standing, Reach Forward with Outstretched Arm: Reaches forward but needs supervision From Standing Position, Pick up Object from Floor: Unable to pick up shoe, but reaches 2-5 cm (1-2") from shoe and balances independently From Standing Position, Turn to Look Behind Over each Shoulder: Needs assist to keep from losing balance and falling Turn 360 Degrees: Needs assistance while turning Standing Unsupported, Alternately Place Feet on Step/Stool: Needs assistance to keep from falling or unable to try Standing Unsupported, One Foot in Front: Loses balance while stepping or standing Standing on One Leg: Unable to try or needs assist to prevent fall Total Score: 14 Extremity/Trunk Assessment RUE Assessment RUE Assessment: Within Functional Limits Active Range of Motion (AROM) Comments: limited shoudler flexion ~90 degrees General Strength Comments: 4/5 LUE Assessment Active Range of Motion (AROM) Comments: limited shoudler flexion ~90 degrees General Strength Comments: 4/5   Therapy/Group: Individual Therapy  Precious Haws 03/28/2022, 3:16 PM

## 2022-03-28 NOTE — Progress Notes (Signed)
Physical Therapy Session Note  Patient Details  Name: Todd Golden. MRN: [id number] Date of Birth: [birth year]  Today's Date: 03/28/2022 PT Individual Time: 0906-1000 PT Individual Time Calculation (min): 54 min   Short Term Goals: Week 1:  PT Short Term Goal 1 (Week 1): Pt will perform supine<>sit with min assist PT Short Term Goal 2 (Week 1): Pt will perform sit<>stand transfers using LRAD with min assist PT Short Term Goal 3 (Week 1): Pt will perform bed<>chair transfers using LRAD with min assist PT Short Term Goal 4 (Week 1): Pt will ambulate at least 77f using LRAD with consistent min assist PT Short Term Goal 5 (Week 1): Pt will participate in a standardized outcome measure to assess his fall risk  Skilled Therapeutic Interventions/Progress Updates: Pt presented in w/c agreeable to therapy. Pt denies pain during session. Session focused on ambulation, transfers, and balance in preparation for d/c. Pt provided with RW and ambulated in room ~1276fwith CGA with verbal cues for safety with RW when performing turns. Pt returned to w/c and with increased time participated in [place] assessment with improved score of 14/56 however continues to demonstrate significant risk of falls. Pt demonstrated moderate posterior lean in standing and was able to correct with heavy multimodal cues however was unable to maintain. Pt also participated in ambulation and reaching tasks with other PTA requiring overall CGA. Pt performed additional ambulation in room ~7521fith RW then sat at EOB. Pt was able to express fatigue with this therapist assisting in stand pivot transfer with AD back to w/c. Pt left in w/c at end of session with belt alarm on, call bell within reach and needs met.      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), significant fall history, poor safety awareness and  insight Restrictions Weight Bearing Restrictions: No General:   Vital Signs:  Pain:   Mobility:   Locomotion :    Trunk/Postural Assessment :    Balance: Berg Balance Test Sit to Stand: Able to stand using hands after several tries Standing Unsupported: Able to stand 30 seconds unsupported Sitting with Back Unsupported but Feet Supported on Floor or Stool: Able to sit safely and securely 2 minutes Stand to Sit: Uses backs of legs against chair to control descent Transfers: Needs one person to assist Standing Unsupported with Eyes Closed: Needs help to keep from falling Standing Ubsupported with Feet Together: Needs help to attain position and unable to hold for 15 seconds From Standing, Reach Forward with Outstretched Arm: Reaches forward but needs supervision From Standing Position, Pick up Object from Floor: Unable to pick up shoe, but reaches 2-5 cm (1-2") from shoe and balances independently From Standing Position, Turn to Look Behind Over each Shoulder: Needs assist to keep from losing balance and falling Turn 360 Degrees: Needs assistance while turning Standing Unsupported, Alternately Place Feet on Step/Stool: Needs assistance to keep from falling or unable to try Standing Unsupported, One Foot in Front: Loses balance while stepping or standing Standing on One Leg: Unable to try or needs assist to prevent fall Total Score: 14 Exercises:   Other Treatments:      Therapy/Group: Individual Therapy  Metta Koranda 03/28/2022, 12:42 PM

## 2022-03-28 NOTE — Progress Notes (Signed)
Occupational Therapy Discharge Summary  Patient Details  Name: Todd Golden. MRN: [id number] Date of Birth: Jun 12, 1930  Date of Discharge from [facility] service:March 28, 2022    Patient has met 8 of 9 long term goals due to improved activity tolerance, improved balance, and improved coordination.  Patient to discharge at [facility] Assist level.  Patient's care partner is independent to provide the necessary physical and cognitive assistance at discharge.    Reasons goals not met: No change noted in awareness  Recommendation:  Patient will benefit from ongoing skilled OT services in home health setting to continue to advance functional skills in the area of BADL.  Equipment: No equipment provided  Reasons for discharge: treatment goals met and discharge from hospital  Patient/family agrees with progress made and goals achieved: Yes  OT Discharge Precautions/Restrictions   Fall  Pain  No pain ADL ADL Eating: Supervision/safety Where Assessed-Eating: Edge of bed Grooming: Supervision/safety Where Assessed-Grooming: Sitting at sink Upper Body Bathing: Minimal assistance Where Assessed-Upper Body Bathing: Sitting at sink Lower Body Bathing: Moderate assistance Where Assessed-Lower Body Bathing: Sitting at sink Upper Body Dressing: Minimal assistance Where Assessed-Upper Body Dressing: Edge of bed Lower Body Dressing: Maximal assistance Where Assessed-Lower Body Dressing: Edge of bed Toileting: Moderate assistance, Maximal assistance Where Assessed-Toileting: Glass blower/designer: Minimal assistance, Moderate assistance (min A using RW) Toilet Transfer Method: Counselling psychologist: Energy manager: Not assessed Social research officer, government: Moderate assistance Social research officer, government Method: Heritage manager: Gaffer Baseline Vision/History: 0 No visual deficits;6 Macular Degeneration Patient Visual  Report: Blurring of vision;Other (comment) (difficulty seeing far away - premorbid) Vision Assessment?: Yes Eye Alignment: Within Functional Limits Ocular Range of Motion: Restricted on the right;Restricted on the left;Restricted looking up;Restricted looking down Alignment/Gaze Preference: Within Defined Limits Tracking/Visual Pursuits: Decreased smoothness of horizontal tracking;Decreased smoothness of vertical tracking Saccades: Decreased speed of saccadic movement;Impaired - to be further tested in functional context Convergence: Impaired (comment) (likely d/t cog) Visual Fields: No apparent deficits Depth Perception: Overshoots (FTN) Perception  Perception: Impaired Spatial Orientation: impaired Praxis Praxis: Impaired Praxis Impairment Details: Motor planning;Initiation Cognition Cognition Overall Cognitive Status: History of cognitive impairments - at baseline Arousal/Alertness: Awake/alert Memory: Impaired Memory Impairment: Storage deficit;Decreased recall of new information;Decreased long term memory;Decreased short term memory Decreased Long Term Memory: Functional basic;Verbal basic Decreased Short Term Memory: Functional basic;Verbal basic Attention: Focused;Sustained;Selective Focused Attention: Appears intact Sustained Attention: Appears intact Selective Attention: Appears intact Awareness: Impaired Awareness Impairment: Emergent impairment Problem Solving: Impaired Problem Solving Impairment: Verbal basic Self Monitoring: Impaired Self Monitoring Impairment: Functional basic Behaviors: Poor frustration tolerance;Impulsive Safety/Judgment: Impaired Comments: Decreased safety awareness and insight into impairments and limitations Brief Interview for Mental Status (BIMS) Repetition of Three Words (First Attempt): 3 Temporal Orientation: Year: Correct (when 2 choices provided) Temporal Orientation: Month: Accurate within 5 days Temporal Orientation: Day:  Correct Recall: "Sock": Yes, after cueing ("something to wear") Recall: "Blue": Yes, after cueing ("a color") Recall: "Bed": Yes, after cueing ("a piece of furniture") BIMS Summary Score: 12 Sensation Sensation Light Touch: Appears Intact Hot/Cold: Appears Intact Proprioception: Impaired by gross assessment Stereognosis: Not tested Additional Comments: Reports baseline numbness and tingling in both hands/fingers. Coordination Gross Motor Movements are Fluid and Coordinated: No Fine Motor Movements are Fluid and Coordinated: No Coordination and Movement Description: Impaired due to impaired motor planning, dynamic balance, and awareness deficits. Finger Nose Finger Test: overshooting bilaterally Motor  Motor Motor: Hemiplegia;Motor apraxia;Motor perseverations;Abnormal postural alignment and  control;Motor impersistence Motor - Discharge Observations: mild R hemi - poorly timed motor activation with some apraxia Mobility  Bed Mobility Bed Mobility: Supine to Sit;Sit to Supine Supine to Sit: Supervision/Verbal cueing Sit to Supine: Supervision/Verbal cueing Transfers Sit to Stand: Supervision/Verbal cueing Stand to Sit: Supervision/Verbal cueing  Trunk/Postural Assessment  Cervical Assessment Cervical Assessment: Exceptions to [facility] (forward head) Thoracic Assessment Thoracic Assessment: Exceptions to [facility] (rounded shoulders) Lumbar Assessment Lumbar Assessment: Exceptions to [facility] (posterior tilt - rigid) Postural Control Postural Control: Deficits on evaluation Trunk Control: posterior bias - limited forward weight shift (maybe contributed by fear of falling)  Balance Berg Balance Test Sit to Stand: Able to stand using hands after several tries Standing Unsupported: Able to stand 30 seconds unsupported Sitting with Back Unsupported but Feet Supported on Floor or Stool: Able to sit safely and securely 2 minutes Stand to Sit: Uses backs of legs against chair to control  descent Transfers: Needs one person to assist Standing Unsupported with Eyes Closed: Needs help to keep from falling Standing Ubsupported with Feet Together: Needs help to attain position and unable to hold for 15 seconds From Standing, Reach Forward with Outstretched Arm: Reaches forward but needs supervision From Standing Position, Pick up Object from Floor: Unable to pick up shoe, but reaches 2-5 cm (1-2") from shoe and balances independently From Standing Position, Turn to Look Behind Over each Shoulder: Needs assist to keep from losing balance and falling Turn 360 Degrees: Needs assistance while turning Standing Unsupported, Alternately Place Feet on Step/Stool: Needs assistance to keep from falling or unable to try Standing Unsupported, One Foot in Front: Loses balance while stepping or standing Standing on One Leg: Unable to try or needs assist to prevent fall Total Score: 14 Extremity/Trunk Assessment RUE Assessment RUE Assessment: Within Functional Limits Active Range of Motion (AROM) Comments: limited shoudler flexion ~90 degrees General Strength Comments: 4/5 LUE Assessment Active Range of Motion (AROM) Comments: limited shoudler flexion ~90 degrees General Strength Comments: 4/5   Precious Haws 03/28/2022, 12:38 PM

## 2022-03-28 NOTE — Progress Notes (Signed)
PROGRESS NOTE   Subjective/Complaints: No new complaints this morning Has not had BM since yesterday- will d/c enteric precautions since patient is asymptomatic for norovirus  ROS: +urinary frequency reported by nursing over the weekend- improved Sunday but continued last night, right flank pain improved, denies diarrhea Objective:   No results found. No results for input(s): "WBC", "HGB", "HCT", "PLT" in the last 72 hours.   Recent Labs    03/26/22 0735 03/27/22 0711  NA 135 139  K 3.5 3.6  CL 103 104  CO2 26 25  GLUCOSE 114* 104*  BUN 18 17  CREATININE 1.22 1.21  CALCIUM 8.9 9.1    Intake/Output Summary (Last 24 hours) at 03/28/2022 1709 Last data filed at 03/28/2022 0900 Gross per 24 hour  Intake 480 ml  Output --  Net 480 ml        Physical Exam: Vital Signs Blood pressure 130/87, pulse 79, temperature 97.6 F (36.4 C), temperature source Oral, resp. rate 17, height '5\' 11"'$  (1.803 m), weight 82.5 kg, SpO2 98 %.  Gen: no distress, normal appearing, BMI 25.37 HEENT: oral mucosa pink and moist, NCAT Cardio: Reg rate Chest: normal effort, normal rate of breathing Abd: soft, non-distended Ext: no edema Psych: pleasant, normal affect Skin: intact Skin: No evidence of breakdown, no evidence of rash Neurologi motor strength is 5/5 in bilateral deltoid, bicep, tricep, grip, hip flexor, knee extensors, ankle dorsiflexor and plantar flexor Able to follow commands and provide yes and no answers or head nods   Musculoskeletal: Full range of motion in all 4 extremities. No joint swelling Right flank TTP- improved  Neurological:     Comments: Patient is alert. Contact with examiner.  He can provide his name but not place or month. Poor standing balance. Ambulating MinA with RW. Expressive >receptive aphasia  Assessment/Plan: 1. Functional deficits which require 3+ hours per day of interdisciplinary therapy in a  comprehensive inpatient rehab setting. Physiatrist is providing close team supervision and 24 hour management of active medical problems listed below. Physiatrist and rehab team continue to assess barriers to discharge/monitor patient progress toward functional and medical goals  Care Tool:  Bathing    Body parts bathed by patient: Right arm, Left arm, Chest, Abdomen, Right upper leg, Left upper leg, Right lower leg, Left lower leg, Face   Body parts bathed by helper: Front perineal area, Buttocks     Bathing assist Assist Level: Moderate Assistance - Patient 50 - 74%     Upper Body Dressing/Undressing Upper body dressing   What is the patient wearing?: Pull over shirt    Upper body assist Assist Level: Minimal Assistance - Patient > 75%    Lower Body Dressing/Undressing Lower body dressing      What is the patient wearing?: Incontinence brief, Pants     Lower body assist Assist for lower body dressing: Maximal Assistance - Patient 25 - 49%     Toileting Toileting    Toileting assist Assist for toileting: Moderate Assistance - Patient 50 - 74%     Transfers Chair/bed transfer  Transfers assist     Chair/bed transfer assist level: Contact Guard/Touching assist Chair/bed transfer assistive device: Armrests,  Walker   Locomotion Ambulation   Ambulation assist      Assist level: Contact Guard/Touching assist Assistive device: Walker-rolling Max distance: 160f   Walk 10 feet activity   Assist     Assist level: Contact Guard/Touching assist Assistive device: Walker-rolling   Walk 50 feet activity   Assist    Assist level: Contact Guard/Touching assist Assistive device: Walker-rolling    Walk 150 feet activity   Assist    Assist level: Contact Guard/Touching assist Assistive device: Walker-rolling    Walk 10 feet on uneven surface  activity   Assist Walk 10 feet on uneven surfaces activity did not occur: Safety/medical concerns  (fatigue)   Assist level: Minimal Assistance - Patient > 75% Assistive device: Walker-rolling   Wheelchair     Assist Is the patient using a wheelchair?: No Type of Wheelchair: Manual    Wheelchair assist level: Dependent - Patient 0%      Wheelchair 50 feet with 2 turns activity    Assist        Assist Level: Dependent - Patient 0%   Wheelchair 150 feet activity     Assist      Assist Level: Dependent - Patient 0%   Blood pressure 130/87, pulse 79, temperature 97.6 F (36.4 C), temperature source Oral, resp. rate 17, height '5\' 11"'$  (1.803 m), weight 82.5 kg, SpO2 98 %.  Medical Problem List and Plan: 1. 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 Right visual field deficit vs neglect             -patient may shower             -ELOS/Goals: 10-14 days S           Continue CIR  2.  Antithrombotics: -DVT/anticoagulation:  Pharmaceutical: Lovenox             -antiplatelet therapy: Aspirin 81 mg daily 3. Pain Management: Tylenol as needed. Kpad ordered.  4. Depression: continue Zoloft 50 mg daily, Aricept 10 mg daily, trazodone as needed             -antipsychotic agents: Provide emotional support 5. Neuropsych/cognition: This patient is not capable of making decisions on his own behalf. 6. Skin/Wound Care: Routine skin checks 7. Fluids/Electrolytes/Nutrition: Routine in and outs with follow-up chemistries 8.  PAF.  Cardiac rate currently controlled.  Plan is to continue aspirin for now follow-up outpatient to discuss anticoagulation 9.  Hypertension.  Flomax HS ordered for urinary frequency and Norvasc d/ced, increase flomax to 0.'8mg'$ , Continue Cardura 4 mg daily, Visken 10 mg daily.  Monitor with increased mobility. Add magnesium gluconate '250mg'$  HS. Normalized, continue current regimen.  10.  Hyperlipidemia.  Continue Crestor 11.  GERD.  Continue  Protonix 12. Hypoalbuminemia: encouraged high protein diet. Improved on repeat CMP 13. Hypokalemia: received supplementation, additional 424m klor ordered 14. Urinary frequency: UA reviewed and is normal. CBC ordered and WBC is normal. Flomax ordered HS, increased to 0.'8mg'$  15. Right flank pain: kpad ordered. Discussed with family that he has history of stent.  16. AKI: encouraged 6-8 glasses of water per day, normalized. IV fluids ordered but patient refused.    14.   LOS: 9 days A FACE TO FACE EVALUATION WAS PERFORMED  Todd Golden 03/28/2022, 5:09 PM

## 2022-03-29 NOTE — Plan of Care (Signed)
  Problem: Education: Goal: Knowledge of disease or condition will improve Outcome: Completed/Met Goal: Knowledge of secondary prevention will improve (MUST DOCUMENT ALL) Outcome: Completed/Met Goal: Knowledge of patient specific risk factors will improve (Mark N/A or DELETE if not current risk factor) Outcome: Completed/Met   Problem: Ischemic Stroke/TIA Tissue Perfusion: Goal: Complications of ischemic stroke/TIA will be minimized Outcome: Completed/Met   Problem: Coping: Goal: Will verbalize positive feelings about self Outcome: Completed/Met Goal: Will identify appropriate support needs Outcome: Completed/Met   Problem: Health Behavior/Discharge Planning: Goal: Ability to manage health-related needs will improve Outcome: Completed/Met Goal: Goals will be collaboratively established with patient/family Outcome: Completed/Met   Problem: Self-Care: Goal: Ability to participate in self-care as condition permits will improve Outcome: Completed/Met Goal: Verbalization of feelings and concerns over difficulty with self-care will improve Outcome: Completed/Met Goal: Ability to communicate needs accurately will improve Outcome: Completed/Met   Problem: Nutrition: Goal: Risk of aspiration will decrease Outcome: Completed/Met Goal: Dietary intake will improve Outcome: Completed/Met   

## 2022-03-29 NOTE — Progress Notes (Signed)
Inpatient Rehabilitation Care Coordinator Discharge Note   Patient Details  Name: Todd Golden. MRN: [id number] Date of Birth: 08-18-1930   Discharge location: HOME WITH WIFE WHO CAN PROVIDE 24/7 SUPERVISION-CGA LEVEL  Length of Stay: 10 DAYS  Discharge activity level: [place]  Home/community participation: [place]  Patient response [id number] Literacy - How often do you need to have someone help you when you read instructions, pamphlets, or other written material from your doctor or pharmacy?: Never  Patient response [id number] Isolation - How often do you feel lonely or isolated from those around you?: Never  Services provided included: MD, RD, PT, OT, SLP, RN, CM, Pharmacy, Neuropsych, SW  Financial Services:  Charity fundraiser Utilized: [place] offered to/list presented to: PT AND WIFE  Follow-up services arranged:  Home Health, DME, Patient/Family has no preference for HH/DME agencies Home Health Agency: [facility]  PT  OT  SP    DME : [facility] [place] LIST TO HIRE ASSIST ALONG WITH INSURANCE COVERAGE FOR UP TO [place].  Patient response to transportation need: Is the patient able to respond to transportation needs?: Yes In the past 12 months, has lack of transportation kept you from medical appointments or from getting medications?: No In the past 12 months, has lack of transportation kept you from meetings, work, or from getting things needed for daily living?: No    Comments (or additional information):WIFE WAS IN FOR EDUCATION AND SO WAS SON. WIFE IS MAIN CAREGIVER, THEIR CHILDREN HAVE HEALTH ISSUES OF THEIR OWN. WIFE FEELS COMFORTABLE WITH PT'S CARE AT HOME  Patient/Family verbalized understanding of follow-up arrangements:  Yes  Individual responsible for coordination of  the follow-up plan: Todd Golden [phone]  Confirmed correct DME delivered: Elease Hashimoto 03/29/2022    Teyanna Thielman, [place]

## 2022-04-01 ENCOUNTER — Telehealth: Payer: Self-pay | Admitting: *Deleted

## 2022-04-01 DIAGNOSIS — J45909 Unspecified asthma, uncomplicated: Secondary | ICD-10-CM | POA: Diagnosis not present

## 2022-04-01 DIAGNOSIS — I69351 Hemiplegia and hemiparesis following cerebral infarction affecting right dominant side: Secondary | ICD-10-CM | POA: Diagnosis not present

## 2022-04-01 DIAGNOSIS — I10 Essential (primary) hypertension: Secondary | ICD-10-CM | POA: Diagnosis not present

## 2022-04-01 DIAGNOSIS — M1990 Unspecified osteoarthritis, unspecified site: Secondary | ICD-10-CM | POA: Diagnosis not present

## 2022-04-01 DIAGNOSIS — I69320 Aphasia following cerebral infarction: Secondary | ICD-10-CM | POA: Diagnosis not present

## 2022-04-01 DIAGNOSIS — I480 Paroxysmal atrial fibrillation: Secondary | ICD-10-CM | POA: Diagnosis not present

## 2022-04-01 DIAGNOSIS — Z9181 History of falling: Secondary | ICD-10-CM | POA: Diagnosis not present

## 2022-04-01 DIAGNOSIS — H3530 Unspecified macular degeneration: Secondary | ICD-10-CM | POA: Diagnosis not present

## 2022-04-01 DIAGNOSIS — E785 Hyperlipidemia, unspecified: Secondary | ICD-10-CM | POA: Diagnosis not present

## 2022-04-01 DIAGNOSIS — K5909 Other constipation: Secondary | ICD-10-CM | POA: Diagnosis not present

## 2022-04-01 NOTE — Telephone Encounter (Signed)
FYI patient's spouse calling to inform that the patient does not wish to return for any future visits. He was seen @ [facility] and wants to been seen by his pcp Dr. Derrel Nip.

## 2022-04-02 DIAGNOSIS — J3089 Other allergic rhinitis: Secondary | ICD-10-CM | POA: Diagnosis not present

## 2022-04-02 DIAGNOSIS — J3081 Allergic rhinitis due to animal (cat) (dog) hair and dander: Secondary | ICD-10-CM | POA: Diagnosis not present

## 2022-04-02 DIAGNOSIS — J301 Allergic rhinitis due to pollen: Secondary | ICD-10-CM | POA: Diagnosis not present

## 2022-04-03 ENCOUNTER — Ambulatory Visit (INDEPENDENT_AMBULATORY_CARE_PROVIDER_SITE_OTHER): Payer: PPO | Admitting: Internal Medicine

## 2022-04-03 ENCOUNTER — Encounter: Payer: Self-pay | Admitting: Internal Medicine

## 2022-04-03 VITALS — BP 142/62 | HR 92 | Temp 97.8°F | Ht 71.0 in | Wt 185.6 lb

## 2022-04-03 DIAGNOSIS — R1310 Dysphagia, unspecified: Secondary | ICD-10-CM

## 2022-04-03 DIAGNOSIS — D6869 Other thrombophilia: Secondary | ICD-10-CM

## 2022-04-03 DIAGNOSIS — R29898 Other symptoms and signs involving the musculoskeletal system: Secondary | ICD-10-CM | POA: Diagnosis not present

## 2022-04-03 DIAGNOSIS — F0153 Vascular dementia, unspecified severity, with mood disturbance: Secondary | ICD-10-CM | POA: Diagnosis not present

## 2022-04-03 DIAGNOSIS — I5020 Unspecified systolic (congestive) heart failure: Secondary | ICD-10-CM

## 2022-04-03 DIAGNOSIS — I480 Paroxysmal atrial fibrillation: Secondary | ICD-10-CM | POA: Diagnosis not present

## 2022-04-03 DIAGNOSIS — I6930 Unspecified sequelae of cerebral infarction: Secondary | ICD-10-CM

## 2022-04-03 DIAGNOSIS — Z09 Encounter for follow-up examination after completed treatment for conditions other than malignant neoplasm: Secondary | ICD-10-CM

## 2022-04-03 NOTE — Assessment & Plan Note (Addendum)
DHe was discharged from rehab on March 29 2022  t home following Left MCA territory , left frontal lobe infarct.  With improving RUE/RLW weakness.  Dysarthria slightly worse.  Ambulating with a walker.  ST, OT PT  to start today

## 2022-04-03 NOTE — Patient Instructions (Addendum)
Your Medicare Annual Wellness visit is due in May 2024. Please schedule this appointment at checkout    You have made a remarkable recovery !    Ask your pharmacist to send all refill requests to me  Let me know if you start to feel that your mood needs a boost   See you in 6 weeks

## 2022-04-03 NOTE — Progress Notes (Signed)
Subjective:  Patient ID: Todd Golden., male    DOB: 08/18/1930  Age: 89 y.o. MRN: [id number]  CC: The primary encounter diagnosis was History of CVA with residual deficit. Diagnoses of Systolic heart failure, unspecified HF chronicity ([place]), Swallowing dysfunction, Paroxysmal atrial fibrillation ([place]), 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[place]), Acquired thrombophilia ([place]), Right leg weakness, and Hospital discharge follow-up were also pertinent to this visit.   HPI Todd Golden. 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  He was admitted on February 22 with right sided weakness and determined by MRI /CT to have had another left MCA stroke involving the left frontal lobe.  He was discharged to rehab on Feb 27 and and was discharged home from rehab on March 8    He is accompanied by his wife today who provides all of the history.    He has regained a considerable amount of right sided coordination and strength.  He is tolerating a regular diet.  His mood is fair,  she notes that he gets frustrated with being unable to express his thoughts,  but he denies worsening of depression today .   Outpatient Medications Prior to Visit  Medication Sig Dispense Refill   acetaminophen (TYLENOL) 325 MG tablet Take 2 tablets (650 mg total) by mouth every 6 (six) hours as needed for mild pain (or Fever >/= 101).     aspirin EC 81 MG tablet Take 81 mg by mouth daily. Swallow whole.     cholecalciferol (VITAMIN D3) 25 MCG (1000 UNIT) tablet Take 1 tablet (1,000 Units total) by mouth daily. 30 tablet 0   donepezil (ARICEPT) 10 MG tablet Take 1 tablet (10 mg total) by mouth daily. 30 tablet 0   doxazosin (CARDURA) 4 MG tablet Take 1  tablet (4 mg total) by mouth daily. 90 tablet 0   l-methylfolate-B6-B12 (METANX) 3-35-2 MG TABS tablet Take 1 tablet by mouth daily. 30 tablet 0   latanoprost (XALATAN) 0.005 % ophthalmic solution Place 1 drop into both eyes at bedtime.     magnesium gluconate (MAGONATE) 500 MG tablet Take 0.5 tablets (250 mg total) by mouth at bedtime. 30 tablet 0   Multiple Vitamins-Minerals (PRESERVISION AREDS 2 PO) Take by mouth daily.     omeprazole (PRILOSEC) 40 MG capsule Take by mouth 1 capsule daily 90 capsule 3   pindolol (VISKEN) 10 MG tablet Take by mouth 1 capsule daily 90 tablet 1   rosuvastatin (CRESTOR) 20 MG tablet Take 1 tablet (20 mg total) by mouth daily. 30 tablet 0   sertraline (ZOLOFT) 50 MG tablet Take 1 tablet (50 mg total) by mouth daily. 30 tablet 0   tamsulosin (FLOMAX) 0.4 MG CAPS capsule Take 2 capsules (0.8 mg total) by mouth daily after supper. 30 capsule 0   No facility-administered medications prior to visit.    Review of Systems;  Patient denies headache, fevers, malaise, unintentional weight loss, skin rash, eye pain, sinus congestion and sinus pain, sore throat, dysphagia,  hemoptysis , cough, dyspnea, wheezing, chest pain, palpitations, orthopnea, edema, abdominal pain, nausea, melena, diarrhea, constipation, flank pain, dysuria, hematuria, urinary  Frequency, nocturia, numbness, tingling, seizures,  Focal weakness, Loss of consciousness,  Tremor, insomnia,  depression, anxiety, and suicidal ideation.      Objective:  BP (!) 142/62   Pulse 92   Temp 97.8 F (36.6 C) (Oral)   Ht 5\' 11"  (1.803 m)   Wt 185 lb 9.6 oz (84.2 kg)   SpO2 98%   BMI 25.89 kg/m   BP Readings from Last 3 Encounters:  04/03/22 (!) 142/62  03/29/22 115/67  03/19/22 99/64    Wt Readings from Last 3 Encounters:  04/03/22 185 lb 9.6 oz (84.2 kg)  03/24/22 181 lb 14.1 oz (82.5 kg)  03/16/22 174 lb 13.2 oz (79.3 kg)    Physical Exam  Lab Results  Component Value Date   HGBA1C 5.6  03/16/2022   HGBA1C 6.2 11/02/2021   HGBA1C 6.3 08/02/2020    Lab Results  Component Value Date   CREATININE 1.21 03/27/2022   CREATININE 1.22 03/26/2022   CREATININE 1.54 (H) 03/25/2022    Lab Results  Component Value Date   WBC 7.0 03/25/2022   HGB 13.5 03/25/2022   HCT 41.1 03/25/2022   PLT 254 03/25/2022   GLUCOSE 104 (H) 03/27/2022   CHOL 165 03/16/2022   TRIG 109 03/16/2022   HDL 50 03/16/2022   LDLCALC 93 03/16/2022   ALT 29 03/25/2022   AST 35 03/25/2022   NA 139 03/27/2022   K 3.6 03/27/2022   CL 104 03/27/2022   CREATININE 1.21 03/27/2022   BUN 17 03/27/2022   CO2 25 03/27/2022   TSH 1.54 11/02/2021   PSA 3.32 11/02/2021   INR 1.1 03/15/2022   HGBA1C 5.6 03/16/2022    No results found.  Assessment & Plan:  .History of CVA with residual deficit Assessment & Plan: DHe was discharged from rehab on March 29 2022  t home following Left MCA territory , left frontal lobe infarct.  With improving RUE/RLW weakness.  Dysarthria slightly worse.  Ambulating with a walker.  ST, OT PT  to start today    Systolic heart failure, unspecified HF chronicity ([place]) Assessment & Plan: He has no signs of decompensation on exam   Swallowing dysfunction  Paroxysmal atrial fibrillation (HCC) Assessment & Plan: - We will continue his beta-blocker therapy and aspirin. - The patient is not a candidate for anticoagulation at this time due to fall risk.   Arteriosclerotic dementia with depression [facility]) Assessment & Plan: -We will continue his Aricept and Zoloft.   Acquired thrombophilia ([place]) Assessment & Plan: Secondary to atrial fib, previously mitigated with Eliquis for embolic stroke risk mitigation; however,  Eliquis has been stopped due to frequent falls and he has had a second CVA (left MCA  Feb 2024)    Right leg weakness Assessment & Plan: Improving with continued PT/OT.  Using a cane/    Hospital discharge follow-up Assessment & Plan: Patient is stable  post discharge from rehab which he entered on Feb 27 and was left o n March 8 following an  acute CLeft MCA VA with right sided weakness,  .  He has no new issues or questions about discharge plans at the visit today for hospital follow up. All labs , imaging studies and progress notes from admission were reviewed with patient today         I provided 30 minutes of face-to-face time during this encounter reviewing patient's last visit with me, patient's  most recent visit with cardiology,  nephrology,  and neurology,  recent surgical and non surgical procedures, previous  labs and imaging studies, counseling on currently addressed issues,  and post visit ordering to diagnostics and therapeutics .   Follow-up: No follow-ups on file.   Crecencio Mc, MD

## 2022-04-05 DIAGNOSIS — K5909 Other constipation: Secondary | ICD-10-CM

## 2022-04-05 DIAGNOSIS — Z7982 Long term (current) use of aspirin: Secondary | ICD-10-CM

## 2022-04-05 DIAGNOSIS — H3530 Unspecified macular degeneration: Secondary | ICD-10-CM

## 2022-04-05 DIAGNOSIS — Z9181 History of falling: Secondary | ICD-10-CM

## 2022-04-05 DIAGNOSIS — E785 Hyperlipidemia, unspecified: Secondary | ICD-10-CM

## 2022-04-05 DIAGNOSIS — M1990 Unspecified osteoarthritis, unspecified site: Secondary | ICD-10-CM

## 2022-04-05 DIAGNOSIS — I69351 Hemiplegia and hemiparesis following cerebral infarction affecting right dominant side: Secondary | ICD-10-CM

## 2022-04-05 DIAGNOSIS — J45909 Unspecified asthma, uncomplicated: Secondary | ICD-10-CM

## 2022-04-05 DIAGNOSIS — H409 Unspecified glaucoma: Secondary | ICD-10-CM

## 2022-04-05 DIAGNOSIS — I10 Essential (primary) hypertension: Secondary | ICD-10-CM

## 2022-04-05 DIAGNOSIS — I480 Paroxysmal atrial fibrillation: Secondary | ICD-10-CM

## 2022-04-05 DIAGNOSIS — K219 Gastro-esophageal reflux disease without esophagitis: Secondary | ICD-10-CM

## 2022-04-05 DIAGNOSIS — R4701 Aphasia: Secondary | ICD-10-CM

## 2022-04-05 DIAGNOSIS — I69320 Aphasia following cerebral infarction: Secondary | ICD-10-CM

## 2022-04-06 DIAGNOSIS — Z09 Encounter for follow-up examination after completed treatment for conditions other than malignant neoplasm: Secondary | ICD-10-CM | POA: Insufficient documentation

## 2022-04-06 DIAGNOSIS — I5020 Unspecified systolic (congestive) heart failure: Secondary | ICD-10-CM | POA: Insufficient documentation

## 2022-04-06 NOTE — Assessment & Plan Note (Deleted)
He has no signs of decompensated heart failure on exam.

## 2022-04-06 NOTE — Assessment & Plan Note (Signed)
-   We will continue his beta-blocker therapy and aspirin. - The patient is not a candidate for anticoagulation at this time due to fall risk. 

## 2022-04-06 NOTE — Assessment & Plan Note (Signed)
He has no signs of decompensation on exam

## 2022-04-06 NOTE — Assessment & Plan Note (Signed)
Secondary to atrial fib, previously mitigated with Eliquis for embolic stroke risk mitigation; however,  Eliquis has been stopped due to frequent falls and he has had a second CVA (left MCA  Feb 2024)

## 2022-04-06 NOTE — Assessment & Plan Note (Addendum)
Patient is stable post discharge from rehab which he entered on Feb 27 and was left o n March 8 following an  acute CLeft MCA VA with right sided weakness,  .  He has no new issues or questions about discharge plans at the visit today for hospital follow up. All labs , imaging studies and progress notes from admission were reviewed with patient today

## 2022-04-06 NOTE — Assessment & Plan Note (Signed)
-   We will continue his Aricept and Zoloft. 

## 2022-04-06 NOTE — Assessment & Plan Note (Signed)
Improving with continued PT/OT.  Using a cane/

## 2022-04-09 ENCOUNTER — Encounter: Payer: PPO | Admitting: Physical Medicine and Rehabilitation

## 2022-04-09 DIAGNOSIS — J301 Allergic rhinitis due to pollen: Secondary | ICD-10-CM | POA: Diagnosis not present

## 2022-04-09 DIAGNOSIS — J3089 Other allergic rhinitis: Secondary | ICD-10-CM | POA: Diagnosis not present

## 2022-04-09 DIAGNOSIS — J3081 Allergic rhinitis due to animal (cat) (dog) hair and dander: Secondary | ICD-10-CM | POA: Diagnosis not present

## 2022-04-16 DIAGNOSIS — J301 Allergic rhinitis due to pollen: Secondary | ICD-10-CM | POA: Diagnosis not present

## 2022-04-16 DIAGNOSIS — J3089 Other allergic rhinitis: Secondary | ICD-10-CM | POA: Diagnosis not present

## 2022-04-16 DIAGNOSIS — J3081 Allergic rhinitis due to animal (cat) (dog) hair and dander: Secondary | ICD-10-CM | POA: Diagnosis not present

## 2022-04-20 ENCOUNTER — Other Ambulatory Visit: Payer: Self-pay | Admitting: Internal Medicine

## 2022-04-22 DIAGNOSIS — H3530 Unspecified macular degeneration: Secondary | ICD-10-CM | POA: Diagnosis not present

## 2022-04-22 DIAGNOSIS — Z9181 History of falling: Secondary | ICD-10-CM | POA: Diagnosis not present

## 2022-04-22 DIAGNOSIS — I69351 Hemiplegia and hemiparesis following cerebral infarction affecting right dominant side: Secondary | ICD-10-CM | POA: Diagnosis not present

## 2022-04-22 DIAGNOSIS — E785 Hyperlipidemia, unspecified: Secondary | ICD-10-CM | POA: Diagnosis not present

## 2022-04-22 DIAGNOSIS — I10 Essential (primary) hypertension: Secondary | ICD-10-CM | POA: Diagnosis not present

## 2022-04-22 DIAGNOSIS — I480 Paroxysmal atrial fibrillation: Secondary | ICD-10-CM | POA: Diagnosis not present

## 2022-04-22 DIAGNOSIS — M1990 Unspecified osteoarthritis, unspecified site: Secondary | ICD-10-CM | POA: Diagnosis not present

## 2022-04-22 DIAGNOSIS — I69320 Aphasia following cerebral infarction: Secondary | ICD-10-CM | POA: Diagnosis not present

## 2022-04-22 DIAGNOSIS — K5909 Other constipation: Secondary | ICD-10-CM | POA: Diagnosis not present

## 2022-04-22 DIAGNOSIS — J45909 Unspecified asthma, uncomplicated: Secondary | ICD-10-CM | POA: Diagnosis not present

## 2022-04-23 DIAGNOSIS — J301 Allergic rhinitis due to pollen: Secondary | ICD-10-CM | POA: Diagnosis not present

## 2022-04-23 DIAGNOSIS — J3089 Other allergic rhinitis: Secondary | ICD-10-CM | POA: Diagnosis not present

## 2022-04-23 DIAGNOSIS — J3081 Allergic rhinitis due to animal (cat) (dog) hair and dander: Secondary | ICD-10-CM | POA: Diagnosis not present

## 2022-04-26 DIAGNOSIS — H353211 Exudative age-related macular degeneration, right eye, with active choroidal neovascularization: Secondary | ICD-10-CM | POA: Diagnosis not present

## 2022-04-27 ENCOUNTER — Other Ambulatory Visit: Payer: Self-pay | Admitting: Internal Medicine

## 2022-04-30 ENCOUNTER — Inpatient Hospital Stay
Admission: EM | Admit: 2022-04-30 | Discharge: 2022-05-03 | DRG: 308 | Disposition: A | Discharge status: Home / Self Care | Payer: PPO | Attending: Family Medicine | Admitting: Family Medicine

## 2022-04-30 ENCOUNTER — Emergency Department: Payer: PPO

## 2022-04-30 ENCOUNTER — Encounter: Payer: Self-pay | Admitting: Internal Medicine

## 2022-04-30 ENCOUNTER — Other Ambulatory Visit: Payer: Self-pay

## 2022-04-30 DIAGNOSIS — Z823 Family history of stroke: Secondary | ICD-10-CM

## 2022-04-30 DIAGNOSIS — I5023 Acute on chronic systolic (congestive) heart failure: Secondary | ICD-10-CM | POA: Insufficient documentation

## 2022-04-30 DIAGNOSIS — R079 Chest pain, unspecified: Secondary | ICD-10-CM | POA: Diagnosis not present

## 2022-04-30 DIAGNOSIS — I25118 Atherosclerotic heart disease of native coronary artery with other forms of angina pectoris: Secondary | ICD-10-CM | POA: Diagnosis not present

## 2022-04-30 DIAGNOSIS — J810 Acute pulmonary edema: Secondary | ICD-10-CM | POA: Diagnosis not present

## 2022-04-30 DIAGNOSIS — I110 Hypertensive heart disease with heart failure: Secondary | ICD-10-CM | POA: Diagnosis not present

## 2022-04-30 DIAGNOSIS — I69393 Ataxia following cerebral infarction: Secondary | ICD-10-CM

## 2022-04-30 DIAGNOSIS — I69391 Dysphagia following cerebral infarction: Secondary | ICD-10-CM

## 2022-04-30 DIAGNOSIS — Z66 Do not resuscitate: Secondary | ICD-10-CM | POA: Diagnosis not present

## 2022-04-30 DIAGNOSIS — I482 Chronic atrial fibrillation, unspecified: Secondary | ICD-10-CM | POA: Diagnosis not present

## 2022-04-30 DIAGNOSIS — H3530 Unspecified macular degeneration: Secondary | ICD-10-CM | POA: Diagnosis present

## 2022-04-30 DIAGNOSIS — Z79899 Other long term (current) drug therapy: Secondary | ICD-10-CM

## 2022-04-30 DIAGNOSIS — I4821 Permanent atrial fibrillation: Principal | ICD-10-CM | POA: Diagnosis present

## 2022-04-30 DIAGNOSIS — F03911 Unspecified dementia, unspecified severity, with agitation: Secondary | ICD-10-CM | POA: Diagnosis not present

## 2022-04-30 DIAGNOSIS — I429 Cardiomyopathy, unspecified: Secondary | ICD-10-CM | POA: Diagnosis not present

## 2022-04-30 DIAGNOSIS — Z7982 Long term (current) use of aspirin: Secondary | ICD-10-CM

## 2022-04-30 DIAGNOSIS — K219 Gastro-esophageal reflux disease without esophagitis: Secondary | ICD-10-CM | POA: Diagnosis not present

## 2022-04-30 DIAGNOSIS — I6930 Unspecified sequelae of cerebral infarction: Secondary | ICD-10-CM | POA: Diagnosis not present

## 2022-04-30 DIAGNOSIS — Z8051 Family history of malignant neoplasm of kidney: Secondary | ICD-10-CM

## 2022-04-30 DIAGNOSIS — I10 Essential (primary) hypertension: Secondary | ICD-10-CM | POA: Diagnosis not present

## 2022-04-30 DIAGNOSIS — I69322 Dysarthria following cerebral infarction: Secondary | ICD-10-CM

## 2022-04-30 DIAGNOSIS — E876 Hypokalemia: Secondary | ICD-10-CM | POA: Diagnosis not present

## 2022-04-30 DIAGNOSIS — Z96652 Presence of left artificial knee joint: Secondary | ICD-10-CM | POA: Diagnosis not present

## 2022-04-30 DIAGNOSIS — H9190 Unspecified hearing loss, unspecified ear: Secondary | ICD-10-CM | POA: Diagnosis not present

## 2022-04-30 DIAGNOSIS — M1990 Unspecified osteoarthritis, unspecified site: Secondary | ICD-10-CM | POA: Diagnosis not present

## 2022-04-30 DIAGNOSIS — E785 Hyperlipidemia, unspecified: Secondary | ICD-10-CM | POA: Diagnosis present

## 2022-04-30 DIAGNOSIS — N400 Enlarged prostate without lower urinary tract symptoms: Secondary | ICD-10-CM | POA: Diagnosis not present

## 2022-04-30 DIAGNOSIS — R0602 Shortness of breath: Secondary | ICD-10-CM | POA: Diagnosis not present

## 2022-04-30 DIAGNOSIS — J45909 Unspecified asthma, uncomplicated: Secondary | ICD-10-CM | POA: Diagnosis present

## 2022-04-30 DIAGNOSIS — Z8261 Family history of arthritis: Secondary | ICD-10-CM

## 2022-04-30 DIAGNOSIS — I214 Non-ST elevation (NSTEMI) myocardial infarction: Secondary | ICD-10-CM

## 2022-04-30 DIAGNOSIS — H409 Unspecified glaucoma: Secondary | ICD-10-CM | POA: Diagnosis present

## 2022-04-30 DIAGNOSIS — I5043 Acute on chronic combined systolic (congestive) and diastolic (congestive) heart failure: Secondary | ICD-10-CM | POA: Diagnosis present

## 2022-04-30 DIAGNOSIS — I2510 Atherosclerotic heart disease of native coronary artery without angina pectoris: Secondary | ICD-10-CM | POA: Diagnosis not present

## 2022-04-30 DIAGNOSIS — J90 Pleural effusion, not elsewhere classified: Secondary | ICD-10-CM | POA: Diagnosis not present

## 2022-04-30 DIAGNOSIS — I4891 Unspecified atrial fibrillation: Secondary | ICD-10-CM

## 2022-04-30 DIAGNOSIS — R296 Repeated falls: Secondary | ICD-10-CM | POA: Diagnosis present

## 2022-04-30 HISTORY — DX: Chronic atrial fibrillation, unspecified: I48.20

## 2022-04-30 LAB — BASIC METABOLIC PANEL
Anion gap: 8 (ref 5–15)
BUN: 17 mg/dL (ref 8–23)
CO2: 24 mmol/L (ref 22–32)
Calcium: 8.5 mg/dL — ABNORMAL LOW (ref 8.9–10.3)
Chloride: 108 mmol/L (ref 98–111)
Creatinine, Ser: 1.11 mg/dL (ref 0.61–1.24)
GFR, Estimated: >60 mL/min (ref >60)
Glucose, Bld: 121 mg/dL — ABNORMAL HIGH (ref 70–99)
Potassium: 4 mmol/L (ref 3.5–5.1)
Sodium: 140 mmol/L (ref 135–145)

## 2022-04-30 LAB — CBC
HCT: 39.8 % (ref 39.0–52.0)
Hemoglobin: 12.8 g/dL — ABNORMAL LOW (ref 13.0–17.0)
MCH: 30.1 pg (ref 26.0–34.0)
MCHC: 32.2 g/dL (ref 30.0–36.0)
MCV: 93.6 fL (ref 80.0–100.0)
Platelets: 231 10*3/uL (ref 150–400)
RBC: 4.25 MIL/uL (ref 4.22–5.81)
RDW: 13.6 % (ref 11.5–15.5)
WBC: 6.6 10*3/uL (ref 4.0–10.5)
nRBC: 0 % (ref 0.0–0.2)

## 2022-04-30 LAB — TROPONIN I (HIGH SENSITIVITY): Troponin I (High Sensitivity): 17 ng/L (ref <18)

## 2022-04-30 LAB — BRAIN NATRIURETIC PEPTIDE: B Natriuretic Peptide: 588.3 pg/mL — ABNORMAL HIGH (ref 0.0–100.0)

## 2022-04-30 MED ORDER — DILTIAZEM LOAD VIA INFUSION: 10.0000 mg | Freq: Once | INTRAVENOUS | Status: DC

## 2022-04-30 MED ORDER — ENOXAPARIN SODIUM 40 MG/0.4ML IJ SOSY
40.0000 mg | PREFILLED_SYRINGE | INTRAMUSCULAR | Status: DC
  Administered 2022-04-30 – 2022-05-03 (×4): 40 mg via SUBCUTANEOUS
  Filled 2022-04-30 (×4): qty 0.4

## 2022-04-30 MED ORDER — TAMSULOSIN HCL 0.4 MG PO CAPS
0.4000 mg | ORAL_CAPSULE | Freq: Every day | ORAL | Status: DC
  Administered 2022-04-30 – 2022-05-03 (×4): 0.4 mg via ORAL
  Filled 2022-04-30 (×4): qty 1

## 2022-04-30 MED ORDER — ACETAMINOPHEN 325 MG PO TABS
650.0000 mg | ORAL_TABLET | ORAL | Status: DC | PRN
  Administered 2022-04-30 – 2022-05-01 (×2): 650 mg via ORAL

## 2022-04-30 MED ORDER — DILTIAZEM LOAD VIA INFUSION
10.0000 mg | Freq: Once | INTRAVENOUS | Status: DC
  Filled 2022-04-30: qty 10

## 2022-04-30 MED ORDER — ASPIRIN 81 MG PO TBEC
81.0000 mg | DELAYED_RELEASE_TABLET | Freq: Every day | ORAL | Status: DC
  Administered 2022-04-30 – 2022-05-03 (×4): 81 mg via ORAL
  Filled 2022-04-30 (×4): qty 1

## 2022-04-30 MED ORDER — ROSUVASTATIN CALCIUM 10 MG PO TABS
10.0000 mg | ORAL_TABLET | Freq: Every day | ORAL | Status: DC
  Administered 2022-04-30 – 2022-05-03 (×4): 10 mg via ORAL
  Filled 2022-04-30 (×4): qty 1

## 2022-04-30 MED ORDER — TORSEMIDE 20 MG PO TABS
20.0000 mg | ORAL_TABLET | Freq: Every day | ORAL | Status: DC
  Administered 2022-05-01 – 2022-05-03 (×3): 20 mg via ORAL
  Filled 2022-04-30 (×3): qty 1

## 2022-04-30 MED ORDER — LATANOPROST 0.005 % OP SOLN
1.0000 [drp] | Freq: Every day | OPHTHALMIC | Status: DC
  Administered 2022-05-01 – 2022-05-02 (×2): 1 [drp] via OPHTHALMIC
  Filled 2022-04-30: qty 2.5

## 2022-04-30 MED ORDER — DOXAZOSIN MESYLATE 4 MG PO TABS
4.0000 mg | ORAL_TABLET | Freq: Every day | ORAL | Status: DC
  Administered 2022-04-30 – 2022-05-01 (×2): 4 mg via ORAL
  Filled 2022-04-30 (×2): qty 1

## 2022-04-30 MED ORDER — FUROSEMIDE 10 MG/ML IJ SOLN
40.0000 mg | Freq: Once | INTRAMUSCULAR | Status: AC
  Administered 2022-04-30: 40 mg via INTRAVENOUS
  Filled 2022-04-30: qty 4

## 2022-04-30 MED ORDER — METOPROLOL SUCCINATE ER 50 MG PO TB24
50.0000 mg | ORAL_TABLET | Freq: Every day | ORAL | Status: DC
  Administered 2022-04-30 – 2022-05-01 (×2): 50 mg via ORAL
  Filled 2022-04-30 (×2): qty 1

## 2022-04-30 MED ORDER — DILTIAZEM HCL-DEXTROSE 125-5 MG/125ML-% IV SOLN (PREMIX)
5.0000 mg/h | INTRAVENOUS | Status: DC
  Administered 2022-04-30: 10 mg/h via INTRAVENOUS
  Administered 2022-04-30: 5 mg/h via INTRAVENOUS
  Filled 2022-04-30 (×2): qty 125

## 2022-04-30 MED ORDER — SODIUM CHLORIDE 0.9% FLUSH
3.0000 mL | Freq: Two times a day (BID) | INTRAVENOUS | Status: DC
  Administered 2022-04-30 – 2022-05-03 (×7): 3 mL via INTRAVENOUS

## 2022-04-30 MED ORDER — SODIUM CHLORIDE 0.9% FLUSH: 3.0000 mL | INTRAVENOUS | Status: DC | PRN

## 2022-04-30 MED ORDER — SERTRALINE HCL 50 MG PO TABS
50.0000 mg | ORAL_TABLET | Freq: Every day | ORAL | Status: DC
  Administered 2022-04-30 – 2022-05-03 (×4): 50 mg via ORAL
  Filled 2022-04-30 (×4): qty 1

## 2022-04-30 MED ORDER — SODIUM CHLORIDE 0.9 % IV SOLN: 250.0000 mL | INTRAVENOUS | Status: DC | PRN

## 2022-04-30 MED ORDER — PANTOPRAZOLE SODIUM 40 MG PO TBEC
40.0000 mg | DELAYED_RELEASE_TABLET | Freq: Every day | ORAL | Status: DC
  Administered 2022-04-30 – 2022-05-03 (×4): 40 mg via ORAL
  Filled 2022-04-30 (×4): qty 1

## 2022-04-30 MED ORDER — DILTIAZEM HCL-DEXTROSE 125-5 MG/125ML-% IV SOLN (PREMIX): 5.0000 mg/h | INTRAVENOUS | Status: DC

## 2022-04-30 MED ORDER — ONDANSETRON HCL 4 MG/2ML IJ SOLN: 4.0000 mg | Freq: Four times a day (QID) | INTRAMUSCULAR | Status: DC | PRN

## 2022-04-30 MED ORDER — MAGNESIUM HYDROXIDE 400 MG/5ML PO SUSP
15.0000 mL | Freq: Every day | ORAL | Status: DC | PRN
  Administered 2022-04-30: 15 mL via ORAL
  Filled 2022-04-30: qty 30

## 2022-04-30 MED ORDER — DILTIAZEM LOAD VIA INFUSION
10.0000 mg | Freq: Once | INTRAVENOUS | Status: AC
  Administered 2022-04-30: 10 mg via INTRAVENOUS
  Filled 2022-04-30: qty 10

## 2022-04-30 MED ORDER — ACETAMINOPHEN 325 MG PO TABS
650.0000 mg | ORAL_TABLET | Freq: Four times a day (QID) | ORAL | Status: DC | PRN
  Filled 2022-04-30 (×2): qty 2

## 2022-04-30 MED ORDER — DONEPEZIL HCL 5 MG PO TABS
10.0000 mg | ORAL_TABLET | Freq: Every day | ORAL | Status: DC
  Administered 2022-04-30 – 2022-05-03 (×4): 10 mg via ORAL
  Filled 2022-04-30 (×4): qty 2

## 2022-04-30 NOTE — ED Provider Notes (Signed)
[facility] Provider Note    Event Date/Time   First MD Initiated Contact with Patient 04/30/22 1004     (approximate)   History   Shortness of Breath   HPI  Todd Golden. is a 89 y.o. male history of paroxysmal A-fib recent CVA not on anticoagulation due to fall risk presents to the ER for evaluation of shortness of breath and chest discomfort starting early last night.  Denies any pain with deep inspiration.  No nausea or vomiting.  No numbness or tingling.  Just feels winded and fatigued.  Found to be in A-fib with RVR.  Did not take his morning medications.     Physical Exam   Triage Vital Signs: ED Triage Vitals  Enc Vitals Group     BP 04/30/22 0955 (!) 123/104     Pulse Rate 04/30/22 0955 (!) 130     Resp 04/30/22 0955 18     Temp 04/30/22 0955 98.2 F (36.8 C)     Temp Source 04/30/22 0955 Oral     SpO2 04/30/22 0955 94 %     Weight 04/30/22 0956 185 lb (83.9 kg)     Height 04/30/22 0956 5\' 11"  (1.803 m)     Head Circumference --      Peak Flow --      Pain Score 04/30/22 0956 0     Pain Loc --      Pain Edu? --      Excl. in GC? --     Most recent vital signs: Vitals:   04/30/22 1100 04/30/22 1115  BP: 133/84 132/86  Pulse: (!) 59 (!) 48  Resp: (!) 26 (!) 23  Temp:    SpO2: 94% 91%     Constitutional: Alert  Eyes: Conjunctivae are normal.  Head: Atraumatic. Nose: No congestion/rhinnorhea. Mouth/Throat: Mucous membranes are moist.   Neck: Painless ROM.  Cardiovascular:   Good peripheral circulation.irregularly irregular tachycardic Respiratory: Normal respiratory effort.  No retractions.  Gastrointestinal: Soft and nontender.  Musculoskeletal:  no deformity Neurologic:  MAE spontaneously. No gross focal neurologic deficits are appreciated.  Skin:  Skin is warm, dry and intact. No rash noted. Psychiatric: Mood and affect are normal. Speech and behavior are normal.    ED Results / Procedures / Treatments    Labs (all labs ordered are listed, but only abnormal results are displayed) Labs Reviewed  BASIC METABOLIC PANEL - Abnormal; Notable for the following components:      Result Value   Glucose, Bld 121 (*)    Calcium 8.5 (*)    All other components within normal limits  CBC - Abnormal; Notable for the following components:   Hemoglobin 12.8 (*)    All other components within normal limits  BRAIN NATRIURETIC PEPTIDE  TROPONIN I (HIGH SENSITIVITY)     EKG  ED ECG REPORT I, Willy Eddy, the attending physician, personally viewed and interpreted this ECG.   Date: 04/30/2022  EKG Time: 9:43  Rate: 130  Rhythm: afib  Axis: normal  Intervals: borderline prolonged qt  ST&T Change: nonspecific st abn    RADIOLOGY Please see ED Course for my review and interpretation.  I personally reviewed all radiographic images ordered to evaluate for the above acute complaints and reviewed radiology reports and findings.  These findings were personally discussed with the patient.  Please see medical record for radiology report.    PROCEDURES:  Critical Care performed: Yes, see critical care procedure note(s)  .Critical  Care  Performed by: Willy Eddy, MD Authorized by: Willy Eddy, MD   Critical care provider statement:    Critical care time (minutes):  35   Critical care was necessary to treat or prevent imminent or life-threatening deterioration of the following conditions:  Circulatory failure and cardiac failure   Critical care was time spent personally by me on the following activities:  Ordering and performing treatments and interventions, ordering and review of laboratory studies, ordering and review of radiographic studies, pulse oximetry, re-evaluation of patient's condition, review of old charts, obtaining history from patient or surrogate, examination of patient, evaluation of patient's response to treatment, discussions with primary provider, discussions with  consultants and development of treatment plan with patient or surrogate    MEDICATIONS ORDERED IN ED: Medications  diltiazem (CARDIZEM) 1 mg/mL load via infusion 10 mg (10 mg Intravenous Bolus from Bag 04/30/22 1035)    And  diltiazem (CARDIZEM) 125 mg in dextrose 5% 125 mL (1 mg/mL) infusion (10 mg/hr Intravenous Rate/Dose Change 04/30/22 1122)  furosemide (LASIX) injection 40 mg (40 mg Intravenous Given 04/30/22 1132)     IMPRESSION / MDM / ASSESSMENT AND PLAN / ED COURSE  I reviewed the triage vital signs and the nursing notes.                              Differential diagnosis includes, but is not limited to, dysrhythmia, ACS, CHF, pneumonia, sepsis, anemia  Patient presenting to the ER for evaluation of symptoms as described above.  Based on symptoms, risk factors and considered above differential, this presenting complaint could reflect a potentially life-threatening illness therefore the patient will be placed on continuous pulse oximetry and telemetry for monitoring.  Laboratory evaluation will be sent to evaluate for the above complaints.      Clinical Course as of 04/30/22 1133  Tue Apr 30, 2022  1058 Heart rate on cardiac monitor currently in the 90s after bolus of Cardizem.  Normotensive. [PR]  1059 Chest x-ray my review and interpretation with possible mild pulmonary edema will await formal radiology report. [PR]  1124 Will give IV Lasix for pulmonary edema. [PR]  1124 Will consult hospitalist for admission. [PR]    Clinical Course User Index [PR] Willy Eddy, MD     FINAL CLINICAL IMPRESSION(S) / ED DIAGNOSES   Final diagnoses:  Atrial fibrillation with RVR  Acute pulmonary edema     Rx / DC Orders   ED Discharge Orders     None        Note:  This document was prepared using Dragon voice recognition software and may include unintentional dictation errors.    Willy Eddy, MD 04/30/22 [phone]

## 2022-04-30 NOTE — H&P (Signed)
History and Physical    Patient: Todd Golden. [id number] DOB: [birth year] DOA: 04/30/2022 DOS: the patient was seen and examined on 04/30/2022 PCP: Sherlene Shams, MD  Patient coming from: Home, to walk with a walker.  Chief Complaint:  Chief Complaint  Patient presents with   Shortness of Breath   HPI: Todd Golden. is a 89 y.o. male with medical history significant of chronic atrial fibrillation, not on anticoagulation due to frequent falls, history of stroke with residual dysarthria, dysphagia, chronic systolic congestive heart failure with ejection fraction 40 to 45% on recent echocardiogram, benign prostate hypertrophy, essential hypertension, dyslipidemia, who presents to the hospital with complaints of shortness of breath. Patient woke up this morning, has been complaining short of breath.  Minimal cough.  He has some dysphagia, but never had any aspiration pneumonia in the past. Upon arriving the hospital, chest x-ray showed vascular congestion, BNP 588.  Giving a dose of IV Lasix, admit to the hospital for observation.  Patient was also placed on diltiazem drip for atrial fibrillation with RVR. Review of Systems: As mentioned in the history of present illness. All other systems reviewed and are negative. Past Medical History:  Diagnosis Date   Allergy    Arthritis    Asthma    Cataract    Chronic constipation    Colon polyps    Cough    GERD (gastroesophageal reflux disease)    Glaucoma    HOH (hard of hearing)    a. Uses hearing aids   Hyperlipidemia    Hypertension    Macular degeneration    Nephrolithiasis    stent   PAF (paroxysmal atrial fibrillation) (HCC)    a. 11/2013 Dx in setting of L MCA stroke. CHA2DS2VASc = 5-->Eliquis; b. 11/2013 Echo: Nl LV fxn. Mildly dil LA.   Stroke [facility])    a. 11/2013 L MCA stroke.   Ureteral stricture    Past Surgical History:  Procedure Laterality Date   CATARACT EXTRACTION W/PHACO Left 12/20/2014   Procedure:  CATARACT EXTRACTION PHACO AND INTRAOCULAR LENS PLACEMENT (IOC);  Surgeon: Galen Manila, MD;  Location: [facility] ORS;  Service: Ophthalmology;  Laterality: Left;   [place]     00:59.6 AP%    21.2 CDE    12.66 fluid pack lot #[id number] H   JOINT REPLACEMENT Left 2014   knee partial replacement   kidney stent Right 1995   Social History:  reports that he has never smoked. He has never been exposed to tobacco smoke. He has never used smokeless tobacco. He reports current alcohol use. He reports that he does not use drugs.  No Known Allergies  Family History  Problem Relation Age of Onset   Arthritis Mother    Cancer Mother 18       kidney   Stroke Father     Prior to Admission medications   Medication Sig Start Date End Date Taking? Authorizing Provider  acetaminophen (TYLENOL) 325 MG tablet Take 2 tablets (650 mg total) by mouth every 6 (six) hours as needed for mild pain (or Fever >/= 101). 03/28/22   Angiulli, Mcarthur Rossetti, PA-C  amLODipine (NORVASC) 5 MG tablet TAKE 1 TABLET(5 MG) BY MOUTH DAILY 04/23/22   Sherlene Shams, MD  aspirin EC 81 MG tablet Take 81 mg by mouth daily. Swallow whole.    [provider]  cholecalciferol (VITAMIN D3) 25 MCG (1000 UNIT) tablet Take 1 tablet (1,000 Units total) by mouth daily. 03/28/22   Angiulli,  Mcarthur Rossettianiel J, PA-C  donepezil (ARICEPT) 10 MG tablet Take 1 tablet (10 mg total) by mouth daily. 03/28/22   Angiulli, Mcarthur Rossettianiel J, PA-C  doxazosin (CARDURA) 4 MG tablet Take 1 tablet (4 mg total) by mouth daily. 03/28/22   Angiulli, Mcarthur Rossettianiel J, PA-C  l-methylfolate-B6-B12 (METANX) 3-35-2 MG TABS tablet Take 1 tablet by mouth daily. 03/28/22   Angiulli, Mcarthur Rossettianiel J, PA-C  latanoprost (XALATAN) 0.005 % ophthalmic solution Place 1 drop into both eyes at bedtime. 10/03/12   [provider]  magnesium gluconate (MAGONATE) 500 MG tablet Take 0.5 tablets (250 mg total) by mouth at bedtime. 03/28/22   Angiulli, Mcarthur Rossettianiel J, PA-C  Multiple Vitamins-Minerals (PRESERVISION AREDS 2 PO) Take  by mouth daily.    [provider]  omeprazole (PRILOSEC) 40 MG capsule Take by mouth 1 capsule daily 03/28/22   Angiulli, Mcarthur Rossettianiel J, PA-C  pindolol (VISKEN) 10 MG tablet Take by mouth 1 capsule daily 03/28/22   Angiulli, Mcarthur Rossettianiel J, PA-C  rosuvastatin (CRESTOR) 20 MG tablet TAKE 1 TABLET(20 MG) BY MOUTH DAILY 04/29/22   Sherlene Shamsullo, Teresa L, MD  sertraline (ZOLOFT) 50 MG tablet Take 1 tablet (50 mg total) by mouth daily. 03/28/22   Angiulli, Mcarthur Rossettianiel J, PA-C  tamsulosin (FLOMAX) 0.4 MG CAPS capsule TAKE 2 CAPSULES(0.8 MG) BY MOUTH DAILY AFTER SUPPER 04/29/22   Sherlene Shamsullo, Teresa L, MD    Physical Exam: Vitals:   04/30/22 1030 04/30/22 1055 04/30/22 1100 04/30/22 1115  BP: (!) 141/112 (!) 132/104 133/84 132/86  Pulse: (!) 156 (!) 110 (!) 59 (!) 48  Resp: 12 19 (!) 26 (!) 23  Temp:      TempSrc:      SpO2: 94%  94% 91%  Weight:      Height:       Physical Exam Constitutional:      General: He is not in acute distress.    Appearance: He is not ill-appearing or toxic-appearing.  HENT:     Head: Normocephalic and atraumatic.     Mouth/Throat:     Mouth: Mucous membranes are moist.     Pharynx: Oropharynx is clear.  Eyes:     Extraocular Movements: Extraocular movements intact.     Pupils: Pupils are equal, round, and reactive to light.  Neck:     Vascular: No JVD.  Cardiovascular:     Rate and Rhythm: Tachycardia present. Rhythm irregular.     Heart sounds: No murmur heard. Pulmonary:     Effort: Pulmonary effort is normal.     Breath sounds: Normal breath sounds.  Abdominal:     General: Bowel sounds are normal.     Palpations: Abdomen is soft. There is no hepatomegaly or splenomegaly.     Tenderness: There is no abdominal tenderness.  Musculoskeletal:        General: Normal range of motion.     Cervical back: Normal range of motion and neck supple.     Right lower leg: No edema.     Left lower leg: No edema.  Skin:    General: Skin is warm and dry.  Neurological:     Mental Status:  He is alert.     Cranial Nerves: No cranial nerve deficit.     Motor: No weakness.     Comments: Alert and oriented to place and person.  Psychiatric:        Mood and Affect: Mood normal.        Behavior: Behavior normal.     Data Reviewed:  Reviewed lab results and the chest x-ray,  Assessment and Plan: Chronic atrial fibrillation with RVR. Patient currently on diltiazem drip, added metoprolol.  Most likely can take off diltiazem tomorrow.  Patient not on anticoagulation due to frequent falls.  Acute on chronic diastolic congestive heart failure. Patient appears to have a mild volume overload, reviewed echocardiogram performed earlier this year, showed ejection fraction 40 to 45%.  Patient has received IV Lasix, I will start oral torsemide tomorrow.  Condition already improving.  If needed, patient can give another dose of Lasix tomorrow.  History of stroke with dysarthria and dysphagia. Appears to be chronic, patient has no history of aspiration pneumonia.  Frequent falls. Plan to set up home PT/OT if discharged tomorrow.  Essential hypertension. Beta-blocker started.    Advance Care Planning:   Code Status: DNR discussed with patient wife and patient, patient is DO NOT RESUSCITATE status.  Consults: None  Family Communication: Wife updated at bedside.  Severity of Illness: The appropriate patient status for this patient is OBSERVATION. Observation status is judged to be reasonable and necessary in order to provide the required intensity of service to ensure the patient's safety. The patient's presenting symptoms, physical exam findings, and initial radiographic and laboratory data in the context of their medical condition is felt to place them at decreased risk for further clinical deterioration. Furthermore, it is anticipated that the patient will be medically stable for discharge from the hospital within 2 midnights of admission.   Author: Marrion Coy, MD 04/30/2022 11:55  AM  For on call review [email or web address].

## 2022-04-30 NOTE — ED Notes (Signed)
Pt ambulated with rolling walker and one assist to toilet

## 2022-04-30 NOTE — ED Triage Notes (Signed)
Pt states that he started having sob yesterday, states that he just got out of the hospital for having a stroke, pt is unable to be on blood thinners due to his frequent falls but is in a.fib. pt is found to have an abnormal pulse in triage and EKG show s.fib rvr with a rate between 74-147

## 2022-05-01 ENCOUNTER — Telehealth: Payer: Self-pay | Admitting: Internal Medicine

## 2022-05-01 DIAGNOSIS — I4891 Unspecified atrial fibrillation: Secondary | ICD-10-CM

## 2022-05-01 DIAGNOSIS — F03911 Unspecified dementia, unspecified severity, with agitation: Secondary | ICD-10-CM | POA: Diagnosis present

## 2022-05-01 DIAGNOSIS — I2510 Atherosclerotic heart disease of native coronary artery without angina pectoris: Secondary | ICD-10-CM | POA: Diagnosis present

## 2022-05-01 DIAGNOSIS — J810 Acute pulmonary edema: Secondary | ICD-10-CM | POA: Diagnosis not present

## 2022-05-01 DIAGNOSIS — I429 Cardiomyopathy, unspecified: Secondary | ICD-10-CM | POA: Diagnosis present

## 2022-05-01 DIAGNOSIS — M1990 Unspecified osteoarthritis, unspecified site: Secondary | ICD-10-CM | POA: Diagnosis present

## 2022-05-01 DIAGNOSIS — N400 Enlarged prostate without lower urinary tract symptoms: Secondary | ICD-10-CM | POA: Diagnosis present

## 2022-05-01 DIAGNOSIS — I69322 Dysarthria following cerebral infarction: Secondary | ICD-10-CM | POA: Diagnosis not present

## 2022-05-01 DIAGNOSIS — I10 Essential (primary) hypertension: Secondary | ICD-10-CM

## 2022-05-01 DIAGNOSIS — I110 Hypertensive heart disease with heart failure: Secondary | ICD-10-CM | POA: Diagnosis present

## 2022-05-01 DIAGNOSIS — R296 Repeated falls: Secondary | ICD-10-CM

## 2022-05-01 DIAGNOSIS — H9190 Unspecified hearing loss, unspecified ear: Secondary | ICD-10-CM | POA: Diagnosis present

## 2022-05-01 DIAGNOSIS — I25118 Atherosclerotic heart disease of native coronary artery with other forms of angina pectoris: Secondary | ICD-10-CM | POA: Diagnosis not present

## 2022-05-01 DIAGNOSIS — E785 Hyperlipidemia, unspecified: Secondary | ICD-10-CM | POA: Diagnosis present

## 2022-05-01 DIAGNOSIS — E876 Hypokalemia: Secondary | ICD-10-CM | POA: Diagnosis not present

## 2022-05-01 DIAGNOSIS — H3530 Unspecified macular degeneration: Secondary | ICD-10-CM | POA: Diagnosis present

## 2022-05-01 DIAGNOSIS — Z823 Family history of stroke: Secondary | ICD-10-CM | POA: Diagnosis not present

## 2022-05-01 DIAGNOSIS — I4821 Permanent atrial fibrillation: Secondary | ICD-10-CM | POA: Diagnosis present

## 2022-05-01 DIAGNOSIS — I482 Chronic atrial fibrillation, unspecified: Secondary | ICD-10-CM | POA: Diagnosis present

## 2022-05-01 DIAGNOSIS — I69391 Dysphagia following cerebral infarction: Secondary | ICD-10-CM

## 2022-05-01 DIAGNOSIS — Z8261 Family history of arthritis: Secondary | ICD-10-CM | POA: Diagnosis not present

## 2022-05-01 DIAGNOSIS — K219 Gastro-esophageal reflux disease without esophagitis: Secondary | ICD-10-CM | POA: Diagnosis present

## 2022-05-01 DIAGNOSIS — H409 Unspecified glaucoma: Secondary | ICD-10-CM | POA: Diagnosis present

## 2022-05-01 DIAGNOSIS — Z66 Do not resuscitate: Secondary | ICD-10-CM | POA: Diagnosis present

## 2022-05-01 DIAGNOSIS — Z79899 Other long term (current) drug therapy: Secondary | ICD-10-CM | POA: Diagnosis not present

## 2022-05-01 DIAGNOSIS — J45909 Unspecified asthma, uncomplicated: Secondary | ICD-10-CM | POA: Diagnosis present

## 2022-05-01 DIAGNOSIS — R0602 Shortness of breath: Secondary | ICD-10-CM | POA: Diagnosis present

## 2022-05-01 DIAGNOSIS — I6930 Unspecified sequelae of cerebral infarction: Secondary | ICD-10-CM | POA: Diagnosis not present

## 2022-05-01 DIAGNOSIS — I5043 Acute on chronic combined systolic (congestive) and diastolic (congestive) heart failure: Secondary | ICD-10-CM

## 2022-05-01 DIAGNOSIS — Z7982 Long term (current) use of aspirin: Secondary | ICD-10-CM | POA: Diagnosis not present

## 2022-05-01 DIAGNOSIS — Z96652 Presence of left artificial knee joint: Secondary | ICD-10-CM | POA: Diagnosis present

## 2022-05-01 HISTORY — DX: Unspecified atrial fibrillation: I48.91

## 2022-05-01 MED ORDER — METOPROLOL SUCCINATE ER 25 MG PO TB24
25.0000 mg | ORAL_TABLET | Freq: Once | ORAL | Status: AC
  Administered 2022-05-01: 25 mg via ORAL
  Filled 2022-05-01: qty 1

## 2022-05-01 MED ORDER — METOPROLOL SUCCINATE ER 50 MG PO TB24: 75.0000 mg | ORAL_TABLET | Freq: Every day | ORAL | Status: DC

## 2022-05-01 MED ORDER — FUROSEMIDE 10 MG/ML IJ SOLN: 40.0000 mg | Freq: Once | INTRAMUSCULAR | Status: DC

## 2022-05-01 MED ORDER — DOXAZOSIN MESYLATE 2 MG PO TABS
2.0000 mg | ORAL_TABLET | Freq: Every day | ORAL | Status: DC
  Administered 2022-05-02 – 2022-05-03 (×2): 2 mg via ORAL
  Filled 2022-05-01 (×2): qty 1

## 2022-05-01 MED ORDER — METOPROLOL SUCCINATE ER 100 MG PO TB24
100.0000 mg | ORAL_TABLET | Freq: Every day | ORAL | Status: DC
  Administered 2022-05-02 – 2022-05-03 (×2): 100 mg via ORAL
  Filled 2022-05-01 (×2): qty 1

## 2022-05-01 NOTE — Progress Notes (Signed)
PROGRESS NOTE    Todd Golden.  ENI:[id number] DOB: May 28, 1930 DOA: 04/30/2022 PCP: Sherlene Shams, MD    Brief Narrative:  This 89 years old male with PMH significant for chronic atrial fibrillation, not on anticoagulation due to recurrent falls, history of stroke with residual dysarthria, dysphagia, chronic systolic heart failure with last LVEF 40 to 45% on recent echocardiogram, benign prostatic hypertrophy,  hypertension, dyslipidemia presented in the ED with complaints of shortness of breath.  Patient reports that he woke up in the morning with shortness of breath and having productive cough.  He also reports some dysphagia but never had any aspiration pneumonia in the past.  Upon arrival in the ED he is found to have BNP 588, EKG shows A-fib with RVR heart rate in 140s.  Patient started on diltiazem drip and admitted for further evaluation.  Cardiology is consulted.  Assessment & Plan:   Principal Problem:   Chronic atrial fibrillation with rapid ventricular response Active Problems:   Frequent falls   History of CVA with residual deficit   Acute on chronic systolic CHF (congestive heart failure)   Dysphagia as late effect of cerebrovascular accident (CVA)  Chronic atrial fibrillation with RVR: Patient presented with shortness of breath and tachycardia. EKG shows A-fib with RVR.  Started on Cardizem drip.   Added metoprolol for heart rate control. Cardiology is consulted.  Patient is not on anticoagulation due to recurrent falls. Recent echocardiogram shows LVEF 40 to 45%, IV Cardizem discontinued.  Started on Toprol 75 mg daily.  Acute on Chronic diastolic congestive heart failure: Patient appears to have a mild volume overload, reviewed echocardiogram performed earlier this year, showed ejection fraction 40 to 45%.  Patient has received IV Lasix,  Continue torsemide 20 mg daily. Appears euvolemic,.  History of stroke with dysarthria and dysphagia. Appears to be  chronic, patient has no history of aspiration pneumonia.   Frequent falls: Plan to set up home PT/OT if discharged tomorrow.   Essential hypertension. Continue Toprol 75 mg daily  Hyperlipidemia Crestor 10 mg daily    DVT prophylaxis: SCDs Code Status: DNR Family Communication: Spouse at bedside. Disposition Plan:    Status is: Observation The patient remains OBS appropriate and will d/c before 2 midnights.   Admitted for A-fib with RVR started on Cardizem drip.  Cardiology is consulted  Consultants:  Cardiology  Procedures: Echo  Antimicrobials: None   Subjective: Patient seen and examined at bedside.  Overnight events noted.  Patient reports doing better.  Denies any chest pain.  Heart rate is now reasonably controlled.  Objective: Vitals:   05/01/22 0400 05/01/22 0618 05/01/22 0814 05/01/22 1126  BP: (!) 127/99  (!) 129/100 (!) 138/101  Pulse: (!) 115  90 (!) 117  Resp: 14  18 (!) 25  Temp: 97.8 F (36.6 C)  (!) 97.5 F (36.4 C) 97.6 F (36.4 C)  TempSrc: Oral  Oral   SpO2: 93%  95% 97%  Weight:  83.5 kg    Height:        Intake/Output Summary (Last 24 hours) at 05/01/2022 1330 Last data filed at 05/01/2022 1200 Gross per 24 hour  Intake 424.75 ml  Output 700 ml  Net -275.25 ml   Filed Weights   04/30/22 0956 04/30/22 2211 05/01/22 0618  Weight: 83.9 kg 83.5 kg 83.5 kg    Examination:  General exam: Appears calm and comfortable, deconditioned. Respiratory system: Clear to auscultation. Respiratory effort normal. Cardiovascular system: S1 & S2 heard, Irregular  rhythm, no murmur. Gastrointestinal system: Abdomen is soft, non tender, non distended, BS+ Central nervous system: Alert and oriented x 3. No focal neurological deficits. Extremities: Symmetric 5 x 5 power. Skin: No rashes, lesions or ulcers Psychiatry: Judgement and insight appear normal. Mood & affect appropriate.     Data Reviewed: I have personally reviewed following labs and  imaging studies  CBC: Recent Labs  Lab 04/30/22 1000  WBC 6.6  HGB 12.8*  HCT 39.8  MCV 93.6  PLT 231   Basic Metabolic Panel: Recent Labs  Lab 04/30/22 1000  NA 140  K 4.0  CL 108  CO2 24  GLUCOSE 121*  BUN 17  CREATININE 1.11  CALCIUM 8.5*   GFR: Estimated Creatinine Clearance: 48.1 mL/min (by C-G formula based on SCr of 1.11 mg/dL). Liver Function Tests: No results for input(s): "AST", "ALT", "ALKPHOS", "BILITOT", "PROT", "ALBUMIN" in the last 168 hours. No results for input(s): "LIPASE", "AMYLASE" in the last 168 hours. No results for input(s): "AMMONIA" in the last 168 hours. Coagulation Profile: No results for input(s): "INR", "PROTIME" in the last 168 hours. Cardiac Enzymes: No results for input(s): "CKTOTAL", "CKMB", "CKMBINDEX", "TROPONINI" in the last 168 hours. BNP (last 3 results) No results for input(s): "PROBNP" in the last 8760 hours. HbA1C: No results for input(s): "HGBA1C" in the last 72 hours. CBG: No results for input(s): "GLUCAP" in the last 168 hours. Lipid Profile: No results for input(s): "CHOL", "HDL", "LDLCALC", "TRIG", "CHOLHDL", "LDLDIRECT" in the last 72 hours. Thyroid Function Tests: No results for input(s): "TSH", "T4TOTAL", "FREET4", "T3FREE", "THYROIDAB" in the last 72 hours. Anemia Panel: No results for input(s): "VITAMINB12", "FOLATE", "FERRITIN", "TIBC", "IRON", "RETICCTPCT" in the last 72 hours. Sepsis Labs: No results for input(s): "PROCALCITON", "LATICACIDVEN" in the last 168 hours.  No results found for this or any previous visit (from the past 240 hour(s)).   Radiology Studies: DG Chest 2 View  Result Date: 04/30/2022 CLINICAL DATA:  Shortness of breath EXAM: CHEST - 2 VIEW COMPARISON:  07/12/2021 FINDINGS: Transverse diameter of heart is increased. Central pulmonary vessels are prominent. There is increase in interstitial markings in the parahilar regions and lower lung fields. There is minimal blunting of lateral CP  angles. There is no pneumothorax. Severe degenerative changes are noted in both shoulders. IMPRESSION: Cardiomegaly. Central pulmonary vessels are more prominent. There is increase in interstitial markings in the parahilar regions and lower lung fields suggesting possible mild interstitial pulmonary edema. Small bilateral pleural effusions. Electronically Signed   By: Ernie Avena M.D.   On: 04/30/2022 11:04    Scheduled Meds:  aspirin EC  81 mg Oral Daily   donepezil  10 mg Oral Daily   [START ON 05/02/2022] doxazosin  2 mg Oral Daily   enoxaparin (LOVENOX) injection  40 mg Subcutaneous Q24H   latanoprost  1 drop Both Eyes QHS   [START ON 05/02/2022] metoprolol succinate  75 mg Oral Daily   pantoprazole  40 mg Oral Daily   rosuvastatin  10 mg Oral Daily   sertraline  50 mg Oral Daily   sodium chloride flush  3 mL Intravenous Q12H   tamsulosin  0.4 mg Oral Daily   torsemide  20 mg Oral Daily   Continuous Infusions:  sodium chloride       LOS: 0 days    Time spent: 50 mins    Willeen Niece, MD [facility]   If 7PM-7AM, please contact night-coverage

## 2022-05-01 NOTE — Consult Note (Signed)
Cardiology Consultation   Patient ID: Todd Golden. MRN: [id number]; DOB: Jul 06, 1930  Admit date: 04/30/2022 Date of Consult: 05/01/2022  PCP:  Sherlene Shams, MD   [facility] Providers Cardiologist:  Lorine Bears, MD   {  Patient Profile:   Todd Golden. is a 89 y.o. male with a hx of permanent Afib not on a/c due to falls, h/o stroke, HTN, HLD, dementia, glaucoma who is being seen 05/01/2022 for the evaluation of rapid Atrial fibrillation at the request of Dr. Vale Haven.  History of Present Illness:   Mr. Atnip is followed by Dr. Kirke Corin for the above cardiac issues. The patient was admitted in November 2015 with expressive aphasia and mild ataxia due to left MCA stroke. Found to have Afib placed on Eliquis. Echo showed normal LVEF. Carotid dopplers showed no obstructive disease. He was later taken off Eliquis due to recurrent falls.   He was last seen 01/2022 and was stable from a cardiac perspective.   The patient presented to the ER 04/30/22 for shortness of breath. Started suddenly the day before presentation. No chest pain reported. Patient was taking HCTZ at home. Drinks a lot of fluid.    In the ER BP 123/104, pulse 130bpm, RR 18, afebrile, normal O2. EKG showed Afib RVR, 127bpm. Labs showed Hgb 12.8, K 4.0, Scr 1.11, BUN 17, BNP 588. HS trop 17. CXR showed increased. The patient was started on IV dilt and given IV lasix and admitted for further work-up.  Past Medical History:  Diagnosis Date   Allergy    Arthritis    Asthma    Cataract    Chronic constipation    Colon polyps    Cough    GERD (gastroesophageal reflux disease)    Glaucoma    HOH (hard of hearing)    a. Uses hearing aids   Hyperlipidemia    Hypertension    Macular degeneration    Nephrolithiasis    stent   PAF (paroxysmal atrial fibrillation)    a. 11/2013 Dx in setting of L MCA stroke. CHA2DS2VASc = 5-->Eliquis; b. 11/2013 Echo: Nl LV fxn. Mildly dil LA.   Stroke    a.  11/2013 L MCA stroke.   Ureteral stricture     Past Surgical History:  Procedure Laterality Date   CATARACT EXTRACTION W/PHACO Left 12/20/2014   Procedure: CATARACT EXTRACTION PHACO AND INTRAOCULAR LENS PLACEMENT (IOC);  Surgeon: Galen Manila, MD;  Location: [facility] ORS;  Service: Ophthalmology;  Laterality: Left;   [place]     00:59.6 AP%    21.2 CDE    12.66 fluid pack lot #[id number]   JOINT REPLACEMENT Left 2014   knee partial replacement   kidney stent Right 1995     Home Medications:  Prior to Admission medications   Medication Sig Start Date End Date Taking? Authorizing Provider  acetaminophen (TYLENOL) 325 MG tablet Take 2 tablets (650 mg total) by mouth every 6 (six) hours as needed for mild pain (or Fever >/= 101). 03/28/22  Yes Angiulli, Mcarthur Rossetti, PA-C  aspirin EC 81 MG tablet Take 81 mg by mouth daily. Swallow whole.   Yes [provider]  cholecalciferol (VITAMIN D3) 25 MCG (1000 UNIT) tablet Take 1 tablet (1,000 Units total) by mouth daily. 03/28/22  Yes Angiulli, Mcarthur Rossetti, PA-C  donepezil (ARICEPT) 10 MG tablet Take 1 tablet (10 mg total) by mouth daily. 03/28/22  Yes Angiulli, Mcarthur Rossetti, PA-C  doxazosin (CARDURA) 4 MG tablet Take  1 tablet (4 mg total) by mouth daily. 03/28/22  Yes Angiulli, Mcarthur Rossetti, PA-C  hydrochlorothiazide (HYDRODIURIL) 25 MG tablet Take 25 mg by mouth daily.   Yes [provider]  l-methylfolate-B6-B12 (METANX) 3-35-2 MG TABS tablet Take 1 tablet by mouth daily. 03/28/22  Yes Angiulli, Mcarthur Rossetti, PA-C  latanoprost (XALATAN) 0.005 % ophthalmic solution Place 1 drop into both eyes at bedtime. 10/03/12  Yes [provider]  magnesium gluconate (MAGONATE) 500 MG tablet Take 0.5 tablets (250 mg total) by mouth at bedtime. 03/28/22  Yes Angiulli, Mcarthur Rossetti, PA-C  Multiple Vitamins-Minerals (PRESERVISION AREDS 2 PO) Take by mouth daily.   Yes [provider]  omeprazole (PRILOSEC) 40 MG capsule Take by mouth 1 capsule daily 03/28/22  Yes  Angiulli, Mcarthur Rossetti, PA-C  pindolol (VISKEN) 10 MG tablet Take by mouth 1 capsule daily 03/28/22  Yes Angiulli, Mcarthur Rossetti, PA-C  rosuvastatin (CRESTOR) 20 MG tablet TAKE 1 TABLET(20 MG) BY MOUTH DAILY 04/29/22  Yes Sherlene Shams, MD  sertraline (ZOLOFT) 50 MG tablet Take 1 tablet (50 mg total) by mouth daily. 03/28/22  Yes Angiulli, Mcarthur Rossetti, PA-C  tamsulosin (FLOMAX) 0.4 MG CAPS capsule TAKE 2 CAPSULES(0.8 MG) BY MOUTH DAILY AFTER SUPPER 04/29/22  Yes Sherlene Shams, MD  amLODipine (NORVASC) 5 MG tablet TAKE 1 TABLET(5 MG) BY MOUTH DAILY Patient not taking: Reported on 04/30/2022 04/23/22   Sherlene Shams, MD  simvastatin (ZOCOR) 20 MG tablet Take 20 mg by mouth daily. Patient not taking: Reported on 04/30/2022    [provider]    Inpatient Medications: Scheduled Meds:  aspirin EC  81 mg Oral Daily   donepezil  10 mg Oral Daily   doxazosin  4 mg Oral Daily   enoxaparin (LOVENOX) injection  40 mg Subcutaneous Q24H   latanoprost  1 drop Both Eyes QHS   metoprolol succinate  50 mg Oral Daily   pantoprazole  40 mg Oral Daily   rosuvastatin  10 mg Oral Daily   sertraline  50 mg Oral Daily   sodium chloride flush  3 mL Intravenous Q12H   tamsulosin  0.4 mg Oral Daily   torsemide  20 mg Oral Daily   Continuous Infusions:  sodium chloride     diltiazem (CARDIZEM) infusion 5 mg/hr (05/01/22 1048)   PRN Meds: sodium chloride, acetaminophen, acetaminophen, magnesium hydroxide, ondansetron (ZOFRAN) IV, sodium chloride flush  Allergies:   No Known Allergies  Social History:   Social History   Socioeconomic History   Marital status: Married    Spouse name: Khaled Whatcott   Number of children: 3   Years of education: 12   Highest education level: 12th grade  Occupational History   Not on file  Tobacco Use   Smoking status: Never    Passive exposure: Never   Smokeless tobacco: Never  Vaping Use   Vaping Use: Never used  Substance and Sexual Activity   Alcohol use: Yes    Comment:  rarely   Drug use: No   Sexual activity: Not Currently  Other Topics Concern   Not on file  Social History Narrative   ** Merged History Encounter **       Social Determinants of Health   Financial Resource Strain: Low Risk  (05/23/2021)   Overall Financial Resource Strain (CARDIA)    Difficulty of Paying Living Expenses: Not hard at all  Food Insecurity: No Food Insecurity (04/30/2022)   Hunger Vital Sign    Worried About Running  Out of Food in the Last Year: Never true    Ran Out of Food in the Last Year: Never true  Transportation Needs: No Transportation Needs (04/30/2022)   PRAPARE - Administrator, Civil ServiceTransportation    Lack of Transportation (Medical): No    Lack of Transportation (Non-Medical): No  Physical Activity: Sufficiently Active (11/14/2020)   Exercise Vital Sign    Days of Exercise per Week: 4 days    Minutes of Exercise per Session: 50 min  Stress: No Stress Concern Present (05/23/2021)   [employer] - Occupational Stress Questionnaire    Feeling of Stress : Not at all  Social Connections: Moderately Integrated (05/23/2021)   Social Connection and Isolation Panel [NHANES]    Frequency of Communication with Friends and Family: More than three times a week    Frequency of Social Gatherings with Friends and Family: More than three times a week    Attends Religious Services: More than 4 times per year    Active Member of [employer]ubs or Organizations: No    Attends BankerClub or Organization Meetings: Never    Marital Status: Married  Catering managerntimate Partner Violence: Not At Risk (04/30/2022)   Humiliation, Afraid, Rape, and Kick questionnaire    Fear of Current or Ex-Partner: No    Emotionally Abused: No    Physically Abused: No    Sexually Abused: No    Family History:    Family History  Problem Relation Age of Onset   Arthritis Mother    Cancer Mother [id number]       kidney   Stroke Father      ROS:  Please see the history of present illness.   All other ROS reviewed and  negative.     Physical Exam/Data:   Vitals:   05/01/22 0000 05/01/22 0400 05/01/22 0618 05/01/22 0814  BP: (!) 123/100 (!) 127/99  (!) 129/100  Pulse: (!) 115 (!) 115  90  Resp:  14  18  Temp: 97.8 F (36.6 C) 97.8 F (36.6 C)  (!) 97.5 F (36.4 C)  TempSrc: Oral Oral  Oral  SpO2: 96% 93%  95%  Weight:   83.5 kg   Height:        Intake/Output Summary (Last 24 hours) at 05/01/2022 1116 Last data filed at 05/01/2022 1048 Gross per 24 hour  Intake 424.75 ml  Output 750 ml  Net -325.25 ml      05/01/2022    6:18 AM 04/30/2022   10:11 PM 04/30/2022    9:56 AM  Last 3 Weights  Weight (lbs) 184 lb 1.4 oz 184 lb 1.4 oz 185 lb  Weight (kg) 83.5 kg 83.5 kg 83.915 kg     Body mass index is 25.67 kg/m.  General:  Well nourished, well developed, in no acute distress HEENT: normal Neck: no JVD Vascular: No carotid bruits; Distal pulses 2+ bilaterally Cardiac:  normal S1, S2; Irreg Irreg; no murmur  Lungs:  clear to auscultation bilaterally, no wheezing, rhonchi or rales  Abd: soft, nontender, no hepatomegaly  Ext: no edema Musculoskeletal:  No deformities, BUE and BLE strength normal and equal Skin: warm and dry  Neuro:  CNs 2-12 intact, no focal abnormalities noted Psych:  Normal affect   EKG:  The EKG was personally reviewed and demonstrates:  Afib 127bpm Telemetry:  Telemetry was personally reviewed and demonstrates:  Afiib HR 90-100  Relevant CV Studies:  Echo 03/16/24 1. Left ventricular ejection fraction, by estimation, is 40 to 45%. The  left ventricle has mildly decreased function. The left ventricle  demonstrates global hypokinesis. Left ventricular diastolic function could  not be evaluated.   2. Right ventricular systolic function is normal. The right ventricular  size is normal. There is normal pulmonary artery systolic pressure. The  estimated right ventricular systolic pressure is 19.2 mmHg.   3. The mitral valve is normal in structure. Trivial mitral valve   regurgitation. No evidence of mitral stenosis.   4. The aortic valve is normal in structure. Aortic valve regurgitation is  not visualized. Aortic valve sclerosis/calcification is present, without  any evidence of aortic stenosis. Aortic valve area, by VTI measures 2.26  cm. Aortic valve mean gradient  measures 2.7 mmHg. Aortic valve Vmax measures 1.15 m/s.   5. Aortic dilatation noted. There is mild dilatation of the ascending  aorta, measuring 41 mm.   6. The inferior vena cava is normal in size with greater than 50%  respiratory variability, suggesting right atrial pressure of 3 mmHg.   Laboratory Data:  High Sensitivity Troponin:   Recent Labs  Lab 04/30/22 0959  TROPONINIHS 17     Chemistry Recent Labs  Lab 04/30/22 1000  NA 140  K 4.0  CL 108  CO2 24  GLUCOSE 121*  BUN 17  CREATININE 1.11  CALCIUM 8.5*  GFRNONAA >60  ANIONGAP 8    No results for input(s): "PROT", "ALBUMIN", "AST", "ALT", "ALKPHOS", "BILITOT" in the last 168 hours. Lipids No results for input(s): "CHOL", "TRIG", "HDL", "LABVLDL", "LDLCALC", "CHOLHDL" in the last 168 hours.  Hematology Recent Labs  Lab 04/30/22 1000  WBC 6.6  RBC 4.25  HGB 12.8*  HCT 39.8  MCV 93.6  MCH 30.1  MCHC 32.2  RDW 13.6  PLT 231   Thyroid No results for input(s): "TSH", "FREET4" in the last 168 hours.  BNP Recent Labs  Lab 04/30/22 0959  BNP 588.3*    DDimer No results for input(s): "DDIMER" in the last 168 hours.   Radiology/Studies:  DG Chest 2 View  Result Date: 04/30/2022 CLINICAL DATA:  Shortness of breath EXAM: CHEST - 2 VIEW COMPARISON:  07/12/2021 FINDINGS: Transverse diameter of heart is increased. Central pulmonary vessels are prominent. There is increase in interstitial markings in the parahilar regions and lower lung fields. There is minimal blunting of lateral CP angles. There is no pneumothorax. Severe degenerative changes are noted in both shoulders. IMPRESSION: Cardiomegaly. Central  pulmonary vessels are more prominent. There is increase in interstitial markings in the parahilar regions and lower lung fields suggesting possible mild interstitial pulmonary edema. Small bilateral pleural effusions. Electronically Signed   By: Ernie Avena M.D.   On: 04/30/2022 11:04     Assessment and Plan:   Permanent Afib, now with RVR - h/o permanent afib previously on pindolol - presented with SOB found to be in rapid afib started on IV dilt - EF 40-45%, stop IV dilt - started on Toprol 75mg  daily - heart rates better 90-100 - not on a/c due to falls - re-check echo - Given recurrent stores may need to revisit blood thinner or watchman device  HFmrEF - echo 02/2022 showed LVEF 40-45% in the setting of stroke - BNP >588 and CXR showed increased interstitial markings and pulmonary edema, small b/l pleural effusions - PTA HCTZ - started on torsemide 20mg  daily  H/o stroke in 2015 and 02/2022 - PTA Aspirin 81mg  daily  HTN - Bps reasonable  HLD - continue Crestor 10mg  daily  For questions or updates,  please contact [facility] Please consult [email or web address] for contact info under    Signed, Evamae Rowen David Stall, PA-C  05/01/2022 11:16 AM

## 2022-05-01 NOTE — Discharge Instructions (Signed)

## 2022-05-01 NOTE — Telephone Encounter (Signed)
Patient's wife called and wanted Dr Darrick Huntsman to know that Patient is in the hospital with A fib.

## 2022-05-01 NOTE — Progress Notes (Signed)
Nutrition Brief Note  Patient identified on the Malnutrition Screening Tool (MST) Report  Wt Readings from Last 15 Encounters:  05/01/22 83.5 kg  04/03/22 84.2 kg  03/24/22 82.5 kg  03/16/22 79.3 kg  02/15/22 85.8 kg  12/17/21 85.3 kg  11/02/21 85.2 kg  07/12/21 88.3 kg  05/23/21 83.5 kg  05/17/21 83.5 kg  11/17/20 85.1 kg  11/02/20 86.4 kg  09/14/20 86.2 kg  08/16/20 88.5 kg  05/04/20 87.8 kg   Pt with PMH significant for chronic atrial fibrillation, not on anticoagulation due to recurrent falls, history of stroke with residual dysarthria, dysphagia, chronic systolic heart failure with last LVEF 40 to 45% on recent echocardiogram, benign prostatic hypertrophy,  hypertension, dyslipidemia presented in the ED with complaints of shortness of breath.  Pt admitted with CHF and a-fib with RVR.   Pt sitting in recliner chair, eating lunch at time of visit. Pt with aphasia and deferred his to wife. Per wife, pt with good appetite. Pt consumes 3 meals per day. Pt is very active and was making progress with physical therapy at home.   Reviewed wt hx; wt has been stable over the past year. Pt wife denies that pt has lost weight.  Nutrition-Focused physical exam completed. Findings are no fat depletion, no muscle depletion, and no edema.    Medications reviewed and include demadex.   Labs reviewed: CBGS: 119 (inpatient orders for glycemic control are none).    Current diet order is heart healthy (liberalized to 2 gram sodium), patient is consuming approximately 75% of meals at this time. Labs and medications reviewed.   No nutrition interventions warranted at this time. If nutrition issues arise, please consult RD.   Levada Schilling, RD, LDN, CDCES Registered Dietitian II Certified Diabetes Care and Education Specialist Please refer to [facility] for RD and/or RD on-call/weekend/after hours pager

## 2022-05-01 NOTE — Telephone Encounter (Signed)
FYI

## 2022-05-02 DIAGNOSIS — I214 Non-ST elevation (NSTEMI) myocardial infarction: Secondary | ICD-10-CM

## 2022-05-02 DIAGNOSIS — I482 Chronic atrial fibrillation, unspecified: Secondary | ICD-10-CM | POA: Diagnosis not present

## 2022-05-02 DIAGNOSIS — I5043 Acute on chronic combined systolic (congestive) and diastolic (congestive) heart failure: Secondary | ICD-10-CM | POA: Diagnosis not present

## 2022-05-02 DIAGNOSIS — I25118 Atherosclerotic heart disease of native coronary artery with other forms of angina pectoris: Secondary | ICD-10-CM

## 2022-05-02 DIAGNOSIS — I4891 Unspecified atrial fibrillation: Secondary | ICD-10-CM | POA: Diagnosis not present

## 2022-05-02 DIAGNOSIS — J810 Acute pulmonary edema: Secondary | ICD-10-CM | POA: Diagnosis not present

## 2022-05-02 HISTORY — DX: Non-ST elevation (NSTEMI) myocardial infarction: I21.4

## 2022-05-02 LAB — BASIC METABOLIC PANEL
Anion gap: 10 (ref 5–15)
BUN: 15 mg/dL (ref 8–23)
CO2: 28 mmol/L (ref 22–32)
Calcium: 8.6 mg/dL — ABNORMAL LOW (ref 8.9–10.3)
Chloride: 101 mmol/L (ref 98–111)
Creatinine, Ser: 1.34 mg/dL — ABNORMAL HIGH (ref 0.61–1.24)
GFR, Estimated: 51 mL/min — ABNORMAL LOW (ref >60)
Glucose, Bld: 100 mg/dL — ABNORMAL HIGH (ref 70–99)
Potassium: 3 mmol/L — ABNORMAL LOW (ref 3.5–5.1)
Sodium: 139 mmol/L (ref 135–145)

## 2022-05-02 MED ORDER — POTASSIUM CHLORIDE CRYS ER 20 MEQ PO TBCR
40.0000 meq | EXTENDED_RELEASE_TABLET | ORAL | Status: AC
  Administered 2022-05-02 (×2): 40 meq via ORAL
  Filled 2022-05-02 (×2): qty 2

## 2022-05-02 MED ORDER — DIGOXIN 0.25 MG/ML IJ SOLN
0.2500 mg | Freq: Once | INTRAMUSCULAR | Status: AC
  Administered 2022-05-02: 0.25 mg via INTRAVENOUS
  Filled 2022-05-02: qty 2

## 2022-05-02 MED ORDER — DILTIAZEM HCL 30 MG PO TABS
60.0000 mg | ORAL_TABLET | Freq: Four times a day (QID) | ORAL | Status: DC
  Administered 2022-05-02: 60 mg via ORAL
  Filled 2022-05-02: qty 2

## 2022-05-02 MED ORDER — AMIODARONE HCL 200 MG PO TABS
200.0000 mg | ORAL_TABLET | Freq: Two times a day (BID) | ORAL | Status: DC
  Administered 2022-05-02 – 2022-05-03 (×2): 200 mg via ORAL
  Filled 2022-05-02 (×2): qty 1

## 2022-05-02 NOTE — TOC Initial Note (Signed)
Transition of Care (TOC) - Initial/Assessment Note    Patient Details  Name: Todd Golden. MRN: [id number] Date of Birth: 1930/07/21  Transition of Care [facility]) CM/SW Contact:    Truddie Hidden, RN Phone Number: 05/02/2022, 11:34 AM  Clinical Narrative:                  Transition of Care [facility]) Screening Note   Patient Details  Name: Todd Golden. Date of Birth: 02-21-1930   Transition of Care [facility]) CM/SW Contact:    Truddie Hidden, RN Phone Number: 05/02/2022, 11:35 AM    Transition of Care Department [facility]) has reviewed patient and no TOC needs have been identified at this time. We will continue to monitor patient advancement through interdisciplinary progression rounds. If new patient transition needs arise, please place a TOC consult.           Patient Goals and CMS Choice            Expected Discharge Plan and Services                                              Prior Living Arrangements/Services                       Activities of Daily Living Home Assistive Devices/Equipment: Raised toilet seat with rails, Shower chair with back, Environmental consultant (specify type) Clinical research associate) ADL Screening (condition at time of admission) Patient's cognitive ability adequate to safely complete daily activities?: Yes Is the patient deaf or have difficulty hearing?: No Does the patient have difficulty seeing, even when wearing glasses/contacts?: No Does the patient have difficulty concentrating, remembering, or making decisions?: Yes Patient able to express need for assistance with ADLs?: Yes Does the patient have difficulty dressing or bathing?: Yes Independently performs ADLs?: No Communication: Needs assistance (dysphagia) Is this a change from baseline?: Pre-admission baseline Dressing (OT): Independent, Needs assistance Is this a change from baseline?: Pre-admission baseline (Sometimes he needs help) Grooming: Needs  assistance, Independent (sometimes he needs help) Is this a change from baseline?: Pre-admission baseline Feeding: Independent Bathing: Needs assistance Is this a change from baseline?: Pre-admission baseline (needs assistance at times) Toileting: Needs assistance Is this a change from baseline?: Pre-admission baseline (Needs assistance) In/Out Bed: Needs assistance Is this a change from baseline?: Pre-admission baseline (Needs assistance at times) Walks in Home: Needs assistance (stand by) Is this a change from baseline?: Pre-admission baseline Does the patient have difficulty walking or climbing stairs?: Yes Weakness of Legs: Both Weakness of Arms/Hands: Both  Permission Sought/Granted                  Emotional Assessment              Admission diagnosis:  Acute pulmonary edema [J81.0] Atrial fibrillation with RVR [I48.91] Chronic atrial fibrillation with rapid ventricular response [I48.20] Chronic atrial fibrillation with RVR [I48.20] Patient Active Problem List   Diagnosis Date Noted   Chronic atrial fibrillation with RVR 05/01/2022   Atrial fibrillation with RVR 05/01/2022   Acute pulmonary edema 05/01/2022   Chronic atrial fibrillation with rapid ventricular response 04/30/2022   Acute on chronic systolic CHF (congestive heart failure) 04/30/2022   Dysphagia as late effect of cerebrovascular accident (CVA) 04/30/2022   Systolic dysfunction with heart failure 04/06/2022   Hospital  discharge follow-up 04/06/2022   History of CVA with residual deficit 03/19/2022   Arteriosclerotic dementia with depression 03/16/2022   Right leg weakness 03/16/2022   Acute CVA (cerebrovascular accident) 03/15/2022   Frequent falls 12/18/2021   Acquired deformity of joint of big toe of left foot 12/18/2021   Swallowing dysfunction 12/18/2021   Memory loss, short term 01/04/2021   Allergic rhinitis due to animal (cat) (dog) hair and dander 08/16/2020   Allergic rhinitis due to  pollen 08/16/2020   Proximal leg weakness 08/03/2020   Hx of completed stroke 02/02/2020   Weakness 02/02/2020   Diffuse brain atrophy 12/09/2019   Balance problem 12/03/2019   Acquired thrombophilia 12/03/2019   Prediabetes 05/29/2019   Benign prostatic hyperplasia with urinary frequency 08/20/2018   Hearing aid worn 08/16/2017   Expressive aphasia 08/16/2017   History of cataract surgery 08/27/2016   Age-related macular degeneration 08/27/2016   Counseling regarding end of life decision making 08/27/2016   Do not resuscitate status 08/27/2016   BPH associated with nocturia 02/11/2015   Paroxysmal atrial fibrillation 12/14/2013   Dyslipidemia 12/12/2013   CVD (cerebrovascular disease) 12/10/2013   GERD (gastroesophageal reflux disease) 07/25/2013   Bilateral hand numbness 07/25/2013   Chronic constipation 01/21/2013   Encounter for preventive health examination 01/21/2013   Essential hypertension, benign 10/21/2012   Glaucoma 10/21/2012   History of colonic polyps 10/21/2012   Ureteral stricture, right 10/21/2012   PCP:  Sherlene Shams, MD Pharmacy:   [id number] [place], [place] - [address] AT [facility] [address] [facility] [address] [place] [address] Phone: [phone] Fax: [phone]     Social Determinants of Health (SDOH) Social History: SDOH Screenings   Food Insecurity: No Food Insecurity (04/30/2022)  Housing: Low Risk  (04/30/2022)  Transportation Needs: No Transportation Needs (04/30/2022)  Utilities: Not At Risk (04/30/2022)  Alcohol Screen: Low Risk  (11/14/2020)  Depression (PHQ2-9): Low Risk  (04/03/2022)  Financial Resource Strain: Low Risk  (05/23/2021)  Physical Activity: Sufficiently Active (11/14/2020)  Social Connections: Moderately Integrated (05/23/2021)  Stress: No Stress Concern Present (05/23/2021)  Tobacco Use: Low Risk  (04/30/2022)   SDOH Interventions:     Readmission Risk Interventions     No data  to display

## 2022-05-02 NOTE — Progress Notes (Signed)
Rounding Note    Patient Name: Todd Golden. Date of Encounter: 05/02/2022  [facility] Cardiologist: Lorine Bears, MD   Subjective   Wife at the bedside He is agitated this morning, reports unable to relax, sleep as telemetry monitor continues to alarm next to his bed  " every 3 minutes" Heart rate remains elevated 110 up to 120, While we are talking, he is agitated heart rate up to 130 even 140 bpm briefly No other complaints, as indicated he would like to go home  Pindolol held, increasing dosing of metoprolol succinate, received 100 mg this morning  Inpatient Medications    Scheduled Meds:  aspirin EC  81 mg Oral Daily   donepezil  10 mg Oral Daily   doxazosin  2 mg Oral Daily   enoxaparin (LOVENOX) injection  40 mg Subcutaneous Q24H   latanoprost  1 drop Both Eyes QHS   metoprolol succinate  100 mg Oral Daily   pantoprazole  40 mg Oral Daily   potassium chloride  40 mEq Oral Q2H   rosuvastatin  10 mg Oral Daily   sertraline  50 mg Oral Daily   sodium chloride flush  3 mL Intravenous Q12H   tamsulosin  0.4 mg Oral Daily   torsemide  20 mg Oral Daily   Continuous Infusions:  sodium chloride     PRN Meds: sodium chloride, acetaminophen, acetaminophen, magnesium hydroxide, ondansetron (ZOFRAN) IV, sodium chloride flush   Vital Signs    Vitals:   05/02/22 0724 05/02/22 0912 05/02/22 1107 05/02/22 1241  BP: (!) 138/111 (!) 128/107 (!) 134/105 (!) 132/108  Pulse: (!) 123  (!) 132 (!) 55  Resp: (!) 22 16 18 17   Temp: 97.8 F (36.6 C)  97.7 F (36.5 C) (!) 97.5 F (36.4 C)  TempSrc: Oral  Oral Oral  SpO2: 94% 97% 96% 96%  Weight:      Height:        Intake/Output Summary (Last 24 hours) at 05/02/2022 1318 Last data filed at 05/02/2022 0739 Gross per 24 hour  Intake 243 ml  Output 965 ml  Net -722 ml      05/02/2022    5:39 AM 05/01/2022    6:18 AM 04/30/2022   10:11 PM  Last 3 Weights  Weight (lbs) 183 lb 184 lb 1.4 oz 184 lb 1.4 oz   Weight (kg) 83.008 kg 83.5 kg 83.5 kg      Telemetry    Atrial fibrillation rate 1 10-1 20 for the past 24 hours- Personally Reviewed  ECG     - Personally Reviewed  Physical Exam   GEN: No acute distress.,  Neck: No JVD Cardiac: Irregularly irregular, no murmurs, rubs, or gallops.  Respiratory: Clear to auscultation bilaterally. GI: Soft, nontender, non-distended  MS: No edema; No deformity. Neuro:  Nonfocal  Psych: Agitated  Labs    High Sensitivity Troponin:   Recent Labs  Lab 04/30/22 0959  TROPONINIHS 17     Chemistry Recent Labs  Lab 04/30/22 1000 05/02/22 0836  NA 140 139  K 4.0 3.0*  CL 108 101  CO2 24 28  GLUCOSE 121* 100*  BUN 17 15  CREATININE 1.11 1.34*  CALCIUM 8.5* 8.6*  GFRNONAA >60 51*  ANIONGAP 8 10    Lipids No results for input(s): "CHOL", "TRIG", "HDL", "LABVLDL", "LDLCALC", "CHOLHDL" in the last 168 hours.  Hematology Recent Labs  Lab 04/30/22 1000  WBC 6.6  RBC 4.25  HGB 12.8*  HCT  39.8  MCV 93.6  MCH 30.1  MCHC 32.2  RDW 13.6  PLT 231   Thyroid No results for input(s): "TSH", "FREET4" in the last 168 hours.  BNP Recent Labs  Lab 04/30/22 0959  BNP 588.3*    DDimer No results for input(s): "DDIMER" in the last 168 hours.   Radiology    No results found.  Cardiac Studies     Patient Profile     Cutter Crusan. is a 89 y.o. male with a hx of permanent Afib not on a/c due to falls, h/o stroke, HTN, HLD, dementia, glaucoma who is being seen 05/01/2022 for the evaluation of rapid Atrial fibrillation   Assessment & Plan    atrial fibrillation with RVR Has been on pindolol 10 mg as outpatient, presenting with poor rate control, CHF symptoms We will discontinue diltiazem infusion, transition to metoprolol succinate  Has been titrated up to 100 mg daily with minimal improvement of his rate  -Will plan on giving dose of digoxin 0.25 IV x 1  -On diltiazem infusion rate was better control, we start  diltiazem 60  mg every 6 Pindolol on hold Currently not on anticoagulation given high fall risk Will defer management to primary cardiologist Wife reports 18 falls last year, risk of on anticoagulation likely significant given gait instability May not be a candidate for Watchman device given age 89   Acute diastolic and systolic CHF Pulmonary edema on chest x-ray, BNP 600, small bilateral pleural effusions Given dose of IV Lasix 2 days ago with transition to torsemide yesterday continue torsemide 20 daily, HCTZ on hold    Essential hypertension Cardura down to 2 mg given  Metoprolol succinate up to 100 HCTZ on hold Will start diltiazem 60 every 6  Agitation Agitated by telemetry next to his bed, does not like being in the hospital, difficulty sleeping May need mild sedative   Total encounter time more than 50 minutes  Greater than 50% was spent in counseling and coordination of care with the patient     For questions or updates, please contact  [facility] Please consult [email or web address] for contact info under        Signed, Julien Nordmann, MD  05/02/2022, 1:18 PM

## 2022-05-02 NOTE — Progress Notes (Signed)
PROGRESS NOTE    Todd Golden.  EHM:[id number] DOB: 02-19-1930 DOA: 04/30/2022 PCP: Sherlene Shams, MD    Brief Narrative:  This 89 years old male with PMH significant for chronic atrial fibrillation, not on anticoagulation due to recurrent falls, history of stroke with residual dysarthria, dysphagia, chronic systolic heart failure with last LVEF 40 to 45% on recent echocardiogram, benign prostatic hypertrophy,  hypertension, dyslipidemia presented in the ED with complaints of shortness of breath.  Patient reports that he woke up in the morning with shortness of breath and having productive cough.  He also reports some dysphagia but never had any aspiration pneumonia in the past.  Upon arrival in the ED he is found to have BNP 588, EKG shows A-fib with RVR heart rate in 140s.  Patient started on diltiazem drip and admitted for further evaluation.  Cardiology is consulted.  Assessment & Plan:   Principal Problem:   Chronic atrial fibrillation with rapid ventricular response Active Problems:   Frequent falls   History of CVA with residual deficit   Acute on chronic systolic CHF (congestive heart failure)   Dysphagia as late effect of cerebrovascular accident (CVA)   Chronic atrial fibrillation with RVR   Atrial fibrillation with RVR   Acute pulmonary edema  Chronic atrial fibrillation with RVR: Patient presented with shortness of breath and tachycardia. EKG showed A-fib with RVR.  Started on Cardizem drip.   Added metoprolol for heart rate control. Cardiology is consulted.  Patient is not on anticoagulation due to recurrent falls. Recent echocardiogram shows LVEF 40 to 45%, IV Cardizem discontinued.  Started on Toprol 75 mg daily uptitrated to 100 mg today. Heart rate is up today as patient was agitated and restless due to telemetry near by constantly bothering him. Started on Cardizem 60 every 6 hours.  Given digoxin x 1. continue to monitor  Acute on Chronic diastolic  congestive heart failure: Patient appears to have a mild volume overload, reviewed echocardiogram performed earlier this year, showed ejection fraction 40 to 45%.   Patient has received IV Lasix, transitioned to torsemide. Continue torsemide 20 mg daily. Appears euvolemic,.  History of stroke with dysarthria and dysphagia. Appears to be chronic, patient has no history of aspiration pneumonia.   Frequent falls: Plan to set up home PT/OT if discharged tomorrow.   Essential hypertension. Continue Toprol 100 mg daily Continue Cardura 2 mg. Started on diltiazem 60 every 6 hours  Hyperlipidemia Crestor 10 mg daily    DVT prophylaxis: SCDs Code Status: DNR Family Communication: Spouse at bedside. Disposition Plan:   Status is: Inpatient Remains inpatient appropriate because:   Admitted for A-fib with RVR started on Cardizem drip.  Cardiology is consulted  Consultants:  Cardiology  Procedures: Echo  Antimicrobials: None   Subjective: Patient seen and examined at bedside.  Overnight events noted. Patient was agitated and restless due to telemetry monitor constantly beeping throughout the night he could not sleep well.  His heart rate is elevated.   Objective: Vitals:   05/02/22 0724 05/02/22 0912 05/02/22 1107 05/02/22 1241  BP: (!) 138/111 (!) 128/107 (!) 134/105 (!) 132/108  Pulse: (!) 123  (!) 132 (!) 55  Resp: (!) 22 16 18 17   Temp: 97.8 F (36.6 C)  97.7 F (36.5 C) (!) 97.5 F (36.4 C)  TempSrc: Oral  Oral Oral  SpO2: 94% 97% 96% 96%  Weight:      Height:        Intake/Output Summary (Last 24  hours) at 05/02/2022 1414 Last data filed at 05/02/2022 0739 Gross per 24 hour  Intake 243 ml  Output 965 ml  Net -722 ml   Filed Weights   04/30/22 2211 05/01/22 0618 05/02/22 0539  Weight: 83.5 kg 83.5 kg 83 kg    Examination:  General exam: Appears comfortable, deconditioned, agitated and restless. Respiratory system: Clear to auscultation. Respiratory  effort normal. RR 13 Cardiovascular system: S1 & S2 heard, Irregular rhythm, no murmur. Gastrointestinal system: Abdomen is soft, non tender, non distended, BS+ Central nervous system: Alert and oriented x 3. No focal neurological deficits. Extremities: No edema, no cyanosis, no clubbing Skin: No rashes, lesions or ulcers Psychiatry: Judgement and insight appear normal. Mood & affect appropriate.     Data Reviewed: I have personally reviewed following labs and imaging studies  CBC: Recent Labs  Lab 04/30/22 1000  WBC 6.6  HGB 12.8*  HCT 39.8  MCV 93.6  PLT 231   Basic Metabolic Panel: Recent Labs  Lab 04/30/22 1000 05/02/22 0836  NA 140 139  K 4.0 3.0*  CL 108 101  CO2 24 28  GLUCOSE 121* 100*  BUN 17 15  CREATININE 1.11 1.34*  CALCIUM 8.5* 8.6*   GFR: Estimated Creatinine Clearance: 39.8 mL/min (A) (by C-G formula based on SCr of 1.34 mg/dL (H)). Liver Function Tests: No results for input(s): "AST", "ALT", "ALKPHOS", "BILITOT", "PROT", "ALBUMIN" in the last 168 hours. No results for input(s): "LIPASE", "AMYLASE" in the last 168 hours. No results for input(s): "AMMONIA" in the last 168 hours. Coagulation Profile: No results for input(s): "INR", "PROTIME" in the last 168 hours. Cardiac Enzymes: No results for input(s): "CKTOTAL", "CKMB", "CKMBINDEX", "TROPONINI" in the last 168 hours. BNP (last 3 results) No results for input(s): "PROBNP" in the last 8760 hours. HbA1C: No results for input(s): "HGBA1C" in the last 72 hours. CBG: No results for input(s): "GLUCAP" in the last 168 hours. Lipid Profile: No results for input(s): "CHOL", "HDL", "LDLCALC", "TRIG", "CHOLHDL", "LDLDIRECT" in the last 72 hours. Thyroid Function Tests: No results for input(s): "TSH", "T4TOTAL", "FREET4", "T3FREE", "THYROIDAB" in the last 72 hours. Anemia Panel: No results for input(s): "VITAMINB12", "FOLATE", "FERRITIN", "TIBC", "IRON", "RETICCTPCT" in the last 72 hours. Sepsis Labs: No  results for input(s): "PROCALCITON", "LATICACIDVEN" in the last 168 hours.  No results found for this or any previous visit (from the past 240 hour(s)).   Radiology Studies: No results found.  Scheduled Meds:  aspirin EC  81 mg Oral Daily   diltiazem  60 mg Oral Q6H   donepezil  10 mg Oral Daily   doxazosin  2 mg Oral Daily   enoxaparin (LOVENOX) injection  40 mg Subcutaneous Q24H   latanoprost  1 drop Both Eyes QHS   metoprolol succinate  100 mg Oral Daily   pantoprazole  40 mg Oral Daily   potassium chloride  40 mEq Oral Q2H   rosuvastatin  10 mg Oral Daily   sertraline  50 mg Oral Daily   sodium chloride flush  3 mL Intravenous Q12H   tamsulosin  0.4 mg Oral Daily   torsemide  20 mg Oral Daily   Continuous Infusions:  sodium chloride       LOS: 1 day    Time spent: 50 mins    Willeen Niece, MD [facility]   If 7PM-7AM, please contact night-coverage

## 2022-05-02 NOTE — Evaluation (Signed)
Physical Therapy Evaluation Patient Details Name: Todd Golden. MRN: [id number] DOB: [birth year] Today's Date: 05/02/2022  History of Present Illness  Pt with PMH significant for chronic atrial fibrillation, not on anticoagulation due to recurrent falls, history of stroke with residual dysarthria, dysphagia, chronic systolic heart failure with last LVEF 40 to 45% on recent echocardiogram, benign prostatic hypertrophy,  hypertension, dyslipidemia presented in the ED with complaints of shortness of breath, workup showed CHF and afib with RVR.   Clinical Impression  Patient alert, agreeable to PT evaluation with encouragement, family at bedside. Pt is modI at home for ADLs, and uses RW for ambulation, does endorse 4 falls since the new year, as well as recent HHPT.  He was able to perform supine to sit with handheld assist from his wife. Sit <> stand with RW and supervision, did take him several attempts to successfully stand. Ambulated ~5ft in room with RW and CGA. Some unsteadiness noted but pt impulsive with navigating room and obstacles, no true LOB, HR in 110s-120s. Returned to bed with needs in reach. The patient demonstrated and reported near return to baseline level of functioning, no further acute PT needs indicated. PT to sign off. Please reconsult PT if pt status changes or acute needs are identified.       Recommendations for follow up therapy are one component of a multi-disciplinary discharge planning process, led by the attending physician.  Recommendations may be updated based on patient status, additional functional criteria and insurance authorization.  Follow Up Recommendations       Assistance Recommended at Discharge Intermittent Supervision/Assistance  Patient can return home with the following  A little help with walking and/or transfers;Assistance with cooking/housework;Direct supervision/assist for medications management;Assist for transportation;Help with stairs or  ramp for entrance;A little help with bathing/dressing/bathroom    Equipment Recommendations None recommended by PT  Recommendations for Other Services       Functional Status Assessment Patient has not had a recent decline in their functional status     Precautions / Restrictions Precautions Precautions: Fall Restrictions Weight Bearing Restrictions: No      Mobility  Bed Mobility Overal bed mobility: Needs Assistance Bed Mobility: Supine to Sit     Supine to sit: Min assist     General bed mobility comments: handheld assist from wife; usually uses a bed rail    Transfers Overall transfer level: Needs assistance Equipment used: Rolling walker (2 wheels) Transfers: Sit to/from Stand Sit to Stand: Supervision           General transfer comment: needed several attempts to successfully come up into standing    Ambulation/Gait Ambulation/Gait assistance: Min guard Gait Distance (Feet): 25 Feet Assistive device: Rolling walker (2 wheels)         General Gait Details: some unsteadiness noted but pt impulsive with navigating room and obstacles, no true LOB  Stairs            Wheelchair Mobility    Modified Rankin (Stroke Patients Only)       Balance Overall balance assessment: Needs assistance Sitting-balance support: Feet supported Sitting balance-Leahy Scale: Good     Standing balance support: Reliant on assistive device for balance Standing balance-Leahy Scale: Good                               Pertinent Vitals/Pain Pain Assessment Pain Assessment: No/denies pain    Home Living Family/patient expects to be discharged  to:: Private residence Living Arrangements: Spouse/significant other Available Help at Discharge: Family;Available 24 hours/day Type of Home: House Home Access: Stairs to enter   [employer] of Steps: threshold (1)   Home Layout: One level Home Equipment: Rollator (4 wheels);Rolling Walker (2  wheels)      Prior Function Prior Level of Function : History of Falls (last six months);Independent/Modified Independent             Mobility Comments: RW in bathroom, rollator majority of time. ADLs Comments: intermittent assist for LBD     Hand Dominance   Dominant Hand: Right    Extremity/Trunk Assessment   Upper Extremity Assessment Upper Extremity Assessment: Overall WFL for tasks assessed    Lower Extremity Assessment Lower Extremity Assessment: Overall WFL for tasks assessed    Cervical / Trunk Assessment Cervical / Trunk Assessment: Normal  Communication   Communication: Expressive difficulties  Cognition Arousal/Alertness: Awake/alert Behavior During Therapy: WFL for tasks assessed/performed Overall Cognitive Status: Within Functional Limits for tasks assessed                                          General Comments      Exercises     Assessment/Plan    PT Assessment Patient does not need any further PT services  PT Problem List         PT Treatment Interventions      PT Goals (Current goals can be found in the Care Plan section)       Frequency       Co-evaluation               AM-PAC PT "6 Clicks" Mobility  Outcome Measure Help needed turning from your back to your side while in a flat bed without using bedrails?: A Little Help needed moving from lying on your back to sitting on the side of a flat bed without using bedrails?: A Little Help needed moving to and from a bed to a chair (including a wheelchair)?: A Little Help needed standing up from a chair using your arms (e.g., wheelchair or bedside chair)?: A Little Help needed to walk in hospital room?: A Little Help needed climbing 3-5 steps with a railing? : A Little 6 Click Score: 18    End of Session   Activity Tolerance: Patient tolerated treatment well Patient left: in bed;with call bell/phone within reach;with family/visitor present Nurse  Communication: Mobility status PT Visit Diagnosis: Other abnormalities of gait and mobility (R26.89)    Time: 2023-3435 PT Time Calculation (min) (ACUTE ONLY): 9 min   Charges:   PT Evaluation $PT Eval Low Complexity: 1 Low          Olga Coaster PT, DPT 12:50 PM,05/02/22

## 2022-05-02 NOTE — Care Management Important Message (Signed)
Important Message  Patient Details  Name: Todd Golden. MRN: [id number] Date of Birth: 07/13/1930   Medicare Important Message Given:  N/A - LOS <3 / Initial given by admissions     [name] 05/02/2022, 2:36 PM

## 2022-05-02 NOTE — Progress Notes (Signed)
OT Cancellation Note  Patient Details Name: Todd Golden. MRN: [id number] DOB: 1930/09/05   Cancelled Treatment:    Reason Eval/Treat Not Completed: OT screened, no needs identified, will sign off. Per conversation with PT, pt is at baseline level of functional for self care and functional mobility. OT evaluation not indicated at this time.   Jackquline Denmark, MS, OTR/L , CBIS ascom [phone]  05/02/22, 12:35 PM

## 2022-05-02 NOTE — Progress Notes (Signed)
Patient admitted to the unit and placed on telemetry. A skin assessment was completed by myself and Shelbie Hutching. Scattered bruising noted but no other skin issues at this time.

## 2022-05-03 ENCOUNTER — Inpatient Hospital Stay (HOSPITAL_COMMUNITY)
Admit: 2022-05-03 | Discharge: 2022-05-03 | Disposition: A | Discharge status: Home / Self Care | Payer: PPO | Attending: Cardiovascular Disease | Admitting: Cardiovascular Disease

## 2022-05-03 ENCOUNTER — Other Ambulatory Visit: Payer: Self-pay | Admitting: Internal Medicine

## 2022-05-03 DIAGNOSIS — I4891 Unspecified atrial fibrillation: Secondary | ICD-10-CM | POA: Diagnosis not present

## 2022-05-03 DIAGNOSIS — J810 Acute pulmonary edema: Secondary | ICD-10-CM | POA: Diagnosis not present

## 2022-05-03 DIAGNOSIS — I69391 Dysphagia following cerebral infarction: Secondary | ICD-10-CM | POA: Diagnosis not present

## 2022-05-03 DIAGNOSIS — I25118 Atherosclerotic heart disease of native coronary artery with other forms of angina pectoris: Secondary | ICD-10-CM | POA: Diagnosis not present

## 2022-05-03 DIAGNOSIS — I482 Chronic atrial fibrillation, unspecified: Secondary | ICD-10-CM | POA: Diagnosis not present

## 2022-05-03 LAB — ECHOCARDIOGRAM LIMITED
Area-P 1/2: 4.19 cm2
Height: 71 in
S' Lateral: 4.2 cm
Weight: 2811.31 oz

## 2022-05-03 LAB — BASIC METABOLIC PANEL
Anion gap: 11 (ref 5–15)
BUN: 19 mg/dL (ref 8–23)
CO2: 28 mmol/L (ref 22–32)
Calcium: 9 mg/dL (ref 8.9–10.3)
Chloride: 103 mmol/L (ref 98–111)
Creatinine, Ser: 1.33 mg/dL — ABNORMAL HIGH (ref 0.61–1.24)
GFR, Estimated: 51 mL/min — ABNORMAL LOW (ref >60)
Glucose, Bld: 104 mg/dL — ABNORMAL HIGH (ref 70–99)
Potassium: 3.7 mmol/L (ref 3.5–5.1)
Sodium: 142 mmol/L (ref 135–145)

## 2022-05-03 MED ORDER — DOXAZOSIN MESYLATE 2 MG PO TABS: 2.0000 mg | ORAL_TABLET | Freq: Every day | ORAL | 1 refills | Status: DC

## 2022-05-03 MED ORDER — AMIODARONE HCL 200 MG PO TABS: 200.0000 mg | ORAL_TABLET | Freq: Two times a day (BID) | ORAL | 1 refills | Status: DC

## 2022-05-03 MED ORDER — ROSUVASTATIN CALCIUM 10 MG PO TABS: 10.0000 mg | ORAL_TABLET | Freq: Every day | ORAL | 1 refills | Status: DC

## 2022-05-03 MED ORDER — POTASSIUM CHLORIDE 20 MEQ PO PACK
40.0000 meq | PACK | Freq: Once | ORAL | Status: AC
  Administered 2022-05-03: 40 meq via ORAL
  Filled 2022-05-03: qty 2

## 2022-05-03 MED ORDER — HYDRALAZINE HCL 25 MG PO TABS
25.0000 mg | ORAL_TABLET | Freq: Three times a day (TID) | ORAL | Status: DC
  Administered 2022-05-03 (×2): 25 mg via ORAL
  Filled 2022-05-03 (×2): qty 1

## 2022-05-03 MED ORDER — HYDRALAZINE HCL 25 MG PO TABS: 25.0000 mg | ORAL_TABLET | Freq: Three times a day (TID) | ORAL | 0 refills | Status: DC

## 2022-05-03 MED ORDER — POTASSIUM CHLORIDE 20 MEQ PO PACK: 40.0000 meq | PACK | Freq: Once | ORAL | Status: DC

## 2022-05-03 MED ORDER — TORSEMIDE 20 MG PO TABS: 20.0000 mg | ORAL_TABLET | Freq: Every day | ORAL | 1 refills | Status: DC

## 2022-05-03 MED ORDER — METOPROLOL SUCCINATE ER 100 MG PO TB24: 100.0000 mg | ORAL_TABLET | Freq: Every day | ORAL | 1 refills | Status: DC

## 2022-05-03 NOTE — Discharge Summary (Signed)
Physician Discharge Summary  Todd Golden. ZOX:[id number] DOB: Jul 13, 1930 DOA: 04/30/2022  PCP: Sherlene Shams, MD  Admit date: 04/30/2022  Discharge date: 05/03/2022  Admitted From: Home.  Disposition:  Home  Recommendations for Outpatient Follow-up:  Follow up with PCP in 1-2 weeks. Please obtain BMP/CBC in one week. Advised to follow-up with Cardiology as scheduled. Advised to take Hydralazine 25 mg 3 times daily Advised to take metoprolol 100 mg daily for heart rate control.  Home Health: None Equipment/Devices:None  Discharge Condition: Stable CODE STATUS:Full code Diet recommendation: Heart Healthy  Brief [facility] Course: This 89 years old male with PMH significant for chronic atrial fibrillation, not on anticoagulation due to recurrent falls, history of stroke with residual dysarthria, dysphagia, chronic systolic heart failure with last LVEF 40 to 45% on recent echocardiogram, benign prostatic hypertrophy, hypertension, dyslipidemia presented in the ED with complaints of shortness of breath. Patient reports that he woke up in the morning with shortness of breath and having productive cough. He also reports some dysphagia but never had any aspiration pneumonia in the past. Upon arrival in the ED he is found to have BNP 588, EKG shows A-fib with RVR heart rate in 140s. Patient started on diltiazem drip and admitted for further evaluation. Cardiology was consulted. HR improved.  Patient was switched to metoprolol and been tolerating well.  He was also given two dose of digoxin.  Heart rate remains reasonably controlled.  He is started on amiodarone 200 mg daily.  Patient remains asymptomatic and wants to be discharged.  Patient is being discharged home.  Discharge Diagnoses:  Principal Problem:   Chronic atrial fibrillation with rapid ventricular response Active Problems:   Frequent falls   History of CVA with residual deficit   Acute on chronic systolic CHF  (congestive heart failure)   Dysphagia as late effect of cerebrovascular accident (CVA)   Chronic atrial fibrillation with RVR   Atrial fibrillation with RVR   Acute pulmonary edema   Non-ST elevation (NSTEMI) myocardial infarction   Coronary artery disease of native artery of native heart with stable angina pectoris  Chronic atrial fibrillation with RVR: Patient presented with shortness of breath and tachycardia. EKG showed A-fib with RVR.  Started on Cardizem drip.   Added metoprolol 75 mg for heart rate control. Cardiology is consulted.  Patient is not on anticoagulation due to recurrent falls. Recent echo shows LVEF 40 to 45%, Repeat Echo showed LVEF 35 to 40%. IV Cardizem discontinued.  Toprol uptitrated to 100 mg today. Heart rate is up today as patient was agitated and restless due to telemetry near by constantly bothering him. He is started on amiodarone 200 mg twice daily. Heart rate is now controlled between 90 -100s.   Acute on Chronic diastolic congestive heart failure: Patient appears to have a mild volume overload, reviewed echocardiogram performed earlier this year, showed ejection fraction 40 to 45%.   Patient has received IV Lasix, transitioned to torsemide. Continue torsemide 20 mg daily. Appears euvolemic,. Repeat echo shows LVEF 35 to 40%.   History of stroke with dysarthria and dysphagia. Appears to be chronic, patient has no history of aspiration pneumonia.   Frequent falls: Home and services arranged. Essential hypertension. Continue Toprol 100 mg daily Continue Cardura 2 mg. Started on diltiazem 60 every 6 hours.    Hyperlipidemia Crestor 10 mg daily  Discharge Instructions  Discharge Instructions     Call MD for:  difficulty breathing, headache or visual disturbances   Complete by:  As directed    Call MD for:  persistant dizziness or light-headedness   Complete by: As directed    Call MD for:  persistant nausea and vomiting   Complete by: As  directed    Diet - low sodium heart healthy   Complete by: As directed    Diet Carb Modified   Complete by: As directed    Discharge instructions   Complete by: As directed    Advised to follow-up with primary care physician in 1 week. Advised to follow-up with cardiology as scheduled. Advised to take hydralazine 25 mg 3 times daily Advised to take metoprolol 100 mg daily for heart rate control.   Increase activity slowly   Complete by: As directed       Allergies as of 05/03/2022   No Known Allergies      Medication List     STOP taking these medications    amLODipine 5 MG tablet Commonly known as: NORVASC   hydrochlorothiazide 25 MG tablet Commonly known as: HYDRODIURIL   pindolol 10 MG tablet Commonly known as: VISKEN   simvastatin 20 MG tablet Commonly known as: ZOCOR       TAKE these medications    acetaminophen 325 MG tablet Commonly known as: TYLENOL Take 2 tablets (650 mg total) by mouth every 6 (six) hours as needed for mild pain (or Fever >/= 101).   amiodarone 200 MG tablet Commonly known as: PACERONE Take 1 tablet (200 mg total) by mouth 2 (two) times daily.   aspirin EC 81 MG tablet Take 81 mg by mouth daily. Swallow whole.   cholecalciferol 25 MCG (1000 UNIT) tablet Commonly known as: VITAMIN D3 Take 1 tablet (1,000 Units total) by mouth daily.   donepezil 10 MG tablet Commonly known as: ARICEPT Take 1 tablet (10 mg total) by mouth daily.   doxazosin 2 MG tablet Commonly known as: CARDURA Take 1 tablet (2 mg total) by mouth daily. Start taking on: May 04, 2022 What changed:  medication strength how much to take   hydrALAZINE 25 MG tablet Commonly known as: APRESOLINE Take 1 tablet (25 mg total) by mouth every 8 (eight) hours.   l-methylfolate-B6-B12 3-35-2 MG Tabs tablet Commonly known as: METANX Take 1 tablet by mouth daily.   latanoprost 0.005 % ophthalmic solution Commonly known as: XALATAN Place 1 drop into both eyes  at bedtime.   magnesium gluconate 500 MG tablet Commonly known as: MAGONATE Take 0.5 tablets (250 mg total) by mouth at bedtime.   metoprolol succinate 100 MG 24 hr tablet Commonly known as: TOPROL-XL Take 1 tablet (100 mg total) by mouth daily. Take with or immediately following a meal. Start taking on: May 04, 2022   omeprazole 40 MG capsule Commonly known as: PRILOSEC Take by mouth 1 capsule daily   PRESERVISION AREDS 2 PO Take by mouth daily.   rosuvastatin 10 MG tablet Commonly known as: CRESTOR Take 1 tablet (10 mg total) by mouth daily. Start taking on: May 04, 2022 What changed:  medication strength See the new instructions.   sertraline 50 MG tablet Commonly known as: ZOLOFT Take 1 tablet (50 mg total) by mouth daily.   tamsulosin 0.4 MG Caps capsule Commonly known as: FLOMAX TAKE 2 CAPSULES(0.8 MG) BY MOUTH DAILY AFTER SUPPER   torsemide 20 MG tablet Commonly known as: DEMADEX Take 1 tablet (20 mg total) by mouth daily. Start taking on: May 04, 2022        Follow-up Information  Antonieta Iba, MD Follow up in 1 week(s).   Specialty: Cardiology Contact information: [address] [place] [address] [phone]         Sherlene Shams, MD Follow up in 1 week(s).   Specialty: Internal Medicine Contact information: [address] [place] [address] [phone]                No Known Allergies  Consultations: Cardiology   Procedures/Studies: ECHOCARDIOGRAM LIMITED  Result Date: 05/03/2022    ECHOCARDIOGRAM LIMITED REPORT   Patient Name:   Todd Golden. Date of Exam: 05/03/2022 Medical Rec #:  [id number]            Height:       71.0 in Accession #:    [id number]           Weight:       175.7 lb Date of Birth:  1930-09-07            BSA:          1.996 m Patient Age:    89 years             BP:           120/92 mmHg Patient Gender: M                    HR:           92 bpm. Exam  Location:  [facility] Procedure: Limited Echo and Limited Color Doppler Indications:     Atrial Fibrillation  History:         Patient has no prior history of Echocardiogram examinations.                  CHF, CAD and Acute MI, Stroke, Arrythmias:Atrial Fibrillation,                  Signs/Symptoms:Altered Mental Status; Risk Factors:Hypertension                  and Dyslipidemia.  Sonographer:     Mikki Harbor Referring Phys:  [id number] Antonieta Iba Diagnosing Phys: Julien Nordmann MD IMPRESSIONS  1. Left ventricular ejection fraction, by estimation, is 30 to 35%. Left ventricular ejection fraction by PLAX is 27 %. The left ventricle has moderately decreased function. The left ventricle demonstrates global hypokinesis. Left ventricular diastolic parameters are indeterminate.  2. Right ventricular systolic function is normal. The right ventricular size is normal. Tricuspid regurgitation signal is inadequate for assessing PA pressure.  3. Left atrial size was mild to moderately dilated.  4. The mitral valve is normal in structure. Mild mitral valve regurgitation. No evidence of mitral stenosis.  5. The aortic valve is normal in structure. There is mild calcification of the aortic valve. Aortic valve regurgitation is not visualized. Aortic valve sclerosis/calcification is present, without any evidence of aortic stenosis.  6. There is borderline dilatation of the aortic root, measuring 39 mm.  7. The inferior vena cava is normal in size with greater than 50% respiratory variability, suggesting right atrial pressure of 3 mmHg.  8. Rhythm is atrial fibrillation FINDINGS  Left Ventricle: Left ventricular ejection fraction, by estimation, is 30 to 35%. Left ventricular ejection fraction by PLAX is 27 %. The left ventricle has moderately decreased function. The left ventricle demonstrates global hypokinesis. The left ventricular internal cavity size was normal in size. There is no left ventricular hypertrophy. Left ventricular  diastolic  parameters are indeterminate. Right Ventricle: The right ventricular size is normal. No increase in right ventricular wall thickness. Right ventricular systolic function is normal. Tricuspid regurgitation signal is inadequate for assessing PA pressure. Left Atrium: Left atrial size was mild to moderately dilated. Right Atrium: Right atrial size was normal in size. Pericardium: There is no evidence of pericardial effusion. Mitral Valve: The mitral valve is normal in structure. There is mild calcification of the mitral valve leaflet(s). Mild mitral valve regurgitation. No evidence of mitral valve stenosis. MV peak gradient, 3.6 mmHg. The mean mitral valve gradient is 1.0 mmHg. Tricuspid Valve: The tricuspid valve is normal in structure. Tricuspid valve regurgitation is not demonstrated. No evidence of tricuspid stenosis. Aortic Valve: The aortic valve is normal in structure. There is mild calcification of the aortic valve. Aortic valve regurgitation is not visualized. Aortic valve sclerosis/calcification is present, without any evidence of aortic stenosis. Pulmonic Valve: The pulmonic valve was normal in structure. Pulmonic valve regurgitation is not visualized. No evidence of pulmonic stenosis. Aorta: The aortic root is normal in size and structure. There is borderline dilatation of the aortic root, measuring 39 mm. Venous: The inferior vena cava is normal in size with greater than 50% respiratory variability, suggesting right atrial pressure of 3 mmHg. IAS/Shunts: No atrial level shunt detected by color flow Doppler. LEFT VENTRICLE PLAX 2D LV EF:         Left ventricular ejection fraction by PLAX is 27 %. LVIDd:         4.80 cm LVIDs:         4.20 cm LV PW:         1.35 cm LV IVS:        1.25 cm LVOT diam:     2.00 cm LVOT Area:     3.14 cm  LEFT ATRIUM             Index        RIGHT ATRIUM           Index LA diam:        4.10 cm 2.05 cm/m   RA Area:     17.90 cm LA Vol (A2C):   76.3 ml 38.23 ml/m  RA  Volume:   51.60 ml  25.86 ml/m LA Vol (A4C):   54.6 ml 27.36 ml/m LA Biplane Vol: 65.2 ml 32.67 ml/m   AORTA Ao Root diam: 3.90 cm MITRAL VALVE MV Area (PHT): 4.19 cm    SHUNTS MV Peak grad:  3.6 mmHg    Systemic Diam: 2.00 cm MV Mean grad:  1.0 mmHg MV Vmax:       0.95 m/s MV Vmean:      54.3 cm/s MV Decel Time: 181 msec MV E velocity: 71.30 cm/s Julien Nordmann MD Electronically signed by Julien Nordmann MD Signature Date/Time: 05/03/2022/1:26:25 PM    Final    DG Chest 2 View  Result Date: 04/30/2022 CLINICAL DATA:  Shortness of breath EXAM: CHEST - 2 VIEW COMPARISON:  07/12/2021 FINDINGS: Transverse diameter of heart is increased. Central pulmonary vessels are prominent. There is increase in interstitial markings in the parahilar regions and lower lung fields. There is minimal blunting of lateral CP angles. There is no pneumothorax. Severe degenerative changes are noted in both shoulders. IMPRESSION: Cardiomegaly. Central pulmonary vessels are more prominent. There is increase in interstitial markings in the parahilar regions and lower lung fields suggesting possible mild interstitial pulmonary edema. Small bilateral pleural effusions. Electronically Signed   By: Rhae Hammock  Rathinasamy M.D.   On: 04/30/2022 11:04     Subjective: Patient was seen and examined at bedside.  Overnight events noted.   Patient reports doing much better and wants to be discharged.  Patient is being discharged home. HR remains well controlled.  Discharge Exam: Vitals:   05/03/22 0900 05/03/22 1402  BP: (!) 153/115 116/80  Pulse: 90 (!) 128  Resp: 18 19  Temp:    SpO2: 98% 97%   Vitals:   05/03/22 0400 05/03/22 0855 05/03/22 0900 05/03/22 1402  BP: (!) 120/92 (!) 157/108 (!) 153/115 116/80  Pulse: 89 (!) 57 90 (!) 128  Resp: Temp: 98 F (36.7 C) 97.7 F (36.5 C)    TempSrc: Oral Oral Oral   SpO2: 99% 98% 98% 97%  Weight:      Height:        General: Pt is alert, awake, not in acute  distress Cardiovascular: RRR, S1/S2 +, no rubs, no gallops Respiratory: CTA bilaterally, no wheezing, no rhonchi Abdominal: Soft, NT, ND, bowel sounds + Extremities: no edema, no cyanosis    The results of significant diagnostics from this hospitalization (including imaging, microbiology, ancillary and laboratory) are listed below for reference.     Microbiology: No results found for this or any previous visit (from the past 240 hour(s)).   Labs: BNP (last 3 results) Recent Labs    04/30/22 0959  BNP 588.3*   Basic Metabolic Panel: Recent Labs  Lab 04/30/22 1000 05/02/22 0836 05/03/22 0941  NA 140 139 142  K 4.0 3.0* 3.7  CL 108 101 103  CO2 GLUCOSE 121* 100* 104*  BUN CREATININE 1.11 1.34* 1.33*  CALCIUM 8.5* 8.6* 9.0   Liver Function Tests: No results for input(s): "AST", "ALT", "ALKPHOS", "BILITOT", "PROT", "ALBUMIN" in the last 168 hours. No results for input(s): "LIPASE", "AMYLASE" in the last 168 hours. No results for input(s): "AMMONIA" in the last 168 hours. CBC: Recent Labs  Lab 04/30/22 1000  WBC 6.6  HGB 12.8*  HCT 39.8  MCV 93.6  PLT 231   Cardiac Enzymes: No results for input(s): "CKTOTAL", "CKMB", "CKMBINDEX", "TROPONINI" in the last 168 hours. BNP: Invalid input(s): "POCBNP" CBG: No results for input(s): "GLUCAP" in the last 168 hours. D-Dimer No results for input(s): "DDIMER" in the last 72 hours. Hgb A1c No results for input(s): "HGBA1C" in the last 72 hours. Lipid Profile No results for input(s): "CHOL", "HDL", "LDLCALC", "TRIG", "CHOLHDL", "LDLDIRECT" in the last 72 hours. Thyroid function studies No results for input(s): "TSH", "T4TOTAL", "T3FREE", "THYROIDAB" in the last 72 hours.  Invalid input(s): "FREET3" Anemia work up No results for input(s): "VITAMINB12", "FOLATE", "FERRITIN", "TIBC", "IRON", "RETICCTPCT" in the last 72 hours. Urinalysis    Component Value Date/Time   COLORURINE YELLOW 03/24/2022  0904   APPEARANCEUR CLEAR 03/24/2022 0904   APPEARANCEUR CLEAR 12/03/2013 1015   LABSPEC 1.009 03/24/2022 0904   LABSPEC 1.005 12/03/2013 1015   PHURINE 6.0 03/24/2022 0904   GLUCOSEU NEGATIVE 03/24/2022 0904   GLUCOSEU NEGATIVE 08/02/2020 1208   HGBUR NEGATIVE 03/24/2022 0904   BILIRUBINUR NEGATIVE 03/24/2022 0904   BILIRUBINUR NEGATIVE 12/03/2013 1015   KETONESUR NEGATIVE 03/24/2022 0904   PROTEINUR NEGATIVE 03/24/2022 0904   UROBILINOGEN 0.2 08/02/2020 1208   NITRITE NEGATIVE 03/24/2022 0904   LEUKOCYTESUR NEGATIVE 03/24/2022 0904   LEUKOCYTESUR NEGATIVE 12/03/2013 1015   Sepsis Labs Recent Labs  Lab 04/30/22 1000  WBC 6.6  Microbiology No results found for this or any previous visit (from the past 240 hour(s)).   Time coordinating discharge: Over 30 minutes  SIGNED:   Willeen Niece, MD  [facility] 05/03/2022, 3:51 PM Pager   If 7PM-7AM, please contact night-coverage

## 2022-05-03 NOTE — Progress Notes (Signed)
*  PRELIMINARY RESULTS* Echocardiogram A Limitted 2D Echocardiogram has been performed.  Carolyne Fiscal 05/03/2022, 10:54 AM

## 2022-05-03 NOTE — Progress Notes (Signed)
Rounding Note    Patient Name: Todd Golden. Date of Encounter: 05/03/2022  [facility] Cardiologist: Lorine Bears, MD   Subjective   Wife at the bedside Less agitated this morning, had a good night sleep He has difficulty communicating his thoughts concerning his medications Wife having difficulty understanding him Tolerating metoprolol succinate 100 Given dose of diltiazem 60 with no improvement in rate Received digoxin 0.25 milligrams IV push x 2 Started on amiodarone 200 twice daily Rate improved into the 90s to 100 Reports he is asymptomatic he would like to go home  Echocardiogram with EF 30 to 35% down from 40 to 45%  Inpatient Medications    Scheduled Meds:  amiodarone  200 mg Oral BID   aspirin EC  81 mg Oral Daily   donepezil  10 mg Oral Daily   doxazosin  2 mg Oral Daily   enoxaparin (LOVENOX) injection  40 mg Subcutaneous Q24H   hydrALAZINE  25 mg Oral Q8H   latanoprost  1 drop Both Eyes QHS   metoprolol succinate  100 mg Oral Daily   pantoprazole  40 mg Oral Daily   rosuvastatin  10 mg Oral Daily   sertraline  50 mg Oral Daily   sodium chloride flush  3 mL Intravenous Q12H   tamsulosin  0.4 mg Oral Daily   torsemide  20 mg Oral Daily   Continuous Infusions:  sodium chloride     PRN Meds: sodium chloride, acetaminophen, acetaminophen, magnesium hydroxide, ondansetron (ZOFRAN) IV, sodium chloride flush   Vital Signs    Vitals:   05/03/22 0111 05/03/22 0400 05/03/22 0855 05/03/22 0900  BP:  (!) 120/92 (!) 157/108 (!) 153/115  Pulse:  89 (!) 57 90  Resp:  Temp:  98 F (36.7 C) 97.7 F (36.5 C)   TempSrc:  Oral Oral Oral  SpO2:  99% 98% 98%  Weight: 79.7 kg     Height:        Intake/Output Summary (Last 24 hours) at 05/03/2022 1328 Last data filed at 05/03/2022 1000 Gross per 24 hour  Intake 600 ml  Output --  Net 600 ml      05/03/2022    1:11 AM 05/02/2022    5:39 AM 05/01/2022    6:18 AM  Last 3 Weights   Weight (lbs) 175 lb 11.3 oz 183 lb 184 lb 1.4 oz  Weight (kg) 79.7 kg 83.008 kg 83.5 kg      Telemetry    Atrial fibrillation rate 90-100 for the past 24 hours- Personally Reviewed  ECG     - Personally Reviewed  Physical Exam   Constitutional:  oriented to person, place, and time. No distress.  HENT:  Head: Grossly normal Eyes:  no discharge. No scleral icterus.  Neck: No JVD, no carotid bruits  Cardiovascular: Irregularly irregular,  no murmurs appreciated Pulmonary/Chest: Clear to auscultation bilaterally, no wheezes or rails Abdominal: Soft.  no distension.  no tenderness.  Musculoskeletal: Normal range of motion Neurological:  normal muscle tone. Coordination normal. No atrophy Skin: Skin warm and dry Psychiatric: normal affect, pleasant   Labs    High Sensitivity Troponin:   Recent Labs  Lab 04/30/22 0959  TROPONINIHS 17     Chemistry Recent Labs  Lab 04/30/22 1000 05/02/22 0836 05/03/22 0941  NA 140 139 142  K 4.0 3.0* 3.7  CL 108 101 103  CO2 GLUCOSE 121* 100* 104*  BUN 17 15  19  CREATININE 1.11 1.34* 1.33*  CALCIUM 8.5* 8.6* 9.0  GFRNONAA >60 51* 51*  ANIONGAP 8 10 11     Lipids No results for input(s): "CHOL", "TRIG", "HDL", "LABVLDL", "LDLCALC", "CHOLHDL" in the last 168 hours.  Hematology Recent Labs  Lab 04/30/22 1000  WBC 6.6  RBC 4.25  HGB 12.8*  HCT 39.8  MCV 93.6  MCH 30.1  MCHC 32.2  RDW 13.6  PLT 231   Thyroid No results for input(s): "TSH", "FREET4" in the last 168 hours.  BNP Recent Labs  Lab 04/30/22 0959  BNP 588.3*    DDimer No results for input(s): "DDIMER" in the last 168 hours.   Radiology    ECHOCARDIOGRAM LIMITED  Result Date: 05/03/2022    ECHOCARDIOGRAM LIMITED REPORT   Patient Name:   Todd Golden. Date of Exam: 05/03/2022 Medical Rec #:  [id number]            Height:       71.0 in Accession #:    [id number]           Weight:       175.7 lb Date of Birth:  Mar 10, 1930            BSA:           1.996 m Patient Age:    89 years             BP:           120/92 mmHg Patient Gender: M                    HR:           92 bpm. Exam Location:  [facility] Procedure: Limited Echo and Limited Color Doppler Indications:     Atrial Fibrillation  History:         Patient has no prior history of Echocardiogram examinations.                  CHF, CAD and Acute MI, Stroke, Arrythmias:Atrial Fibrillation,                  Signs/Symptoms:Altered Mental Status; Risk Factors:Hypertension                  and Dyslipidemia.  Sonographer:     Mikki Harbor Referring Phys:  [id number] Antonieta Iba Diagnosing Phys: Julien Nordmann MD IMPRESSIONS  1. Left ventricular ejection fraction, by estimation, is 30 to 35%. Left ventricular ejection fraction by PLAX is 27 %. The left ventricle has moderately decreased function. The left ventricle demonstrates global hypokinesis. Left ventricular diastolic parameters are indeterminate.  2. Right ventricular systolic function is normal. The right ventricular size is normal. Tricuspid regurgitation signal is inadequate for assessing PA pressure.  3. Left atrial size was mild to moderately dilated.  4. The mitral valve is normal in structure. Mild mitral valve regurgitation. No evidence of mitral stenosis.  5. The aortic valve is normal in structure. There is mild calcification of the aortic valve. Aortic valve regurgitation is not visualized. Aortic valve sclerosis/calcification is present, without any evidence of aortic stenosis.  6. There is borderline dilatation of the aortic root, measuring 39 mm.  7. The inferior vena cava is normal in size with greater than 50% respiratory variability, suggesting right atrial pressure of 3 mmHg.  8. Rhythm is atrial fibrillation FINDINGS  Left Ventricle: Left ventricular ejection fraction, by estimation, is 30 to 35%. Left ventricular ejection fraction  by PLAX is 27 %. The left ventricle has moderately decreased function. The left ventricle demonstrates  global hypokinesis. The left ventricular internal cavity size was normal in size. There is no left ventricular hypertrophy. Left ventricular diastolic parameters are indeterminate. Right Ventricle: The right ventricular size is normal. No increase in right ventricular wall thickness. Right ventricular systolic function is normal. Tricuspid regurgitation signal is inadequate for assessing PA pressure. Left Atrium: Left atrial size was mild to moderately dilated. Right Atrium: Right atrial size was normal in size. Pericardium: There is no evidence of pericardial effusion. Mitral Valve: The mitral valve is normal in structure. There is mild calcification of the mitral valve leaflet(s). Mild mitral valve regurgitation. No evidence of mitral valve stenosis. MV peak gradient, 3.6 mmHg. The mean mitral valve gradient is 1.0 mmHg. Tricuspid Valve: The tricuspid valve is normal in structure. Tricuspid valve regurgitation is not demonstrated. No evidence of tricuspid stenosis. Aortic Valve: The aortic valve is normal in structure. There is mild calcification of the aortic valve. Aortic valve regurgitation is not visualized. Aortic valve sclerosis/calcification is present, without any evidence of aortic stenosis. Pulmonic Valve: The pulmonic valve was normal in structure. Pulmonic valve regurgitation is not visualized. No evidence of pulmonic stenosis. Aorta: The aortic root is normal in size and structure. There is borderline dilatation of the aortic root, measuring 39 mm. Venous: The inferior vena cava is normal in size with greater than 50% respiratory variability, suggesting right atrial pressure of 3 mmHg. IAS/Shunts: No atrial level shunt detected by color flow Doppler. LEFT VENTRICLE PLAX 2D LV EF:         Left ventricular ejection fraction by PLAX is 27 %. LVIDd:         4.80 cm LVIDs:         4.20 cm LV PW:         1.35 cm LV IVS:        1.25 cm LVOT diam:     2.00 cm LVOT Area:     3.14 cm  LEFT ATRIUM              Index        RIGHT ATRIUM           Index LA diam:        4.10 cm 2.05 cm/m   RA Area:     17.90 cm LA Vol (A2C):   76.3 ml 38.23 ml/m  RA Volume:   51.60 ml  25.86 ml/m LA Vol (A4C):   54.6 ml 27.36 ml/m LA Biplane Vol: 65.2 ml 32.67 ml/m   AORTA Ao Root diam: 3.90 cm MITRAL VALVE MV Area (PHT): 4.19 cm    SHUNTS MV Peak grad:  3.6 mmHg    Systemic Diam: 2.00 cm MV Mean grad:  1.0 mmHg MV Vmax:       0.95 m/s MV Vmean:      54.3 cm/s MV Decel Time: 181 msec MV E velocity: 71.30 cm/s Julien Nordmann MD Electronically signed by Julien Nordmann MD Signature Date/Time: 05/03/2022/1:26:25 PM    Final     Cardiac Studies     Patient Profile     Todd Golden. is a 89 y.o. male with a hx of permanent Afib not on a/c due to falls, h/o stroke, HTN, HLD, dementia, glaucoma who is being seen 05/01/2022 for the evaluation of rapid Atrial fibrillation   Assessment & Plan    atrial fibrillation with RVR n pindolol 10 mg  as outpatient, presenting with poor rate control, CHF symptoms Initially treated with diltiazem infusion, transitioned to metoprolol succinate  Tolerating metoprolol succinate 100 daily For better rate control received digoxin 0.25 IV x 2 No much improvement with diltiazem 60 x 1 Started on amiodarone 200 twice daily Rate this morning 90-100 Currently not on anticoagulation given high fall risk May not be a candidate for Watchman device given age 41 -Ejection fraction 30 to 35% in the setting of tachycardia down from 40 to 45%   Acute diastolic and systolic CHF Drop in ejection fraction felt most likely secondary to tachycardia mediated pathology Continue metoprolol succinate, torsemide 20 daily HCTZ on hold    Essential hypertension Cardura down to 2 mg given  Metoprolol succinate  100 milligrams daily HCTZ on hold Not on diltiazem given cardiomyopathy  Agitation Agitated better after good night sleep   Total encounter time more than 50 minutes  Greater than 50%  was spent in counseling and coordination of care with the patient     For questions or updates, please contact [facility] Please consult [email or web address] for contact info under        Signed, Julien Nordmann, MD  05/03/2022, 1:28 PM

## 2022-05-06 ENCOUNTER — Telehealth: Payer: Self-pay | Admitting: *Deleted

## 2022-05-06 ENCOUNTER — Ambulatory Visit: Payer: Self-pay

## 2022-05-06 NOTE — Chronic Care Management (AMB) (Signed)
   05/06/2022  Django Anthis. 09-04-1930 [id number]   Reason for Encounter: Patient is not currently enrolled in the CCM program. CCM status changed to previously enrolled.   Katha Cabal RN Care Manager/Chronic Care Management [phone]

## 2022-05-06 NOTE — Transitions of Care (Post Inpatient/ED Visit) (Signed)
   05/06/2022  Name: Todd Golden. MRN: [id number] DOB: 1930/05/12  Today's TOC FU Call Status: Today's TOC FU Call Status:: Unsuccessul Call (1st Attempt) Unsuccessful Call (1st Attempt) Date: 05/06/22  Attempted to reach the patient regarding the most recent Inpatient/ED visit.  Follow Up Plan: Additional outreach attempts will be made to reach the patient to complete the Transitions of Care (Post Inpatient/ED visit) call.   Gean Maidens BSN RN [facility] [phone]

## 2022-05-07 ENCOUNTER — Telehealth: Payer: Self-pay | Admitting: *Deleted

## 2022-05-07 DIAGNOSIS — J301 Allergic rhinitis due to pollen: Secondary | ICD-10-CM | POA: Diagnosis not present

## 2022-05-07 DIAGNOSIS — J3089 Other allergic rhinitis: Secondary | ICD-10-CM | POA: Diagnosis not present

## 2022-05-07 DIAGNOSIS — J3081 Allergic rhinitis due to animal (cat) (dog) hair and dander: Secondary | ICD-10-CM | POA: Diagnosis not present

## 2022-05-07 NOTE — Progress Notes (Unsigned)
   Patient ID: Todd Golden., male    DOB: Apr 05, 1930, 89 y.o.   MRN: [id number]  Primary cardiologist: Lorine Bears, MD (last seen 01/24) PCP: Sherlene Shams, MD (last seen 03/24; returns 05/24)  HPI  Todd Golden is a 89 y/o male with a history of  Echo 05/03/22: EF 30-35% along with mild Todd with atrial fibrillation.   Admitted 04/30/22 due to SOB/ cough and found to be in AF RVR. Diltiazem gtt started & 2 doses of digoxin given along with starting on amiodarone. Admitted 03/19/22 due to needing inpatient rehab. Discharged after 10 days. Admitted 03/15/22 due to acute falls with dragging of his right leg. Expressive aphasia present from previous stroke. MRI confirmed stroke.  No large vessel occlusion on CT angio. Transferred to inpatient rehab.   He presents today for his initial HF visit with a chief complaint of   Review of Systems    Physical Exam      Assessment & Plan:  1: NICM with reduced ejection fraction- - NYHA class - Echo 05/03/22: EF 30-35% along with mild Todd with atrial fibrillation  - BNP 04/30/22 was 588.3  2: HTN- - BP - saw PCP Darrick Huntsman) 03/24 - BMP 05/03/22 showed sodium 142, potassium 3.7, creatinine 1.33 & GFR 51  3: Atrial fibrillation- - saw cardiology Kirke Corin) 01/24 - not on NOAC due to frequent falls  4: Stroke- - has residual dysphagia

## 2022-05-07 NOTE — Transitions of Care (Post Inpatient/ED Visit) (Signed)
   05/07/2022  Name: Todd Golden. MRN: [id number] DOB: 04/16/1930  Today's TOC FU Call Status: Today's TOC FU Call Status:: Unsuccessful Call (2nd Attempt) Unsuccessful Call (2nd Attempt) Date: 05/07/22  Attempted to reach the patient regarding the most recent Inpatient/ED visit.  Follow Up Plan: Additional outreach attempts will be made to reach the patient to complete the Transitions of Care (Post Inpatient/ED visit) call.   Gean Maidens BSN RN [facility] [phone]

## 2022-05-08 ENCOUNTER — Telehealth: Payer: Self-pay | Admitting: *Deleted

## 2022-05-08 ENCOUNTER — Ambulatory Visit: Payer: PPO | Attending: Family | Admitting: Family

## 2022-05-08 ENCOUNTER — Encounter: Payer: Self-pay | Admitting: Family

## 2022-05-08 VITALS — BP 99/70 | HR 60 | Wt 176.2 lb

## 2022-05-08 DIAGNOSIS — I130 Hypertensive heart and chronic kidney disease with heart failure and stage 1 through stage 4 chronic kidney disease, or unspecified chronic kidney disease: Secondary | ICD-10-CM | POA: Diagnosis not present

## 2022-05-08 DIAGNOSIS — R1310 Dysphagia, unspecified: Secondary | ICD-10-CM | POA: Insufficient documentation

## 2022-05-08 DIAGNOSIS — H3530 Unspecified macular degeneration: Secondary | ICD-10-CM | POA: Diagnosis not present

## 2022-05-08 DIAGNOSIS — N189 Chronic kidney disease, unspecified: Secondary | ICD-10-CM | POA: Diagnosis not present

## 2022-05-08 DIAGNOSIS — E785 Hyperlipidemia, unspecified: Secondary | ICD-10-CM | POA: Insufficient documentation

## 2022-05-08 DIAGNOSIS — I4821 Permanent atrial fibrillation: Secondary | ICD-10-CM | POA: Diagnosis not present

## 2022-05-08 DIAGNOSIS — R42 Dizziness and giddiness: Secondary | ICD-10-CM | POA: Diagnosis not present

## 2022-05-08 DIAGNOSIS — K219 Gastro-esophageal reflux disease without esophagitis: Secondary | ICD-10-CM | POA: Diagnosis not present

## 2022-05-08 DIAGNOSIS — I5022 Chronic systolic (congestive) heart failure: Secondary | ICD-10-CM | POA: Diagnosis not present

## 2022-05-08 DIAGNOSIS — R4701 Aphasia: Secondary | ICD-10-CM | POA: Diagnosis not present

## 2022-05-08 DIAGNOSIS — Z8673 Personal history of transient ischemic attack (TIA), and cerebral infarction without residual deficits: Secondary | ICD-10-CM | POA: Diagnosis not present

## 2022-05-08 DIAGNOSIS — I482 Chronic atrial fibrillation, unspecified: Secondary | ICD-10-CM

## 2022-05-08 DIAGNOSIS — Z79899 Other long term (current) drug therapy: Secondary | ICD-10-CM | POA: Diagnosis not present

## 2022-05-08 DIAGNOSIS — I10 Essential (primary) hypertension: Secondary | ICD-10-CM

## 2022-05-08 DIAGNOSIS — J45909 Unspecified asthma, uncomplicated: Secondary | ICD-10-CM | POA: Insufficient documentation

## 2022-05-08 NOTE — Patient Instructions (Signed)
Stop taking hydralazine due to the low blood pressure.

## 2022-05-08 NOTE — Transitions of Care (Post Inpatient/ED Visit) (Signed)
   05/08/2022  Name: Todd Golden. MRN: [id number] DOB: 06/29/1930  Today's TOC FU Call Status: Today's TOC FU Call Status:: Successful TOC FU Call Competed TOC FU Call Complete Date: 05/08/22  Transition Care Management Follow-up Telephone Call Date of Discharge: 05/03/22 Discharge Facility: [facility]) Type of Discharge: Inpatient Admission Primary Inpatient Discharge Diagnosis:: Chronic atrial fibrillation with rapid ventricular response How have you been since you were released from the hospital?: Better Any questions or concerns?:  (patient needs a new BP monitor/ wife states she is unable to hear the beats. RN discussed where to purchase a new one)  Items Reviewed: Did you receive and understand the discharge instructions provided?: Yes Medications obtained and verified?: Yes (Medications Reviewed) Any new allergies since your discharge?: No Dietary orders reviewed?: Yes Type of Diet Ordered:: low sodium heart healthy Do you have support at home?: Yes People in Home: alone, spouse Name of Support/Comfort Primary Source: [facility] and Equipment/Supplies: Were Home Health Services Ordered?: No Any new equipment or medical supplies ordered?: No  Functional Questionnaire: Do you need assistance with bathing/showering or dressing?: Yes Do you need assistance with meal preparation?: Yes Do you need assistance with eating?: No Do you have difficulty maintaining continence: No Do you need assistance with getting out of bed/getting out of a chair/moving?: No Do you have difficulty managing or taking your medications?: Yes  Follow up appointments reviewed: PCP Follow-up appointment confirmed?: Yes Date of PCP follow-up appointment?: 05/22/22 Follow-up Provider: Dr Darrick Huntsman [facility] Follow-up appointment confirmed?: Yes Date of Specialist follow-up appointment?: 05/08/22 Follow-Up Specialty Provider:: Clarisa Kindred Do you need  transportation to your follow-up appointment?: No Do you understand care options if your condition(s) worsen?: Yes-patient verbalized understanding (Per wife/ Pt has expressive asphasia)  SDOH Interventions Today    Flowsheet Row Most Recent Value  SDOH Interventions   Food Insecurity Interventions Intervention Not Indicated  Housing Interventions Intervention Not Indicated  Transportation Interventions Intervention Not Indicated      Interventions Today    Flowsheet Row Most Recent Value  General Interventions   General Interventions Discussed/Reviewed General Interventions Discussed, General Interventions Reviewed, Doctor Visits  Doctor Visits Discussed/Reviewed Doctor Visits Discussed, Doctor Visits Reviewed, Specialist, PCP  PCP/Specialist Visits Compliance with follow-up visit  Education Interventions   Education Provided Provided Education  Provided Verbal Education On Community Resources  [facility] discussed where to purchase blood pressure monitor/ Daily weights, BP and pulse and documenting to take to Dr visits]  Nutrition Interventions   Nutrition Discussed/Reviewed Decreasing salt, Nutrition Discussed, Nutrition Reviewed  Pharmacy Interventions   Pharmacy Dicussed/Reviewed Pharmacy Topics Discussed, Pharmacy Topics Reviewed, Medications and their functions      TOC Interventions Today    Flowsheet Row Most Recent Value  TOC Interventions   TOC Interventions Discussed/Reviewed TOC Interventions Discussed, TOC Interventions Reviewed       Gean Maidens BSN RN [facility] [phone]

## 2022-05-10 ENCOUNTER — Telehealth: Payer: Self-pay

## 2022-05-10 ENCOUNTER — Encounter: Payer: Self-pay | Admitting: Cardiovascular Disease

## 2022-05-10 ENCOUNTER — Telehealth: Payer: Self-pay | Admitting: *Deleted

## 2022-05-10 ENCOUNTER — Telehealth: Payer: Self-pay | Admitting: Internal Medicine

## 2022-05-10 NOTE — Telephone Encounter (Signed)
Pt's spouse Leander Tout) called regarding concerns of Mr. Repass experiencing dizziness for the past 2 to 5 days while ambulating or performing sudden movements.  Pt's spouse says that pt denies shortness of breath, denies chest pain and nausea and vomiting.  Pt spouse stated that  since Wednesday pt has stopped taking Hydralazine. And his bp today was 116/84.   Pt's spouse stated she is still waiting to hear from Dr.Arida's office. She also wanted to know if it was okay for her husband to spread her medications through out the day instead of taking all am meds at once. RN gave education on medication importance and to wait for Dr. Recommendations. Okay to spread am medications and advised pt's wife that if symptoms get worse during the weekend to call EMS.   RN was able to get pt an appt with Dr. Kirke Corin. However the appt availability was for May 17th .   Pt stated she will try to reach out to Dr. Kirke Corin again today.

## 2022-05-10 NOTE — Telephone Encounter (Signed)
New message    Patient wife called C/o feeling dizziness and nausea today,   Recent discharge on  05/03/22.

## 2022-05-10 NOTE — Patient Outreach (Signed)
  Care Coordination   Follow Up Visit Note   05/10/2022 Name: Todd Corp. MRN: [id number] DOB: 1930-12-06  [name] [name]. is a 89 y.o. year old male who sees Darrick Huntsman, Mar Daring, MD for primary care. I spoke with Oneita Hurt wife by phone today.  Patient wife had concerns that patient I nauseated and dizzy. She had tried to call cardiology and left message x 2.  BP 116/44. Sh is concerned he will fall. Patient is a recent discharge from hospital [id number].   Care Coordination Interventions:  Yes, provided  RN called PCP. Spoke with Dr Darrick Huntsman nurse. They are unable to see the patient today, but will call the patient and help to set up an urgent care appointment on line to be seen..  Follow up plan: No further intervention required.   Encounter Outcome:  Pt. Visit Completed

## 2022-05-10 NOTE — Telephone Encounter (Signed)
I spoke with wife today & she mentioned that his BP today was 116/84. But c/o nausea when he tried to take his meds & dizziness. He has a H/O falls and she is concerned that the dizziness would result in a fall. Was recently diagnosed with A-fib with AVR & d/c from the hospital on 05/03/22.   I spoke with Dr. Darrick Huntsman and she does not have any openings today but feels the he needs to be seen.  I advised wife that if she does not hear back from Cardiology soon that he needs to go to urgent care or the ED.   Wife verbalized understanding.

## 2022-05-14 ENCOUNTER — Encounter: Payer: Self-pay | Admitting: Cardiovascular Disease

## 2022-05-14 ENCOUNTER — Ambulatory Visit: Payer: PPO | Attending: Cardiovascular Disease | Admitting: Cardiovascular Disease

## 2022-05-14 VITALS — BP 134/74 | HR 59 | Ht 71.0 in | Wt 175.1 lb

## 2022-05-14 DIAGNOSIS — I10 Essential (primary) hypertension: Secondary | ICD-10-CM | POA: Diagnosis not present

## 2022-05-14 DIAGNOSIS — I5022 Chronic systolic (congestive) heart failure: Secondary | ICD-10-CM | POA: Diagnosis not present

## 2022-05-14 DIAGNOSIS — I4821 Permanent atrial fibrillation: Secondary | ICD-10-CM | POA: Diagnosis not present

## 2022-05-14 NOTE — Progress Notes (Signed)
Cardiology Office Note   Date:  05/14/2022   ID:  Todd Golden., DOB 1930-09-01, MRN [id number]  PCP:  Sherlene Shams, MD  Cardiologist:   Todd Bears, MD   Chief Complaint  Patient presents with   Follow-up    F/u hosp. C/o Afib/dizziness, fatigue and nausea, Pt doesn't feel well after taking his medications. Meds reviewed verbally with pt.      History of Present Illness: Todd Golden. is a 89 y.o. male who presents for a follow-up visit regarding permanent atrial fibrillation.  Other medical problems include previous stroke, hypertension, hyperlipidemia, and glaucoma.  In November 2015, he was admitted with expressive aphasia and mild ataxia due to left MCA stroke  in the setting of atrial fibrillation.  Echocardiogram showed normal LV function well carotid Doppler showed no obstructive disease.  He was placed on Eliquis.  He moved out of [facility] with his wife and currently they live at the house and [place].    He was taken off anticoagulation with Eliquis due to recurrent falls and dementia.    He was hospitalized in January with a stroke.  He was hospitalized again recently with increased shortness of breath and was noted to be in A-fib with RVR with decompensated heart failure.  He improved with diuresis.  Echocardiogram showed an EF of 35 to 40%.  Reduced ejection fraction was felt to be due to tachycardia induced cardiomyopathy.  Rate control was difficult and ultimately he was given few doses of digoxin and amiodarone was added to Toprol.  His blood pressure was elevated and thus Cardura was added.  Hydrochlorothiazide was discontinued due to initiation of torsemide.  Since hospital discharge, he has been complaining of increased fatigue, nausea and dizziness after he takes his morning medications.  He gets dizzy when he stands up.  Past Medical History:  Diagnosis Date   Allergy    Arthritis    Asthma    Cataract    CHF (congestive heart  failure)    Chronic constipation    Colon polyps    Cough    GERD (gastroesophageal reflux disease)    Glaucoma    HOH (hard of hearing)    a. Uses hearing aids   Hyperlipidemia    Hypertension    Macular degeneration    Nephrolithiasis    stent   PAF (paroxysmal atrial fibrillation)    a. 11/2013 Dx in setting of L MCA stroke. CHA2DS2VASc = 5-->Eliquis; b. 11/2013 Echo: Nl LV fxn. Mildly dil LA.   Stroke    a. 11/2013 L MCA stroke.   Ureteral stricture     Past Surgical History:  Procedure Laterality Date   CATARACT EXTRACTION W/PHACO Left 12/20/2014   Procedure: CATARACT EXTRACTION PHACO AND INTRAOCULAR LENS PLACEMENT (IOC);  Surgeon: Galen Manila, MD;  Location: [facility] ORS;  Service: Ophthalmology;  Laterality: Left;   [place]     00:59.6 AP%    21.2 CDE    12.66 fluid pack lot #[id number]   JOINT REPLACEMENT Left 2014   knee partial replacement   kidney stent Right 1995     Current Outpatient Medications  Medication Sig Dispense Refill   acetaminophen (TYLENOL) 325 MG tablet Take 2 tablets (650 mg total) by mouth every 6 (six) hours as needed for mild pain (or Fever >/= 101).     amiodarone (PACERONE) 200 MG tablet Take 1 tablet (200 mg total) by mouth 2 (two) times daily. 60 tablet 1  aspirin EC 81 MG tablet Take 81 mg by mouth daily. Swallow whole.     cholecalciferol (VITAMIN D3) 25 MCG (1000 UNIT) tablet Take 1 tablet (1,000 Units total) by mouth daily. 30 tablet 0   donepezil (ARICEPT) 10 MG tablet TAKE 1 TABLET(10 MG) BY MOUTH DAILY 90 tablet 1   doxazosin (CARDURA) 2 MG tablet Take 1 tablet (2 mg total) by mouth daily. 30 tablet 1   l-methylfolate-B6-B12 (METANX) 3-35-2 MG TABS tablet Take 1 tablet by mouth daily. 30 tablet 0   latanoprost (XALATAN) 0.005 % ophthalmic solution Place 1 drop into both eyes at bedtime.     magnesium gluconate (MAGONATE) 500 MG tablet Take 0.5 tablets (250 mg total) by mouth at bedtime. 30 tablet 0   metoprolol succinate (TOPROL-XL) 100  MG 24 hr tablet Take 1 tablet (100 mg total) by mouth daily. Take with or immediately following a meal. 30 tablet 1   Multiple Vitamins-Minerals (PRESERVISION AREDS 2 PO) Take by mouth daily.     omeprazole (PRILOSEC) 40 MG capsule Take by mouth 1 capsule daily 90 capsule 3   rosuvastatin (CRESTOR) 10 MG tablet Take 1 tablet (10 mg total) by mouth daily. 30 tablet 1   sertraline (ZOLOFT) 50 MG tablet Take 1 tablet (50 mg total) by mouth daily. 30 tablet 0   tamsulosin (FLOMAX) 0.4 MG CAPS capsule TAKE 2 CAPSULES(0.8 MG) BY MOUTH DAILY AFTER SUPPER 90 capsule 1   torsemide (DEMADEX) 20 MG tablet Take 1 tablet (20 mg total) by mouth daily. 30 tablet 1   No current facility-administered medications for this visit.    Allergies:   Patient has no known allergies.    Social History:  The patient  reports that he has never smoked. He has never been exposed to tobacco smoke. He has never used smokeless tobacco. He reports current alcohol use. He reports that he does not use drugs.   Family History:  The patient's family history includes Arthritis in his mother; Cancer (age of onset: 53) in his mother; Stroke in his father.    ROS:  Please see the history of present illness.   Otherwise, review of systems are positive for none.   All other systems are reviewed and negative.    PHYSICAL EXAM: VS:  BP 134/74 (BP Location: Left Arm, Patient Position: Sitting, Cuff Size: Normal)   Pulse (!) 59   Ht  (1.803 m)   Wt 175 lb 2 oz (79.4 kg)   SpO2 97%   BMI 24.42 kg/m  , BMI Body mass index is 24.42 kg/m. GEN: Well nourished, well developed, in no acute distress  HEENT: normal  Neck: no JVD, carotid bruits, or masses Cardiac: Irregularly irregular; no murmurs, rubs, or gallops,no edema  Respiratory:  clear to auscultation bilaterally, normal work of breathing GI: soft, nontender, nondistended, + BS MS: no deformity or atrophy  Skin: warm and dry, no rash Neuro:  Strength and sensation are  intact Psych: euthymic mood, full affect   EKG:  EKG is ordered today. The ekg ordered today demonstrates atrial fibrillation with a PVC.  Ventricular rate is 59 bpm.  Moderate LVH with repolarization abnormalities.   Recent Labs: 11/02/2021: TSH 1.54 03/20/2022: Magnesium 2.0 03/25/2022: ALT 29 04/30/2022: B Natriuretic Peptide 588.3; Hemoglobin 12.8; Platelets 231 05/03/2022: BUN 19; Creatinine, Ser 1.33; Potassium 3.7; Sodium 142    Lipid Panel    Component Value Date/Time   CHOL 165 03/16/2022 0448   CHOL 133 12/18/2020 0819  CHOL 168 12/04/2013 0549   TRIG 109 03/16/2022 0448   TRIG 96 12/04/2013 0549   HDL 50 03/16/2022 0448   HDL 55 12/18/2020 0819   HDL 44 12/04/2013 0549   CHOLHDL 3.3 03/16/2022 0448   VLDL 22 03/16/2022 0448   VLDL 19 12/04/2013 0549   LDLCALC 93 03/16/2022 0448   LDLCALC 60 12/18/2020 0819   LDLCALC 105 (H) 12/04/2013 0549      Wt Readings from Last 3 Encounters:  05/14/22 175 lb 2 oz (79.4 kg)  05/08/22 176 lb 4 oz (79.9 kg)  05/03/22 175 lb 11.3 oz (79.7 kg)            No data to display            ASSESSMENT AND PLAN:  1.  Permanent atrial fibrillation: Ventricular rate was recently uncontrolled and thus amiodarone was added for rate control.  However, he does not seem to be tolerating the medication well due to nausea and dizziness.  I discontinued amiodarone today.  Continue Toprol 100 mg daily for rate control.  He is still not a candidate for anticoagulation due to frequent falls and dementia.   If ventricular rate increases, the dose of Toprol can be increased.  2.  Essential hypertension: He is orthostatic and blood pressure is on the low side.  I discontinued Cardura.  3.  Chronic systolic heart failure: Moderately reduced LV systolic function likely tachycardia induced cardiomyopathy.  He appears to be euvolemic on small dose torsemide 20 mg daily.  An ARB can be considered upon follow-up.    Disposition:   FU with me  in 1 month.  Signed,  Todd Bears, MD  05/14/2022 4:56 PM    [facility]

## 2022-05-14 NOTE — Patient Instructions (Signed)
Medication Instructions:  STOP the Amiodarone STOP the Cardura  *If you need a refill on your cardiac medications before your next appointment, please call your pharmacy*   Lab Work: None ordered If you have labs (blood work) drawn today and your tests are completely normal, you will receive your results only by: MyChart Message (if you have MyChart) OR A paper copy in the mail If you have any lab test that is abnormal or we need to change your treatment, we will call you to review the results.   Testing/Procedures: None ordered   Follow-Up: At [facility], you and your health needs are our priority.  As part of our continuing mission to provide you with exceptional heart care, we have created designated Provider Care Teams.  These Care Teams include your primary Cardiologist (physician) and Advanced Practice Providers (APPs -  Physician Assistants and Nurse Practitioners) who all work together to provide you with the care you need, when you need it.  We recommend signing up for the patient portal called "MyChart".  Sign up information is provided on this After Visit Summary.  MyChart is used to connect with patients for Virtual Visits (Telemedicine).  Patients are able to view lab/test results, encounter notes, upcoming appointments, etc.  Non-urgent messages can be sent to your provider as well.   To learn more about what you can do with MyChart, go to [email or web address].    Your next appointment:   1 month(s)  Provider:   You will see one of the following Advanced Practice Providers on your designated Care Team:   Nicolasa Ducking, NP Eula Listen, PA-C Cadence Fransico Michael, PA-C Charlsie Quest, NP

## 2022-05-15 ENCOUNTER — Telehealth: Payer: Self-pay | Admitting: Internal Medicine

## 2022-05-15 NOTE — Telephone Encounter (Signed)
Denisa from [place] called in staying they about to discharged pt from his pt and ot. And they were wondering if anything else provider would like for them to do for pt before they discharge him? Any questions, or concern she available [phone]

## 2022-05-22 ENCOUNTER — Encounter: Payer: Self-pay | Admitting: Internal Medicine

## 2022-05-22 ENCOUNTER — Ambulatory Visit (INDEPENDENT_AMBULATORY_CARE_PROVIDER_SITE_OTHER): Payer: PPO

## 2022-05-22 ENCOUNTER — Ambulatory Visit (INDEPENDENT_AMBULATORY_CARE_PROVIDER_SITE_OTHER): Payer: PPO | Admitting: Internal Medicine

## 2022-05-22 VITALS — BP 126/80 | HR 76 | Temp 97.7°F | Ht 71.0 in | Wt 175.0 lb

## 2022-05-22 DIAGNOSIS — K5901 Slow transit constipation: Secondary | ICD-10-CM

## 2022-05-22 DIAGNOSIS — I5020 Unspecified systolic (congestive) heart failure: Secondary | ICD-10-CM

## 2022-05-22 DIAGNOSIS — K5909 Other constipation: Secondary | ICD-10-CM | POA: Diagnosis not present

## 2022-05-22 DIAGNOSIS — R944 Abnormal results of kidney function studies: Secondary | ICD-10-CM

## 2022-05-22 DIAGNOSIS — I480 Paroxysmal atrial fibrillation: Secondary | ICD-10-CM

## 2022-05-22 DIAGNOSIS — I25118 Atherosclerotic heart disease of native coronary artery with other forms of angina pectoris: Secondary | ICD-10-CM | POA: Diagnosis not present

## 2022-05-22 DIAGNOSIS — K5900 Constipation, unspecified: Secondary | ICD-10-CM | POA: Diagnosis not present

## 2022-05-22 DIAGNOSIS — I10 Essential (primary) hypertension: Secondary | ICD-10-CM

## 2022-05-22 LAB — BASIC METABOLIC PANEL
BUN: 25 mg/dL — ABNORMAL HIGH (ref 6–23)
CO2: 34 mEq/L — ABNORMAL HIGH (ref 19–32)
Calcium: 9.5 mg/dL (ref 8.4–10.5)
Chloride: 95 mEq/L — ABNORMAL LOW (ref 96–112)
Creatinine, Ser: 1.59 mg/dL — ABNORMAL HIGH (ref 0.40–1.50)
GFR: 38.15 mL/min — ABNORMAL LOW (ref >60.00)
Glucose, Bld: 94 mg/dL (ref 70–99)
Potassium: 4.2 mEq/L (ref 3.5–5.1)
Sodium: 139 mEq/L (ref 135–145)

## 2022-05-22 NOTE — Assessment & Plan Note (Signed)
Currently controlled without cardura , hydralazine ,  on metoprolol only,

## 2022-05-22 NOTE — Assessment & Plan Note (Signed)
Currently rate controlled on toprol XL 100 mg daily,  amiodarone not tolerated

## 2022-05-22 NOTE — Assessment & Plan Note (Signed)
Currently having only liquid stools since discharge.  Using fiber supplement nightly and dulcolax every 4 nights.  .  Incrase water intake or decrease caffeinated beverages.  Plain abd films ordered.

## 2022-05-22 NOTE — Patient Instructions (Addendum)
Desten should limit his sodium intake to 2000 mg daily.     Michiel's baseline weight is 171 lbs  continue to monitor  his weight daily and double the  torsemide  dose for overnight weight gain of 2 lbs or weekly weight gain of 5 lbs.    Continue  Toprol XL 100 mg . This is his Blood pressure and heart rate controller.  He does not need anything else right now.    Increase water intake to 30 ounces daily or switch to decaf tea.  He needs more liquid to keep bowels moving more regularly   I will prescribe a  laxative once I see the x ray from today.  For joint pain:   You can give Fayrene Fearing 2000 mg of acetominophen (tylenol) every day safely  In divided doses (500 mg every 6 hours  Or 1000 mg every 12 hours.) for shoulder pain

## 2022-05-22 NOTE — Assessment & Plan Note (Signed)
Sodium restriction to 2 grams advised per patient's wife request.  Baseline weight by home scale readings  is 171 lbs.   Advised patient's wif e to continue to monitor wt daily and double the torsemide  dose for overnight weight gain of 2 lbs or weekly weight gain of 5 lbs.

## 2022-05-22 NOTE — Progress Notes (Signed)
Subjective:  Patient ID: Todd Glenn., male    DOB: 10-Oct-1930  Age: 89 y.o. MRN: [id number]  CC: The primary encounter diagnosis was Slow transit constipation. Diagnoses of Decreased GFR, Coronary artery disease of native artery of native heart with stable angina pectoris (HCC), Chronic constipation, Paroxysmal atrial fibrillation (HCC), Systolic heart failure, unspecified HF chronicity (HCC), and Essential hypertension, benign were also pertinent to this visit.   HPI Todd Glenn. presents for  Chief Complaint  Patient presents with   Hospitalization Follow-up    A. 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   presents for follow up on recent admission to [facility]  from April 9 to April 12 at  for atrial  fib with RVR  with decompensated heart failure. He improved with diuresis. Echocardiogram showed an EF of 30 to 35%. Reduced ejection fraction was felt to be due to tachycardia induced cardiomyopathy. Rate control was difficult and ultimately he was given few doses of digoxin and amiodarone was added to Toprol. Hydralazine 25 mg tid was added  for elevated blood pressure ,  as well as  Cardura . Hydrochlorothiazide was discontinued due to initiation of torsemide.   He had follow up with Dr Kirke Corin on April 23 and due to nausea, dizziness and hypotension,  amiodarone and cardura were discontinued  .  Since then , home readings have been ranging from 108/69 to 134/59 on a calibrated machine.  Pulse in the 70's.  Weight  has been steady  baseline  is 171 lbs  Constipation despite using a fiber supplement every night .  His water intake is minimal.  Has one cup of coffee,  2 glasses of tea,  one glass of water.  Stools have been mostly Liquid since discharge.     Outpatient Medications Prior to Visit  Medication Sig Dispense Refill   aspirin EC 81 MG tablet Take 81 mg by  mouth daily. Swallow whole.     cholecalciferol (VITAMIN D3) 25 MCG (1000 UNIT) tablet Take 1 tablet (1,000 Units total) by mouth daily. 30 tablet 0   donepezil (ARICEPT) 10 MG tablet TAKE 1 TABLET(10 MG) BY MOUTH DAILY 90 tablet 1   l-methylfolate-B6-B12 (METANX) 3-35-2 MG TABS tablet Take 1 tablet by mouth daily. 30 tablet 0   latanoprost (XALATAN) 0.005 % ophthalmic solution Place 1 drop into both eyes at bedtime.     magnesium gluconate (MAGONATE) 500 MG tablet Take 0.5 tablets (250 mg total) by mouth at bedtime. 30 tablet 0   metoprolol succinate (TOPROL-XL) 100 MG 24 hr tablet Take 1 tablet (100 mg total) by mouth daily. Take with or immediately following a meal. 30 tablet 1   Multiple Vitamins-Minerals (PRESERVISION AREDS 2 PO) Take by mouth daily.     omeprazole (PRILOSEC) 40 MG capsule Take by mouth 1 capsule daily 90 capsule 3   rosuvastatin (CRESTOR) 10 MG tablet Take 1 tablet (10 mg total) by mouth daily. 30 tablet 1   sertraline (ZOLOFT) 50 MG tablet Take 1 tablet (50 mg total) by mouth daily. 30 tablet 0   tamsulosin (FLOMAX) 0.4 MG CAPS capsule TAKE 2 CAPSULES(0.8 MG) BY MOUTH DAILY AFTER SUPPER 90 capsule 1   torsemide (DEMADEX) 20 MG tablet Take 1 tablet (20 mg total) by mouth daily. 30 tablet 1   acetaminophen (TYLENOL) 325 MG tablet Take 2 tablets (650  mg total) by mouth every 6 (six) hours as needed for mild pain (or Fever >/= 101). (Patient not taking: Reported on 05/22/2022)     No facility-administered medications prior to visit.    Review of Systems;  Patient denies headache, fevers, malaise, unintentional weight loss, skin rash, eye pain, sinus congestion and sinus pain, sore throat, dysphagia,  hemoptysis , cough, dyspnea, wheezing, chest pain, palpitations, orthopnea, edema, abdominal pain, nausea, melena, diarrhea, constipation, flank pain, dysuria, hematuria, urinary  Frequency, nocturia, numbness, tingling, seizures,  Focal weakness, Loss of consciousness,  Tremor,  insomnia, depression, anxiety, and suicidal ideation.      Objective:  BP 126/80   Pulse 76   Temp 97.7 F (36.5 C) (Oral)   Ht 5\' 11"  (1.803 m)   Wt 175 lb (79.4 kg)   SpO2 95%   BMI 24.41 kg/m   BP Readings from Last 3 Encounters:  05/22/22 126/80  05/14/22 134/74  05/08/22 99/70    Wt Readings from Last 3 Encounters:  05/22/22 175 lb (79.4 kg)  05/14/22 175 lb 2 oz (79.4 kg)  05/08/22 176 lb 4 oz (79.9 kg)    Physical Exam Vitals reviewed.  Constitutional:      General: He is not in acute distress.    Appearance: Normal appearance. He is normal weight. He is not ill-appearing, toxic-appearing or diaphoretic.  HENT:     Head: Normocephalic.  Eyes:     General: No scleral icterus.       Right eye: No discharge.        Left eye: No discharge.     Conjunctiva/sclera: Conjunctivae normal.  Cardiovascular:     Rate and Rhythm: Normal rate and regular rhythm.     Heart sounds: Normal heart sounds.  Pulmonary:     Effort: Pulmonary effort is normal. No respiratory distress.     Breath sounds: Normal breath sounds.  Musculoskeletal:        General: Normal range of motion.     Cervical back: Normal range of motion.  Skin:    General: Skin is warm and dry.  Neurological:     General: No focal deficit present.     Mental Status: He is alert and oriented to person, place, and time. Mental status is at baseline.     Cranial Nerves: Cranial nerve deficit present.     Comments: Dysarthric   Psychiatric:        Mood and Affect: Mood normal.        Behavior: Behavior normal.        Thought Content: Thought content normal.        Judgment: Judgment normal.    Lab Results  Component Value Date   HGBA1C 5.6 03/16/2022   HGBA1C 6.2 11/02/2021   HGBA1C 6.3 08/02/2020    Lab Results  Component Value Date   CREATININE 1.59 (H) 05/22/2022   CREATININE 1.33 (H) 05/03/2022   CREATININE 1.34 (H) 05/02/2022    Lab Results  Component Value Date   WBC 6.6 04/30/2022    HGB 12.8 (L) 04/30/2022   HCT 39.8 04/30/2022   PLT 231 04/30/2022   GLUCOSE 94 05/22/2022   CHOL 165 03/16/2022   TRIG 109 03/16/2022   HDL 50 03/16/2022   LDLCALC 93 03/16/2022   ALT 29 03/25/2022   AST 35 03/25/2022   NA 139 05/22/2022   K 4.2 05/22/2022   CL 95 (L) 05/22/2022   CREATININE 1.59 (H) 05/22/2022   BUN 25 (H) 05/22/2022  CO2 34 (H) 05/22/2022   TSH 1.54 11/02/2021   PSA 3.32 11/02/2021   INR 1.1 03/15/2022   HGBA1C 5.6 03/16/2022    ECHOCARDIOGRAM LIMITED  Result Date: 05/03/2022    ECHOCARDIOGRAM LIMITED REPORT   Patient Name:   Negan Grudzien. Date of Exam: 05/03/2022 Medical Rec #:  [id number]            Height:       71.0 in Accession #:    [id number]           Weight:       175.7 lb Date of Birth:  1930-04-21            BSA:          1.996 m Patient Age:    89 years             BP:           120/92 mmHg Patient Gender: M                    HR:           92 bpm. Exam Location:  [facility] Procedure: Limited Echo and Limited Color Doppler Indications:     Atrial Fibrillation  History:         Patient has no prior history of Echocardiogram examinations.                  CHF, CAD and Acute MI, Stroke, Arrythmias:Atrial Fibrillation,                  Signs/Symptoms:Altered Mental Status; Risk Factors:Hypertension                  and Dyslipidemia.  Sonographer:     Mikki Harbor Referring Phys:  [id number] Antonieta Iba Diagnosing Phys: Julien Nordmann MD IMPRESSIONS  1. Left ventricular ejection fraction, by estimation, is 30 to 35%. Left ventricular ejection fraction by PLAX is 27 %. The left ventricle has moderately decreased function. The left ventricle demonstrates global hypokinesis. Left ventricular diastolic parameters are indeterminate.  2. Right ventricular systolic function is normal. The right ventricular size is normal. Tricuspid regurgitation signal is inadequate for assessing PA pressure.  3. Left atrial size was mild to moderately dilated.  4. The mitral valve  is normal in structure. Mild mitral valve regurgitation. No evidence of mitral stenosis.  5. The aortic valve is normal in structure. There is mild calcification of the aortic valve. Aortic valve regurgitation is not visualized. Aortic valve sclerosis/calcification is present, without any evidence of aortic stenosis.  6. There is borderline dilatation of the aortic root, measuring 39 mm.  7. The inferior vena cava is normal in size with greater than 50% respiratory variability, suggesting right atrial pressure of 3 mmHg.  8. Rhythm is atrial fibrillation FINDINGS  Left Ventricle: Left ventricular ejection fraction, by estimation, is 30 to 35%. Left ventricular ejection fraction by PLAX is 27 %. The left ventricle has moderately decreased function. The left ventricle demonstrates global hypokinesis. The left ventricular internal cavity size was normal in size. There is no left ventricular hypertrophy. Left ventricular diastolic parameters are indeterminate. Right Ventricle: The right ventricular size is normal. No increase in right ventricular wall thickness. Right ventricular systolic function is normal. Tricuspid regurgitation signal is inadequate for assessing PA pressure. Left Atrium: Left atrial size was mild to moderately dilated. Right Atrium: Right atrial size was normal in size. Pericardium: There is no  evidence of pericardial effusion. Mitral Valve: The mitral valve is normal in structure. There is mild calcification of the mitral valve leaflet(s). Mild mitral valve regurgitation. No evidence of mitral valve stenosis. MV peak gradient, 3.6 mmHg. The mean mitral valve gradient is 1.0 mmHg. Tricuspid Valve: The tricuspid valve is normal in structure. Tricuspid valve regurgitation is not demonstrated. No evidence of tricuspid stenosis. Aortic Valve: The aortic valve is normal in structure. There is mild calcification of the aortic valve. Aortic valve regurgitation is not visualized. Aortic valve  sclerosis/calcification is present, without any evidence of aortic stenosis. Pulmonic Valve: The pulmonic valve was normal in structure. Pulmonic valve regurgitation is not visualized. No evidence of pulmonic stenosis. Aorta: The aortic root is normal in size and structure. There is borderline dilatation of the aortic root, measuring 39 mm. Venous: The inferior vena cava is normal in size with greater than 50% respiratory variability, suggesting right atrial pressure of 3 mmHg. IAS/Shunts: No atrial level shunt detected by color flow Doppler. LEFT VENTRICLE PLAX 2D LV EF:         Left ventricular ejection fraction by PLAX is 27 %. LVIDd:         4.80 cm LVIDs:         4.20 cm LV PW:         1.35 cm LV IVS:        1.25 cm LVOT diam:     2.00 cm LVOT Area:     3.14 cm  LEFT ATRIUM             Index        RIGHT ATRIUM           Index LA diam:        4.10 cm 2.05 cm/m   RA Area:     17.90 cm LA Vol (A2C):   76.3 ml 38.23 ml/m  RA Volume:   51.60 ml  25.86 ml/m LA Vol (A4C):   54.6 ml 27.36 ml/m LA Biplane Vol: 65.2 ml 32.67 ml/m   AORTA Ao Root diam: 3.90 cm MITRAL VALVE MV Area (PHT): 4.19 cm    SHUNTS MV Peak grad:  3.6 mmHg    Systemic Diam: 2.00 cm MV Mean grad:  1.0 mmHg MV Vmax:       0.95 m/s MV Vmean:      54.3 cm/s MV Decel Time: 181 msec MV E velocity: 71.30 cm/s Julien Nordmann MD Electronically signed by Julien Nordmann MD Signature Date/Time: 05/03/2022/1:26:25 PM    Final     Assessment & Plan:  .Slow transit constipation -     DG Abd 1 View; Future  Decreased GFR -     Basic metabolic panel  Coronary artery disease of native artery of native heart with stable angina pectoris (HCC) -     Do not attempt resuscitation (DNR)  Chronic constipation Assessment & Plan: Currently having only liquid stools since discharge.  Using fiber supplement nightly and dulcolax every 4 nights.  .  Incrase water intake or decrease caffeinated beverages.  Plain abd films ordered.    Paroxysmal atrial  fibrillation (HCC) Assessment & Plan: Currently rate controlled on toprol XL 100 mg daily,  amiodarone not tolerated    Systolic heart failure, unspecified HF chronicity (HCC) Assessment & Plan: Sodium restriction to 2 grams advised per patient's wife request.  Baseline weight by home scale readings  is 171 lbs.   Advised patient's wif e to continue to monitor wt daily  and double the torsemide  dose for overnight weight gain of 2 lbs or weekly weight gain of 5 lbs.     Essential hypertension, benign Assessment & Plan: Currently controlled without cardura , hydralazine ,  on metoprolol only,       I provided 30 minutes of face-to-face time during this encounter reviewing patient's last visit with me, patient's  most recent visit with cardiology,  nephrology,  and neurology,  recent surgical and non surgical procedures, previous  labs and imaging studies, counseling on currently addressed issues,  and post visit ordering to diagnostics and therapeutics .   Follow-up: No follow-ups on file.   Sherlene Shams, MD

## 2022-05-23 ENCOUNTER — Other Ambulatory Visit: Payer: Self-pay | Admitting: Internal Medicine

## 2022-05-23 MED ORDER — LACTULOSE 20 GM/30ML PO SOLN: ORAL | 3 refills | Status: DC

## 2022-05-23 NOTE — Addendum Note (Signed)
Addended by: Sherlene Shams on: 05/23/2022 09:31 PM   Modules accepted: Orders

## 2022-06-04 ENCOUNTER — Ambulatory Visit (INDEPENDENT_AMBULATORY_CARE_PROVIDER_SITE_OTHER): Payer: PPO

## 2022-06-04 VITALS — Wt 175.0 lb

## 2022-06-04 DIAGNOSIS — Z0000 Encounter for general adult medical examination without abnormal findings: Secondary | ICD-10-CM

## 2022-06-04 NOTE — Progress Notes (Cosign Needed Addendum)
Subjective:   Todd Golden. is a 89 y.o. male who presents for Medicare Annual/Subsequent preventive examination.  Review of Systems    I connected with  Todd Golden. on 06/04/22 by a audio enabled telemedicine application and verified that I am speaking with the correct person using two identifiers. Patient's wife Todd Golden  was present along with patient to assist with visit.  Patient Location: Home  Provider Location: Home Office  I discussed the limitations of evaluation and management by telemedicine. The patient expressed understanding and agreed to proceed.  Cardiac Risk Factors include: Other (see comment);hypertension, Risk factor comments: demenita, Dysphagia     Objective:    Today's Vitals   06/04/22 0850  Weight: 175 lb (79.4 kg)   Body mass index is 24.41 kg/m.     06/04/2022    8:56 AM 04/30/2022   11:22 PM 04/30/2022   10:11 PM 04/30/2022    9:57 AM 03/19/2022    5:37 PM 03/15/2022    4:00 PM 07/12/2021   10:20 PM  Advanced Directives  Does Patient Have a Medical Advance Directive? No  No Yes No No No  Type of Chief of Staff of [place];Living will     Would patient like information on creating a medical advance directive? Yes (MAU/Ambulatory/Procedural Areas - Information given) No - Patient declined   No - Patient declined No - Patient declined     Current Medications (verified) Outpatient Encounter Medications as of 06/04/2022  Medication Sig   acetaminophen (TYLENOL) 325 MG tablet Take 2 tablets (650 mg total) by mouth every 6 (six) hours as needed for mild pain (or Fever >/= 101).   aspirin EC 81 MG tablet Take 81 mg by mouth daily. Swallow whole.   cholecalciferol (VITAMIN D3) 25 MCG (1000 UNIT) tablet Take 1 tablet (1,000 Units total) by mouth daily.   donepezil (ARICEPT) 10 MG tablet TAKE 1 TABLET(10 MG) BY MOUTH DAILY   l-methylfolate-B6-B12 (METANX) 3-35-2 MG TABS tablet Take 1 tablet by mouth  daily.   Lactulose 20 GM/30ML SOLN 30 ml every 4 hours until constipation is relieved   latanoprost (XALATAN) 0.005 % ophthalmic solution Place 1 drop into both eyes at bedtime.   magnesium gluconate (MAGONATE) 500 MG tablet Take 0.5 tablets (250 mg total) by mouth at bedtime.   metoprolol succinate (TOPROL-XL) 100 MG 24 hr tablet Take 1 tablet (100 mg total) by mouth daily. Take with or immediately following a meal.   Multiple Vitamins-Minerals (PRESERVISION AREDS 2 PO) Take by mouth daily.   omeprazole (PRILOSEC) 40 MG capsule Take by mouth 1 capsule daily   rosuvastatin (CRESTOR) 10 MG tablet Take 1 tablet (10 mg total) by mouth daily.   sertraline (ZOLOFT) 50 MG tablet Take 1 tablet (50 mg total) by mouth daily.   tamsulosin (FLOMAX) 0.4 MG CAPS capsule TAKE 2 CAPSULES(0.8 MG) BY MOUTH DAILY AFTER SUPPER   torsemide (DEMADEX) 20 MG tablet Take 1 tablet (20 mg total) by mouth daily.   No facility-administered encounter medications on file as of 06/04/2022.    Allergies (verified) Patient has no known allergies.   History: Past Medical History:  Diagnosis Date   Allergy    Arthritis    Asthma    Atrial fibrillation with RVR (HCC) 05/01/2022   Cataract    CHF (congestive heart failure) (HCC)    Chronic atrial fibrillation with rapid ventricular response (HCC) 04/30/2022   Chronic constipation  Colon polyps    Cough    GERD (gastroesophageal reflux disease)    Glaucoma    HOH (hard of hearing)    a. Uses hearing aids   Hyperlipidemia    Hypertension    Macular degeneration    Nephrolithiasis    stent   PAF (paroxysmal atrial fibrillation) (HCC)    a. 11/2013 Dx in setting of L MCA stroke. CHA2DS2VASc = 5-->Eliquis; b. 11/2013 Echo: Nl LV fxn. Mildly dil LA.   Stroke [facility])    a. 11/2013 L MCA stroke.   Ureteral stricture    Past Surgical History:  Procedure Laterality Date   CATARACT EXTRACTION W/PHACO Left 12/20/2014   Procedure: CATARACT EXTRACTION PHACO AND  INTRAOCULAR LENS PLACEMENT (IOC);  Surgeon: Galen Manila, MD;  Location: [facility] ORS;  Service: Ophthalmology;  Laterality: Left;   [place]     00:59.6 AP%    21.2 CDE    12.66 fluid pack lot #[id number]   JOINT REPLACEMENT Left 2014   knee partial replacement   kidney stent Right 1995   Family History  Problem Relation Age of Onset   Arthritis Mother    Cancer Mother 31       kidney   Stroke Father    Social History   Socioeconomic History   Marital status: Married    Spouse name: Todd Golden   Number of children: 3   Years of education: 12   Highest education level: 12th grade  Occupational History   Not on file  Tobacco Use   Smoking status: Never    Passive exposure: Never   Smokeless tobacco: Never  Vaping Use   Vaping Use: Never used  Substance and Sexual Activity   Alcohol use: Yes    Comment: rarely   Drug use: No   Sexual activity: Not Currently  Other Topics Concern   Not on file  Social History Narrative   ** Merged History Encounter **       Social Determinants of Health   Financial Resource Strain: Low Risk  (06/04/2022)   Overall Financial Resource Strain (CARDIA)    Difficulty of Paying Living Expenses: Not hard at all  Food Insecurity: No Food Insecurity (06/04/2022)   Hunger Vital Sign    Worried About Running Out of Food in the Last Year: Never true    Ran Out of Food in the Last Year: Never true  Transportation Needs: Patient Unable To Answer (06/04/2022)   PRAPARE - Transportation    Lack of Transportation (Medical): Patient unable to answer    Lack of Transportation (Non-Medical): Patient unable to answer  Physical Activity: Insufficiently Active (06/04/2022)   Exercise Vital Sign    Days of Exercise per Week: 7 days    Minutes of Exercise per Session: 20 min  Stress: No Stress Concern Present (06/04/2022)   [employer] of Occupational Health - Occupational Stress Questionnaire    Feeling of Stress : Not at all  Social Connections:  Patient Unable To Answer (06/04/2022)   Social Connection and Isolation Panel [NHANES]    Frequency of Communication with Friends and Family: Patient unable to answer    Frequency of Social Gatherings with Friends and Family: Patient unable to answer    Attends Religious Services: Patient unable to answer    Active Member of Clubs or Organizations: Patient unable to answer    Attends Banker Meetings: Patient unable to answer    Marital Status: Patient unable to answer    Tobacco  Counseling Counseling given: Yes   Clinical Intake:  Pre-visit preparation completed: Yes  Pain : No/denies pain     BMI - recorded: 24.41 Nutritional Risks: None Diabetes: No  How often do you need to have someone help you when you read instructions, pamphlets, or other written materials from your doctor or pharmacy?: 1 - Never  Diabetic?NO  Interpreter Needed?: No  Information entered by :: Fredirick Maudlin   Activities of Daily Living    06/04/2022    8:58 AM 04/30/2022   10:11 PM  In your present state of health, do you have any difficulty performing the following activities:  Hearing? 1 0  Vision? 1 0  Difficulty concentrating or making decisions? 0 1  Walking or climbing stairs? 1 1  Dressing or bathing? 0 1  Doing errands, shopping? 1 1  Comment  requires help  Preparing Food and eating ? Y   Using the Toilet? N   In the past six months, have you accidently leaked urine? Y   Do you have problems with loss of bowel control? Y   Managing your Medications? N   Managing your Finances? N   Housekeeping or managing your Housekeeping? Y     Patient Care Team: Sherlene Shams, MD as PCP - General (Internal Medicine) Iran Ouch, MD as PCP - Cardiology (Cardiology)  Indicate any recent Medical Services you may have received from other than [facility] providers in the past year (date may be approximate).     Assessment:   This is a routine wellness examination for  Todd Golden.  Hearing/Vision screen Hearing Screening - Comments:: Wears hearing aides in both ears Vision Screening - Comments:: up to date with [facility] eye exams with  [facility]  Dietary issues and exercise activities discussed: Current Exercise Habits: The patient does not participate in regular exercise at present, Exercise limited by: cardiac condition(s);Other - see comments (demenita)   Goals Addressed   None   Depression Screen    06/04/2022    8:54 AM 05/22/2022    9:32 AM 04/03/2022    8:44 AM 05/23/2021   12:28 PM 01/01/2021    2:41 PM 11/14/2020   10:04 AM 11/02/2020   10:38 AM  PHQ 2/9 Scores  PHQ - 2 Score 0 2 0 0  0 0  PHQ- 9 Score 0 10       Exception Documentation     Other- indicate reason in comment box Other- indicate reason in comment box   Not completed     did not speak with patient Verified by Wife - Todd Golden     Fall Risk    06/04/2022    8:58 AM 05/22/2022    9:15 AM 04/03/2022    8:44 AM 12/17/2021    9:46 AM 07/25/2021    9:55 AM  Fall Risk   Falls in the past year? 1 1 1 1 1   Number falls in past yr: 1 1 1 1 1   Injury with Fall? 1 1 1 1 1   Risk for fall due to : History of fall(s);Impaired balance/gait;Orthopedic patient History of fall(s) History of fall(s) History of fall(s);Impaired balance/gait   Follow up Education provided;Falls prevention discussed;Falls evaluation completed Falls evaluation completed Falls evaluation completed Falls evaluation completed     FALL RISK PREVENTION PERTAINING TO THE HOME:  Any stairs in or around the home? Yes  If so, are there any without handrails? No  Home free of loose throw rugs in walkways,  pet beds, electrical cords, etc? No  Adequate lighting in your home to reduce risk of falls? Yes   ASSISTIVE DEVICES UTILIZED TO PREVENT FALLS:  Life alert? Yes  Use of a cane, walker or w/c? Yes  Grab bars in the bathroom? Yes  Shower chair or bench in shower? Yes  Elevated toilet seat or a handicapped  toilet? Yes   TIMED UP AND GO:  Was the test performed?  No televisit .    Cognitive Function:    02/12/2016    3:43 PM 02/10/2015    8:42 AM  MMSE - Mini Mental State Exam  Orientation to time 5 5  Orientation to Place 5 5  Registration 3 3  Attention/ Calculation 5 5  Recall 3 3  Language- name 2 objects 2 2  Language- repeat 1 1  Language- follow 3 step command 3 3  Language- read & follow direction 1 1  Write a sentence 1 1  Copy design 1 1  Total score 30 30        09/23/2019    9:13 AM 02/12/2018    1:38 PM 02/11/2017    3:36 PM  6CIT Screen  What Year? 0 points 0 points 0 points  What month? 0 points 0 points 0 points  What time?  0 points 0 points  Count back from 20  0 points 0 points  Months in reverse 0 points 0 points 0 points  Repeat phrase  0 points 0 points  Total Score  0 points 0 points    Immunizations Immunization History  Administered Date(s) Administered   Fluad Quad(high Dose 65+) 11/02/2020, 11/02/2021   Influenza,inj,Quad PF,6+ Mos 10/21/2012   Influenza-Unspecified 11/09/2013, 11/18/2014, 11/10/2015, 11/20/2016, 11/19/2017, 11/08/2018, 11/20/2019   Moderna Sars-Covid-2 Vaccination 02/23/2019, 03/23/2019, 11/20/2019   Pneumococcal Conjugate-13 12/10/2013   Pneumococcal Polysaccharide-23 10/21/2009, 02/08/2015, 04/27/2020   Tdap 06/12/2013   Zoster Recombinat (Shingrix) 09/22/2020, 12/21/2020   Zoster, Live 09/27/2015    TDAP status: Up to date  Flu Vaccine status: Up to date  Pneumococcal vaccine status: Up to date  Covid-19 vaccine status: Completed vaccines  Qualifies for Shingles Vaccine? Yes   Zostavax completed Yes   Shingrix Completed?: Yes  Screening Tests Health Maintenance  Topic Date Due   COVID-19 Vaccine (4 - 2023-24 season) 06/07/2022 (Originally 09/21/2021)   INFLUENZA VACCINE  08/22/2022   Medicare Annual Wellness (AWV)  06/04/2023   DTaP/Tdap/Td (2 - Td or Tdap) 06/13/2023   Pneumonia Vaccine 21+ Years old   Completed   Zoster Vaccines- Shingrix  Completed   HPV VACCINES  Aged Out    Health Maintenance  There are no preventive care reminders to display for this patient.   Colorectal cancer screening: No longer required.   Lung Cancer Screening: (Low Dose CT Chest recommended if Age 71-80 years, 30 pack-year currently smoking OR have quit w/in 15years.) does not qualify.   Additional Screening:  Hepatitis C Screening: does not qualify; Completed 07/28/14  Vision Screening: Recommended annual ophthalmology exams for early detection of glaucoma and other disorders of the eye. Is the patient up to date with their annual eye exam?  Yes  Who is the provider or what is the name of the office in which the patient attends annual eye exams? [facility] If pt is not established with a provider, would they like to be referred to a provider to establish care? No .   Dental Screening: Recommended annual dental exams for proper oral hygiene  Community Resource Referral / Chronic Care Management: CRR required this visit?  No   CCM required this visit?  No      Plan:     I have personally reviewed and noted the following in the patient's chart:   Medical and social history Use of alcohol, tobacco or illicit drugs  Current medications and supplements including opioid prescriptions. Patient is not currently taking opioid prescriptions. Functional ability and status Nutritional status Physical activity Advanced directives List of other physicians Hospitalizations, surgeries, and ER visits in previous 12 months Vitals Screenings to include cognitive, depression, and falls Referrals and appointments  In addition, I have reviewed and discussed with patient certain preventive protocols, quality metrics, and best practice recommendations. A written personalized care plan for preventive services as well as general preventive health recommendations were provided to patient.     Annabell Sabal, CMA   06/04/2022   Nurse Notes: None  I have reviewed the above information and agree with above.   Duncan Dull, MD

## 2022-06-04 NOTE — Patient Instructions (Signed)
Todd Golden , Thank you for taking time to come for your Medicare Wellness Visit. I appreciate your ongoing commitment to your health goals. Please review the following plan we discussed and let me know if I can assist you in the future.   These are the goals we discussed:  Goals       Patient Stated     DIET - REDUCE PORTION SIZE (pt-stated)      Healthy diet        This is a list of the screening recommended for you and due dates:  Health Maintenance  Topic Date Due   COVID-19 Vaccine (4 - 2023-24 season) 06/07/2022*   Flu Shot  08/22/2022   Medicare Annual Wellness Visit  06/04/2023   DTaP/Tdap/Td vaccine (2 - Td or Tdap) 06/13/2023   Pneumonia Vaccine  Completed   Zoster (Shingles) Vaccine  Completed   HPV Vaccine  Aged Out  *Topic was postponed. The date shown is not the original due date.    Advanced directives:Advance directive discussed with you today. I have provided a copy for you to complete at home and have notarized. Once this is complete please bring a copy in to our office so we can scan it into your chart.   Conditions/risks identified: Dyshidrotic Eczema Dyshidrotic eczema, also known as pompholyx, is a type of eczema that causes very itchy, fluid-filled blisters (vesicles) to form on the hands and feet. It is more common before age 50, though it can affect people of any age. There is no cure, but treatment and certain lifestyle changes can help relieve symptoms. What are the causes? The cause of this condition is not known. What increases the risk? You are more likely to develop this condition if: You wash your hands frequently. You have a personal or family history of eczema, allergies, asthma, or hay fever. You are allergic to metals, such as nickel or cobalt. You work with cement. You smoke. What are the signs or symptoms? Symptoms of this condition may affect the hands, the feet, or both. Symptoms may come and go (recur), and may include: Severe  itching. This may happen before blisters appear. Blisters. These may form suddenly. In the early stages, blisters may form near the fingertips. In severe cases, blisters may grow to large blister masses (bullae). Blisters resolve in 2-3 weeks without bursting. This is followed by a dry phase in which itching eases. Pain and swelling. Cracks or long, narrow openings (fissures) in the skin. Severe dryness. Ridges on the nails. How is this diagnosed? This condition may be diagnosed based on: Your symptoms and a physical exam. Your medical history. Skin scrapings to rule out a fungal infection. Testing a swab of fluid for bacteria (culture). Removing a small piece of skin (biopsy) to test for infection or to rule out other conditions. Skin patch tests. These tests involve using patches that contain possible allergens and placing them on your back. Your health care provider will wait a few days and then check to see if an allergic reaction occurred. These tests may be done if your health care provider suspects allergic reactions, or to rule out other types of eczema. You may be referred to a health care provider who specializes in skin conditions (dermatologist) to help diagnose and treat this condition. How is this treated? There is no cure for this condition, but treatment can help relieve symptoms. Depending on the amount and severity of the blisters, your health care provider may suggest: Avoiding allergens,  irritants, or triggers that worsen symptoms. This may involve lifestyle changes, such as: Using different lotions or soaps. Avoiding hot weather or places that will cause you to sweat a lot. Managing stress with coping techniques, such as relaxation and exercise, and asking for help when you need it. Diet changes as recommended by your health care provider. Using a clean, damp towel (cool compress) to relieve symptoms. Soaking in a bath that contains a type of salt that relieves  irritation (aluminum acetate soaks). Medicines, such as: Medicine taken by mouth to reduce itching (oral antihistamines). Medicine applied to the skin to reduce swelling and irritation (topical corticosteroids). Medicine that reduces the activity of the body's disease-fighting system (immunosuppressants) to treat inflammation. This may be given in severe cases. Antibiotic medicines to treat bacterial infection. Light therapy (phototherapy). This involves shining ultraviolet (UV) light on the affected skin in order to reduce itchiness and inflammation. Follow these instructions at home: Bathing and skin care  Wash skin gently. After bathing or washing your hands, pat your skin dry. Avoid rubbing your skin. Remove all jewelry before bathing. If the skin under the jewelry stays wet, blisters may form or get worse. Apply cool compresses as told by your health care provider. To do this: Soak a clean towel in cool water. Wring out excess water until towel is damp. Place the towel over the affected skin. Leave the towel on for 20 minutes at a time, 2-3 times a day. Use mild soaps, cleansers, and lotions that do not contain dyes, perfumes, or other irritants. Keep your skin hydrated. To do this: Avoid very hot water. Take lukewarm baths or showers. Apply moisturizer within 3 minutes of bathing. This locks in moisture. Medicines Take and apply over-the-counter and prescription medicines only as told by your health care provider. If you were prescribed an antibiotic medicine, take or apply it as told by your health care provider. Do not stop using the antibiotic even if you start to feel better. General instructions Do not use any products that contain nicotine or tobacco. These include cigarettes, chewing tobacco, and vaping devices, such as e-cigarettes. If you need help quitting, ask your health care provider. Identify and avoid triggers and allergens. Keep fingernails short to avoid breaking the  skin while scratching. Use waterproof gloves to protect your hands when doing work that keeps your hands wet for a long time. Wear socks to keep your feet dry. Keep all follow-up visits. This is important. Contact a health care provider if: You have symptoms that do not go away. You have signs of infection, such as: Crusting, pus, or a bad smell. More redness, swelling, or pain. Increased warmth in the affected area. Get help right away if: Your skin gets streaking redness with associated pain. Summary Dyshidrotic eczema, also known as pompholyx, is a type of eczema that causes very itchy, fluid-filled blisters (vesicles) to form on the hands and feet. The cause of this condition is not known. There is no cure for this condition, but treatment can help relieve symptoms. Treatment depends on the amount and severity of the blisters. Use mild soaps, cleansers, and lotions that do not contain dyes, perfumes, or other irritants. Keep your skin hydrated. This information is not intended to replace advice given to you by your health care provider. Make sure you discuss any questions you have with your health care provider. Document Revised: 10/18/2019 Document Reviewed: 10/18/2019 Elsevier Patient Education  2023 Elsevier Inc.   Next appointment: Follow up in one  year for your annual wellness visit. 06/05/2023  Preventive Care 65 Years and Older, Male  Preventive care refers to lifestyle choices and visits with your health care provider that can promote health and wellness. What does preventive care include? A yearly physical exam. This is also called an annual well check. Dental exams once or twice a year. Routine eye exams. Ask your health care provider how often you should have your eyes checked. Personal lifestyle choices, including: Daily care of your teeth and gums. Regular physical activity. Eating a healthy diet. Avoiding tobacco and drug use. Limiting alcohol use. Practicing  safe sex. Taking low doses of aspirin every day. Taking vitamin and mineral supplements as recommended by your health care provider. What happens during an annual well check? The services and screenings done by your health care provider during your annual well check will depend on your age, overall health, lifestyle risk factors, and family history of disease. Counseling  Your health care provider may ask you questions about your: Alcohol use. Tobacco use. Drug use. Emotional well-being. Home and relationship well-being. Sexual activity. Eating habits. History of falls. Memory and ability to understand (cognition). Work and work Astronomer. Screening  You may have the following tests or measurements: Height, weight, and BMI. Blood pressure. Lipid and cholesterol levels. These may be checked every 5 years, or more frequently if you are over 66 years old. Skin check. Lung cancer screening. You may have this screening every year starting at age 43 if you have a 30-pack-year history of smoking and currently smoke or have quit within the past 15 years. Fecal occult blood test (FOBT) of the stool. You may have this test every year starting at age 74. Flexible sigmoidoscopy or colonoscopy. You may have a sigmoidoscopy every 5 years or a colonoscopy every 10 years starting at age 9. Prostate cancer screening. Recommendations will vary depending on your family history and other risks. Hepatitis C blood test. Hepatitis B blood test. Sexually transmitted disease (STD) testing. Diabetes screening. This is done by checking your blood sugar (glucose) after you have not eaten for a while (fasting). You may have this done every 1-3 years. Abdominal aortic aneurysm (AAA) screening. You may need this if you are a current or former smoker. Osteoporosis. You may be screened starting at age 57 if you are at high risk. Talk with your health care provider about your test results, treatment options, and  if necessary, the need for more tests. Vaccines  Your health care provider may recommend certain vaccines, such as: Influenza vaccine. This is recommended every year. Tetanus, diphtheria, and acellular pertussis (Tdap, Td) vaccine. You may need a Td booster every 10 years. Zoster vaccine. You may need this after age 80. Pneumococcal 13-valent conjugate (PCV13) vaccine. One dose is recommended after age 73. Pneumococcal polysaccharide (PPSV23) vaccine. One dose is recommended after age 72. Talk to your health care provider about which screenings and vaccines you need and how often you need them. This information is not intended to replace advice given to you by your health care provider. Make sure you discuss any questions you have with your health care provider. Document Released: 02/03/2015 Document Revised: 09/27/2015 Document Reviewed: 11/08/2014 Elsevier Interactive Patient Education  2017 [employer].  Fall Prevention in the Home Falls can cause injuries. They can happen to people of all ages. There are many things you can do to make your home safe and to help prevent falls. What can I do on the outside of my  home? Regularly fix the edges of walkways and driveways and fix any cracks. Remove anything that might make you trip as you walk through a door, such as a raised step or threshold. Trim any bushes or trees on the path to your home. Use bright outdoor lighting. Clear any walking paths of anything that might make someone trip, such as rocks or tools. Regularly check to see if handrails are loose or broken. Make sure that both sides of any steps have handrails. Any raised decks and porches should have guardrails on the edges. Have any leaves, snow, or ice cleared regularly. Use sand or salt on walking paths during winter. Clean up any spills in your garage right away. This includes oil or grease spills. What can I do in the bathroom? Use night lights. Install grab bars by the  toilet and in the tub and shower. Do not use towel bars as grab bars. Use non-skid mats or decals in the tub or shower. If you need to sit down in the shower, use a plastic, non-slip stool. Keep the floor dry. Clean up any water that spills on the floor as soon as it happens. Remove soap buildup in the tub or shower regularly. Attach bath mats securely with double-sided non-slip rug tape. Do not have throw rugs and other things on the floor that can make you trip. What can I do in the bedroom? Use night lights. Make sure that you have a light by your bed that is easy to reach. Do not use any sheets or blankets that are too big for your bed. They should not hang down onto the floor. Have a firm chair that has side arms. You can use this for support while you get dressed. Do not have throw rugs and other things on the floor that can make you trip. What can I do in the kitchen? Clean up any spills right away. Avoid walking on wet floors. Keep items that you use a lot in easy-to-reach places. If you need to reach something above you, use a strong step stool that has a grab bar. Keep electrical cords out of the way. Do not use floor polish or wax that makes floors slippery. If you must use wax, use non-skid floor wax. Do not have throw rugs and other things on the floor that can make you trip. What can I do with my stairs? Do not leave any items on the stairs. Make sure that there are handrails on both sides of the stairs and use them. Fix handrails that are broken or loose. Make sure that handrails are as long as the stairways. Check any carpeting to make sure that it is firmly attached to the stairs. Fix any carpet that is loose or worn. Avoid having throw rugs at the top or bottom of the stairs. If you do have throw rugs, attach them to the floor with carpet tape. Make sure that you have a light switch at the top of the stairs and the bottom of the stairs. If you do not have them, ask someone  to add them for you. What else can I do to help prevent falls? Wear shoes that: Do not have high heels. Have rubber bottoms. Are comfortable and fit you well. Are closed at the toe. Do not wear sandals. If you use a stepladder: Make sure that it is fully opened. Do not climb a closed stepladder. Make sure that both sides of the stepladder are locked into place. Ask someone  to hold it for you, if possible. Clearly mark and make sure that you can see: Any grab bars or handrails. First and last steps. Where the edge of each step is. Use tools that help you move around (mobility aids) if they are needed. These include: Canes. Walkers. Scooters. Crutches. Turn on the lights when you go into a dark area. Replace any light bulbs as soon as they burn out. Set up your furniture so you have a clear path. Avoid moving your furniture around. If any of your floors are uneven, fix them. If there are any pets around you, be aware of where they are. Review your medicines with your doctor. Some medicines can make you feel dizzy. This can increase your chance of falling. Ask your doctor what other things that you can do to help prevent falls. This information is not intended to replace advice given to you by your health care provider. Make sure you discuss any questions you have with your health care provider. Document Released: 11/03/2008 Document Revised: 06/15/2015 Document Reviewed: 02/11/2014 Elsevier Interactive Patient Education  2017 [employer].

## 2022-06-07 ENCOUNTER — Encounter: Payer: Self-pay | Admitting: Cardiovascular Disease

## 2022-06-07 ENCOUNTER — Ambulatory Visit: Payer: PPO | Attending: Cardiovascular Disease | Admitting: Cardiovascular Disease

## 2022-06-07 VITALS — BP 84/60 | HR 77 | Ht 71.0 in | Wt 177.4 lb

## 2022-06-07 DIAGNOSIS — I4821 Permanent atrial fibrillation: Secondary | ICD-10-CM | POA: Diagnosis not present

## 2022-06-07 DIAGNOSIS — I10 Essential (primary) hypertension: Secondary | ICD-10-CM

## 2022-06-07 DIAGNOSIS — I5022 Chronic systolic (congestive) heart failure: Secondary | ICD-10-CM | POA: Diagnosis not present

## 2022-06-07 NOTE — Progress Notes (Signed)
Cardiology Office Note   Date:  06/07/2022   ID:  Todd Glenn., DOB 1930-06-26, MRN [id number]  PCP:  Sherlene Shams, MD  Cardiologist:   Lorine Bears, MD   Chief Complaint  Patient presents with   Follow-up    Per HF clinic no concerns today. Meds reviewed verbally with pt.      History of Present Illness: Todd Golden. is a 89 y.o. male who presents for a follow-up visit regarding permanent atrial fibrillation.  Other medical problems include previous stroke, hypertension, hyperlipidemia, and glaucoma.  In November 2015, he was admitted with expressive aphasia and mild ataxia due to left MCA stroke  in the setting of atrial fibrillation.  Echocardiogram showed normal LV function well carotid Doppler showed no obstructive disease.  He was placed on Eliquis.  He moved out of [facility] with his wife and currently they live at the house and [place].    He was taken off anticoagulation with Eliquis due to recurrent falls and dementia.    He was hospitalized in January with a stroke.  He was hospitalized again in April with increased shortness of breath and was noted to be in A-fib with RVR with decompensated heart failure.  He improved with diuresis.  Echocardiogram showed an EF of 35 to 40%.  Reduced ejection fraction was felt to be due to tachycardia induced cardiomyopathy.  Rate control was difficult and ultimately he was given few doses of digoxin and amiodarone was added to Toprol.  His blood pressure was elevated and thus Cardura was added.  Hydrochlorothiazide was discontinued due to initiation of torsemide.  I saw him last month and he was having GI symptoms. I stopped Amiodarone. I also stopped Cardura due to orthostatic dizziness.   Reports improved symptoms overall with less shortness of breath.  GI symptoms resolved.  He is mildly hypotensive but denies dizziness or syncope.  He checks his blood pressure at home daily and I reviewed the readings.  His  systolic blood pressure ranges from 120 to 130 bpm.   Past Medical History:  Diagnosis Date   Allergy    Arthritis    Asthma    Atrial fibrillation with RVR (HCC) 05/01/2022   Cataract    CHF (congestive heart failure) (HCC)    Chronic atrial fibrillation with rapid ventricular response (HCC) 04/30/2022   Chronic constipation    Colon polyps    Cough    GERD (gastroesophageal reflux disease)    Glaucoma    HOH (hard of hearing)    a. Uses hearing aids   Hyperlipidemia    Hypertension    Macular degeneration    Nephrolithiasis    stent   PAF (paroxysmal atrial fibrillation) (HCC)    a. 11/2013 Dx in setting of L MCA stroke. CHA2DS2VASc = 5-->Eliquis; b. 11/2013 Echo: Nl LV fxn. Mildly dil LA.   Stroke [facility])    a. 11/2013 L MCA stroke.   Ureteral stricture     Past Surgical History:  Procedure Laterality Date   CATARACT EXTRACTION W/PHACO Left 12/20/2014   Procedure: CATARACT EXTRACTION PHACO AND INTRAOCULAR LENS PLACEMENT (IOC);  Surgeon: Galen Manila, MD;  Location: [facility] ORS;  Service: Ophthalmology;  Laterality: Left;   [place]     00:59.6 AP%    21.2 CDE    12.66 fluid pack lot #[id number]   JOINT REPLACEMENT Left 2014   knee partial replacement   kidney stent Right 1995     Current  Outpatient Medications  Medication Sig Dispense Refill   acetaminophen (TYLENOL) 325 MG tablet Take 2 tablets (650 mg total) by mouth every 6 (six) hours as needed for mild pain (or Fever >/= 101).     aspirin EC 81 MG tablet Take 81 mg by mouth daily. Swallow whole.     cholecalciferol (VITAMIN D3) 25 MCG (1000 UNIT) tablet Take 1 tablet (1,000 Units total) by mouth daily. 30 tablet 0   Cyanocobalamin (B-12 PO) Take by mouth daily in the afternoon.     donepezil (ARICEPT) 10 MG tablet TAKE 1 TABLET(10 MG) BY MOUTH DAILY 90 tablet 1   l-methylfolate-B6-B12 (METANX) 3-35-2 MG TABS tablet Take 1 tablet by mouth daily. 30 tablet 0   Lactulose 20 GM/30ML SOLN 30 ml every 4 hours until  constipation is relieved 437 mL 3   latanoprost (XALATAN) 0.005 % ophthalmic solution Place 1 drop into both eyes at bedtime.     magnesium gluconate (MAGONATE) 500 MG tablet Take 0.5 tablets (250 mg total) by mouth at bedtime. 30 tablet 0   metoprolol succinate (TOPROL-XL) 100 MG 24 hr tablet Take 1 tablet (100 mg total) by mouth daily. Take with or immediately following a meal. 30 tablet 1   Multiple Vitamins-Minerals (PRESERVISION AREDS 2 PO) Take by mouth 2 (two) times daily.     omeprazole (PRILOSEC) 40 MG capsule Take by mouth 1 capsule daily 90 capsule 3   rosuvastatin (CRESTOR) 10 MG tablet Take 1 tablet (10 mg total) by mouth daily. 30 tablet 1   sertraline (ZOLOFT) 50 MG tablet Take 1 tablet (50 mg total) by mouth daily. 30 tablet 0   tamsulosin (FLOMAX) 0.4 MG CAPS capsule TAKE 2 CAPSULES(0.8 MG) BY MOUTH DAILY AFTER SUPPER 90 capsule 1   torsemide (DEMADEX) 20 MG tablet Take 1 tablet (20 mg total) by mouth daily. 30 tablet 1   No current facility-administered medications for this visit.    Allergies:   Patient has no known allergies.    Social History:  The patient  reports that he has never smoked. He has never been exposed to tobacco smoke. He has never used smokeless tobacco. He reports current alcohol use. He reports that he does not use drugs.   Family History:  The patient's family history includes Arthritis in his mother; Cancer (age of onset: 48) in his mother; Stroke in his father.    ROS:  Please see the history of present illness.   Otherwise, review of systems are positive for none.   All other systems are reviewed and negative.    PHYSICAL EXAM: VS:  BP (!) 84/60 (BP Location: Left Arm, Patient Position: Sitting, Cuff Size: Normal)   Pulse 77   Ht 5\' 11"  (1.803 m)   Wt 177 lb 6 oz (80.5 kg)   SpO2 96%   BMI 24.74 kg/m  , BMI Body mass index is 24.74 kg/m. GEN: Well nourished, well developed, in no acute distress  HEENT: normal  Neck: no JVD, carotid  bruits, or masses Cardiac: Irregularly irregular; no murmurs, rubs, or gallops,no edema  Respiratory:  clear to auscultation bilaterally, normal work of breathing GI: soft, nontender, nondistended, + BS MS: no deformity or atrophy  Skin: warm and dry, no rash Neuro:  Strength and sensation are intact Psych: euthymic mood, full affect   EKG:  EKG is ordered today. The ekg ordered today demonstrates atrial fibrillation with moderate LVH and left anterior fascicular block.  Recent Labs: 11/02/2021: TSH 1.54 03/20/2022: Magnesium  2.0 03/25/2022: ALT 29 04/30/2022: B Natriuretic Peptide 588.3; Hemoglobin 12.8; Platelets 231 05/22/2022: BUN 25; Creatinine, Ser 1.59; Potassium 4.2; Sodium 139    Lipid Panel    Component Value Date/Time   CHOL 165 03/16/2022 0448   CHOL 133 12/18/2020 0819   CHOL 168 12/04/2013 0549   TRIG 109 03/16/2022 0448   TRIG 96 12/04/2013 0549   HDL 50 03/16/2022 0448   HDL 55 12/18/2020 0819   HDL 44 12/04/2013 0549   CHOLHDL 3.3 03/16/2022 0448   VLDL 22 03/16/2022 0448   VLDL 19 12/04/2013 0549   LDLCALC 93 03/16/2022 0448   LDLCALC 60 12/18/2020 0819   LDLCALC 105 (H) 12/04/2013 0549      Wt Readings from Last 3 Encounters:  06/07/22 177 lb 6 oz (80.5 kg)  06/04/22 175 lb (79.4 kg)  05/22/22 175 lb (79.4 kg)            No data to display            ASSESSMENT AND PLAN:  1.  Permanent atrial fibrillation: Ventricular rate is controlled on Toprol 100 mg once daily.   He is still not a candidate for anticoagulation due to frequent falls and dementia.    2.  Essential hypertension: He is mildly hypertensive today but home blood pressure readings are normal.  He is only on Toprol.  I made no changes.  3.  Chronic systolic heart failure: Moderately reduced LV systolic function likely tachycardia induced cardiomyopathy.  I agree with decreasing torsemide to 20 mg every other day due to slight worsening of renal function.  His weight has been  stable and he appears to be euvolemic.    Disposition:   FU with me in 3 months.  Signed,  Lorine Bears, MD  06/07/2022 8:12 AM    [facility]

## 2022-06-07 NOTE — Patient Instructions (Signed)
Medication Instructions:  No changes *If you need a refill on your cardiac medications before your next appointment, please call your pharmacy*   Lab Work: None ordered If you have labs (blood work) drawn today and your tests are completely normal, you will receive your results only by: MyChart Message (if you have MyChart) OR A paper copy in the mail If you have any lab test that is abnormal or we need to change your treatment, we will call you to review the results.   Testing/Procedures: None ordered   Follow-Up: At [facility], you and your health needs are our priority.  As part of our continuing mission to provide you with exceptional heart care, we have created designated Provider Care Teams.  These Care Teams include your primary Cardiologist (physician) and Advanced Practice Providers (APPs -  Physician Assistants and Nurse Practitioners) who all work together to provide you with the care you need, when you need it.  We recommend signing up for the patient portal called "MyChart".  Sign up information is provided on this After Visit Summary.  MyChart is used to connect with patients for Virtual Visits (Telemedicine).  Patients are able to view lab/test results, encounter notes, upcoming appointments, etc.  Non-urgent messages can be sent to your provider as well.   To learn more about what you can do with MyChart, go to [email or web address].    Your next appointment:   3 month(s)  Provider:   You may see Muhammad Arida, MD or one of the following Advanced Practice Providers on your designated Care Team:   Christopher Berge, NP Ryan Dunn, PA-C Cadence Furth, PA-C Sheri Hammock, NP     

## 2022-06-19 ENCOUNTER — Encounter: Payer: PPO | Admitting: Family

## 2022-06-25 ENCOUNTER — Ambulatory Visit: Payer: PPO | Attending: Family | Admitting: Family

## 2022-06-25 ENCOUNTER — Encounter: Payer: Self-pay | Admitting: Family

## 2022-06-25 VITALS — BP 111/80 | HR 80 | Wt 177.2 lb

## 2022-06-25 DIAGNOSIS — I5022 Chronic systolic (congestive) heart failure: Secondary | ICD-10-CM | POA: Insufficient documentation

## 2022-06-25 DIAGNOSIS — Z79899 Other long term (current) drug therapy: Secondary | ICD-10-CM | POA: Diagnosis not present

## 2022-06-25 DIAGNOSIS — N189 Chronic kidney disease, unspecified: Secondary | ICD-10-CM | POA: Insufficient documentation

## 2022-06-25 DIAGNOSIS — I69391 Dysphagia following cerebral infarction: Secondary | ICD-10-CM | POA: Diagnosis not present

## 2022-06-25 DIAGNOSIS — R296 Repeated falls: Secondary | ICD-10-CM | POA: Diagnosis not present

## 2022-06-25 DIAGNOSIS — I5020 Unspecified systolic (congestive) heart failure: Secondary | ICD-10-CM | POA: Diagnosis present

## 2022-06-25 DIAGNOSIS — I4821 Permanent atrial fibrillation: Secondary | ICD-10-CM | POA: Insufficient documentation

## 2022-06-25 DIAGNOSIS — I130 Hypertensive heart and chronic kidney disease with heart failure and stage 1 through stage 4 chronic kidney disease, or unspecified chronic kidney disease: Secondary | ICD-10-CM | POA: Insufficient documentation

## 2022-06-25 DIAGNOSIS — R1310 Dysphagia, unspecified: Secondary | ICD-10-CM | POA: Insufficient documentation

## 2022-06-25 DIAGNOSIS — Z823 Family history of stroke: Secondary | ICD-10-CM | POA: Diagnosis not present

## 2022-06-25 DIAGNOSIS — I69320 Aphasia following cerebral infarction: Secondary | ICD-10-CM | POA: Diagnosis not present

## 2022-06-25 DIAGNOSIS — E785 Hyperlipidemia, unspecified: Secondary | ICD-10-CM | POA: Diagnosis not present

## 2022-06-25 DIAGNOSIS — I10 Essential (primary) hypertension: Secondary | ICD-10-CM

## 2022-06-25 DIAGNOSIS — R4701 Aphasia: Secondary | ICD-10-CM | POA: Diagnosis not present

## 2022-06-25 NOTE — Progress Notes (Signed)
ZOX:WRUEA, Mar Daring, MD (last seen 05/24) Primary Cardiologist: Lorine Bears, MD (last seen 05/24)  HPI:  Mr Mccullar is a 89 y/o male with a history of asthma, hyperlipidemia, HTN, CKD, stroke, GERD, macular degeneration, nephrolithiasis,  permanent AF and chronic heart failure. In November 2015, he was admitted with expressive aphasia and mild ataxia due to left MCA stroke in the setting of atrial fibrillation. Taken off eliquis due to recurrent falls.   Echo 05/03/22: EF 30-35% along with mild MR with atrial fibrillation.   Admitted 04/30/22 due to SOB/ cough and found to be in AF RVR. Diltiazem gtt started & 2 doses of digoxin given along with starting on amiodarone. Admitted 03/19/22 due to needing inpatient rehab. Discharged after 10 days. Admitted 03/15/22 due to acute falls with dragging of his right leg. Expressive aphasia present from previous stroke. MRI confirmed stroke.  No large vessel occlusion on CT angio. Transferred to inpatient rehab.   He presents today for a HF f/u visit with a chief complaint of intermittent dizziness. Chronic in nature although seems to be less since his BP has risen. Denies fatigue, SOB, cough, chest pain, palpitations, edema or difficulty sleeping.  Hydralazine stopped at last OV due to low BP & BP has improved (111/80). Has fallen since last here and has an abrasion on his right wrist.   ROS: All systems negative except as listed in HPI, PMH and Problem List.  SH:  Social History   Socioeconomic History   Marital status: Married    Spouse name: Nuh Meeds   Number of children: 3   Years of education: 12   Highest education level: 12th grade  Occupational History   Not on file  Tobacco Use   Smoking status: Never    Passive exposure: Never   Smokeless tobacco: Never  Vaping Use   Vaping Use: Never used  Substance and Sexual Activity   Alcohol use: Yes    Comment: rarely   Drug use: No   Sexual activity: Not Currently  Other Topics  Concern   Not on file  Social History Narrative   ** Merged History Encounter **       Social Determinants of Health   Financial Resource Strain: Low Risk  (06/04/2022)   Overall Financial Resource Strain (CARDIA)    Difficulty of Paying Living Expenses: Not hard at all  Food Insecurity: No Food Insecurity (06/04/2022)   Hunger Vital Sign    Worried About Running Out of Food in the Last Year: Never true    Ran Out of Food in the Last Year: Never true  Transportation Needs: Patient Unable To Answer (06/04/2022)   PRAPARE - Transportation    Lack of Transportation (Medical): Patient unable to answer    Lack of Transportation (Non-Medical): Patient unable to answer  Physical Activity: Insufficiently Active (06/04/2022)   Exercise Vital Sign    Days of Exercise per Week: 7 days    Minutes of Exercise per Session: 20 min  Stress: No Stress Concern Present (06/04/2022)   [employer] of Occupational Health - Occupational Stress Questionnaire    Feeling of Stress : Not at all  Social Connections: Patient Unable To Answer (06/04/2022)   Social Connection and Isolation Panel [NHANES]    Frequency of Communication with Friends and Family: Patient unable to answer    Frequency of Social Gatherings with Friends and Family: Patient unable to answer    Attends Religious Services: Patient unable to answer    Active Member  of Clubs or Organizations: Patient unable to answer    Attends Club or Organization Meetings: Patient unable to answer    Marital Status: Patient unable to answer  Intimate Partner Violence: Not At Risk (06/04/2022)   Humiliation, Afraid, Rape, and Kick questionnaire    Fear of Current or Ex-Partner: No    Emotionally Abused: No    Physically Abused: No    Sexually Abused: No    FH:  Family History  Problem Relation Age of Onset   Arthritis Mother    Cancer Mother 46       kidney   Stroke Father     Past Medical History:  Diagnosis Date   Allergy    Arthritis     Asthma    Atrial fibrillation with RVR (HCC) 05/01/2022   Cataract    CHF (congestive heart failure) (HCC)    Chronic atrial fibrillation with rapid ventricular response (HCC) 04/30/2022   Chronic constipation    Colon polyps    Cough    GERD (gastroesophageal reflux disease)    Glaucoma    HOH (hard of hearing)    a. Uses hearing aids   Hyperlipidemia    Hypertension    Macular degeneration    Nephrolithiasis    stent   PAF (paroxysmal atrial fibrillation) (HCC)    a. 11/2013 Dx in setting of L MCA stroke. CHA2DS2VASc = 5-->Eliquis; b. 11/2013 Echo: Nl LV fxn. Mildly dil LA.   Stroke [facility])    a. 11/2013 L MCA stroke.   Ureteral stricture     Current Outpatient Medications  Medication Sig Dispense Refill   acetaminophen (TYLENOL) 325 MG tablet Take 2 tablets (650 mg total) by mouth every 6 (six) hours as needed for mild pain (or Fever >/= 101).     aspirin EC 81 MG tablet Take 81 mg by mouth daily. Swallow whole.     cholecalciferol (VITAMIN D3) 25 MCG (1000 UNIT) tablet Take 1 tablet (1,000 Units total) by mouth daily. 30 tablet 0   Cyanocobalamin (B-12 PO) Take by mouth daily in the afternoon.     donepezil (ARICEPT) 10 MG tablet TAKE 1 TABLET(10 MG) BY MOUTH DAILY 90 tablet 1   l-methylfolate-B6-B12 (METANX) 3-35-2 MG TABS tablet Take 1 tablet by mouth daily. 30 tablet 0   Lactulose 20 GM/30ML SOLN 30 ml every 4 hours until constipation is relieved 437 mL 3   latanoprost (XALATAN) 0.005 % ophthalmic solution Place 1 drop into both eyes at bedtime.     magnesium gluconate (MAGONATE) 500 MG tablet Take 0.5 tablets (250 mg total) by mouth at bedtime. 30 tablet 0   metoprolol succinate (TOPROL-XL) 100 MG 24 hr tablet Take 1 tablet (100 mg total) by mouth daily. Take with or immediately following a meal. 30 tablet 1   Multiple Vitamins-Minerals (PRESERVISION AREDS 2 PO) Take by mouth 2 (two) times daily.     omeprazole (PRILOSEC) 40 MG capsule Take by mouth 1 capsule daily 90  capsule 3   rosuvastatin (CRESTOR) 10 MG tablet Take 1 tablet (10 mg total) by mouth daily. 30 tablet 1   sertraline (ZOLOFT) 50 MG tablet Take 1 tablet (50 mg total) by mouth daily. 30 tablet 0   tamsulosin (FLOMAX) 0.4 MG CAPS capsule TAKE 2 CAPSULES(0.8 MG) BY MOUTH DAILY AFTER SUPPER 90 capsule 1   torsemide (DEMADEX) 20 MG tablet Take 1 tablet (20 mg total) by mouth daily. (Patient taking differently: Take 20 mg by mouth every other day.) 30  tablet 1   No current facility-administered medications for this visit.   Vitals:   06/25/22 1114  BP: 111/80  Pulse: 80  SpO2: 100%  Weight: 177 lb 3.2 oz (80.4 kg)   Wt Readings from Last 3 Encounters:  06/25/22 177 lb 3.2 oz (80.4 kg)  06/07/22 177 lb 6 oz (80.5 kg)  06/04/22 175 lb (79.4 kg)   Lab Results  Component Value Date   CREATININE 1.59 (H) 05/22/2022   CREATININE 1.33 (H) 05/03/2022   CREATININE 1.34 (H) 05/02/2022   PHYSICAL EXAM:  General:  Well appearing. No resp difficulty HEENT: normal Neck: supple. JVP flat. No lymphadenopathy or thryomegaly appreciated. Cor: PMI normal. Regular rate & rhythm. No rubs, gallops or murmurs. Lungs: clear Abdomen: soft, nontender, nondistended. No hepatosplenomegaly. No bruits or masses.  Extremities: no cyanosis, clubbing, rash, edema Neuro: alert & oriented x3. Moves all 4 extremities w/o difficulty. Affect pleasant. Skin: abrasion on right wrist due to a fall; no signs of infection   ECG: on 06/07/22 showed AF with moderate LVH and left anterior fascicular block   ASSESSMENT & PLAN:  1: Ischemic heart failure with reduced ejection fraction- - NYHA class I - euvolemic - weighing daily and home weight chart reviewed and ranges from 172-174 pounds; reminded to call for an overnight weight gain of > 2 pounds or a weekly weight gain of > 5 pounds - weight stable from last visit here 6 weeks ago - Echo 05/03/22: EF 30-35% along with mild MR with atrial fibrillation.  - not adding  salt to his food & is using NoSalt for seasoning - continue metoprolol succinate 100mg  daily - continue torsemide 20mg  every other day - low BP and frequent falls limits any other GDMT - BNP 04/30/22 was 588.3  2: HTN- - BP 111/80; home BP chart reviewed and shows SBP ranging from 110's- 130's & DBP 60's-80's - saw PCP Darrick Huntsman) 05/24 - BMP 05/22/22 showed sodium 139, potassium 4.2, creatinine 1.59 & GFR 38.15  3: Permanent Atrial fibrillation- - saw cardiology Kirke Corin) 05/24; returns 08/24 - not on NOAC due to frequent falls  4: Stroke- - has residual dysphagia & expressive aphasia - walking with a walker - has fallen since last here and has an abrasion on his right wrist  Due to HF stability, will not make a return appointment at this time. Advised patient and his wife to follow closely with cardiology and PCP but that they could call back at anytime for questions or to make another appointment and they were comfortable with this plan.

## 2022-06-25 NOTE — Patient Instructions (Signed)
Call us in the future if you need us for anything 

## 2022-06-30 ENCOUNTER — Encounter: Payer: Self-pay | Admitting: Internal Medicine

## 2022-07-02 ENCOUNTER — Other Ambulatory Visit: Payer: Self-pay | Admitting: Internal Medicine

## 2022-07-02 MED ORDER — METOPROLOL SUCCINATE ER 100 MG PO TB24: 100.0000 mg | ORAL_TABLET | Freq: Every day | ORAL | 1 refills | Status: DC

## 2022-07-02 MED ORDER — ROSUVASTATIN CALCIUM 10 MG PO TABS: 10.0000 mg | ORAL_TABLET | Freq: Every day | ORAL | 1 refills | Status: DC

## 2022-07-03 ENCOUNTER — Other Ambulatory Visit: Payer: Self-pay | Admitting: Internal Medicine

## 2022-07-03 MED ORDER — SERTRALINE HCL 50 MG PO TABS: 50.0000 mg | ORAL_TABLET | Freq: Every day | ORAL | 1 refills | Status: DC

## 2022-07-03 NOTE — Addendum Note (Signed)
Addended by: Sandy Salaam on: 07/03/2022 08:48 AM   Modules accepted: Orders

## 2022-07-03 NOTE — Telephone Encounter (Signed)
Looks like medication was discontinued at discharge. Is it okay to refuse?

## 2022-07-11 IMAGING — CT CT HEAD W/O CM
4 series · 16 of 47 positions shown, 18 images · non-contrast
Comparison: CT head 12/03/2013.  MRI head 12/03/2013

CLINICAL DATA: Recent fall with head injury.  On Eliquis.

EXAM:
CT HEAD WITHOUT CONTRAST
TECHNIQUE: Contiguous axial images were obtained from the base of the skull
through the vertex without intravenous contrast.

[Series 2: head wo · axial · 0.43mm/px · z∈[-128,-8]mm · 7 of 33 slices shown, 9 images]
[im 5/33  brain]
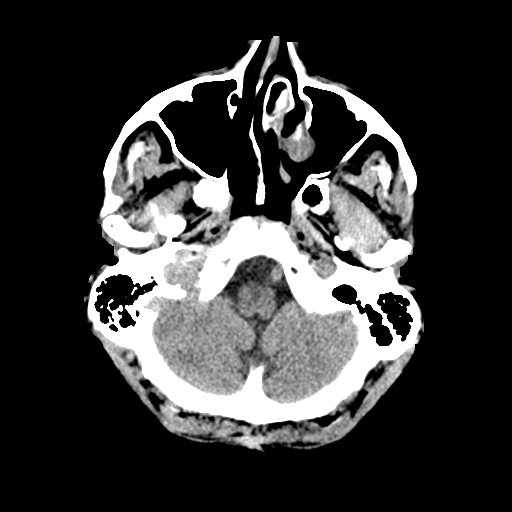
[im 5/33  bone]
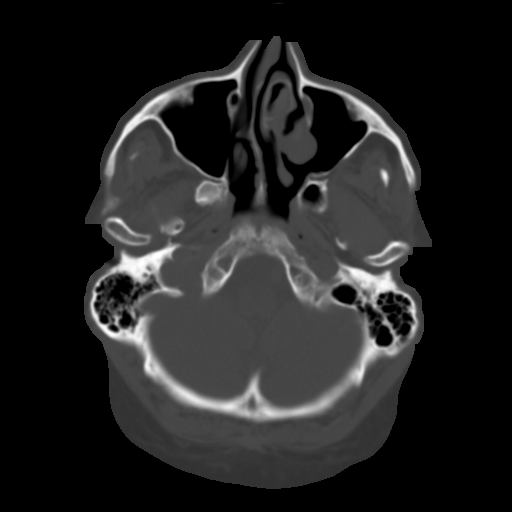
[im 9/33  brain]
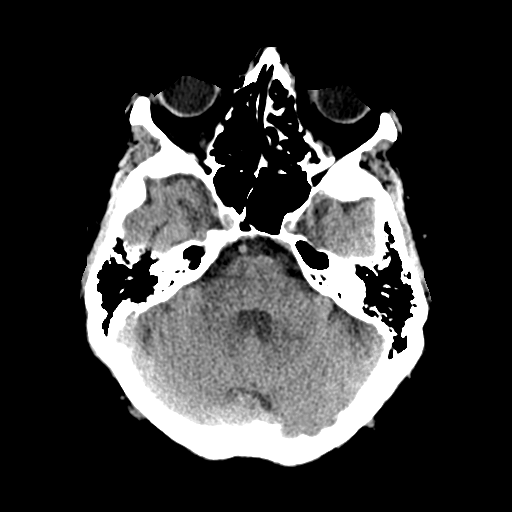
[im 13/33  brain]
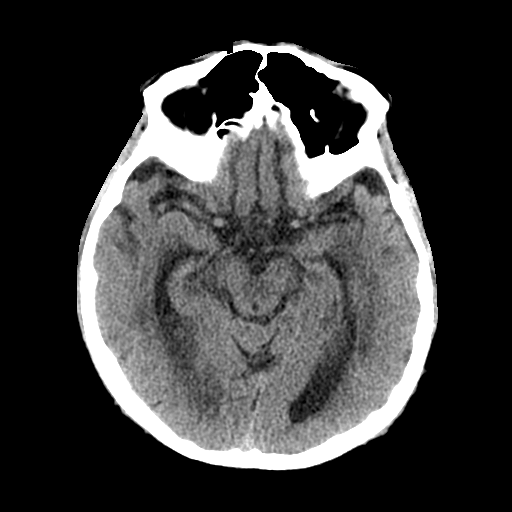
[im 17/33  brain]
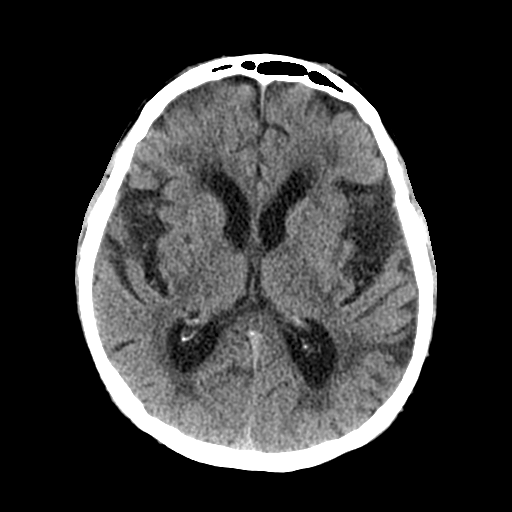
[im 21/33  brain]
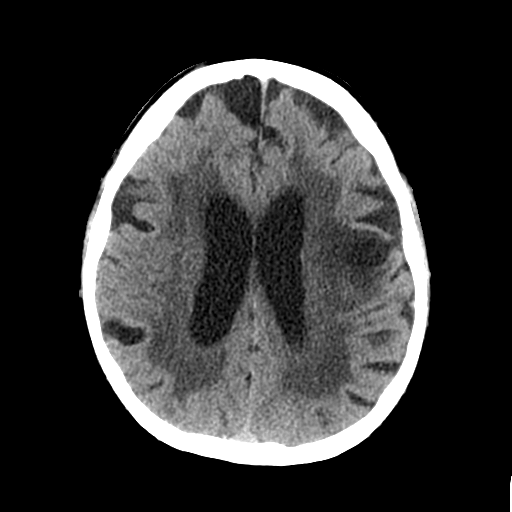
[im 21/33  bone]
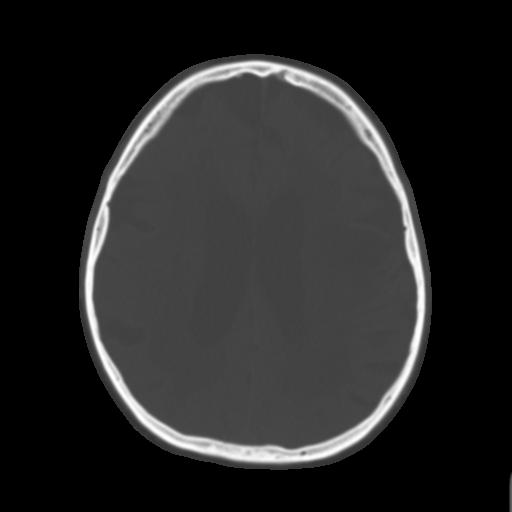
[im 25/33  brain]
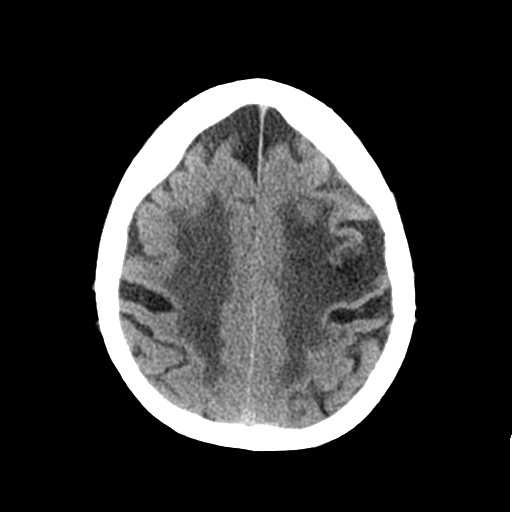
[im 29/33  brain]
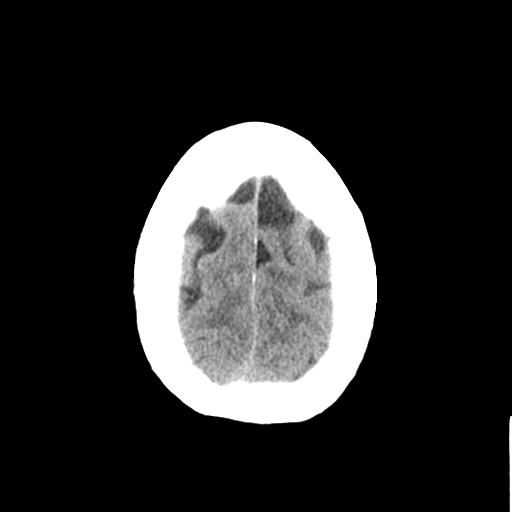

[Series 3: head bone · axial · 0.43mm/px · z∈[-132,-100]mm · 3 of 83 slices shown]
[im 9/83  bone]
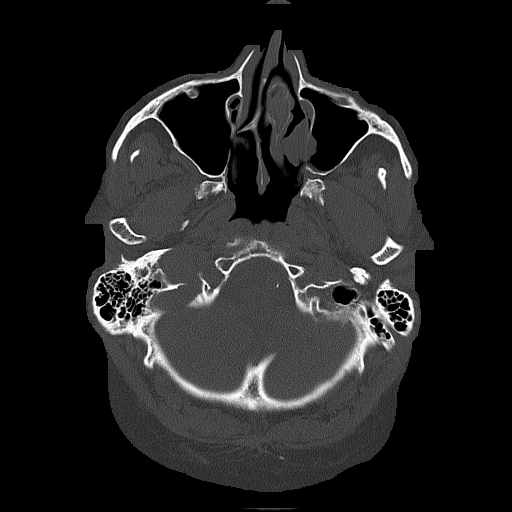
[im 17/83  bone]
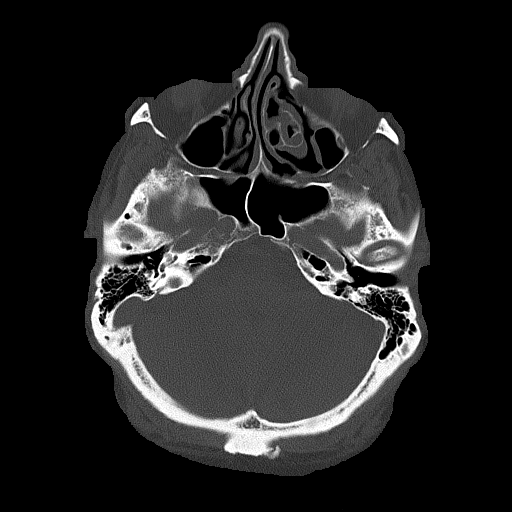
[im 25/83  bone]
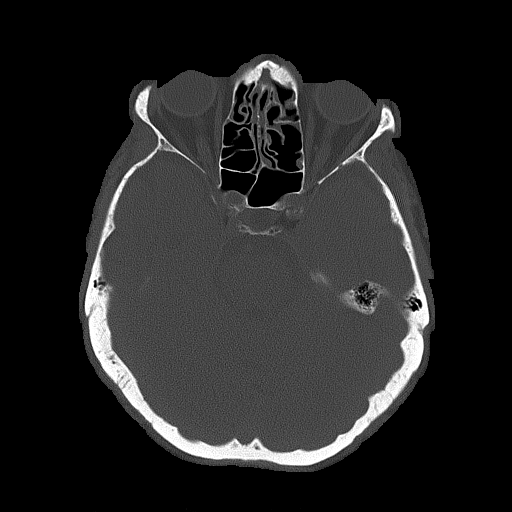

[Series 4: coronal soft tissue · coronal · 0.35mm/px · 3 of 73 slices shown]
[im 25/73  brain]
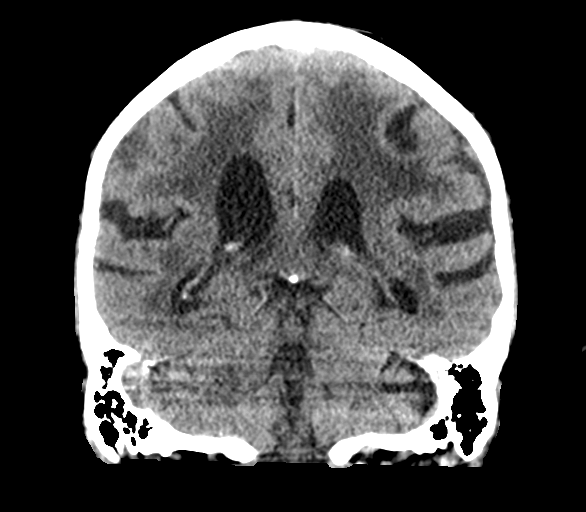
[im 33/73  brain]
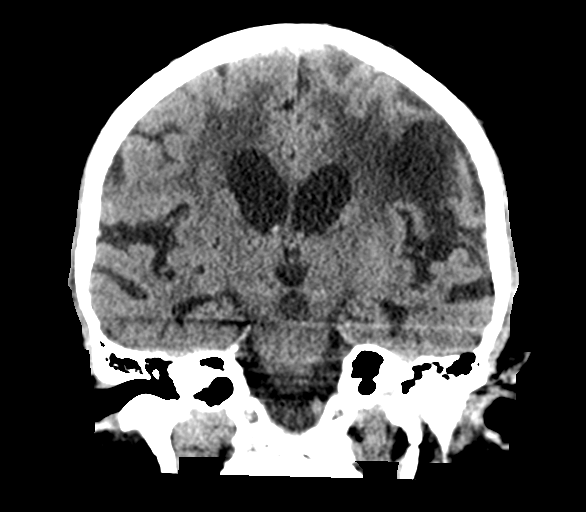
[im 41/73  brain]
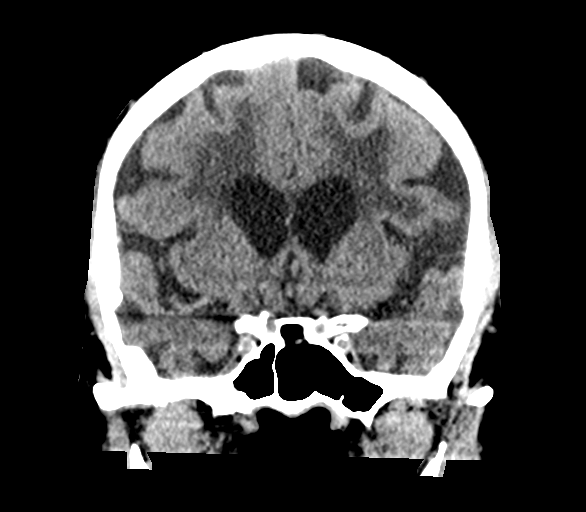

[Series 5: sagittal soft tissue · sagittal · 0.38mm/px · 3 of 58 slices shown]
[im 20/58  brain]
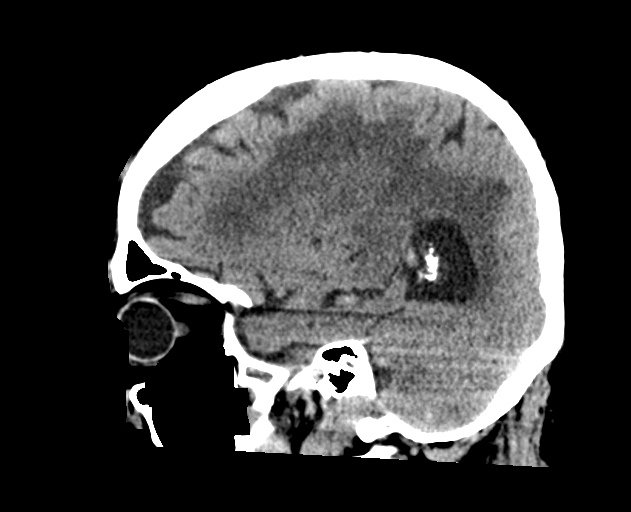
[im 29/58  brain]
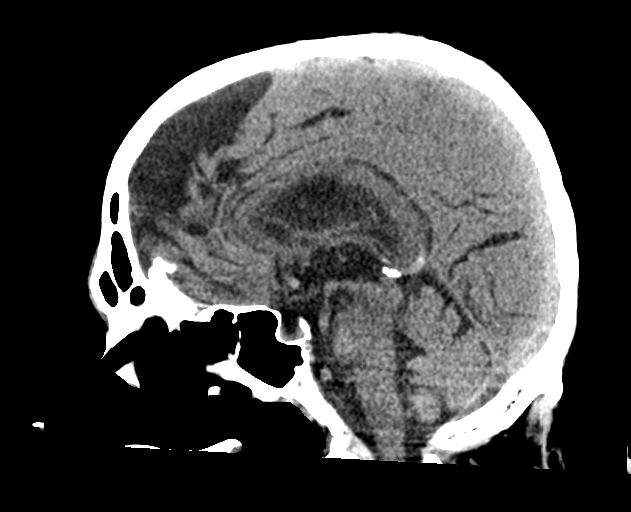
[im 39/58  brain]
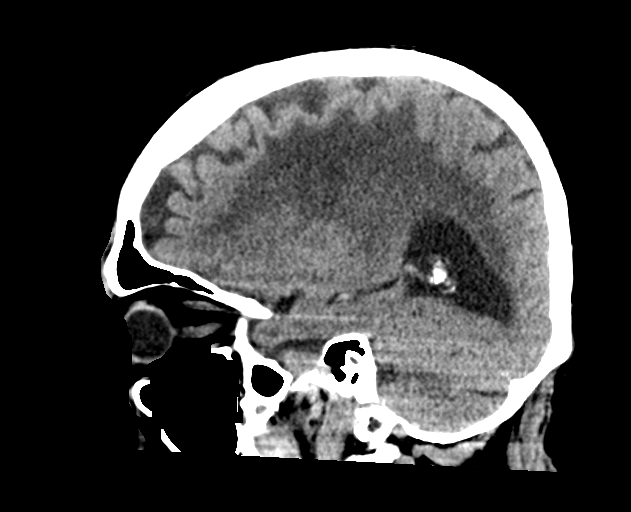

[16 of 47 positions shown; findings below may reference images not displayed]

FINDINGS: Brain: Moderate atrophy with progression. Negative for
hydrocephalus. Extensive white matter change bilaterally with
progression. Chronic infarct left frontal lobe shows restricted
diffusion on the MRI of 12/03/2013.

Negative for acute infarct, hemorrhage, mass.

Vascular: Negative for hyperdense vessel

Skull: Negative

Sinuses/Orbits: Mild mucosal edema paranasal sinuses. Bilateral
cataract extraction.

Other: None
IMPRESSION: No acute abnormality

Progression of atrophy and chronic ischemic changes in the white
matter. Chronic infarct in the left frontal lobe

## 2022-07-18 ENCOUNTER — Other Ambulatory Visit: Payer: Self-pay | Admitting: Internal Medicine

## 2022-07-19 DIAGNOSIS — H353211 Exudative age-related macular degeneration, right eye, with active choroidal neovascularization: Secondary | ICD-10-CM | POA: Diagnosis not present

## 2022-07-19 DIAGNOSIS — H40053 Ocular hypertension, bilateral: Secondary | ICD-10-CM | POA: Diagnosis not present

## 2022-07-19 DIAGNOSIS — M3501 Sicca syndrome with keratoconjunctivitis: Secondary | ICD-10-CM | POA: Diagnosis not present

## 2022-07-19 NOTE — Telephone Encounter (Signed)
Historical medication Last OV: 05/22/2022 Next OV: not scheduled

## 2022-08-01 ENCOUNTER — Encounter: Payer: Self-pay | Admitting: Internal Medicine

## 2022-08-01 ENCOUNTER — Other Ambulatory Visit: Payer: Self-pay | Admitting: Internal Medicine

## 2022-08-05 MED ORDER — TAMSULOSIN HCL 0.4 MG PO CAPS: 0.8000 mg | ORAL_CAPSULE | Freq: Every day | ORAL | 1 refills | Status: DC

## 2022-09-05 ENCOUNTER — Encounter: Payer: Self-pay | Admitting: Cardiovascular Disease

## 2022-09-05 ENCOUNTER — Ambulatory Visit: Payer: PPO | Attending: Cardiovascular Disease | Admitting: Cardiovascular Disease

## 2022-09-05 VITALS — BP 108/68 | HR 80 | Ht 70.0 in | Wt 180.6 lb

## 2022-09-05 DIAGNOSIS — I5022 Chronic systolic (congestive) heart failure: Secondary | ICD-10-CM

## 2022-09-05 DIAGNOSIS — I4821 Permanent atrial fibrillation: Secondary | ICD-10-CM | POA: Diagnosis not present

## 2022-09-05 DIAGNOSIS — I10 Essential (primary) hypertension: Secondary | ICD-10-CM

## 2022-09-05 NOTE — Progress Notes (Signed)
Cardiology Office Note   Date:  09/05/2022   ID:  Todd Glenn., DOB 07-29-1930, MRN [id number]  PCP:  Sherlene Shams, MD  Cardiologist:   Lorine Bears, MD   Chief Complaint  Patient presents with   Follow-up    Pt feels well today. Hx of afib. Meds reviewed.      History of Present Illness: Todd Lacerda. is a 89 y.o. male who presents for a follow-up visit regarding permanent atrial fibrillation and chronic systolic heart failure.  Other medical problems include previous stroke, hypertension, hyperlipidemia, and glaucoma.  In November 2015, he was admitted with expressive aphasia and mild ataxia due to left MCA stroke  in the setting of atrial fibrillation.  Echocardiogram showed normal LV function well carotid Doppler showed no obstructive disease.  He was placed on Eliquis.  He moved out of [place] with his wife and currently they live in [place].    He was taken off anticoagulation with Eliquis due to recurrent falls and dementia.    He was hospitalized in January, 2024 with a stroke.  He was hospitalized again in April with increased shortness of breath and was noted to be in A-fib with RVR with decompensated heart failure.  He improved with diuresis.  Echocardiogram showed an EF of 35 to 40%.  Reduced ejection fraction was felt to be due to tachycardia induced cardiomyopathy.  Rate control was difficult and ultimately he was given few doses of digoxin and amiodarone was added to Toprol.  His blood pressure was elevated and thus Cardura was added.  Hydrochlorothiazide was discontinued due to initiation of torsemide.  Amiodarone was subsequently discontinued.  Cardura was stopped due to orthostatic dizziness.   He has been doing well overall with no chest pain or worsening dyspnea.  He struggles to communicate due to aphasia.  His wife reports continued issues with falls.  Overall, he fell 8 times this year.   Past Medical History:  Diagnosis Date    Allergy    Arthritis    Asthma    Atrial fibrillation with RVR (HCC) 05/01/2022   Cataract    CHF (congestive heart failure) (HCC)    Chronic atrial fibrillation with rapid ventricular response (HCC) 04/30/2022   Chronic constipation    Colon polyps    Cough    GERD (gastroesophageal reflux disease)    Glaucoma    HOH (hard of hearing)    a. Uses hearing aids   Hyperlipidemia    Hypertension    Macular degeneration    Nephrolithiasis    stent   PAF (paroxysmal atrial fibrillation) (HCC)    a. 11/2013 Dx in setting of L MCA stroke. CHA2DS2VASc = 5-->Eliquis; b. 11/2013 Echo: Nl LV fxn. Mildly dil LA.   Stroke [facility])    a. 11/2013 L MCA stroke.   Ureteral stricture     Past Surgical History:  Procedure Laterality Date   CATARACT EXTRACTION W/PHACO Left 12/20/2014   Procedure: CATARACT EXTRACTION PHACO AND INTRAOCULAR LENS PLACEMENT (IOC);  Surgeon: Galen Manila, MD;  Location: [facility] ORS;  Service: Ophthalmology;  Laterality: Left;   [place]     00:59.6 AP%    21.2 CDE    12.66 fluid pack lot #[id number]   JOINT REPLACEMENT Left 2014   knee partial replacement   kidney stent Right 1995     Current Outpatient Medications  Medication Sig Dispense Refill   acetaminophen (TYLENOL) 325 MG tablet Take 2 tablets (650 mg total)  by mouth every 6 (six) hours as needed for mild pain (or Fever >/= 101).     aspirin EC 81 MG tablet Take 81 mg by mouth daily. Swallow whole.     cholecalciferol (VITAMIN D3) 25 MCG (1000 UNIT) tablet Take 1 tablet (1,000 Units total) by mouth daily. 30 tablet 0   Cyanocobalamin (B-12 PO) Take by mouth daily in the afternoon.     donepezil (ARICEPT) 10 MG tablet TAKE 1 TABLET(10 MG) BY MOUTH DAILY 90 tablet 1   latanoprost (XALATAN) 0.005 % ophthalmic solution Place 1 drop into both eyes at bedtime.     magnesium gluconate (MAGONATE) 500 MG tablet Take 0.5 tablets (250 mg total) by mouth at bedtime. 30 tablet 0   metoprolol succinate (TOPROL-XL) 100 MG 24 hr  tablet Take 1 tablet (100 mg total) by mouth daily. Take with or immediately following a meal. 90 tablet 1   Multiple Vitamins-Minerals (PRESERVISION AREDS 2 PO) Take by mouth 2 (two) times daily.     omeprazole (PRILOSEC) 40 MG capsule Take by mouth 1 capsule daily 90 capsule 3   rosuvastatin (CRESTOR) 10 MG tablet Take 1 tablet (10 mg total) by mouth daily. 90 tablet 1   sertraline (ZOLOFT) 50 MG tablet Take 1 tablet (50 mg total) by mouth daily. 30 tablet 0   sertraline (ZOLOFT) 50 MG tablet Take 1 tablet (50 mg total) by mouth daily. 90 tablet 1   tamsulosin (FLOMAX) 0.4 MG CAPS capsule Take 2 capsules (0.8 mg total) by mouth daily after supper. 180 capsule 1   torsemide (DEMADEX) 20 MG tablet Take 1 tablet (20 mg total) by mouth every other day. 45 tablet 1   l-methylfolate-B6-B12 (METANX) 3-35-2 MG TABS tablet Take 1 tablet by mouth daily. (Patient not taking: Reported on 09/05/2022) 30 tablet 0   Lactulose 20 GM/30ML SOLN 30 ml every 4 hours until constipation is relieved (Patient not taking: Reported on 09/05/2022) 437 mL 3   No current facility-administered medications for this visit.    Allergies:   Patient has no known allergies.    Social History:  The patient  reports that he has never smoked. He has never been exposed to tobacco smoke. He has never used smokeless tobacco. He reports current alcohol use. He reports that he does not use drugs.   Family History:  The patient's family history includes Arthritis in his mother; Cancer (age of onset: 55) in his mother; Stroke in his father.    ROS:  Please see the history of present illness.   Otherwise, review of systems are positive for none.   All other systems are reviewed and negative.    PHYSICAL EXAM: VS:  BP 108/68 (BP Location: Left Arm, Patient Position: Sitting, Cuff Size: Normal)   Pulse 80   Ht 5\' 10"  (1.778 m)   Wt 180 lb 9.6 oz (81.9 kg)   SpO2 99%   BMI 25.91 kg/m  , BMI Body mass index is 25.91 kg/m. GEN: Well  nourished, well developed, in no acute distress  HEENT: normal  Neck: no JVD, carotid bruits, or masses Cardiac: Irregularly irregular; no murmurs, rubs, or gallops,no edema  Respiratory:  clear to auscultation bilaterally, normal work of breathing GI: soft, nontender, nondistended, + BS MS: no deformity or atrophy  Skin: warm and dry, no rash Neuro:  Strength and sensation are intact Psych: euthymic mood, full affect   EKG:  EKG is ordered today. The ekg ordered today demonstrates : Atrial fibrillation with  premature ventricular or aberrantly conducted complexes Left anterior fascicular block Nonspecific ST and T wave abnormality Prolonged QT When compared with ECG of 08-May-2022 13:40, Left anterior fascicular block is now Present Nonspecific T wave abnormality, improved in Lateral leads   Recent Labs: 11/02/2021: TSH 1.54 03/20/2022: Magnesium 2.0 03/25/2022: ALT 29 04/30/2022: B Natriuretic Peptide 588.3; Hemoglobin 12.8; Platelets 231 05/22/2022: BUN 25; Creatinine, Ser 1.59; Potassium 4.2; Sodium 139    Lipid Panel    Component Value Date/Time   CHOL 165 03/16/2022 0448   CHOL 133 12/18/2020 0819   CHOL 168 12/04/2013 0549   TRIG 109 03/16/2022 0448   TRIG 96 12/04/2013 0549   HDL 50 03/16/2022 0448   HDL 55 12/18/2020 0819   HDL 44 12/04/2013 0549   CHOLHDL 3.3 03/16/2022 0448   VLDL 22 03/16/2022 0448   VLDL 19 12/04/2013 0549   LDLCALC 93 03/16/2022 0448   LDLCALC 60 12/18/2020 0819   LDLCALC 105 (H) 12/04/2013 0549      Wt Readings from Last 3 Encounters:  09/05/22 180 lb 9.6 oz (81.9 kg)  06/25/22 177 lb 3.2 oz (80.4 kg)  06/07/22 177 lb 6 oz (80.5 kg)            No data to display            ASSESSMENT AND PLAN:  1.  Permanent atrial fibrillation: Ventricular rate is controlled on Toprol 100 mg once daily.   He is still not a candidate for anticoagulation due to frequent falls and dementia.  He is not a candidate for Watchman device due to  age and comorbidities.  2.  Essential hypertension: His blood pressure is controlled on Toprol.  3.  Chronic systolic heart failure: Moderately reduced LV systolic function likely tachycardia induced cardiomyopathy.  He appears to be euvolemic on torsemide 20 mg every other day.  Blood pressure is too low to allow ACE inhibitor or ARB.    Disposition:   FU with me in 6 months.  Signed,  Lorine Bears, MD  09/05/2022 8:58 AM    [facility]

## 2022-09-05 NOTE — Patient Instructions (Signed)
Follow-Up: At [facility], you and your health needs are our priority.  As part of our continuing mission to provide you with exceptional heart care, we have created designated Provider Care Teams.  These Care Teams include your primary Cardiologist (physician) and Advanced Practice Providers (APPs -  Physician Assistants and Nurse Practitioners) who all work together to provide you with the care you need, when you need it.  We recommend signing up for the patient portal called "MyChart".  Sign up information is provided on this After Visit Summary.  MyChart is used to connect with patients for Virtual Visits (Telemedicine).  Patients are able to view lab/test results, encounter notes, upcoming appointments, etc.  Non-urgent messages can be sent to your provider as well.   To learn more about what you can do with MyChart, go to [email or web address].    Your next appointment:   6 month(s)  Provider:   You may see Lorine Bears, MD or one of the following Advanced Practice Providers on your designated Care Team:   Nicolasa Ducking, NP Eula Listen, PA-C Cadence Fransico Michael, PA-C Charlsie Quest, NP

## 2022-10-11 DIAGNOSIS — H353211 Exudative age-related macular degeneration, right eye, with active choroidal neovascularization: Secondary | ICD-10-CM | POA: Diagnosis not present

## 2022-10-22 ENCOUNTER — Ambulatory Visit: Payer: PPO | Admitting: Podiatry

## 2022-10-22 ENCOUNTER — Encounter: Payer: Self-pay | Admitting: Podiatry

## 2022-10-22 DIAGNOSIS — M79675 Pain in left toe(s): Secondary | ICD-10-CM

## 2022-10-22 DIAGNOSIS — L600 Ingrowing nail: Secondary | ICD-10-CM

## 2022-10-22 DIAGNOSIS — B351 Tinea unguium: Secondary | ICD-10-CM | POA: Diagnosis not present

## 2022-10-22 DIAGNOSIS — M79674 Pain in right toe(s): Secondary | ICD-10-CM

## 2022-10-22 DIAGNOSIS — L84 Corns and callosities: Secondary | ICD-10-CM

## 2022-10-22 DIAGNOSIS — M2041 Other hammer toe(s) (acquired), right foot: Secondary | ICD-10-CM | POA: Diagnosis not present

## 2022-10-22 NOTE — Patient Instructions (Signed)

## 2022-10-24 NOTE — Progress Notes (Signed)
Subjective:  Patient ID: Todd Golden., male    DOB: [birth year],  MRN: [id number]  Chief Complaint  Patient presents with   Nail Problem    Right hallux nail pain with loose nail, pain where 1st two toes press against each other    89 y.o. male presents with the above complaint. History confirmed with patient.  Left side is doing better than most of the pain in the right great toe which seems to be around the nail he also has a large callus on the tip of the third toe  Objective:  Physical Exam: warm, good capillary refill, no trophic changes or ulcerative lesions, normal DP and PT pulses, normal sensory exam, and dystrophic mycotic pincer nail deformity on the right great toenail with significant subungual debris causing pain with palpation, digital hammertoes noted, all toenails have mycosis subungual debris thickening and dystrophy.  Callus at tip of third toe right tender to touch.  Assessment:   1. Ingrowing right great toenail   2. Callus of foot   3. Hammertoe of right foot   4. Pain due to onychomycosis of toenails of both feet      Plan:  Patient was evaluated and treated and all questions answered.  He has multiple issues including hammertoes calluses and pain due to his onychomycosis as well as ingrown nail on the right.  I debrided the ingrown nail in a slant back fashion this did require a digital block to anesthetize the hallux and the deeper tissues.  I recommended debridement of the remaining nail plates due to the significant thickening mycosis and his wife is unable to cut them.  This was completed on all nails, reducing them in length and thickness with a sharp nail nipper to a comfortable level.  He will return for this on a regular routine schedule with [place].  He also has hammertoe deformities and a painful callus developing on the tip of the third toe.  I discussed treatment options for this including surgical and nonsurgical treatment and a toe crest pad was  dispensed.  Recommended follow-up with me as needed for this if it does not improve we could consider flexor tenotomy here as well.  Return in about 3 months (around 01/22/2023) for painful thick fungal nails.

## 2022-12-06 ENCOUNTER — Encounter: Payer: PPO | Admitting: Internal Medicine

## 2022-12-27 ENCOUNTER — Other Ambulatory Visit: Payer: Self-pay | Admitting: Internal Medicine

## 2023-01-01 ENCOUNTER — Encounter: Payer: Self-pay | Admitting: Internal Medicine

## 2023-01-01 ENCOUNTER — Ambulatory Visit: Payer: PPO | Admitting: Internal Medicine

## 2023-01-01 VITALS — BP 126/78 | HR 63 | Ht 70.0 in | Wt 184.0 lb

## 2023-01-01 DIAGNOSIS — F0153 Vascular dementia, unspecified severity, with mood disturbance: Secondary | ICD-10-CM

## 2023-01-01 DIAGNOSIS — Z0001 Encounter for general adult medical examination with abnormal findings: Secondary | ICD-10-CM | POA: Diagnosis not present

## 2023-01-01 DIAGNOSIS — R7303 Prediabetes: Secondary | ICD-10-CM

## 2023-01-01 DIAGNOSIS — R5383 Other fatigue: Secondary | ICD-10-CM | POA: Diagnosis not present

## 2023-01-01 DIAGNOSIS — I679 Cerebrovascular disease, unspecified: Secondary | ICD-10-CM | POA: Diagnosis not present

## 2023-01-01 DIAGNOSIS — R1310 Dysphagia, unspecified: Secondary | ICD-10-CM

## 2023-01-01 DIAGNOSIS — R413 Other amnesia: Secondary | ICD-10-CM

## 2023-01-01 DIAGNOSIS — I10 Essential (primary) hypertension: Secondary | ICD-10-CM | POA: Diagnosis not present

## 2023-01-01 DIAGNOSIS — R29898 Other symptoms and signs involving the musculoskeletal system: Secondary | ICD-10-CM | POA: Diagnosis not present

## 2023-01-01 DIAGNOSIS — E785 Hyperlipidemia, unspecified: Secondary | ICD-10-CM | POA: Diagnosis not present

## 2023-01-01 DIAGNOSIS — R4701 Aphasia: Secondary | ICD-10-CM | POA: Diagnosis not present

## 2023-01-01 DIAGNOSIS — I480 Paroxysmal atrial fibrillation: Secondary | ICD-10-CM | POA: Diagnosis not present

## 2023-01-01 DIAGNOSIS — G319 Degenerative disease of nervous system, unspecified: Secondary | ICD-10-CM | POA: Diagnosis not present

## 2023-01-01 DIAGNOSIS — Z8673 Personal history of transient ischemic attack (TIA), and cerebral infarction without residual deficits: Secondary | ICD-10-CM

## 2023-01-01 DIAGNOSIS — Z0000 Encounter for general adult medical examination without abnormal findings: Secondary | ICD-10-CM

## 2023-01-01 LAB — CBC WITH DIFFERENTIAL/PLATELET
Basophils Absolute: 0 10*3/uL (ref 0.0–0.1)
Basophils Relative: 0.3 % (ref 0.0–3.0)
Eosinophils Absolute: 0.3 10*3/uL (ref 0.0–0.7)
Eosinophils Relative: 3.4 % (ref 0.0–5.0)
HCT: 39.1 % (ref 39.0–52.0)
Hemoglobin: 12.8 g/dL — ABNORMAL LOW (ref 13.0–17.0)
Lymphocytes Relative: 18.8 % (ref 12.0–46.0)
Lymphs Abs: 1.5 10*3/uL (ref 0.7–4.0)
MCHC: 32.8 g/dL (ref 30.0–36.0)
MCV: 92.6 fL (ref 78.0–100.0)
Monocytes Absolute: 0.9 10*3/uL (ref 0.1–1.0)
Monocytes Relative: 11.9 % (ref 3.0–12.0)
Neutro Abs: 5.2 10*3/uL (ref 1.4–7.7)
Neutrophils Relative %: 65.6 % (ref 43.0–77.0)
Platelets: 225 10*3/uL (ref 150.0–400.0)
RBC: 4.22 Mil/uL (ref 4.22–5.81)
RDW: 14.3 % (ref 11.5–15.5)
WBC: 7.9 10*3/uL (ref 4.0–10.5)

## 2023-01-01 LAB — COMPREHENSIVE METABOLIC PANEL
ALT: 20 U/L (ref 0–53)
AST: 20 U/L (ref 0–37)
Albumin: 4 g/dL (ref 3.5–5.2)
Alkaline Phosphatase: 65 U/L (ref 39–117)
BUN: 21 mg/dL (ref 6–23)
CO2: 30 meq/L (ref 19–32)
Calcium: 9.1 mg/dL (ref 8.4–10.5)
Chloride: 101 meq/L (ref 96–112)
Creatinine, Ser: 1.26 mg/dL (ref 0.40–1.50)
GFR: 50.22 mL/min — ABNORMAL LOW (ref >60.00)
Glucose, Bld: 104 mg/dL — ABNORMAL HIGH (ref 70–99)
Potassium: 3.9 meq/L (ref 3.5–5.1)
Sodium: 141 meq/L (ref 135–145)
Total Bilirubin: 0.8 mg/dL (ref 0.2–1.2)
Total Protein: 6.7 g/dL (ref 6.0–8.3)

## 2023-01-01 LAB — LIPID PANEL
Cholesterol: 135 mg/dL (ref 0–200)
HDL: 40.6 mg/dL (ref >39.00)
LDL Cholesterol: 73 mg/dL (ref 0–99)
NonHDL: 94.23
Total CHOL/HDL Ratio: 3
Triglycerides: 108 mg/dL (ref 0.0–149.0)
VLDL: 21.6 mg/dL (ref 0.0–40.0)

## 2023-01-01 LAB — HEMOGLOBIN A1C: Hgb A1c MFr Bld: 6.2 % (ref 4.6–6.5)

## 2023-01-01 LAB — TSH: TSH: 1.42 u[IU]/mL (ref 0.35–5.50)

## 2023-01-01 LAB — LDL CHOLESTEROL, DIRECT: Direct LDL: 74 mg/dL

## 2023-01-01 MED ORDER — ROSUVASTATIN CALCIUM 10 MG PO TABS: 10.0000 mg | ORAL_TABLET | Freq: Every day | ORAL | 1 refills | Status: DC

## 2023-01-01 MED ORDER — TAMSULOSIN HCL 0.4 MG PO CAPS: 0.8000 mg | ORAL_CAPSULE | Freq: Every day | ORAL | 1 refills | Status: DC

## 2023-01-01 MED ORDER — TORSEMIDE 20 MG PO TABS: 20.0000 mg | ORAL_TABLET | ORAL | 1 refills | Status: DC

## 2023-01-01 MED ORDER — DONEPEZIL HCL 10 MG PO TABS: ORAL_TABLET | ORAL | 1 refills | Status: DC

## 2023-01-01 NOTE — Assessment & Plan Note (Signed)
age appropriate education and counseling updated, referrals for preventative services and immunizations addressed, dietary and smoking counseling addressed, most recent labs reviewed.  I have personally reviewed and have noted:   1) the patient's medical and social history 2) The pt's use of alcohol, tobacco, and illicit drugs 3) The patient's current medications and supplements 4) Functional ability including ADL's, fall risk, home safety risk, hearing and visual impairment.  He requires supervision and a walker  5) Diet and physical activities: low GI, low salt;  sedentary  6) Evidence for depression or mood disorder:  some symptoms largely due to his disability ,  but he denies depression and defers treatment  7) The patient's height, weight, and BMI have been recorded in the chart   I have made referrals, and provided counseling and education based on review of the above

## 2023-01-01 NOTE — Assessment & Plan Note (Signed)
Currently rate controlled on toprol XL 100 mg daily,  amiodarone not tolerated

## 2023-01-01 NOTE — Progress Notes (Unsigned)
Patient ID: Todd Glenn., male    DOB: 02-05-1930  Age: 89 y.o. MRN: [id number]  The patient is here for annual preventive examination and management of other chronic and acute problems.   The risk factors are reflected in the social history.   The roster of all physicians providing medical care to patient - is listed in the Snapshot section of the chart.   Activities of daily living:  The patient is 100% independent in all ADLs: dressing, toileting, feeding as well as independent mobility   Home safety : The patient has smoke detectors in the home. They wear seatbelts.  There are no unsecured firearms at home. There is no violence in the home.    There is no risks for hepatitis, STDs or HIV. There is no   history of blood transfusion. They have no travel history to infectious disease endemic areas of the world.   The patient has seen their dentist in the last six month. They have seen their eye doctor in the last year. The patinet  denies slight hearing difficulty with regard to whispered voices and some television programs.  They have deferred audiologic testing in the last year.  They do not  have excessive sun exposure. Discussed the need for sun protection: hats, long sleeves and use of sunscreen if there is significant sun exposure.    Diet: the importance of a healthy diet is discussed. They do have a healthy diet.   The benefits of regular aerobic exercise were discussed. The patient  exercises  3 to 5 days per week  for  60 minutes.    Depression screen: there are no signs or vegative symptoms of depression- irritability, change in appetite, anhedonia, sadness/tearfullness.   The following portions of the patient's history were reviewed and updated as appropriate: allergies, current medications, past family history, past medical history,  past surgical history, past social history  and problem list.   Visual acuity was not assessed per patient preference since the patient has  regular follow up with an  ophthalmologist. Hearing and body mass index were assessed and reviewed.    During the course of the visit the patient was educated and counseled about appropriate screening and preventive services including : fall prevention , diabetes screening, nutrition counseling, colorectal cancer screening, and recommended immunizations.    Chief Complaint:  History of fall: patient fell in the parking lot on the way in to the building today.  He was trying to ambulate without his walker.  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  Has become quite sedentary,  spends each day in front of the TV   HTN:  Hypertension: patient checks blood pressure twice weekly at home.  Readings have been for the most part <130/80 at rest . Patient is following a reduced salt diet most days and is taking medications as prescribed       Review of Symptoms  Patient denies headache, fevers, malaise, unintentional weight loss, skin rash, eye pain, sinus congestion and sinus pain, sore throat, dysphagia,  hemoptysis , cough, dyspnea, wheezing, chest pain, palpitations, orthopnea, edema, abdominal pain, nausea, melena, diarrhea, constipation, flank pain, dysuria, hematuria, urinary  Frequency, nocturia, numbness, tingling, seizures,  Focal weakness, Loss of consciousness,  Tremor, insomnia, depression, anxiety, and suicidal ideation.    Physical Exam:  BP 126/78   Pulse 63   Ht 5\' 10"  (  1.778 m)   Wt 184 lb (83.5 kg)   SpO2 98%   BMI 26.40 kg/m    Physical Exam Vitals reviewed.  Constitutional:      General: He is not in acute distress.    Appearance: Normal appearance. He is normal weight. He is not ill-appearing, toxic-appearing or diaphoretic.  HENT:     Head: Normocephalic and atraumatic.     Right Ear: Tympanic membrane, ear canal and external ear normal. There is no impacted cerumen.      Left Ear: Tympanic membrane, ear canal and external ear normal. There is no impacted cerumen.     Nose: Nose normal.     Mouth/Throat:     Mouth: Mucous membranes are moist.     Pharynx: Oropharynx is clear.  Eyes:     General: No scleral icterus.       Right eye: No discharge.        Left eye: No discharge.     Conjunctiva/sclera: Conjunctivae normal.  Neck:     Thyroid: No thyromegaly.     Vascular: No carotid bruit or JVD.  Cardiovascular:     Rate and Rhythm: Normal rate and regular rhythm.     Heart sounds: Normal heart sounds.  Pulmonary:     Effort: Pulmonary effort is normal. No respiratory distress.     Breath sounds: Normal breath sounds.  Abdominal:     General: Bowel sounds are normal.     Palpations: Abdomen is soft. There is no mass.     Tenderness: There is no abdominal tenderness. There is no guarding or rebound.  Musculoskeletal:        General: Normal range of motion.     Cervical back: Normal range of motion and neck supple.  Lymphadenopathy:     Cervical: No cervical adenopathy.  Skin:    General: Skin is warm and dry.  Neurological:     Mental Status: He is alert and oriented to person, place, and time. Mental status is at baseline.     Cranial Nerves: Cranial nerve deficit present.     Sensory: Sensation is intact.     Motor: Weakness present.     Coordination: Coordination is intact.     Gait: Gait abnormal.     Comments: Expressive aphasia  Bilateral leg weakness   Psychiatric:        Mood and Affect: Mood normal.        Behavior: Behavior normal.        Thought Content: Thought content normal.        Judgment: Judgment normal.    Assessment and Plan: Essential hypertension, benign -     Comprehensive metabolic panel  Dyslipidemia -     Lipid panel -     LDL cholesterol, direct  Prediabetes Assessment & Plan: He is following a low GI diet. .   Lab Results  Component Value Date   HGBA1C 5.6 03/16/2022     Orders: -      Comprehensive metabolic panel -     Hemoglobin A1c  Other fatigue -     CBC with Differential/Platelet -     TSH  Proximal leg weakness -     AMB Referral VBCI Care Management  Memory loss, short term -     AMB Referral VBCI Care Management  Paroxysmal atrial fibrillation [facility]) Assessment & Plan: Currently rate controlled on toprol XL 100 mg daily,  amiodarone not tolerated    Encounter for preventive health  examination Assessment & Plan: age appropriate education and counseling updated, referrals for preventative services and immunizations addressed, dietary and smoking counseling addressed, most recent labs reviewed.  I have personally reviewed and have noted:   1) the patient's medical and social history 2) The pt's use of alcohol, tobacco, and illicit drugs 3) The patient's current medications and supplements 4) Functional ability including ADL's, fall risk, home safety risk, hearing and visual impairment.  He requires supervision and a walker  5) Diet and physical activities: low GI, low salt;  sedentary  6) Evidence for depression or mood disorder:  some symptoms largely due to his disability ,  but he denies depression and defers treatment  7) The patient's height, weight, and BMI have been recorded in the chart   I have made referrals, and provided counseling and education based on review of the above    Other orders -     Donepezil HCl; TAKE 1 TABLET(10 MG) BY MOUTH DAILY  Dispense: 90 tablet; Refill: 1 -     Rosuvastatin Calcium; Take 1 tablet (10 mg total) by mouth daily.  Dispense: 90 tablet; Refill: 1 -     Tamsulosin HCl; Take 2 capsules (0.8 mg total) by mouth daily after supper.  Dispense: 180 capsule; Refill: 1 -     Torsemide; Take 1 tablet (20 mg total) by mouth every other day.  Dispense: 45 tablet; Refill: 1    No follow-ups on file.  Sherlene Shams, MD

## 2023-01-01 NOTE — Patient Instructions (Addendum)
If Todd Golden gets  sore  tonight,  he can use  1000 mg tylenol every 12 hours    I am placing a referral for social worker to see if we can provide any assistance at home

## 2023-01-01 NOTE — Assessment & Plan Note (Signed)
He is following a low GI diet. .   Lab Results  Component Value Date   HGBA1C 5.6 03/16/2022

## 2023-01-02 ENCOUNTER — Telehealth: Payer: Self-pay | Admitting: *Deleted

## 2023-01-02 DIAGNOSIS — Z0000 Encounter for general adult medical examination without abnormal findings: Secondary | ICD-10-CM | POA: Insufficient documentation

## 2023-01-02 DIAGNOSIS — Z8673 Personal history of transient ischemic attack (TIA), and cerebral infarction without residual deficits: Secondary | ICD-10-CM | POA: Insufficient documentation

## 2023-01-02 NOTE — Assessment & Plan Note (Signed)
Managed with Aricept and Zoloft.

## 2023-01-02 NOTE — Assessment & Plan Note (Signed)
Chronic, secondary to remote CVA/ He requires use of a rolling walker with seat for all ambulation,  as demonstrated today when he fell after walking unassisted from the car to the curb.

## 2023-01-02 NOTE — Assessment & Plan Note (Signed)
Secondary to  Embolic CVA and cerebrovascular disease,  With mild dementia

## 2023-01-02 NOTE — Progress Notes (Signed)
  Care Coordination   Note   01/02/2023 Name: Todd Golden. MRN: [id number] DOB: 12-11-1930  Todd Golden. is a 89 y.o. year old male who sees Todd Golden, Todd Daring, MD for primary care. I reached out to Todd Golden. by phone today to offer care coordination services.  Todd Golden was given information about Care Coordination services today including:   The Care Coordination services include support from the care team which includes your Nurse Coordinator, Clinical Social Worker, or Pharmacist.  The Care Coordination team is here to help remove barriers to the health concerns and goals most important to you. Care Coordination services are voluntary, and the patient may decline or stop services at any time by request to their care team member.   Care Coordination Consent Status: Patient agreed to services and verbal consent obtained.   Follow up plan:  Telephone appointment with care coordination team member scheduled for:  01/06/2023  Encounter Outcome:  Patient Scheduled  Todd Golden, [facility] Care Coordination Care Guide Direct Dial: [phone]

## 2023-01-02 NOTE — Assessment & Plan Note (Signed)
secondary to CVA (remote),  he has had a formal swallow evaluation to rule out structural anomalies.  He has no cough with eating

## 2023-01-02 NOTE — Assessment & Plan Note (Signed)
Broca's aphasia secondary to CVA. He has had increased difficulty with  His speech over the last several weeks

## 2023-01-02 NOTE — Assessment & Plan Note (Signed)
He has a gait disorder,  right leg weakness  Broca;s aphasia and mild vascular dementia.  His wife manages all affairs at [employer] and has been unable to procure community resources (ie, Corporate treasurer) to provide respite hours for herself . Care mgmt consult offered and ACCEPTED

## 2023-01-02 NOTE — Assessment & Plan Note (Addendum)
Continue rosuvastatin  .  Lab Results  Component Value Date   CHOL 135 01/01/2023   HDL 40.60 01/01/2023   LDLCALC 73 01/01/2023   LDLDIRECT 74.0 01/01/2023   TRIG 108.0 01/01/2023   CHOLHDL 3 01/01/2023

## 2023-01-03 DIAGNOSIS — H353211 Exudative age-related macular degeneration, right eye, with active choroidal neovascularization: Secondary | ICD-10-CM | POA: Diagnosis not present

## 2023-01-06 ENCOUNTER — Ambulatory Visit: Payer: Self-pay | Admitting: *Deleted

## 2023-01-06 NOTE — Patient Instructions (Signed)
Visit Information  Thank you for taking time to visit with me today. Please don't hesitate to contact me if I can be of assistance to you.   Following are the goals we discussed today:   Goals Addressed             This Visit's Progress    Respite care options       Activities and task to complete in order to accomplish goals.   TASK TO ACCOMPLISH GOAL Call [facility] to complete referral-specifically for companion care  service [phone]         Our next appointment is by telephone on 01/17/23 at 10am  Please call the care guide team at [phone] if you need to cancel or reschedule your appointment.   If you are experiencing a Mental Health or Behavioral Health Crisis or need someone to talk to, please call 911   Patient verbalizes understanding of instructions and care plan provided today and agrees to view in MyChart. Active MyChart status and patient understanding of how to access instructions and care plan via MyChart confirmed with patient.     Telephone follow up appointment with care management team member scheduled for: 01/17/23  Verna Czech, LCSW [place]  [facility], [facility] Licensed Clinical Social Worker Care Coordinator  Direct Dial: [phone]

## 2023-01-06 NOTE — Patient Outreach (Addendum)
  Care Coordination   Initial Visit Note   01/06/2023 Name: Rollan Elsenpeter. MRN: [id number] DOB: 04/01/1930  [name] Lothar Pruyn. is a 89 y.o. year old male who sees Darrick Huntsman, Mar Daring, MD for primary care. I spoke with  [name] 's spouse Aurea Graff by phone today.  What matters to the patients health and wellness today?  Respite care options   Goals Addressed             This Visit's Progress    Respite care options       Activities and task to complete in order to accomplish goals.   TASK TO ACCOMPLISH GOAL Call [facility] to complete referral-specifically for companion care  service [phone]         SDOH assessments and interventions completed:  Yes  SDOH Interventions Today    Flowsheet Row Most Recent Value  SDOH Interventions   Food Insecurity Interventions Intervention Not Indicated  Housing Interventions Intervention Not Indicated  Transportation Interventions Intervention Not Indicated        Care Coordination Interventions:  Yes, provided  Interventions Today    Flowsheet Row Most Recent Value  Chronic Disease   Chronic disease during today's visit Other  [leg weakness, memory loss short term]  General Interventions   General Interventions Discussed/Reviewed General Interventions Discussed, [employer], Level of Care, Communication with  [Patient's spouse requesting assistance with respite care optiona for patient]  Communication with --  [[facility] contacted -eligibility discussed-confirmed that patient's spouse would need to call to complete the referral]  Level of Care Personal Care Services  [confirmed that patient has a housekeeper that comes in 2x per mo. however spouse takes care of patient's hands on care needs. Confirmed that pt remains on waiting list for respite care with [facility]-encouraged referral to [facility]]  Education Interventions   Education Provided Provided Education  Provided Verbal Education On  [employer], Other  [[facility] (5 services available)  Transp, contactless delivery services, light housekeeping, tech. asst, and companionship service-confirmed that they provide" no hands on" services]       Follow up plan: Follow up call scheduled for 01/17/23    Encounter Outcome:  Patient Visit Completed

## 2023-01-17 ENCOUNTER — Telehealth: Payer: Self-pay | Admitting: *Deleted

## 2023-01-17 ENCOUNTER — Encounter: Payer: Self-pay | Admitting: *Deleted

## 2023-01-17 NOTE — Patient Outreach (Signed)
  Care Coordination   01/17/2023 Name: Todd Golden. MRN: [id number] DOB: April 28, 1930   Care Coordination Outreach Attempts:  An unsuccessful telephone outreach was attempted today to offer the patient information about available complex care management services.  Follow Up Plan:  Additional outreach attempts will be made to offer the patient complex care management information and services.   Encounter Outcome:  No Answer   Care Coordination Interventions:  No, not indicated     Samier Jaco, LCSW [place]  [facility], [facility] Licensed Clinical Social Worker Care Coordinator  Direct Dial: [phone]

## 2023-01-20 ENCOUNTER — Ambulatory Visit: Payer: Self-pay | Admitting: *Deleted

## 2023-01-20 NOTE — Patient Outreach (Addendum)
  Care Coordination   Follow Up Visit Note   01/20/2023 Name: Todd Golden. MRN: [id number] DOB: [birth year]  Todd Golden Todd Golden. is a 89 y.o. year old male who sees Darrick Huntsman, Mar Daring, MD for primary care. I spoke with  [name] 's spouse by phone today.  What matters to the patients health and wellness today?  Respite  and companionship options for patient suport     Goals Addressed             This Visit's Progress    Respite care options       Activities and task to complete in order to accomplish goals.   TASK TO ACCOMPLISH GOAL Call [facility] to complete referral-specifically for companion care  service [phone] Please follow up with status of referral for respite care through [facility] [phone]          SDOH assessments and interventions completed:  No     Care Coordination Interventions:  Yes, provided  Interventions Today    Flowsheet Row Most Recent Value  Chronic Disease   Chronic disease during today's visit --  [leg weakness, memory loss short term]  General Interventions   General Interventions Discussed/Reviewed General Interventions Reviewed, Community Resources  [-follow up call to patient's spouse to discuss respite care and referral to [employer] for companion care-pt remains in waiting list for respite care through [facility]]  Level of Care Personal Care Services  [patient's spouse discussed plan to contact [facility] to complete referral [phone] Spouse agreeable to contact [facility] after the holiday for follow up-spouse continues to have housekeeper 2x per month]       Follow up plan: No further intervention required. Patient's spouse agreeable to contacting this [facility] companion care to complete referral for services. She will contact this Child psychotherapist with any additional follow up needs  Encounter Outcome:  Patient Visit Completed

## 2023-01-20 NOTE — Progress Notes (Signed)
This encounter was created in error - please disregard.

## 2023-01-20 NOTE — Patient Instructions (Signed)
Visit Information  Thank you for taking time to visit with me today. Please don't hesitate to contact me if I can be of assistance to you.   Following are the goals we discussed today:   Goals Addressed             This Visit's Progress    Respite care options       Activities and task to complete in order to accomplish goals.   TASK TO ACCOMPLISH GOAL Call [facility] to complete referral-specifically for companion care  service [phone] Please follow up with status of referral for respite care through [facility] [phone]           If you are experiencing a Mental Health or Behavioral Health Crisis or need someone to talk to, please call 911   Patient verbalizes understanding of instructions and care plan provided today and agrees to view in MyChart. Active MyChart status and patient understanding of how to access instructions and care plan via MyChart confirmed with patient.     No further follow up required: patient to contact this social with any additional questions or concerns related to [employer] companion services.  Crosby Oriordan, LCSW [place]  [facility], [facility] Licensed Clinical Social Worker Care Coordinator  Direct Dial: [phone]

## 2023-01-23 ENCOUNTER — Ambulatory Visit: Payer: PPO | Admitting: Podiatry

## 2023-01-23 ENCOUNTER — Encounter: Payer: Self-pay | Admitting: Podiatry

## 2023-01-23 DIAGNOSIS — L84 Corns and callosities: Secondary | ICD-10-CM

## 2023-01-23 DIAGNOSIS — D689 Coagulation defect, unspecified: Secondary | ICD-10-CM

## 2023-01-23 DIAGNOSIS — B351 Tinea unguium: Secondary | ICD-10-CM

## 2023-01-23 DIAGNOSIS — M79674 Pain in right toe(s): Secondary | ICD-10-CM

## 2023-01-23 DIAGNOSIS — M79675 Pain in left toe(s): Secondary | ICD-10-CM | POA: Diagnosis not present

## 2023-01-27 NOTE — Progress Notes (Signed)
  Subjective:  Patient ID: Todd Golden., male    DOB: 04/30/1930,  MRN: [id number]  89 y.o. male presents to clinic with  corn(s) right foot and painful thick toenails that are difficult to trim. Painful toenails interfere with ambulation. Aggravating factors include wearing enclosed shoe gear. Pain is relieved with periodic professional debridement. Painful corns are aggravated when weightbearing when wearing enclosed shoe gear. Pain is relieved with periodic professional debridement.   Patient has h/o CVA; alos h/o acquired thrombophilia. Eliquis  was discontinued due to falls. Patient is accompanied by his wife on today's visit.  New problem(s): None   PCP is [name], MD.  No Known Allergies  Review of Systems: Negative except as noted in the HPI.   Objective:  Akhil Piscopo. is a pleasant 89 y.o. male in NAD. AAO x 3.  Vascular Examination: Vascular status intact b/l with palpable pedal pulses. CFT immediate b/l. No edema. No pain with calf compression b/l. Skin temperature gradient WNL b/l.   Neurological Examination: Sensation grossly intact b/l with 10 gram monofilament.   Dermatological Examination: Pedal skin with normal turgor, texture and tone b/l. Toenails 1-5 b/l thick, discolored, elongated with subungual debris and pain on dorsal palpation.Hyperkeratotic lesion(s) distal tip of right 3rd toe.  No erythema, no edema, no drainage, no fluctuance.  Musculoskeletal Examination: Muscle strength 5/5 to b/l LE. Hallux hammertoe noted b/l LE. Clawtoe deformity R 3rd toe.  Radiographs: None  Last A1c:      Latest Ref Rng & Units 01/01/2023    9:58 AM 03/16/2022    4:45 AM  Hemoglobin A1C  Hemoglobin-A1c 4.6 - 6.5 % 6.2  5.6      Assessment:   1. Pain due to onychomycosis of toenails of both feet   2. Corns and callosities   3. Clotting disorder (HCC)    Plan:  -Patient's family member present. All questions/concerns addressed on today's  visit. -Examined patient. -Patient to continue soft, supportive shoe gear daily. -Toenails 1-5 b/l were debrided in length and girth with sterile nail nippers and dremel without iatrogenic bleeding.  -Hyperkeratotic lesion(s) R 3rd toe debrided with dremel bur without incident. Total number debrided=1. -Patient/POA to call should there be question/concern in the interim.  Return in about 3 months (around 04/23/2023).  Delon CROME Merlin, DPM      [place] LOCATION: [address] [place] [address]                   Office [phone]   [facility] LOCATION: [address] [place], [place] [address] Office [phone]

## 2023-02-17 ENCOUNTER — Ambulatory Visit: Payer: PPO | Admitting: Internal Medicine

## 2023-02-17 ENCOUNTER — Encounter: Payer: Self-pay | Admitting: Internal Medicine

## 2023-02-17 ENCOUNTER — Ambulatory Visit: Payer: PPO

## 2023-02-17 VITALS — BP 112/70 | HR 62 | Temp 97.8°F | Ht 70.0 in | Wt 184.4 lb

## 2023-02-17 DIAGNOSIS — R109 Unspecified abdominal pain: Secondary | ICD-10-CM | POA: Diagnosis not present

## 2023-02-17 DIAGNOSIS — Y92009 Unspecified place in unspecified non-institutional (private) residence as the place of occurrence of the external cause: Secondary | ICD-10-CM

## 2023-02-17 DIAGNOSIS — W19XXXA Unspecified fall, initial encounter: Secondary | ICD-10-CM

## 2023-02-17 DIAGNOSIS — R10811 Right upper quadrant abdominal tenderness: Secondary | ICD-10-CM

## 2023-02-17 DIAGNOSIS — M25551 Pain in right hip: Secondary | ICD-10-CM | POA: Diagnosis not present

## 2023-02-17 DIAGNOSIS — M1611 Unilateral primary osteoarthritis, right hip: Secondary | ICD-10-CM | POA: Diagnosis not present

## 2023-02-17 NOTE — Progress Notes (Unsigned)
Acute Office Visit  Subjective:     Patient ID: Todd Velaquez., male    DOB: [birth year], 89 y.o.   MRN: [id number]  Chief Complaint  Patient presents with   Acute Visit    Larey Seat last week and has had pain 10/10 in his right side of back and right hip    HPI Patient is in today for a fall.  Patient has difficulty speaking after his CVA.  History obtained from patient and his wife.  He states that he fell last Tuesday or Wednesday.  He states that he was in the bathroom and slipped.  He fell backwards onto his lower back and right side.  Denies hitting his head.  He denies any loss of consciousness.  Since the fall, patient has had pain over his right side and has difficulty sleeping as he tends to lie down on his right side when he sleeps.  He has been taking Tylenol for the pain which has been helping with the pain.  He states that the pain has been getting better and that today has been better than prior days.  He still has some persistent pain over his right side but this is improved.  He initially needed assistance getting up from the fall.  He has been able to bear weight and ambulate at baseline with his walker per patient and wife.  This is his first fall this year but he had 18 falls last year per wife.  Review of Systems  Constitutional: Negative.   Respiratory: Negative.    Musculoskeletal:  Positive for back pain and falls.       Patient complains of pain over his back and right side since the fall  Neurological:  Negative for dizziness and focal weakness.  Psychiatric/Behavioral: Negative.          Objective:    BP 112/70   Pulse 62   Temp 97.8 F (36.6 C)   Ht 5\' 10"  (1.778 m)   Wt 184 lb 6.4 oz (83.6 kg)   SpO2 99%   BMI 26.46 kg/m  {Vitals History (Optional):23777}  Physical Exam Constitutional:      Appearance: Normal appearance.  HENT:     Head: Normocephalic and atraumatic.  Musculoskeletal:     Comments: Patient with ecchymosis noted over his  right flank which is where most of his pain is noted to be  Neurological:     Mental Status: He is alert and oriented to person, place, and time.     Comments: Dysphasia present.  Patient has difficulty communicating secondary to this.     No results found for any visits on 02/17/23.      Assessment & Plan:   Problem List Items Addressed This Visit       Other   Fall at home - Primary   -Patient presented today with a fall that occurred last Tuesday or Wednesday. -Patient states that he was in the bathroom and slipped and fell.  No loss of consciousness.  No head trauma.  He needed assistance getting up. -Since then he has been able to ambulate with his walker but has difficulty laying down on his right side secondary to pain -He has been in Tylenol at home for pain with some improvement over the last week. -Today, patient was noted to have ecchymosis over his right flank with tenderness to palpation over the ecchymosis -No tenderness to palpation over right hip or spine -I suspect that his pain is  likely secondary to his bruising but will obtain x-rays of his right hip and pelvis to ensure no fractures -Patient would benefit from physical therapy given multiple falls over the last year and a fall this year as well.  However, patient refuses at this time.  He will contact us if he changes his mind. -Will continue Tylenol as needed for now since pain is improving -Patient asked to call us with any questions or concerns.      Relevant Orders   DG Hip Unilat W OR W/O Pelvis 2-3 Views Right   DG Pelvis 1-2 Views    No orders of the defined types were placed in this encounter.   No follow-ups on file.  Earl Lagos, MD

## 2023-02-17 NOTE — Patient Instructions (Signed)
-  It was a pleasure meeting you today -I think your pain is secondary to bruising after the fall -I think you would benefit from physical therapy.  If you change your mind about this please let Todd Golden know and I will contact a physical therapist come to her house -Please continue with Tylenol as needed for the pain -Will check x-rays today to image her right hip and pelvis to ensure there are no fractures -Please call Todd Golden if any questions or concerns or if her symptoms worsen

## 2023-02-17 NOTE — Assessment & Plan Note (Addendum)
-  Patient presented today with a fall that occurred last Tuesday or Wednesday. -Patient states that he was in the bathroom and slipped and fell.  No loss of consciousness.  No head trauma.  He needed assistance getting up. -Since then he has been able to ambulate with his walker but has difficulty laying down on his right side secondary to pain -He has been in Tylenol at home for pain with some improvement over the last week. -Today, patient was noted to have ecchymosis over his right flank with tenderness to palpation over the ecchymosis -No tenderness to palpation over right hip or spine -I suspect that his pain is likely secondary to his bruising but will obtain x-rays of his right hip and pelvis to ensure no fractures -Patient would benefit from physical therapy given multiple falls over the last year and a fall this year as well.  However, patient refuses at this time.  He will contact us if he changes his mind. -Will continue Tylenol as needed for now since pain is improving -Patient asked to call us with any questions or concerns.  Addendum: -I called the patient today (02/24/2023) to inform him that we are still waiting for his official x-ray results. -I spoke with his wife who states that his symptoms have significantly improved. -I explained to her that we are still waiting for his x-ray results but I suspect that they should be normal.  We will let them know when his x-ray results come in.

## 2023-02-19 ENCOUNTER — Encounter: Payer: Self-pay | Admitting: Internal Medicine

## 2023-02-24 DIAGNOSIS — R10A1 Flank pain, right side: Secondary | ICD-10-CM | POA: Insufficient documentation

## 2023-02-24 DIAGNOSIS — R109 Unspecified abdominal pain: Secondary | ICD-10-CM | POA: Insufficient documentation

## 2023-02-24 NOTE — Assessment & Plan Note (Signed)
-  Patient presented to the office with a fall last Tuesday or Wednesday and had significant right flank pain -He had difficulty laying on his right side secondary to the pain -On exam, he was noted to have ecchymosis over his right flank with tenderness to palpation -X-rays were ordered to rule out a fracture but this seems less likely. -Patient was advised to continue Tylenol for the pain -We also advised physical therapy given multiple falls over the last year but he refuses at this time -No further workup for now

## 2023-02-26 ENCOUNTER — Telehealth: Payer: Self-pay | Admitting: Internal Medicine

## 2023-02-26 NOTE — Telephone Encounter (Signed)
 Called and spoke with patient's wife.  Patient's x-ray results show no fracture but he does have mild right hip OA.  No further workup required at this time.  Wife states the patient is feeling much better.

## 2023-04-04 DIAGNOSIS — M3501 Sicca syndrome with keratoconjunctivitis: Secondary | ICD-10-CM | POA: Diagnosis not present

## 2023-04-04 DIAGNOSIS — H353211 Exudative age-related macular degeneration, right eye, with active choroidal neovascularization: Secondary | ICD-10-CM | POA: Diagnosis not present

## 2023-04-04 DIAGNOSIS — H40053 Ocular hypertension, bilateral: Secondary | ICD-10-CM | POA: Diagnosis not present

## 2023-04-07 ENCOUNTER — Encounter: Payer: Self-pay | Admitting: Internal Medicine

## 2023-04-07 ENCOUNTER — Ambulatory Visit (INDEPENDENT_AMBULATORY_CARE_PROVIDER_SITE_OTHER): Admitting: Internal Medicine

## 2023-04-07 VITALS — BP 128/68 | HR 90 | Ht 70.0 in | Wt 186.8 lb

## 2023-04-07 DIAGNOSIS — R29898 Other symptoms and signs involving the musculoskeletal system: Secondary | ICD-10-CM | POA: Diagnosis not present

## 2023-04-07 DIAGNOSIS — R296 Repeated falls: Secondary | ICD-10-CM

## 2023-04-07 DIAGNOSIS — R2689 Other abnormalities of gait and mobility: Secondary | ICD-10-CM | POA: Diagnosis not present

## 2023-04-07 DIAGNOSIS — I69951 Hemiplegia and hemiparesis following unspecified cerebrovascular disease affecting right dominant side: Secondary | ICD-10-CM

## 2023-04-07 NOTE — Progress Notes (Unsigned)
 Subjective:  Patient ID: Todd Glenn., male    DOB: 07/27/1930  Age: 89 y.o. MRN: [id number]  CC: There were no encounter diagnoses.   HPI Todd Glenn. 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  He has difficulty rising from a seated position  and uses walker currently     Outpatient Medications Prior to Visit  Medication Sig Dispense Refill   acetaminophen (TYLENOL) 325 MG tablet Take 2 tablets (650 mg total) by mouth every 6 (six) hours as needed for mild pain (or Fever >/= 101).     aspirin EC 81 MG tablet Take 81 mg by mouth daily. Swallow whole.     cholecalciferol (VITAMIN D3) 25 MCG (1000 UNIT) tablet Take 1 tablet (1,000 Units total) by mouth daily. 30 tablet 0   Cyanocobalamin (B-12 PO) Take by mouth daily in the afternoon.     donepezil (ARICEPT) 10 MG tablet TAKE 1 TABLET(10 MG) BY MOUTH DAILY 90 tablet 1   l-methylfolate-B6-B12 (METANX) 3-35-2 MG TABS tablet Take 1 tablet by mouth daily. 30 tablet 0   Lactulose 20 GM/30ML SOLN 30 ml every 4 hours until constipation is relieved 437 mL 3   latanoprost (XALATAN) 0.005 % ophthalmic solution Place 1 drop into both eyes at bedtime.     magnesium gluconate (MAGONATE) 500 MG tablet Take 0.5 tablets (250 mg total) by mouth at bedtime. 30 tablet 0   metoprolol succinate (TOPROL-XL) 100 MG 24 hr tablet TAKE 1 TABLET(100 MG) BY MOUTH DAILY WITH OR IMMEDIATELY FOLLOWING A MEAL 90 tablet 1   Multiple Vitamins-Minerals (PRESERVISION AREDS 2 PO) Take by mouth 2 (two) times daily.     omeprazole (PRILOSEC) 40 MG capsule Take by mouth 1 capsule daily 90 capsule 3   rosuvastatin (CRESTOR) 10 MG tablet Take 1 tablet (10 mg  total) by mouth daily. 90 tablet 1   sertraline (ZOLOFT) 50 MG tablet Take 1 tablet (50 mg total) by mouth daily. 30 tablet 0   tamsulosin (FLOMAX) 0.4 MG CAPS capsule Take 2 capsules (0.8 mg total) by mouth daily after supper. 180 capsule 1   torsemide (DEMADEX) 20 MG tablet Take 1 tablet (20 mg total) by mouth every other day. 45 tablet 1   No facility-administered medications prior to visit.    Review of Systems;  Patient denies headache, fevers, malaise, unintentional weight loss, skin rash, eye pain, sinus congestion and sinus pain, sore throat, dysphagia,  hemoptysis , cough, dyspnea, wheezing, chest pain, palpitations, orthopnea, edema, abdominal pain, nausea, melena, diarrhea, constipation, flank pain, dysuria, hematuria, urinary  Frequency, nocturia, numbness, tingling, seizures,  Focal weakness, Loss of consciousness,  Tremor, insomnia, depression, anxiety, and suicidal ideation.      Objective:  BP 128/68   Pulse 90   Ht 5\' 10"  (1.778 m)   Wt 186 lb 12.8 oz (84.7 kg)   SpO2 98%   BMI 26.80 kg/m   BP Readings from Last 3 Encounters:  04/07/23 128/68  02/17/23 112/70  01/01/23 126/78    Wt Readings from Last 3 Encounters:  04/07/23 186 lb 12.8 oz (84.7 kg)  02/17/23 184 lb 6.4 oz (  83.6 kg)  01/01/23 184 lb (83.5 kg)    Physical Exam  Lab Results  Component Value Date   HGBA1C 6.2 01/01/2023   HGBA1C 5.6 03/16/2022   HGBA1C 6.2 11/02/2021    Lab Results  Component Value Date   CREATININE 1.26 01/01/2023   CREATININE 1.59 (H) 05/22/2022   CREATININE 1.33 (H) 05/03/2022    Lab Results  Component Value Date   WBC 7.9 01/01/2023   HGB 12.8 (L) 01/01/2023   HCT 39.1 01/01/2023   PLT 225.0 01/01/2023   GLUCOSE 104 (H) 01/01/2023   CHOL 135 01/01/2023   TRIG 108.0 01/01/2023   HDL 40.60 01/01/2023   LDLDIRECT 74.0 01/01/2023   LDLCALC 73 01/01/2023   ALT 20 01/01/2023   AST 20 01/01/2023   NA 141 01/01/2023   K 3.9 01/01/2023   CL 101 01/01/2023    CREATININE 1.26 01/01/2023   BUN 21 01/01/2023   CO2 30 01/01/2023   TSH 1.42 01/01/2023   PSA 3.32 11/02/2021   INR 1.1 03/15/2022   HGBA1C 6.2 01/01/2023    ECHOCARDIOGRAM LIMITED Result Date: 05/03/2022    ECHOCARDIOGRAM LIMITED REPORT   Patient Name:   Todd Mcclenny. Date of Exam: 05/03/2022 Medical Rec #:  [id number]            Height:       71.0 in Accession #:    [id number]           Weight:       175.7 lb Date of Birth:  [birth year]            BSA:          1.996 m Patient Age:    89 years             BP:           120/92 mmHg Patient Gender: M                    HR:           92 bpm. Exam Location:  [facility] Procedure: Limited Echo and Limited Color Doppler Indications:     Atrial Fibrillation  History:         Patient has no prior history of Echocardiogram examinations.                  CHF, CAD and Acute MI, Stroke, Arrythmias:Atrial Fibrillation,                  Signs/Symptoms:Altered Mental Status; Risk Factors:Hypertension                  and Dyslipidemia.  Sonographer:     Mikki Harbor Referring Phys:  [id number] Antonieta Iba Diagnosing Phys: Julien Nordmann MD IMPRESSIONS  1. Left ventricular ejection fraction, by estimation, is 30 to 35%. Left ventricular ejection fraction by PLAX is 27 %. The left ventricle has moderately decreased function. The left ventricle demonstrates global hypokinesis. Left ventricular diastolic parameters are indeterminate.  2. Right ventricular systolic function is normal. The right ventricular size is normal. Tricuspid regurgitation signal is inadequate for assessing PA pressure.  3. Left atrial size was mild to moderately dilated.  4. The mitral valve is normal in structure. Mild mitral valve regurgitation. No evidence of mitral stenosis.  5. The aortic valve is normal in structure. There is mild calcification of the aortic valve. Aortic valve regurgitation is not visualized. Aortic valve sclerosis/calcification is present, without any  evidence of aortic  stenosis.  6. There is borderline dilatation of the aortic root, measuring 39 mm.  7. The inferior vena cava is normal in size with greater than 50% respiratory variability, suggesting right atrial pressure of 3 mmHg.  8. Rhythm is atrial fibrillation FINDINGS  Left Ventricle: Left ventricular ejection fraction, by estimation, is 30 to 35%. Left ventricular ejection fraction by PLAX is 27 %. The left ventricle has moderately decreased function. The left ventricle demonstrates global hypokinesis. The left ventricular internal cavity size was normal in size. There is no left ventricular hypertrophy. Left ventricular diastolic parameters are indeterminate. Right Ventricle: The right ventricular size is normal. No increase in right ventricular wall thickness. Right ventricular systolic function is normal. Tricuspid regurgitation signal is inadequate for assessing PA pressure. Left Atrium: Left atrial size was mild to moderately dilated. Right Atrium: Right atrial size was normal in size. Pericardium: There is no evidence of pericardial effusion. Mitral Valve: The mitral valve is normal in structure. There is mild calcification of the mitral valve leaflet(s). Mild mitral valve regurgitation. No evidence of mitral valve stenosis. MV peak gradient, 3.6 mmHg. The mean mitral valve gradient is 1.0 mmHg. Tricuspid Valve: The tricuspid valve is normal in structure. Tricuspid valve regurgitation is not demonstrated. No evidence of tricuspid stenosis. Aortic Valve: The aortic valve is normal in structure. There is mild calcification of the aortic valve. Aortic valve regurgitation is not visualized. Aortic valve sclerosis/calcification is present, without any evidence of aortic stenosis. Pulmonic Valve: The pulmonic valve was normal in structure. Pulmonic valve regurgitation is not visualized. No evidence of pulmonic stenosis. Aorta: The aortic root is normal in size and structure. There is borderline dilatation of the aortic  root, measuring 39 mm. Venous: The inferior vena cava is normal in size with greater than 50% respiratory variability, suggesting right atrial pressure of 3 mmHg. IAS/Shunts: No atrial level shunt detected by color flow Doppler. LEFT VENTRICLE PLAX 2D LV EF:         Left ventricular ejection fraction by PLAX is 27 %. LVIDd:         4.80 cm LVIDs:         4.20 cm LV PW:         1.35 cm LV IVS:        1.25 cm LVOT diam:     2.00 cm LVOT Area:     3.14 cm  LEFT ATRIUM             Index        RIGHT ATRIUM           Index LA diam:        4.10 cm 2.05 cm/m   RA Area:     17.90 cm LA Vol (A2C):   76.3 ml 38.23 ml/m  RA Volume:   51.60 ml  25.86 ml/m LA Vol (A4C):   54.6 ml 27.36 ml/m LA Biplane Vol: 65.2 ml 32.67 ml/m   AORTA Ao Root diam: 3.90 cm MITRAL VALVE MV Area (PHT): 4.19 cm    SHUNTS MV Peak grad:  3.6 mmHg    Systemic Diam: 2.00 cm MV Mean grad:  1.0 mmHg MV Vmax:       0.95 m/s MV Vmean:      54.3 cm/s MV Decel Time: 181 msec MV E velocity: 71.30 cm/s Julien Nordmann MD Electronically signed by Julien Nordmann MD Signature Date/Time: 05/03/2022/1:26:25 PM    Final     Assessment & Plan:  .  There are no diagnoses linked to this encounter.   I spent 34 minutes on the day of this face to face encounter reviewing patient's  most recent visit with cardiology,  nephrology,  and neurology,  prior relevant surgical and non surgical procedures, recent  labs and imaging studies, counseling on weight management,  reviewing the assessment and plan with patient, and post visit ordering and reviewing of  diagnostics and therapeutics with patient  .   Follow-up: No follow-ups on file.   Sherlene Shams, MD

## 2023-04-08 ENCOUNTER — Encounter: Payer: Self-pay | Admitting: Internal Medicine

## 2023-04-08 NOTE — Assessment & Plan Note (Signed)
 Secondary to prior CVA and right sided hemiplegia

## 2023-04-08 NOTE — Assessment & Plan Note (Signed)
 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  He would benefit from use of a wheelchair for use outside of the home.  It is unclear if he will require a Hoya lift device as well for transfers.  PT evaluation via Home health needed and order signed

## 2023-04-17 ENCOUNTER — Telehealth: Payer: Self-pay

## 2023-04-17 DIAGNOSIS — I083 Combined rheumatic disorders of mitral, aortic and tricuspid valves: Secondary | ICD-10-CM | POA: Diagnosis not present

## 2023-04-17 DIAGNOSIS — Z556 Problems related to health literacy: Secondary | ICD-10-CM | POA: Diagnosis not present

## 2023-04-17 DIAGNOSIS — I480 Paroxysmal atrial fibrillation: Secondary | ICD-10-CM | POA: Diagnosis not present

## 2023-04-17 DIAGNOSIS — I69351 Hemiplegia and hemiparesis following cerebral infarction affecting right dominant side: Secondary | ICD-10-CM | POA: Diagnosis not present

## 2023-04-17 DIAGNOSIS — R296 Repeated falls: Secondary | ICD-10-CM

## 2023-04-17 DIAGNOSIS — Z9181 History of falling: Secondary | ICD-10-CM | POA: Diagnosis not present

## 2023-04-17 DIAGNOSIS — I69320 Aphasia following cerebral infarction: Secondary | ICD-10-CM | POA: Diagnosis not present

## 2023-04-17 DIAGNOSIS — R2689 Other abnormalities of gait and mobility: Secondary | ICD-10-CM

## 2023-04-17 DIAGNOSIS — R29898 Other symptoms and signs involving the musculoskeletal system: Secondary | ICD-10-CM

## 2023-04-17 DIAGNOSIS — Z7982 Long term (current) use of aspirin: Secondary | ICD-10-CM | POA: Diagnosis not present

## 2023-04-17 NOTE — Telephone Encounter (Signed)
 Copied from CRM [id number]. Topic: Clinical - Home Health Verbal Orders >> Apr 17, 2023 11:46 AM Randa Ngo wrote: Caller/Agency: Irving Burton, PT with [facility] Callback Number: [phone] Service Requested: Physical Therapy Frequency: 1x / week for the first week, and then 2x / week for the next 4 weeks. Any new concerns about the patient? No

## 2023-04-17 NOTE — Telephone Encounter (Signed)
 Spoke with Irving Burton the physical therapist and she stated that the pt would benefit more from a transfer chair than a wheel chair and hoyer lift. Irving Burton stated that it would also help the pt's wife out as well because the transfer chair is not as heavy and wife would not be able to use the hoyer lift with pt. If this is okay I will create the DME order.

## 2023-04-17 NOTE — Telephone Encounter (Signed)
 Copied from CRM [id number]. Topic: Clinical - Medical Advice >> Apr 17, 2023 11:49 AM Randa Ngo wrote: Reason for CRM: Irving Burton from [facility] called regarding the patient's wheelchair situation. Irving Burton stated the wheelchair causes a bit of a concern as the wife would not be able to push it as it would be too heavy. Irving Burton also states how a Michiel Sites lift would not be doable as the wife would not be able to operate that as well. Irving Burton recommends a transport chair as the best option as the wife was on board with this and it will be lighter for her to operate. Callback number for Irving Burton is [phone] to discuss.

## 2023-04-17 NOTE — Telephone Encounter (Signed)
 Verbal orders given

## 2023-04-18 NOTE — Telephone Encounter (Signed)
Printed and placed in quick sign folder 

## 2023-04-18 NOTE — Addendum Note (Signed)
 Addended by: Sandy Salaam on: 04/18/2023 04:15 PM   Modules accepted: Orders

## 2023-04-21 NOTE — Telephone Encounter (Signed)
 LMTCB

## 2023-04-21 NOTE — Telephone Encounter (Signed)
 Spoke with pt's wife to let her know that the physical therapist does not recommend a wheelchair and a hoyer lift, she thinks that a transfer chair would better benefit the pt and the pt's wife. I let pt's wife know that she will need to come by the office to get the rx for the chair and then take it to a medical supply store. Wife gave a verbal understanding.

## 2023-04-24 ENCOUNTER — Encounter: Payer: Self-pay | Admitting: Podiatry

## 2023-04-24 ENCOUNTER — Ambulatory Visit: Payer: PPO | Admitting: Podiatry

## 2023-04-24 DIAGNOSIS — L84 Corns and callosities: Secondary | ICD-10-CM | POA: Diagnosis not present

## 2023-04-24 DIAGNOSIS — M79675 Pain in left toe(s): Secondary | ICD-10-CM | POA: Diagnosis not present

## 2023-04-24 DIAGNOSIS — B351 Tinea unguium: Secondary | ICD-10-CM | POA: Diagnosis not present

## 2023-04-24 DIAGNOSIS — M79674 Pain in right toe(s): Secondary | ICD-10-CM

## 2023-04-24 DIAGNOSIS — D689 Coagulation defect, unspecified: Secondary | ICD-10-CM | POA: Diagnosis not present

## 2023-04-28 ENCOUNTER — Encounter: Payer: Self-pay | Admitting: Podiatry

## 2023-04-28 NOTE — Progress Notes (Signed)
  Subjective:  Patient ID: Todd Golden., male    DOB: 03/05/1930,  MRN: [id number]  89 y.o. male presents at risk foot care with h/o clotting disorder and corn(s) right lower extremity and painful thick toenails that are difficult to trim. Painful toenails interfere with ambulation. Aggravating factors include wearing enclosed shoe gear. Pain is relieved with periodic professional debridement. Painful corns are aggravated when weightbearing when wearing enclosed shoe gear. Pain is relieved with periodic professional debridement. Chief Complaint  Patient presents with   Nail Problem    "Trim his toenails."   New problem(s): None   PCP is Sherlene Shams, MD , and last visit was April 07, 2023.  No Known Allergies  Review of Systems: Negative except as noted in the HPI.   Objective:  [place]. is a pleasant 89 y.o. male in NAD. AAO x 3.  Vascular Examination: Vascular status intact b/l with palpable pedal pulses. CFT immediate b/l. No edema. No pain with calf compression b/l. Skin temperature gradient WNL b/l.   Neurological Examination: Sensation grossly intact b/l with 10 gram monofilament.   Dermatological Examination: Pedal skin with normal turgor, texture and tone b/l. Toenails 1-5 b/l thick, discolored, elongated with subungual debris and pain on dorsal palpation. Hyperkeratotic lesion(s) distal tip of right 3rd toe.  No erythema, no edema, no drainage, no fluctuance.  Musculoskeletal Examination: Muscle strength 5/5 to b/l LE. Hallux hammertoe noted b/l LE. Clawtoe deformity R 3rd toe.  Radiographs: None Last A1c:      Latest Ref Rng & Units 01/01/2023    9:58 AM  Hemoglobin A1C  Hemoglobin-A1c 4.6 - 6.5 % 6.2    Assessment:   1. Pain due to onychomycosis of toenails of both feet   2. Corns   3. Clotting disorder [facility])    Plan:  Patient was evaluated and treated. All patient's and/or POA's questions/concerns addressed on today's visit. Mycotic  toenails 1-5 debrided in length and girth without incident. Corn(s) R 3rd toe pared with sharp debridement without incident. Continue soft, supportive shoe gear daily. Report any pedal injuries to medical professional. Call office if there are any questions/concerns.  Return in about 3 months (around 07/24/2023).  Freddie Breech, DPM      [place] LOCATION: [address] [place] [address]                   Office [phone]   [facility] LOCATION: [address] [place], [place] [address] Office [phone]

## 2023-05-07 DIAGNOSIS — I480 Paroxysmal atrial fibrillation: Secondary | ICD-10-CM | POA: Diagnosis not present

## 2023-05-07 DIAGNOSIS — Z556 Problems related to health literacy: Secondary | ICD-10-CM | POA: Diagnosis not present

## 2023-05-07 DIAGNOSIS — I69320 Aphasia following cerebral infarction: Secondary | ICD-10-CM | POA: Diagnosis not present

## 2023-05-07 DIAGNOSIS — I083 Combined rheumatic disorders of mitral, aortic and tricuspid valves: Secondary | ICD-10-CM | POA: Diagnosis not present

## 2023-05-07 DIAGNOSIS — Z9181 History of falling: Secondary | ICD-10-CM | POA: Diagnosis not present

## 2023-05-07 DIAGNOSIS — I69351 Hemiplegia and hemiparesis following cerebral infarction affecting right dominant side: Secondary | ICD-10-CM | POA: Diagnosis not present

## 2023-05-07 DIAGNOSIS — Z7982 Long term (current) use of aspirin: Secondary | ICD-10-CM | POA: Diagnosis not present

## 2023-05-28 ENCOUNTER — Other Ambulatory Visit: Payer: Self-pay | Admitting: Internal Medicine

## 2023-05-30 ENCOUNTER — Ambulatory Visit: Attending: Medical | Admitting: Medical

## 2023-05-30 ENCOUNTER — Encounter: Payer: Self-pay | Admitting: Medical

## 2023-05-30 VITALS — BP 110/76 | HR 79 | Ht 70.0 in | Wt 184.0 lb

## 2023-05-30 DIAGNOSIS — I5022 Chronic systolic (congestive) heart failure: Secondary | ICD-10-CM

## 2023-05-30 DIAGNOSIS — I10 Essential (primary) hypertension: Secondary | ICD-10-CM

## 2023-05-30 DIAGNOSIS — I4821 Permanent atrial fibrillation: Secondary | ICD-10-CM | POA: Diagnosis not present

## 2023-05-30 NOTE — Patient Instructions (Signed)
 Medication Instructions:  No changes at this time.   *If you need a refill on your cardiac medications before your next appointment, please call your pharmacy*  Lab Work: None  If you have labs (blood work) drawn today and your tests are completely normal, you will receive your results only by: MyChart Message (if you have MyChart) OR A paper copy in the mail If you have any lab test that is abnormal or we need to change your treatment, we will call you to review the results.  Testing/Procedures: None  Follow-Up: At [facility], you and your health needs are our priority.  As part of our continuing mission to provide you with exceptional heart care, our providers are all part of one team.  This team includes your primary Cardiologist (physician) and Advanced Practice Providers or APPs (Physician Assistants and Nurse Practitioners) who all work together to provide you with the care you need, when you need it.  Your next appointment:   6 month(s)  Provider:   Lorine Bears, MD or Cadence Fransico Michael, [place]

## 2023-05-30 NOTE — Progress Notes (Signed)
 Cardiology Office Note:  .   Date:  05/30/2023  ID:  Todd Aye., DOB Jul 02, 1930, MRN [id number] PCP: Thersia Flax, MD  [facility] Providers Cardiologist:  Todd Kirks, MD {    History of Present Illness: .   Todd Kestel. is a 89 y.o. male with a history of permanent atrial fibrillation, chronic systolic heart failure, stroke, hypertension, hyperlipidemia, and glaucoma who presents for follow-up.    November 2015, he was admitted with expressive aphasia and mild ataxia due to left MCA stroke in the setting of A-fib.  Echo showed normal LV function with carotid Doppler showing no obstructive disease.  He was placed on Eliquis .  He was taken off anticoagulation due to recurrent falls and dementia.  He was hospitalized in January 2024 with a stroke.  He was hospitalized again in April with shortness of breath noted to be in A-fib RVR with decompensated heart failure.  He improved with diuresis.  Echo showed EF of 35 to 40%.  Reduced EF was felt to be tachycardia mediated.  Rate control was difficult and ultimately he was given a few doses of digoxin  and amiodarone  was added to Toprol .  His blood pressure was elevated and Cardura  was added.  Hydrochlorothiazide  was discontinued due to torsemide .  Amiodarone  was subsequently discontinued and Cardura  was stopped due to orthostatic dizziness.  Patient was last seen in August 2024 and was overall doing well from a cardiac perspective.  Today, the patient reports he has been doing OK. He denies chest, SOB, lower leg edema, orthopnea, pnd, lightheadedness, dizziness, palpitations, heart racing. He uses a Rollator all the time. He has had 4 mechanical falls this year. Neighbors come over to help him get up. He denies any injuries from the falls. Labs from December 2024 reviewed.   Studies Reviewed: Todd Golden   EKG Interpretation Date/Time:  Friday May 30 2023 10:00:23 EDT Ventricular Rate:  79 PR Interval:    QRS  Duration:  100 QT Interval:  410 QTC Calculation: 470 R Axis:   -37  Text Interpretation: Atrial fibrillation Premature ventricular complexes Left axis deviation Nonspecific ST abnormality When compared with ECG of 05-Sep-2022 08:49, Nonspecific T wave abnormality, improved in Inferior leads Confirmed by Todd Golden, Todd Golden ([id number]) on 05/30/2023 10:07:50 AM    Echo 04/2022 1. Left ventricular ejection fraction, by estimation, is 30 to 35%. Left  ventricular ejection fraction by PLAX is 27 %. The left ventricle has  moderately decreased function. The left ventricle demonstrates global  hypokinesis. Left ventricular diastolic  parameters are indeterminate.   2. Right ventricular systolic function is normal. The right ventricular  size is normal. Tricuspid regurgitation signal is inadequate for assessing  PA pressure.   3. Left atrial size was mild to moderately dilated.   4. The mitral valve is normal in structure. Mild mitral valve  regurgitation. No evidence of mitral stenosis.   5. The aortic valve is normal in structure. There is mild calcification  of the aortic valve. Aortic valve regurgitation is not visualized. Aortic  valve sclerosis/calcification is present, without any evidence of aortic  stenosis.   6. There is borderline dilatation of the aortic root, measuring 39 mm.   7. The inferior vena cava is normal in size with greater than 50%  respiratory variability, suggesting right atrial pressure of 3 mmHg.   8. Rhythm is atrial fibrillation    Echo 02/2022 1. Left ventricular ejection fraction, by estimation, is 40 to 45%. The  left  ventricle has mildly decreased function. The left ventricle  demonstrates global hypokinesis. Left ventricular diastolic function could  not be evaluated.   2. Right ventricular systolic function is normal. The right ventricular  size is normal. There is normal pulmonary artery systolic pressure. The  estimated right ventricular systolic pressure is  19.2 mmHg.   3. The mitral valve is normal in structure. Trivial mitral valve  regurgitation. No evidence of mitral stenosis.   4. The aortic valve is normal in structure. Aortic valve regurgitation is  not visualized. Aortic valve sclerosis/calcification is present, without  any evidence of aortic stenosis. Aortic valve area, by VTI measures 2.26  cm. Aortic valve mean gradient  measures 2.7 mmHg. Aortic valve Vmax measures 1.15 m/s.   5. Aortic dilatation noted. There is mild dilatation of the ascending  aorta, measuring 41 mm.   6. The inferior vena cava is normal in size with greater than 50%  respiratory variability, suggesting right atrial pressure of 3 mmHg.           Physical Exam:   VS:  BP 110/76 (BP Location: Left Arm, Patient Position: Sitting, Cuff Size: Large)   Pulse 79   Ht 5\' 10"  (1.778 m)   Wt 184 lb (83.5 kg)   SpO2 96%   BMI 26.40 kg/m    Wt Readings from Last 3 Encounters:  05/30/23 184 lb (83.5 kg)  04/07/23 186 lb 12.8 oz (84.7 kg)  02/17/23 184 lb 6.4 oz (83.6 kg)    GEN: Well nourished, well developed in no acute distress NECK: No JVD; No carotid bruits CARDIAC: RRR, no murmurs, rubs, gallops RESPIRATORY:  Clear to auscultation without rales, wheezing or rhonchi  ABDOMEN: Soft, non-tender, non-distended EXTREMITIES:  No edema; No deformity   ASSESSMENT AND PLAN: .    Permanent Afib He is in rate controlled Afib on EKG. He is not on a/c due to falls. He reports 4 mechanical falls this year. Continue ASA 81mg  daily. He is not a candidate for Watchman due to comorbidities. Continue Toprol  100mg  daily for rate control.   HTN BP is normal. Continue Toprol  100mg  daily.   Chronic systolic heart failure Most recent echo in 2024 showed LVEF 30-35%, suspected tachy-mediated. He is euvolemic on exam. Continue Torsemide  20mg  daily and Toprol  100mg  daily.        Dispo: Follow-up in 6 months  Signed, Todd Golden Todd Canada, PA-C

## 2023-06-23 ENCOUNTER — Other Ambulatory Visit: Payer: Self-pay | Admitting: Internal Medicine

## 2023-06-26 ENCOUNTER — Other Ambulatory Visit: Payer: Self-pay

## 2023-06-26 ENCOUNTER — Emergency Department
Admission: EM | Admit: 2023-06-26 | Discharge: 2023-06-26 | Disposition: A | Discharge status: Home / Self Care | Attending: Emergency Medicine | Admitting: Emergency Medicine

## 2023-06-26 ENCOUNTER — Emergency Department

## 2023-06-26 DIAGNOSIS — S2241XA Multiple fractures of ribs, right side, initial encounter for closed fracture: Secondary | ICD-10-CM | POA: Insufficient documentation

## 2023-06-26 DIAGNOSIS — I110 Hypertensive heart disease with heart failure: Secondary | ICD-10-CM | POA: Diagnosis not present

## 2023-06-26 DIAGNOSIS — W19XXXA Unspecified fall, initial encounter: Secondary | ICD-10-CM | POA: Diagnosis not present

## 2023-06-26 DIAGNOSIS — W01198A Fall on same level from slipping, tripping and stumbling with subsequent striking against other object, initial encounter: Secondary | ICD-10-CM | POA: Diagnosis not present

## 2023-06-26 DIAGNOSIS — S299XXA Unspecified injury of thorax, initial encounter: Secondary | ICD-10-CM | POA: Diagnosis present

## 2023-06-26 DIAGNOSIS — R0781 Pleurodynia: Secondary | ICD-10-CM | POA: Diagnosis not present

## 2023-06-26 DIAGNOSIS — I5020 Unspecified systolic (congestive) heart failure: Secondary | ICD-10-CM | POA: Diagnosis not present

## 2023-06-26 MED ORDER — TRAMADOL HCL 50 MG PO TABS: 50.0000 mg | ORAL_TABLET | Freq: Two times a day (BID) | ORAL | 0 refills | Status: DC | PRN

## 2023-06-26 MED ORDER — TRAMADOL HCL 50 MG PO TABS
50.0000 mg | ORAL_TABLET | Freq: Once | ORAL | Status: AC
  Administered 2023-06-26: 50 mg via ORAL
  Filled 2023-06-26: qty 1

## 2023-06-26 NOTE — ED Notes (Signed)
 At this time, pt has non slip socks on, fall risk bracelet and bed alarm is on. Pt is a high fall risk.

## 2023-06-26 NOTE — ED Triage Notes (Signed)
 Pt to ED via [facility] from home for c/o fall last night, right side back pain that radiates to the front . Pt has had multiple falls recently. Pt has hx stroke, with right side weakness and aphasia. HOH. Hx Afib, not on thinners due to hx of falls.

## 2023-06-26 NOTE — ED Notes (Signed)
 Patient transported to X-ray

## 2023-06-26 NOTE — Discharge Instructions (Addendum)
 Take the tramadol as needed for pain over the next few days.  You should transition to over-the-counter Tylenol  when you are able to.  Make sure to take deep breaths frequently in order to keep your lungs open.  Follow-up with your primary care provider.  Return to the ER for new, worsening, or persistent severe pain, shortness of breath, cough, fever, or any other new or worsening symptoms that concern you.

## 2023-06-26 NOTE — ED Provider Notes (Signed)
 [facility] Provider Note    Event Date/Time   First MD Initiated Contact with Patient 06/26/23 [phone]     (approximate)   History   Fall   HPI  Todd Golden. is a 89 y.o. male with history of hypertension, hyperlipidemia, atrial fibrillation, systolic heart failure, stroke with aphasia and right-sided hemiplegia, and glaucoma who presents with right-sided pain after a fall last night.  Per the patient and his wife, he was coming into the bedroom when he had a mechanical fall, causing him to hit his right side on the edge of the bed.  He did not hit his head or lose consciousness.  He denies any head, neck, or midline back pain.  He has pain to the right side of his torso and flank.  He denies any chest or abdominal pain.  I reviewed the past medical records.  The patient's most recent outpatient encounter was with cardiology on 5/9 for follow-up with his chronic conditions.   Physical Exam   Triage Vital Signs: ED Triage Vitals  Encounter Vitals Group     BP 06/26/23 0717 (!) 129/101     Systolic BP Percentile --      Diastolic BP Percentile --      Pulse Rate 06/26/23 0717 63     Resp 06/26/23 0717 16     Temp 06/26/23 0717 97.6 F (36.4 C)     Temp Source 06/26/23 0717 Oral     SpO2 06/26/23 0717 95 %     Weight 06/26/23 0721 189 lb 1.6 oz (85.8 kg)     Height 06/26/23 0721 5\' 10"  (1.778 m)     Head Circumference --      Peak Flow --      Pain Score 06/26/23 0718 8     Pain Loc --      Pain Education --      Exclude from Growth Chart --     Most recent vital signs: Vitals:   06/26/23 0730 06/26/23 0800  BP: 120/88 130/87  Pulse: (!) 58 63  Resp: 15 20  Temp:    SpO2: 93% 93%     General: Awake, no distress.  CV:  Good peripheral perfusion.  Resp:  Normal effort.  No chest wall tenderness. Abd:  Soft and nontender.  No distention.  Other:  No midline spinal tenderness.  Right lateral lower rib area tenderness with no step-off  or crepitus.   ED Results / Procedures / Treatments   Labs (all labs ordered are listed, but only abnormal results are displayed) Labs Reviewed - No data to display   EKG  ED ECG REPORT I, Lind Repine, the attending physician, personally viewed and interpreted this ECG.  Date: 06/26/2023 EKG Time: 69 Rate: 07 23 Rhythm: normal sinus rhythm QRS Axis: normal Intervals: normal ST/T Wave abnormalities: normal Narrative Interpretation: no evidence of acute ischemia    RADIOLOGY  XR chest/R ribs: I independently viewed and interpreted the images; there is no focal consolidation or edema.  There is no pneumothorax.  Radiology report indicates 7th and 8th right rib fractures.   PROCEDURES:  Critical Care performed: No  Procedures   MEDICATIONS ORDERED IN ED: Medications  traMADol (ULTRAM) tablet 50 mg (50 mg Oral Given 06/26/23 0806)     IMPRESSION / MDM / ASSESSMENT AND PLAN / ED COURSE  I reviewed the triage vital signs and the nursing notes.  89 year old male with PMH as noted above presents  with right-sided thoracic pain after a mechanical fall last night.  On exam his vital signs are normal.  He has no chest or abdominal tenderness.  There is no midline spinal tenderness.  The pain appears to be localized to the right lateral ribs.  There is no significant ecchymosis or swelling.  Differential diagnosis includes, but is not limited to, contusion, fracture.  Will obtain rib x-rays.  There is no indication for spine imaging.  There is no evidence of soft tissue or visceral abdominal injury.  Patient's presentation is most consistent with acute complicated illness / injury requiring diagnostic workup.  The patient is on the cardiac monitor to evaluate for evidence of arrhythmia and/or significant heart rate changes.   ----------------------------------------- 10:14 AM on 06/26/2023 -----------------------------------------  X-ray reveals 7th and 8th rib  fractures.  I did consider whether the patient may benefit from inpatient admission given his age and comorbidities.  However, the patient has an O2 saturation in the high 90s and no respiratory distress at this time.  Is been more than 12 hours since the injury.  He feels better after Toradol and tolerated it well.  He would prefer to go home.  He is stable for discharge at this time.  I counseled the patient and his wife on the results of the workup and plan of care.  I gave strict return precautions, and they expressed understanding.   FINAL CLINICAL IMPRESSION(S) / ED DIAGNOSES   Final diagnoses:  Fall, initial encounter  Closed fracture of multiple ribs of right side, initial encounter     Rx / DC Orders   ED Discharge Orders          Ordered    traMADol (ULTRAM) 50 MG tablet  Every 12 hours PRN        06/26/23 1013             Note:  This document was prepared using Dragon voice recognition software and may include unintentional dictation errors.    Lind Repine, MD 06/26/23 1015

## 2023-07-02 ENCOUNTER — Encounter: Payer: Self-pay | Admitting: Internal Medicine

## 2023-07-02 ENCOUNTER — Ambulatory Visit (INDEPENDENT_AMBULATORY_CARE_PROVIDER_SITE_OTHER): Payer: PPO | Admitting: Internal Medicine

## 2023-07-02 VITALS — BP 120/88 | HR 68 | Ht 70.0 in | Wt 183.0 lb

## 2023-07-02 DIAGNOSIS — R2689 Other abnormalities of gait and mobility: Secondary | ICD-10-CM | POA: Diagnosis not present

## 2023-07-02 DIAGNOSIS — I10 Essential (primary) hypertension: Secondary | ICD-10-CM | POA: Diagnosis not present

## 2023-07-02 DIAGNOSIS — R29898 Other symptoms and signs involving the musculoskeletal system: Secondary | ICD-10-CM | POA: Diagnosis not present

## 2023-07-02 DIAGNOSIS — E785 Hyperlipidemia, unspecified: Secondary | ICD-10-CM | POA: Diagnosis not present

## 2023-07-02 DIAGNOSIS — Z9181 History of falling: Secondary | ICD-10-CM

## 2023-07-02 DIAGNOSIS — G319 Degenerative disease of nervous system, unspecified: Secondary | ICD-10-CM | POA: Diagnosis not present

## 2023-07-02 DIAGNOSIS — Z8781 Personal history of (healed) traumatic fracture: Secondary | ICD-10-CM

## 2023-07-02 DIAGNOSIS — R296 Repeated falls: Secondary | ICD-10-CM

## 2023-07-02 LAB — COMPREHENSIVE METABOLIC PANEL WITH GFR
ALT: 17 U/L (ref 0–53)
AST: 19 U/L (ref 0–37)
Albumin: 4.2 g/dL (ref 3.5–5.2)
Alkaline Phosphatase: 59 U/L (ref 39–117)
BUN: 22 mg/dL (ref 6–23)
CO2: 30 meq/L (ref 19–32)
Calcium: 9.3 mg/dL (ref 8.4–10.5)
Chloride: 100 meq/L (ref 96–112)
Creatinine, Ser: 1.22 mg/dL (ref 0.40–1.50)
GFR: 52.02 mL/min — ABNORMAL LOW (ref >60.00)
Glucose, Bld: 103 mg/dL — ABNORMAL HIGH (ref 70–99)
Potassium: 4.1 meq/L (ref 3.5–5.1)
Sodium: 137 meq/L (ref 135–145)
Total Bilirubin: 0.8 mg/dL (ref 0.2–1.2)
Total Protein: 7 g/dL (ref 6.0–8.3)

## 2023-07-02 LAB — LIPID PANEL
Cholesterol: 132 mg/dL (ref 0–200)
HDL: 37.8 mg/dL — ABNORMAL LOW (ref >39.00)
LDL Cholesterol: 71 mg/dL (ref 0–99)
NonHDL: 94
Total CHOL/HDL Ratio: 3
Triglycerides: 115 mg/dL (ref 0.0–149.0)
VLDL: 23 mg/dL (ref 0.0–40.0)

## 2023-07-02 LAB — LDL CHOLESTEROL, DIRECT: Direct LDL: 78 mg/dL

## 2023-07-02 MED ORDER — TORSEMIDE 20 MG PO TABS: 20.0000 mg | ORAL_TABLET | ORAL | 1 refills | Status: DC

## 2023-07-02 MED ORDER — TAMSULOSIN HCL 0.4 MG PO CAPS: 0.8000 mg | ORAL_CAPSULE | Freq: Every day | ORAL | 1 refills | Status: DC

## 2023-07-02 MED ORDER — DONEPEZIL HCL 10 MG PO TABS: ORAL_TABLET | ORAL | 1 refills | Status: DC

## 2023-07-02 MED ORDER — ROSUVASTATIN CALCIUM 10 MG PO TABS: 10.0000 mg | ORAL_TABLET | Freq: Every day | ORAL | 1 refills | Status: DC

## 2023-07-02 MED ORDER — LACTULOSE 20 GM/30ML PO SOLN: ORAL | 3 refills | Status: AC

## 2023-07-02 MED ORDER — TRAMADOL HCL 50 MG PO TABS: 50.0000 mg | ORAL_TABLET | Freq: Four times a day (QID) | ORAL | 1 refills | Status: AC | PRN

## 2023-07-02 NOTE — Patient Instructions (Addendum)
 Use the tramadol  and tylenol  for pain management.  I have refilled the tramadol  so it can be used more often if needed,  3 to 4 times daily    Todd Golden can take  up to 3000 mg of acetominophen (tylenol ) every day safely  In divided dose (1000 mg every 8 hours )   Reduce the torsemide  to 1/2 tablet every other day and follow his daily weights.  If his weight jumps  2 lbs over night or 5 lbs over a week , double his torsemide  dose daily until weight returns to baseline

## 2023-07-02 NOTE — Assessment & Plan Note (Signed)
 Secondary to  Embolic CVA and cerebrovascular disease,  With mild dementia .  He cannot be left alone.  Home health aide needed for several hours of assistance per week for  showers/hygiene

## 2023-07-02 NOTE — Progress Notes (Addendum)
 Subjective:  Patient ID: Todd Aye., male    DOB: 1930/01/30  Age: 89 y.o. MRN: [id number]  CC: The primary encounter diagnosis was Essential hypertension, benign. Diagnoses of Dyslipidemia, History of recent fall, Bilateral leg weakness, History of rib fracture, Balance problem, Diffuse brain atrophy (HCC), and Frequent falls were also pertinent to this visit.   HPI Todd Aye. presents for  mobility issues  Chief Complaint  Patient presents with   Medical Management of Chronic Issues    6 month follow up     Todd Golden is a 89 yr old male with a history of CVA resulting in aphasia  and right sided weakness who is accompanied by hiw wife and son ,  He was recently treated in the ER for rib fractures that occurred after a recent fall.  Todd Golden His last fall occurred on  Jun 5 and resulted in 2 fractured ribs posteriorly on the right.   Per wife , he lost his balance while using walker and struck his back against a board that was protruding  from the bed platform,.  He was evaluated and sent home with a limited supply of tramadol  which he has been using twice daily along with subtherapeutic doses of tylenol .  He is now sleeping in reclining chair due to pain with exertion. He is unable to sleep  in bed due to to rib pain.  His wife notes that his legs have been weaker since the fall. He is shuffling shorter distances  and then begins to stumble .  Right hand has become clumsy again . His wife is having  significant knee pain and requesting help at home for a few hours daily  up to three times weekly,  bathing and supervision so wife go to drug store.   On the visit date of , this patient was evaluated for power mobility needs. This patient has mobility limitation due to  bilateral lower extremity weakness and  chronic right sided hemiplegia from prior CVA   that prevents him from accomplishing ADL's. This cannot be resolved by use of a fitted cane or walker. This patient does not have  sufficient arm strength to propel a wheelchair to perform mobility related ADL's. This patient could benefit from use of specifically powered motorized wheelchair  and can safely transfer to and from a  wheelchair and operate the controls.   Outpatient Medications Prior to Visit  Medication Sig Dispense Refill   acetaminophen  (TYLENOL ) 325 MG tablet Take 2 tablets (650 mg total) by mouth every 6 (six) hours as needed for mild pain (or Fever >/= 101).     aspirin  EC 81 MG tablet Take 81 mg by mouth daily. Swallow whole.     cholecalciferol  (VITAMIN D3) 25 MCG (1000 UNIT) tablet Take 1 tablet (1,000 Units total) by mouth daily. 30 tablet 0   Cyanocobalamin  (B-12 PO) Take by mouth daily in the afternoon.     l-methylfolate-B6-B12 (METANX) 3-35-2 MG TABS tablet Take 1 tablet by mouth daily. 30 tablet 0   latanoprost  (XALATAN ) 0.005 % ophthalmic solution Place 1 drop into both eyes at bedtime.     magnesium  gluconate (MAGONATE) 500 MG tablet Take 0.5 tablets (250 mg total) by mouth at bedtime. 30 tablet 0   metoprolol  succinate (TOPROL -XL) 100 MG 24 hr tablet TAKE 1 TABLET(100 MG) BY MOUTH DAILY WITH OR IMMEDIATELY FOLLOWING A MEAL 90 tablet 1   Multiple Vitamins-Minerals (PRESERVISION AREDS 2 PO) Take by mouth 2 (two)  times daily.     omeprazole  (PRILOSEC) 40 MG capsule TAKE 1 CAPSULE BY MOUTH DAILY 90 capsule 3   sertraline  (ZOLOFT ) 50 MG tablet TAKE 1 TABLET(50 MG) BY MOUTH DAILY 90 tablet 1   donepezil  (ARICEPT ) 10 MG tablet TAKE 1 TABLET(10 MG) BY MOUTH DAILY 90 tablet 1   Lactulose  20 GM/30ML SOLN 30 ml every 4 hours until constipation is relieved 437 mL 3   rosuvastatin  (CRESTOR ) 10 MG tablet Take 1 tablet (10 mg total) by mouth daily. 90 tablet 1   tamsulosin  (FLOMAX ) 0.4 MG CAPS capsule Take 2 capsules (0.8 mg total) by mouth daily after supper. 180 capsule 1   torsemide  (DEMADEX ) 20 MG tablet Take 1 tablet (20 mg total) by mouth every other day. 45 tablet 1   traMADol  (ULTRAM ) 50 MG tablet  Take 1 tablet (50 mg total) by mouth every 12 (twelve) hours as needed for severe pain (pain score 7-10). 15 tablet 0   No facility-administered medications prior to visit.    Review of Systems;  Patient denies headache, fevers, malaise, unintentional weight loss, skin rash, eye pain, sinus congestion and sinus pain, sore throat, dysphagia,  hemoptysis , cough, dyspnea, wheezing, chest pain, palpitations, orthopnea, edema, abdominal pain, nausea, melena, diarrhea, constipation, flank pain, dysuria, hematuria, urinary  Frequency, nocturia, numbness, tingling, seizures,   Loss of consciousness,  Tremor, insomnia, depression, anxiety, and suicidal ideation.      Objective:  BP 120/88   Pulse 68   Ht 5' 10 (1.778 m)   Wt 183 lb (83 kg)   SpO2 95%   BMI 26.26 kg/m   BP Readings from Last 3 Encounters:  07/02/23 120/88  06/26/23 (!) 134/94  05/30/23 110/76    Wt Readings from Last 3 Encounters:  07/02/23 183 lb (83 kg)  06/26/23 189 lb 1.6 oz (85.8 kg)  05/30/23 184 lb (83.5 kg)    Physical Exam Vitals reviewed.  Constitutional:      General: He is not in acute distress.    Appearance: Normal appearance. He is normal weight. He is not ill-appearing, toxic-appearing or diaphoretic.  HENT:     Head: Normocephalic.   Eyes:     General: No scleral icterus.       Right eye: No discharge.        Left eye: No discharge.     Conjunctiva/sclera: Conjunctivae normal.    Cardiovascular:     Rate and Rhythm: Normal rate and regular rhythm.     Heart sounds: Normal heart sounds.  Pulmonary:     Effort: Pulmonary effort is normal. No respiratory distress.     Breath sounds: Normal breath sounds.   Musculoskeletal:        General: Tenderness present. Normal range of motion.       Arms:     Cervical back: Normal range of motion.     Thoracic back: Tenderness present.       Back:     Comments: Bruising over T 11/T12    Skin:    General: Skin is warm and dry.    Neurological:     Mental Status: He is alert and oriented to person, place, and time. Mental status is at baseline.     Motor: Weakness and atrophy present.     Coordination: Coordination abnormal. Heel to Shin Test abnormal.     Gait: Gait abnormal.     Deep Tendon Reflexes: Reflexes abnormal.     Comments: Bilateral lower extremity weakness  Shuffling gait,  Unable to walk with walker for more than 4 steps  without stumbling due to foot drop   Psychiatric:        Mood and Affect: Mood normal.        Behavior: Behavior normal.        Thought Content: Thought content normal.        Judgment: Judgment normal.    Lab Results  Component Value Date   HGBA1C 6.2 01/01/2023   HGBA1C 5.6 03/16/2022   HGBA1C 6.2 11/02/2021    Lab Results  Component Value Date   CREATININE 1.22 07/02/2023   CREATININE 1.26 01/01/2023   CREATININE 1.59 (H) 05/22/2022    Lab Results  Component Value Date   WBC 7.9 01/01/2023   HGB 12.8 (L) 01/01/2023   HCT 39.1 01/01/2023   PLT 225.0 01/01/2023   GLUCOSE 103 (H) 07/02/2023   CHOL 132 07/02/2023   TRIG 115.0 07/02/2023   HDL 37.80 (L) 07/02/2023   LDLDIRECT 78.0 07/02/2023   LDLCALC 71 07/02/2023   ALT 17 07/02/2023   AST 19 07/02/2023   NA 137 07/02/2023   K 4.1 07/02/2023   CL 100 07/02/2023   CREATININE 1.22 07/02/2023   BUN 22 07/02/2023   CO2 30 07/02/2023   TSH 1.42 01/01/2023   PSA 3.32 11/02/2021   INR 1.1 03/15/2022   HGBA1C 6.2 01/01/2023    DG Ribs Unilateral W/Chest Right Result Date: 06/26/2023 CLINICAL DATA:  Right rib pain post fall EXAM: RIGHT RIBS AND CHEST - 3+ VIEW COMPARISON:  None Available. FINDINGS: old fracture deformities of the right tenth rib. Possible fracture deformity involving the anterior eighth and seventh right ribs. IMPRESSION: Rib fractures as described.  No pneumothorax Electronically Signed   By: Fredrich Jefferson M.D.   On: 06/26/2023 08:43    Assessment & Plan:  .Essential hypertension, benign -      Comprehensive metabolic panel with GFR  Dyslipidemia -     Lipid panel -     LDL cholesterol, direct  History of recent fall -     Ambulatory referral to Home Health  Bilateral leg weakness -     Ambulatory referral to Home Health  History of rib fracture -     traMADol  HCl; Take 1 tablet (50 mg total) by mouth every 6 (six) hours as needed for severe pain (pain score 7-10).  Dispense: 60 tablet; Refill: 1 -     Ambulatory referral to Home Health  Balance problem Assessment & Plan: Multifactorial , secondary to prior CVA and right sided hemiplegia, now with progressive left sided weakness .   he does report feeling off balance with sudden positions changes and is taking torsemide  20 mg every other day.  Will reduce dose to 10 mg every other day and follow weights  .  He cannot ambulate with a walker currently;  motorized wheelchair has been recommended to allow him to safely remain at home with wife.  Home PT has also been ordered    Diffuse brain atrophy [facility]) Assessment & Plan: Secondary to  Embolic CVA and cerebrovascular disease,  With mild dementia .  He cannot be left alone.  Home health aide needed for several hours of assistance per week for  showers/hygiene   Frequent falls Assessment & Plan: Secondary to progressive weakness of lower extremities bilaterally  On the visit date of , this patient was evaluated for power mobility needs. This patient has mobility limitation due to prior CVA resulting  in right sided hemiplegia     that prevents him accomplishing ADL's. This cannot be resolved by use of a fitted cane or walker., as he has already demonstrated that a walker is not sufficient to prevent him from falling.   This patient does not have sufficient arm function to propel a wheelchair to perform mobility related ADL's due to right arm weakness.  This patient could benefit from use of specifically configured power wheelchair and can safely transfer to and from a power  wheelchair and operate the controls.    Other orders -     Donepezil  HCl; TAKE 1 TABLET(10 MG) BY MOUTH DAILY  Dispense: 90 tablet; Refill: 1 -     Lactulose ; 30 ml every 4 hours until constipation is relieved  Dispense: 437 mL; Refill: 3 -     Rosuvastatin  Calcium ; Take 1 tablet (10 mg total) by mouth daily.  Dispense: 90 tablet; Refill: 1 -     Tamsulosin  HCl; Take 2 capsules (0.8 mg total) by mouth daily after supper.  Dispense: 180 capsule; Refill: 1 -     Torsemide ; Take 1 tablet (20 mg total) by mouth every other day.  Dispense: 45 tablet; Refill: 1     I spent 34 minutes on the day of this face to face encounter reviewing patient's  most recent  ER visit, counseling family on fall management,  reviewing the assessment and plan with patient, and post visit ordering and reviewing of  diagnostics and therapeutics with patient  .   Follow-up: No follow-ups on file.   Thersia Flax, MD

## 2023-07-02 NOTE — Assessment & Plan Note (Addendum)
 Multifactorial , secondary to prior CVA and right sided hemiplegia, now with progressive left sided weakness .   he does report feeling off balance with sudden positions changes and is taking torsemide  20 mg every other day.  Will reduce dose to 10 mg every other day and follow weights  .  He cannot ambulate with a walker currently;  motorized wheelchair has been recommended to allow him to safely remain at home with wife.  Home PT has also been ordered

## 2023-07-03 ENCOUNTER — Other Ambulatory Visit: Payer: Self-pay | Admitting: Internal Medicine

## 2023-07-03 NOTE — Assessment & Plan Note (Signed)
 Secondary to progressive weakness of lower extremities bilaterally  On the visit date of , this patient was evaluated for power mobility needs. 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 This cannot be resolved by use of a fitted cane or walker., as he has already demonstrated that a walker is not sufficient to prevent him from falling.   This patient does not have sufficient arm function to propel a wheelchair to perform mobility related ADL's due to right arm weakness.  This patient could benefit from use of specifically configured power wheelchair and can safely transfer to and from a power wheelchair and operate the controls.

## 2023-07-05 ENCOUNTER — Ambulatory Visit: Payer: Self-pay | Admitting: Internal Medicine

## 2023-07-05 DIAGNOSIS — F32A Depression, unspecified: Secondary | ICD-10-CM | POA: Diagnosis not present

## 2023-07-05 DIAGNOSIS — R350 Frequency of micturition: Secondary | ICD-10-CM | POA: Diagnosis not present

## 2023-07-05 DIAGNOSIS — E785 Hyperlipidemia, unspecified: Secondary | ICD-10-CM | POA: Diagnosis not present

## 2023-07-05 DIAGNOSIS — I480 Paroxysmal atrial fibrillation: Secondary | ICD-10-CM | POA: Diagnosis not present

## 2023-07-05 DIAGNOSIS — I69391 Dysphagia following cerebral infarction: Secondary | ICD-10-CM | POA: Diagnosis not present

## 2023-07-05 DIAGNOSIS — H409 Unspecified glaucoma: Secondary | ICD-10-CM | POA: Diagnosis not present

## 2023-07-05 DIAGNOSIS — G319 Degenerative disease of nervous system, unspecified: Secondary | ICD-10-CM | POA: Diagnosis not present

## 2023-07-05 DIAGNOSIS — I110 Hypertensive heart disease with heart failure: Secondary | ICD-10-CM | POA: Diagnosis not present

## 2023-07-05 DIAGNOSIS — I69320 Aphasia following cerebral infarction: Secondary | ICD-10-CM | POA: Diagnosis not present

## 2023-07-05 DIAGNOSIS — K219 Gastro-esophageal reflux disease without esophagitis: Secondary | ICD-10-CM | POA: Diagnosis not present

## 2023-07-05 DIAGNOSIS — F01A3 Vascular dementia, mild, with mood disturbance: Secondary | ICD-10-CM | POA: Diagnosis not present

## 2023-07-05 DIAGNOSIS — R351 Nocturia: Secondary | ICD-10-CM | POA: Diagnosis not present

## 2023-07-05 DIAGNOSIS — I5020 Unspecified systolic (congestive) heart failure: Secondary | ICD-10-CM | POA: Diagnosis not present

## 2023-07-05 DIAGNOSIS — N401 Enlarged prostate with lower urinary tract symptoms: Secondary | ICD-10-CM | POA: Diagnosis not present

## 2023-07-05 DIAGNOSIS — K5909 Other constipation: Secondary | ICD-10-CM | POA: Diagnosis not present

## 2023-07-05 DIAGNOSIS — H538 Other visual disturbances: Secondary | ICD-10-CM | POA: Diagnosis not present

## 2023-07-05 DIAGNOSIS — H9193 Unspecified hearing loss, bilateral: Secondary | ICD-10-CM | POA: Diagnosis not present

## 2023-07-05 DIAGNOSIS — N135 Crossing vessel and stricture of ureter without hydronephrosis: Secondary | ICD-10-CM | POA: Diagnosis not present

## 2023-07-05 DIAGNOSIS — H3530 Unspecified macular degeneration: Secondary | ICD-10-CM | POA: Diagnosis not present

## 2023-07-05 DIAGNOSIS — R7303 Prediabetes: Secondary | ICD-10-CM | POA: Diagnosis not present

## 2023-07-05 DIAGNOSIS — I25118 Atherosclerotic heart disease of native coronary artery with other forms of angina pectoris: Secondary | ICD-10-CM | POA: Diagnosis not present

## 2023-07-05 DIAGNOSIS — D6869 Other thrombophilia: Secondary | ICD-10-CM | POA: Diagnosis not present

## 2023-07-05 DIAGNOSIS — I69351 Hemiplegia and hemiparesis following cerebral infarction affecting right dominant side: Secondary | ICD-10-CM | POA: Diagnosis not present

## 2023-07-05 DIAGNOSIS — S2241XD Multiple fractures of ribs, right side, subsequent encounter for fracture with routine healing: Secondary | ICD-10-CM | POA: Diagnosis not present

## 2023-07-08 ENCOUNTER — Telehealth: Payer: Self-pay

## 2023-07-08 NOTE — Telephone Encounter (Signed)
 Spoke with pt's wife and she state that pt did fall but is not in any pain. I asked wife if she received our message in regards to the motorized wheelchair. Wife stated that she does not want the motorized wheelchair, she has bought him a regular wheelchair.

## 2023-07-08 NOTE — Telephone Encounter (Signed)
 Copied from CRM [id number]. Topic: General - Other >> Jul 08, 2023  9:47 AM Antonieta Kitten wrote: Reason for CRM: Stephine from [facility] calling in to inform that pt did have a fall on back and bottom within his home. Patient states he's in no pain. Her contact number is [phone] if any questions

## 2023-07-14 DIAGNOSIS — I69351 Hemiplegia and hemiparesis following cerebral infarction affecting right dominant side: Secondary | ICD-10-CM | POA: Diagnosis not present

## 2023-07-14 DIAGNOSIS — M2062 Acquired deformities of toe(s), unspecified, left foot: Secondary | ICD-10-CM

## 2023-07-14 DIAGNOSIS — I25118 Atherosclerotic heart disease of native coronary artery with other forms of angina pectoris: Secondary | ICD-10-CM | POA: Diagnosis not present

## 2023-07-14 DIAGNOSIS — K5909 Other constipation: Secondary | ICD-10-CM

## 2023-07-14 DIAGNOSIS — Z9181 History of falling: Secondary | ICD-10-CM

## 2023-07-14 DIAGNOSIS — R351 Nocturia: Secondary | ICD-10-CM

## 2023-07-14 DIAGNOSIS — I480 Paroxysmal atrial fibrillation: Secondary | ICD-10-CM | POA: Diagnosis not present

## 2023-07-14 DIAGNOSIS — J3081 Allergic rhinitis due to animal (cat) (dog) hair and dander: Secondary | ICD-10-CM

## 2023-07-14 DIAGNOSIS — N401 Enlarged prostate with lower urinary tract symptoms: Secondary | ICD-10-CM

## 2023-07-14 DIAGNOSIS — H3530 Unspecified macular degeneration: Secondary | ICD-10-CM

## 2023-07-14 DIAGNOSIS — Z860100 Personal history of colon polyps, unspecified: Secondary | ICD-10-CM

## 2023-07-14 DIAGNOSIS — H409 Unspecified glaucoma: Secondary | ICD-10-CM

## 2023-07-14 DIAGNOSIS — W19XXXD Unspecified fall, subsequent encounter: Secondary | ICD-10-CM

## 2023-07-14 DIAGNOSIS — H538 Other visual disturbances: Secondary | ICD-10-CM

## 2023-07-14 DIAGNOSIS — I69391 Dysphagia following cerebral infarction: Secondary | ICD-10-CM | POA: Diagnosis not present

## 2023-07-14 DIAGNOSIS — Z7982 Long term (current) use of aspirin: Secondary | ICD-10-CM

## 2023-07-14 DIAGNOSIS — R350 Frequency of micturition: Secondary | ICD-10-CM

## 2023-07-14 DIAGNOSIS — S2241XD Multiple fractures of ribs, right side, subsequent encounter for fracture with routine healing: Secondary | ICD-10-CM | POA: Diagnosis not present

## 2023-07-14 DIAGNOSIS — K219 Gastro-esophageal reflux disease without esophagitis: Secondary | ICD-10-CM

## 2023-07-14 DIAGNOSIS — N135 Crossing vessel and stricture of ureter without hydronephrosis: Secondary | ICD-10-CM

## 2023-07-14 DIAGNOSIS — G319 Degenerative disease of nervous system, unspecified: Secondary | ICD-10-CM | POA: Diagnosis not present

## 2023-07-14 DIAGNOSIS — D6869 Other thrombophilia: Secondary | ICD-10-CM

## 2023-07-14 DIAGNOSIS — I69320 Aphasia following cerebral infarction: Secondary | ICD-10-CM | POA: Diagnosis not present

## 2023-07-14 DIAGNOSIS — F32A Depression, unspecified: Secondary | ICD-10-CM | POA: Diagnosis not present

## 2023-07-14 DIAGNOSIS — F01A3 Vascular dementia, mild, with mood disturbance: Secondary | ICD-10-CM | POA: Diagnosis not present

## 2023-07-14 DIAGNOSIS — E785 Hyperlipidemia, unspecified: Secondary | ICD-10-CM | POA: Diagnosis not present

## 2023-07-14 DIAGNOSIS — H9193 Unspecified hearing loss, bilateral: Secondary | ICD-10-CM

## 2023-07-14 DIAGNOSIS — R7303 Prediabetes: Secondary | ICD-10-CM

## 2023-07-14 DIAGNOSIS — I5020 Unspecified systolic (congestive) heart failure: Secondary | ICD-10-CM | POA: Diagnosis not present

## 2023-07-14 DIAGNOSIS — I110 Hypertensive heart disease with heart failure: Secondary | ICD-10-CM | POA: Diagnosis not present

## 2023-07-31 ENCOUNTER — Ambulatory Visit: Admitting: Podiatry

## 2023-07-31 ENCOUNTER — Encounter: Payer: Self-pay | Admitting: Podiatry

## 2023-07-31 VITALS — Ht 70.0 in | Wt 183.0 lb

## 2023-07-31 DIAGNOSIS — L84 Corns and callosities: Secondary | ICD-10-CM

## 2023-07-31 DIAGNOSIS — M79674 Pain in right toe(s): Secondary | ICD-10-CM | POA: Diagnosis not present

## 2023-07-31 DIAGNOSIS — D689 Coagulation defect, unspecified: Secondary | ICD-10-CM | POA: Diagnosis not present

## 2023-07-31 DIAGNOSIS — M79675 Pain in left toe(s): Secondary | ICD-10-CM

## 2023-07-31 DIAGNOSIS — B351 Tinea unguium: Secondary | ICD-10-CM | POA: Diagnosis not present

## 2023-07-31 NOTE — Progress Notes (Signed)
 Subjective:  Patient ID: Todd LELON Donnell Mickey., male    DOB: January 16, 1931,  MRN: [id number]  Todd LELON Donnell Mickey. presents to clinic today for at risk foot care with h/o clotting disorder and corn(s) b/l feet and painful mycotic toenails that are difficult to trim. Painful toenails interfere with ambulation. Aggravating factors include wearing enclosed shoe gear. Pain is relieved with periodic professional debridement. Painful corns are aggravated when weightbearing when wearing enclosed shoe gear. Pain is relieved with periodic professional debridement. He is accompanied by his wife on today's visit. Chief Complaint  Patient presents with   Nail Problem    Pt is here for [facility] PCP is Dr Marylynn and LOV was in June.   New problem(s): None.   PCP is [name], MD.  No Known Allergies  Review of Systems: Negative except as noted in the HPI.  Objective: No changes noted in today's physical examination. There were no vitals filed for this visit. Todd LELON Donnell Mickey. is a pleasant 89 y.o. male WD, WN in NAD. AAO x 3.  Vascular Examination: Capillary refill time immediate b/l. Palpable pedal pulses. Pedal hair absent b/l. No pain with calf compression b/l. Skin temperature gradient WNL b/l. No cyanosis or clubbing b/l. No ischemia or gangrene noted b/l. No edema noted b/l LE.  Neurological Examination: Sensation grossly intact b/l with 10 gram monofilament. Vibratory sensation intact b/l.   Dermatological Examination: Pedal skin with normal turgor, texture and tone b/l.  No open wounds. No interdigital macerations.   Toenails 1-5 b/l thick, discolored, elongated with subungual debris and pain on dorsal palpation.   Preulcerative lesion noted distal tip of right 3rd toe and dorsal PIPJ of L 4th toe. There is visible subdermal hemorrhage. There is no surrounding erythema, no edema, no drainage, no odor, no fluctuance.  Musculoskeletal Examination: Muscle strength 5/5 to all lower extremity  muscle groups bilaterally. No pain, crepitus or joint limitation noted with ROM b/l LE. Hammertoe(s) L 4th toe. Clawtoe deformity right third digit.. Patient ambulates with rollator assistance.  Radiographs: None  Last A1c:      Latest Ref Rng & Units 01/01/2023    9:58 AM  Hemoglobin A1C  Hemoglobin-A1c 4.6 - 6.5 % 6.2     Assessment/Plan: 1. Pain due to onychomycosis of toenails of both feet   2. Pre-ulcerative corn or callous   3. Clotting disorder [facility])    -Patient's family member present. All questions/concerns addressed on today's visit. -Consent given for treatment as described below: -Examined patient. -Patient to continue soft, supportive shoe gear daily. -Toenails 1-5 bilaterally were debrided in length and girth with sterile nail nippers and dremel. Pinpoint bleeding of right third digit addressed with Lumicain Hemostatic Solution, cleansed with alcohol.  No further treatment required by patient/caregiver. -Preulcerative lesion pared distal tip of right 3rd toe and dorsal PIPJ of left fourth digit utilizing sterile scalpel blade. Total number pared=2. -Patient/POA to call should there be question/concern in the interim.   Return in about 3 months (around 10/31/2023).  Todd Golden, DPM      [place] LOCATION: [address], [place] [address]  Office [phone]   [facility] LOCATION: [address] [place], [place] [address] Office [phone]

## 2023-08-01 ENCOUNTER — Ambulatory Visit: Payer: Self-pay

## 2023-08-01 NOTE — Telephone Encounter (Signed)
 She stated that she got the blood to stop last night

## 2023-08-01 NOTE — Telephone Encounter (Signed)
 See Encounter from 08/01/2023

## 2023-08-01 NOTE — Telephone Encounter (Signed)
 FYI Only or Action Required?: Action required by provider: f/u with patient.  Patient was last seen in primary care on 07/02/2023 by [name], MD.  Called Nurse Triage reporting Fall on 07/31/23  Symptoms began yesterday.  Interventions attempted: Nothing.  Symptoms are: Denies symptoms.  Triage Disposition: Go to ED Now (Notify PCP) - Refused, PCP office notified via CAL line.  Patient/caregiver understands and will follow disposition?: No, refuses disposition   Copied from CRM [id number]. Topic: Clinical - Red Word Triage >> Aug 01, 2023  2:57 PM Todd Golden wrote: Red Word that prompted transfer to Nurse Triage: [facility] is calling to let the provider know the patient had a fall on 7/10 around 1:00- 2:00 pm. He has a cut behind his left ear and dry blood from the fall. He is not in any pain was advised by [facility] employee soni to get the fall checked out at er or urgent care.     Reason for Disposition  Injury (or injuries) that need emergency care  Answer Assessment - Initial Assessment Questions This RN spoke with patient's wife Todd Golden. She states that the patient has no complaints since the fall. She refused ED recommendation x multiple attempts made with rationale. Advised to call 911 with any changes including complaint of headache, confusion, ataxia, changes in mental status. This RN advised wife that the PCP office would be notified.   1. MECHANISM: How did the fall happen?     Fall reported by [facility], stated that patient had fall 07/31/23, mechanism not noted.  3. ONSET: When did the fall happen? (e.g., minutes, hours, or days ago)     Reported by [facility] as 07/31/23 at 1-2pm  4. LOCATION: What part of the body hit the ground? (e.g., back, buttocks, head, hips, knees, hands, head, stomach)     Left ear  5. INJURY: Did you hurt (injure) yourself when you fell? If Yes, ask: What did you injure? Tell me more about this? (e.g., body area; type of  injury; pain severity)     [facility] reported bleeding behind left ear  6. PAIN: Is there any pain? If Yes, ask: How bad is the pain? (e.g., Scale 0-10; or none, mild,      Patient's wife stated he has no complaints of pain.   9. OTHER SYMPTOMS: Do you have any other symptoms? (e.g., dizziness, fever, weakness; new-onset or worsening).      Patient's wife denies any complaints on behalf of the patient.  Protocols used: Falls and [facility]

## 2023-08-01 NOTE — Telephone Encounter (Unsigned)
 Copied from CRM [id number]. Topic: Clinical - Red Word Triage >> Aug 01, 2023  2:57 PM Deleta RAMAN wrote: Red Word that prompted transfer to Nurse Triage: [facility] is calling to let the provider know the patient had a fall on 7/10 around 1:00- 2:00 pm. He has a cut behind his left ear and dry blood from the fall. He is not in any pain was advised by [facility] employee soni to get the fall checked out at er or urgent care.

## 2023-08-01 NOTE — Telephone Encounter (Signed)
 Called pt and spoke to wife she stated that he hit his head and cut behind left ear and bleed pretty bad. Pt is only on aspirin . His wife stated that  he acting ok, and not complaining of dizziness, headaches, or any pain. I did advised that he he starts having headache, blurred vision, or confusion to take straight to the ER.

## 2023-08-01 NOTE — Telephone Encounter (Signed)
 See previous message

## 2023-08-06 DIAGNOSIS — H353211 Exudative age-related macular degeneration, right eye, with active choroidal neovascularization: Secondary | ICD-10-CM | POA: Diagnosis not present

## 2023-08-06 DIAGNOSIS — H40053 Ocular hypertension, bilateral: Secondary | ICD-10-CM | POA: Diagnosis not present

## 2023-10-31 ENCOUNTER — Ambulatory Visit: Admitting: Podiatry

## 2023-10-31 DIAGNOSIS — L84 Corns and callosities: Secondary | ICD-10-CM | POA: Diagnosis not present

## 2023-10-31 DIAGNOSIS — D689 Coagulation defect, unspecified: Secondary | ICD-10-CM | POA: Diagnosis not present

## 2023-10-31 DIAGNOSIS — M79675 Pain in left toe(s): Secondary | ICD-10-CM

## 2023-10-31 DIAGNOSIS — M79674 Pain in right toe(s): Secondary | ICD-10-CM | POA: Diagnosis not present

## 2023-10-31 DIAGNOSIS — B351 Tinea unguium: Secondary | ICD-10-CM

## 2023-11-08 ENCOUNTER — Encounter: Payer: Self-pay | Admitting: Podiatry

## 2023-11-08 NOTE — Progress Notes (Signed)
  Subjective:  Patient ID: Todd LELON Donnell Mickey., male    DOB: 30-May-1930,  MRN: [id number]  Todd LELON Donnell Mickey. presents to clinic today for at risk foot care with h/o clotting disorder and corn(s) left foot and painful mycotic toenails that are difficult to trim. Painful toenails interfere with ambulation. Aggravating factors include wearing enclosed shoe gear. Pain is relieved with periodic professional debridement. Painful corns are aggravated when weightbearing when wearing enclosed shoe gear. Pain is relieved with periodic professional debridement.  Chief Complaint  Patient presents with   Toe Pain    [facility]- Dr. Marylynn is his PCP. Last visit was in June   New problem(s): None.   PCP is Todd Verneita CROME, MD.  No Known Allergies  Review of Systems: Negative except as noted in the HPI.  Objective: No changes noted in today's physical examination. There were no vitals filed for this visit. Todd LELON Donnell Mickey. is a pleasant 89 y.o. male in NAD. AAO x 3.  Vascular Examination: Capillary refill time immediate b/l. Palpable pedal pulses. Pedal hair absent b/l. No pain with calf compression b/l. Skin temperature gradient WNL b/l. No cyanosis or clubbing b/l. No ischemia or gangrene noted b/l. No edema noted b/l LE.  Neurological Examination: Sensation grossly intact b/l with 10 gram monofilament. Vibratory sensation intact b/l.   Dermatological Examination: Pedal skin with normal turgor, texture and tone b/l.  No open wounds. No interdigital macerations.   Toenails 1-5 b/l thick, discolored, elongated with subungual debris and pain on dorsal palpation.   Preulcerative lesion noted distal tip of right 3rd toe and dorsal PIPJ of L 4th toe. There is visible subdermal hemorrhage. There is no surrounding erythema, no edema, no drainage, no odor, no fluctuance.  Musculoskeletal Examination: Muscle strength 5/5 to all lower extremity muscle groups bilaterally. No pain, crepitus or joint  limitation noted with ROM b/l LE. Hammertoe(s) L 4th toe. Clawtoe deformity right third digit.. Patient ambulates with rollator assistance.  Radiographs: None Assessment/Plan: 1. Corns   2. Clotting disorder   3. Pain due to onychomycosis of toenails of both feet   Consent given for treatment. Patient examined. All patient's and/or POA's questions/concerns addressed on today's visit. Mycotic toenails 1-5 b/l debrided in length and girth without incident. Corn(s) distal tip of left 4th toe pared with sharp debridement without incident.Continue soft, supportive shoe gear daily. Report any pedal injuries to medical professional. Call office if there are any quesitons/concerns.  Return in about 3 months (around 01/31/2024).  Delon Golden Merlin, DPM      [place] LOCATION: [address] [place] [address]                   Office [phone]   [facility] LOCATION: [address] [place], [place] [address] Office [phone]

## 2023-11-14 DIAGNOSIS — H353211 Exudative age-related macular degeneration, right eye, with active choroidal neovascularization: Secondary | ICD-10-CM | POA: Diagnosis not present

## 2023-11-25 ENCOUNTER — Ambulatory Visit: Payer: Self-pay

## 2023-11-25 NOTE — Telephone Encounter (Signed)
 FYI Only or Action Required?: FYI only for provider: appointment scheduled on 01/19/24-OV. Wife reports he has frequent falls, provider aware and she does not want to schedule acute visit at this time.  Patient was last seen in primary care on 07/02/2023 by [name], MD.  Called Nurse Triage reporting Fall.  Symptoms began Ongoing, provider aware per wife.  Interventions attempted: Rest, hydration, or home remedies.  Symptoms are: unchanged.  Triage Disposition: See PCP Within 2 Weeks  Patient/caregiver understands and will follow disposition?: Yes      Copied from CRM [id number]. Topic: Clinical - Red Word Triage >> Nov 25, 2023  8:52 AM Rea ORN wrote: Red Word that prompted transfer to Nurse Triage: Fall on Monday, has bruising Reason for Disposition  [1] Falling (two or more unexpected falls) AND [2] in past year  Answer Assessment - Initial Assessment Questions 1. MECHANISM: How did the fall happen?     Unsteady on feet 2. DOMESTIC VIOLENCE AND ELDER ABUSE SCREENING: Did you fall because someone pushed you or tried to hurt you? If Yes, ask: Are you safe now?     None 3. ONSET: When did the fall happen? (e.g., minutes, hours, or days ago)     Nilsa was most recent fall 4. LOCATION: What part of the body hit the ground? (e.g., back, buttocks, head, hips, knees, hands, head, stomach)     Lost balance and sat on floor 5. INJURY: Did you hurt (injure) yourself when you fell? If Yes, ask: What did you injure? Tell me more about this? (e.g., body area; type of injury; pain severity)     None 6. PAIN: Is there any pain? If Yes, ask: How bad is the pain? (e.g., Scale 0-10; or none, mild,      None 7. SIZE: For cuts, bruises, or swelling, ask: How large is it? (e.g., inches or centimeters)      Bruise on left arm 9. OTHER SYMPTOMS: Do you have any other symptoms? (e.g., dizziness, fever, weakness; new-onset or worsening).      None 10. CAUSE: What  do you think caused the fall (or falling)? (e.g., dizzy spell, tripped)       Pt falls often, unsteady on feet per wife  Protocols used: Falls and [facility]

## 2023-12-04 ENCOUNTER — Telehealth: Payer: Self-pay

## 2023-12-04 NOTE — Telephone Encounter (Signed)
 Pt's wife is in need of a letter on a letterhead stating the pt is unable to handle his own finances. She is needing the letter by Monday monring at 9 am. Dr. Marylynn is out of the office until Monday. Pt was advised that we would contact her when the letter was ready for pick up.

## 2023-12-05 ENCOUNTER — Other Ambulatory Visit: Payer: Self-pay | Admitting: Nurse Practitioner

## 2023-12-05 NOTE — Telephone Encounter (Signed)
 Spoke to pt wife placed letter upfront for her to pickup today 12/05/23

## 2023-12-05 NOTE — Telephone Encounter (Unsigned)
 Copied from CRM #[id number]. Topic: General - Other >> Dec 05, 2023 11:33 AM Todd Golden wrote: Reason for CRM: Patient wants to know if the letter for social security is ready for pickup.  Please call once ready.

## 2023-12-14 ENCOUNTER — Other Ambulatory Visit: Payer: Self-pay | Admitting: Internal Medicine

## 2023-12-16 ENCOUNTER — Other Ambulatory Visit: Payer: Self-pay | Admitting: Internal Medicine

## 2024-01-03 ENCOUNTER — Other Ambulatory Visit: Payer: Self-pay | Admitting: Internal Medicine

## 2024-01-05 ENCOUNTER — Ambulatory Visit: Attending: Medical | Admitting: Medical

## 2024-01-05 ENCOUNTER — Encounter: Payer: Self-pay | Admitting: Medical

## 2024-01-05 VITALS — BP 130/70 | HR 74 | Ht 71.0 in | Wt 181.8 lb

## 2024-01-05 DIAGNOSIS — I5022 Chronic systolic (congestive) heart failure: Secondary | ICD-10-CM | POA: Diagnosis not present

## 2024-01-05 DIAGNOSIS — I4821 Permanent atrial fibrillation: Secondary | ICD-10-CM | POA: Diagnosis not present

## 2024-01-05 DIAGNOSIS — I10 Essential (primary) hypertension: Secondary | ICD-10-CM

## 2024-01-05 NOTE — Patient Instructions (Addendum)
 Medication Instructions:  Continue current medications. *If you need a refill on your cardiac medications before your next appointment, please call your pharmacy*  Lab Work: No labs order. If you have labs (blood work) drawn today and your tests are completely normal, you will receive your results only by: MyChart Message (if you have MyChart) OR A paper copy in the mail If you have any lab test that is abnormal or we need to change your treatment, we will call you to review the results.  Testing/Procedures: No testing order.  Follow-Up: At [facility], you and your health needs are our priority.  As part of our continuing mission to provide you with exceptional heart care, our providers are all part of one team.  This team includes your primary Cardiologist (physician) and Advanced Practice Providers or APPs (Physician Assistants and Nurse Practitioners) who all work together to provide you with the care you need, when you need it.  Your next appointment:   6 month(s)  Provider:   Deatrice Cage, MD or Cadence Franchester, [place]    We recommend signing up for the patient portal called MyChart.  Sign up information is provided on this After Visit Summary.  MyChart is used to connect with patients for Virtual Visits (Telemedicine).  Patients are able to view lab/test results, encounter notes, upcoming appointments, etc.  Non-urgent messages can be sent to your provider as well.   To learn more about what you can do with MyChart, go to [email or web address].

## 2024-01-05 NOTE — Progress Notes (Signed)
 Cardiology Office Note   Date:  01/05/2024  ID:  Todd Golden., DOB Dec 27, 1930, MRN [id number] PCP: Todd Verneita CROME, MD  [facility] Providers Cardiologist:  Deatrice Cage, MD   History of Present Illness Todd Golden. is a 89 y.o. male with a history of permanent atrial fibrillation, chronic systolic heart failure, stroke resulting in aphasia and right sided weakness, hypertension, hyperlipidemia, and glaucoma who presents for follow-up of Afib and CHF.     November 2015, he was admitted with expressive aphasia and mild ataxia due to left MCA stroke in the setting of A-fib.  Echo showed normal LV function with carotid Doppler showing no obstructive disease.  He was placed on Eliquis .  He was taken off anticoagulation due to recurrent falls and dementia.   He was hospitalized in January 2024 with a stroke.  He was hospitalized again in April with shortness of breath noted to be in A-fib RVR with decompensated heart failure.  He improved with diuresis.  Echo showed EF of 35 to 40%.  Reduced EF was felt to be tachycardia mediated.  Rate control was difficult and ultimately he was given a few doses of digoxin  and amiodarone  was added to Toprol .  His blood pressure was elevated and Cardura  was added.  Hydrochlorothiazide  was discontinued due to torsemide .  Amiodarone  was subsequently discontinued and Cardura  was stopped due to orthostatic dizziness.  The patient was last seen 05/30/2023 and was stable from a cardiac perspective.  Today,the patient is overall doing OK. No chest pain, SOB, lower leg edema, lightheadedness, dizziness. He falls a lot from losing balance. He uses a rollator. He does exercises at home. Eating and drinking normally.  No recent fever, chills, nausea, or vomiting.   Studies Reviewed EKG Interpretation Date/Time:  Monday January 05 2024 08:54:27 EST Ventricular Rate:  74 PR Interval:    QRS Duration:  96 QT Interval:  414 QTC Calculation: 459 R  Axis:   -50  Text Interpretation: Atrial fibrillation Left axis deviation Minimal voltage criteria for LVH, may be normal variant ( R in aVL ) Nonspecific ST and T wave abnormality When compared with ECG of 26-Jun-2023 07:23, PREVIOUS ECG IS PRESENT Confirmed by Franchester, Shanna Un ([id number]) on 01/05/2024 8:57:58 AM    Echo 04/2022 1. Left ventricular ejection fraction, by estimation, is 30 to 35%. Left  ventricular ejection fraction by PLAX is 27 %. The left ventricle has  moderately decreased function. The left ventricle demonstrates global  hypokinesis. Left ventricular diastolic  parameters are indeterminate.   2. Right ventricular systolic function is normal. The right ventricular  size is normal. Tricuspid regurgitation signal is inadequate for assessing  PA pressure.   3. Left atrial size was mild to moderately dilated.   4. The mitral valve is normal in structure. Mild mitral valve  regurgitation. No evidence of mitral stenosis.   5. The aortic valve is normal in structure. There is mild calcification  of the aortic valve. Aortic valve regurgitation is not visualized. Aortic  valve sclerosis/calcification is present, without any evidence of aortic  stenosis.   6. There is borderline dilatation of the aortic root, measuring 39 mm.   7. The inferior vena cava is normal in size with greater than 50%  respiratory variability, suggesting right atrial pressure of 3 mmHg.   8. Rhythm is atrial fibrillation      Echo 02/2022 1. Left ventricular ejection fraction, by estimation, is 40 to 45%. The  left ventricle has mildly  decreased function. The left ventricle  demonstrates global hypokinesis. Left ventricular diastolic function could  not be evaluated.   2. Right ventricular systolic function is normal. The right ventricular  size is normal. There is normal pulmonary artery systolic pressure. The  estimated right ventricular systolic pressure is 19.2 mmHg.   3. The mitral valve is normal in  structure. Trivial mitral valve  regurgitation. No evidence of mitral stenosis.   4. The aortic valve is normal in structure. Aortic valve regurgitation is  not visualized. Aortic valve sclerosis/calcification is present, without  any evidence of aortic stenosis. Aortic valve area, by VTI measures 2.26  cm. Aortic valve mean gradient  measures 2.7 mmHg. Aortic valve Vmax measures 1.15 m/s.   5. Aortic dilatation noted. There is mild dilatation of the ascending  aorta, measuring 41 mm.   6. The inferior vena cava is normal in size with greater than 50%  respiratory variability, suggesting right atrial pressure of 3 mmHg.       Physical Exam VS:  BP 130/70 (BP Location: Left Arm, Patient Position: Sitting, Cuff Size: Normal)   Pulse 74 Comment: 57 oximeter  Ht 5' 11 (1.803 m)   Wt 181 lb 12.8 oz (82.5 kg)   SpO2 98%   BMI 25.36 kg/m        Wt Readings from Last 3 Encounters:  01/05/24 181 lb 12.8 oz (82.5 kg)  07/31/23 183 lb (83 kg)  07/02/23 183 lb (83 kg)    GEN: Well nourished, well developed in no acute distress NECK: No JVD; No carotid bruits CARDIAC: Irreg Irreg, no murmurs, rubs, gallops RESPIRATORY:  Clear to auscultation without rales, wheezing or rhonchi  ABDOMEN: Soft, non-tender, non-distended EXTREMITIES:  No edema; No deformity   ASSESSMENT AND PLAN  Permanent Afib Patient is in rate controlled A-fib on EKG.  He is not on anticoagulation due to frequent falls.  He uses a rollator and does exercises at home.  Continue low-dose aspirin .  He is not a candidate for watchman due to comorbidities.  Continue Toprol  100 mg daily for rate control.  HTN Blood pressure is good today, continue Toprol  100 mg daily.  Chronic systolic heart failure Most recent echocardiogram in 2024 showed LVEF of 30 to 35%, suspected tachycardia mediated.  Discussed repeat echocardiogram, however wife would like to wait at this time.  Patient is euvolemic on exam.  Continue torsemide  20  mg every other day and Toprol  100 mg daily.       Dispo: Follow-up in 6 months  Signed, Hamp Moreland VEAR Fishman, PA-C

## 2024-01-12 ENCOUNTER — Other Ambulatory Visit: Payer: Self-pay

## 2024-01-12 MED ORDER — TORSEMIDE 20 MG PO TABS: 20.0000 mg | ORAL_TABLET | ORAL | 1 refills | Status: AC

## 2024-01-12 MED ORDER — DONEPEZIL HCL 10 MG PO TABS: ORAL_TABLET | ORAL | 1 refills | Status: AC

## 2024-01-19 ENCOUNTER — Encounter: Payer: Self-pay | Admitting: Internal Medicine

## 2024-01-19 ENCOUNTER — Ambulatory Visit: Admitting: Internal Medicine

## 2024-01-19 ENCOUNTER — Ambulatory Visit

## 2024-01-19 VITALS — BP 122/70 | HR 95 | Ht 71.0 in | Wt 182.4 lb

## 2024-01-19 DIAGNOSIS — M25552 Pain in left hip: Secondary | ICD-10-CM

## 2024-01-19 DIAGNOSIS — M25551 Pain in right hip: Secondary | ICD-10-CM

## 2024-01-19 DIAGNOSIS — F0153 Vascular dementia, unspecified severity, with mood disturbance: Secondary | ICD-10-CM

## 2024-01-19 DIAGNOSIS — R7303 Prediabetes: Secondary | ICD-10-CM

## 2024-01-19 DIAGNOSIS — R29898 Other symptoms and signs involving the musculoskeletal system: Secondary | ICD-10-CM | POA: Diagnosis not present

## 2024-01-19 DIAGNOSIS — I10 Essential (primary) hypertension: Secondary | ICD-10-CM | POA: Diagnosis not present

## 2024-01-19 DIAGNOSIS — R2689 Other abnormalities of gait and mobility: Secondary | ICD-10-CM

## 2024-01-19 DIAGNOSIS — E785 Hyperlipidemia, unspecified: Secondary | ICD-10-CM

## 2024-01-19 DIAGNOSIS — R5383 Other fatigue: Secondary | ICD-10-CM | POA: Diagnosis not present

## 2024-01-19 LAB — LIPID PANEL
Cholesterol: 130 mg/dL (ref 28–200)
HDL: 40.9 mg/dL
LDL Cholesterol: 63 mg/dL (ref 10–99)
NonHDL: 89.13
Total CHOL/HDL Ratio: 3
Triglycerides: 129 mg/dL (ref 10.0–149.0)
VLDL: 25.8 mg/dL (ref 0.0–40.0)

## 2024-01-19 LAB — CBC WITH DIFFERENTIAL/PLATELET
Basophils Absolute: 0 K/uL (ref 0.0–0.1)
Basophils Relative: 0.4 % (ref 0.0–3.0)
Eosinophils Absolute: 0.1 K/uL (ref 0.0–0.7)
Eosinophils Relative: 2 % (ref 0.0–5.0)
HCT: 42.7 % (ref 39.0–52.0)
Hemoglobin: 14.1 g/dL (ref 13.0–17.0)
Lymphocytes Relative: 24.4 % (ref 12.0–46.0)
Lymphs Abs: 1.8 K/uL (ref 0.7–4.0)
MCHC: 33 g/dL (ref 30.0–36.0)
MCV: 90.4 fl (ref 78.0–100.0)
Monocytes Absolute: 0.8 K/uL (ref 0.1–1.0)
Monocytes Relative: 11.4 % (ref 3.0–12.0)
Neutro Abs: 4.4 K/uL (ref 1.4–7.7)
Neutrophils Relative %: 61.8 % (ref 43.0–77.0)
Platelets: 242 K/uL (ref 150.0–400.0)
RBC: 4.72 Mil/uL (ref 4.22–5.81)
RDW: 13.7 % (ref 11.5–15.5)
WBC: 7.2 K/uL (ref 4.0–10.5)

## 2024-01-19 LAB — MICROALBUMIN / CREATININE URINE RATIO
Creatinine,U: 44.8 mg/dL
Microalb Creat Ratio: 77.1 mg/g — ABNORMAL HIGH (ref 0.0–30.0)
Microalb, Ur: 3.5 mg/dL — ABNORMAL HIGH (ref 0.7–1.9)

## 2024-01-19 LAB — COMPREHENSIVE METABOLIC PANEL WITH GFR
ALT: 18 U/L (ref 3–53)
AST: 20 U/L (ref 5–37)
Albumin: 4.5 g/dL (ref 3.5–5.2)
Alkaline Phosphatase: 86 U/L (ref 39–117)
BUN: 19 mg/dL (ref 6–23)
CO2: 31 meq/L (ref 19–32)
Calcium: 9.4 mg/dL (ref 8.4–10.5)
Chloride: 100 meq/L (ref 96–112)
Creatinine, Ser: 1.11 mg/dL (ref 0.40–1.50)
GFR: 58.04 mL/min — ABNORMAL LOW
Glucose, Bld: 107 mg/dL — ABNORMAL HIGH (ref 70–99)
Potassium: 4.4 meq/L (ref 3.5–5.1)
Sodium: 140 meq/L (ref 135–145)
Total Bilirubin: 0.8 mg/dL (ref 0.2–1.2)
Total Protein: 7.4 g/dL (ref 6.0–8.3)

## 2024-01-19 LAB — LDL CHOLESTEROL, DIRECT: Direct LDL: 75 mg/dL

## 2024-01-19 LAB — HEMOGLOBIN A1C: Hgb A1c MFr Bld: 6.2 % (ref 4.6–6.5)

## 2024-01-19 LAB — TSH: TSH: 2.51 u[IU]/mL (ref 0.35–5.50)

## 2024-01-19 MED ORDER — MELOXICAM 7.5 MG PO TABS: 7.5000 mg | ORAL_TABLET | Freq: Every day | ORAL | 0 refills | Status: DC

## 2024-01-19 MED ORDER — METOPROLOL SUCCINATE ER 100 MG PO TB24: ORAL_TABLET | ORAL | 1 refills | Status: AC

## 2024-01-19 MED ORDER — SERTRALINE HCL 100 MG PO TABS: 100.0000 mg | ORAL_TABLET | Freq: Every day | ORAL | 1 refills | Status: AC

## 2024-01-19 NOTE — Assessment & Plan Note (Signed)
 Adding meloxicam.  Plain films of hip and pelvis ordered to rule out SI fractures.

## 2024-01-19 NOTE — Patient Instructions (Addendum)
 I am prescribing meloxicam to take once daily with food.   Please increase your tylenol  dose as well.  You can take 3000 mg of acetominophen (tylenol ) every day safely  In divided doses ( Or 1000 mg every 8 hours.)   You MUST do the exercises that you were given in the past by PHYSICAL THERAPY   3 times daily

## 2024-01-19 NOTE — Assessment & Plan Note (Addendum)
 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  He is able to rise from a chair with some assistance, and ambulates  with a rolling walker but continues to have falls . His last home PT session was completed in August after June 2025 fall resulted in broken ribs.

## 2024-01-19 NOTE — Assessment & Plan Note (Signed)
 I have increased his sertraline  dose to 100 mg daily to address the increased irritability his wife reports due to mounting frustration due to his aphasia

## 2024-01-19 NOTE — Progress Notes (Unsigned)
 "  Subjective:  Patient ID: Todd Golden., male    DOB: [birth year]  Age: 89 y.o. MRN: [id number]  CC: The primary encounter diagnosis was Essential hypertension, benign. Diagnoses of Dyslipidemia, Prediabetes, Other fatigue, Posterior pain of right hip, Posterior pain of left hip, Right leg weakness, Balance problem, 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HCC), and Pain of right hip joint were also pertinent to this visit.   HPI Todd Golden. 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 Patient is adamant that the left side is the most painful,  although wife states tha he has been complaining of right hip pain more frequently.    Chronic atrial fib:  he is not anticoagulated secondary to recurrent falls .  He has had home PT but per wife he does not do the exercises on his own.   GAD: he has been more irritable (demonstrated In office) due to progression of his aphasia..  he admits to being frustrated.  He is taking sertralie 50 mg daily    Outpatient Medications Prior to Visit  Medication Sig Dispense Refill   acetaminophen  (TYLENOL ) 325 MG tablet Take 2 tablets (650 mg total) by mouth every 6 (six) hours as needed for mild pain (or Fever >/= 101).     aspirin  EC 81 MG tablet Take 81 mg by mouth daily. Swallow whole.     cholecalciferol  (VITAMIN D3) 25 MCG (1000 UNIT) tablet Take 1 tablet (1,000 Units total) by mouth daily. 30 tablet 0   Cyanocobalamin  (B-12 PO) Take by mouth daily in the afternoon.     donepezil  (ARICEPT ) 10 MG tablet TAKE 1 TABLET(10 MG) BY MOUTH DAILY 90 tablet 1    l-methylfolate-B6-B12 (METANX) 3-35-2 MG TABS tablet Take 1 tablet by mouth daily. 30 tablet 0   Lactulose  20 GM/30ML SOLN 30 ml every 4 hours until constipation is relieved 437 mL 3   latanoprost  (XALATAN ) 0.005 % ophthalmic solution Place 1 drop into both eyes at bedtime.     magnesium  gluconate (MAGONATE) 500 MG tablet Take 0.5 tablets (250 mg total) by mouth at bedtime. 30 tablet 0   Multiple Vitamins-Minerals (PRESERVISION AREDS 2 PO) Take by mouth 2 (two) times daily.     omeprazole  (PRILOSEC) 40 MG capsule TAKE 1 CAPSULE BY MOUTH DAILY 90 capsule 3   rosuvastatin  (CRESTOR ) 10 MG tablet TAKE 1 TABLET(10 MG) BY MOUTH DAILY 90 tablet 3   tamsulosin  (FLOMAX ) 0.4 MG CAPS capsule TAKE 2 CAPSULES(0.8 MG) BY MOUTH DAILY AFTER SUPPER 180 capsule 3   torsemide  (DEMADEX ) 20 MG tablet Take 1 tablet (20 mg total) by mouth every other day. 90 tablet 1   traMADol  (ULTRAM ) 50 MG tablet Take 1 tablet (50 mg total) by mouth every 6 (six) hours as needed for severe pain (pain score 7-10). 60 tablet 1   metoprolol  succinate (TOPROL -XL) 100 MG 24 hr tablet TAKE 1 TABLET(100 MG) BY MOUTH DAILY WITH OR IMMEDIATELY FOLLOWING A MEAL 90 tablet 0   sertraline  (ZOLOFT ) 50 MG tablet TAKE 1 TABLET(50 MG) BY MOUTH  DAILY 90 tablet 1   No facility-administered medications prior to visit.    Review of Systems;  Patient denies headache, fevers, malaise, unintentional weight loss, skin rash, eye pain, sinus congestion and sinus pain, sore throat, dysphagia,  hemoptysis , cough, dyspnea, wheezing, chest pain, palpitations, orthopnea, edema, abdominal pain, nausea, melena, diarrhea, constipation, flank pain, dysuria, hematuria, urinary  Frequency, nocturia, numbness, tingling, seizures,  Focal weakness, Loss of consciousness,  Tremor, insomnia, depression, anxiety, and suicidal ideation.      Objective:  BP 122/70   Pulse 95   Ht 5' 11 (1.803 m)   Wt 182 lb 6.4 oz (82.7 kg)   SpO2 99%   BMI 25.44 kg/m   BP Readings  from Last 3 Encounters:  01/19/24 122/70  01/05/24 130/70  07/02/23 120/88    Wt Readings from Last 3 Encounters:  01/19/24 182 lb 6.4 oz (82.7 kg)  01/05/24 181 lb 12.8 oz (82.5 kg)  07/31/23 183 lb (83 kg)    Physical Exam Vitals reviewed.  Constitutional:      General: He is not in acute distress.    Appearance: Normal appearance. He is normal weight. He is not ill-appearing, toxic-appearing or diaphoretic.  HENT:     Head: Normocephalic.  Eyes:     General: No scleral icterus.       Right eye: No discharge.        Left eye: No discharge.     Conjunctiva/sclera: Conjunctivae normal.  Cardiovascular:     Rate and Rhythm: Normal rate and regular rhythm.     Heart sounds: Normal heart sounds.  Pulmonary:     Effort: Pulmonary effort is normal. No respiratory distress.     Breath sounds: Normal breath sounds.  Musculoskeletal:        General: No tenderness or signs of injury. Normal range of motion.     Cervical back: Normal range of motion.  Skin:    General: Skin is warm and dry.  Neurological:     General: No focal deficit present.     Mental Status: He is alert and oriented to person, place, and time. Mental status is at baseline.     Cranial Nerves: Dysarthria present.     Motor: Weakness present.     Coordination: Coordination abnormal.     Gait: Gait abnormal.     Comments: Ataxia   Psychiatric:        Mood and Affect: Mood normal.        Behavior: Behavior normal.        Thought Content: Thought content normal.        Judgment: Judgment normal.     Lab Results  Component Value Date   HGBA1C 6.2 01/01/2023   HGBA1C 5.6 03/16/2022   HGBA1C 6.2 11/02/2021    Lab Results  Component Value Date   CREATININE 1.22 07/02/2023   CREATININE 1.26 01/01/2023   CREATININE 1.59 (H) 05/22/2022    Lab Results  Component Value Date   WBC 7.9 01/01/2023   HGB 12.8 (L) 01/01/2023   HCT 39.1 01/01/2023   PLT 225.0 01/01/2023   GLUCOSE 103 (H) 07/02/2023    CHOL 132 07/02/2023   TRIG 115.0 07/02/2023   HDL 37.80 (L) 07/02/2023   LDLDIRECT 78.0 07/02/2023   LDLCALC 71 07/02/2023   ALT 17 07/02/2023   AST 19 07/02/2023   NA 137 07/02/2023   K 4.1 07/02/2023   CL 100 07/02/2023   CREATININE 1.22 07/02/2023   BUN 22  07/02/2023   CO2 30 07/02/2023   TSH 1.42 01/01/2023   PSA 3.32 11/02/2021   INR 1.1 03/15/2022   HGBA1C 6.2 01/01/2023    DG Ribs Unilateral W/Chest Right Result Date: 06/26/2023 CLINICAL DATA:  Right rib pain post fall EXAM: RIGHT RIBS AND CHEST - 3+ VIEW COMPARISON:  None Available. FINDINGS: old fracture deformities of the right tenth rib. Possible fracture deformity involving the anterior eighth and seventh right ribs. IMPRESSION: Rib fractures as described.  No pneumothorax Electronically Signed   By: Franky Chard M.D.   On: 06/26/2023 08:43    Assessment & Plan:  .Essential hypertension, benign -     Comprehensive metabolic panel with GFR -     Microalbumin / creatinine urine ratio  Dyslipidemia -     Lipid panel -     LDL cholesterol, direct  Prediabetes -     Comprehensive metabolic panel with GFR -     Hemoglobin A1c  Other fatigue -     CBC with Differential/Platelet -     TSH  Posterior pain of right hip Assessment & Plan: Adding meloxicam.  Plain films of hip and pelvis ordered to rule out SI fractures.   Orders: -     DG HIPS BILAT W OR W/O PELVIS 3-4 VIEWS; Future  Posterior pain of left hip Assessment & Plan: Adding meloxicam.  Plain films of hip and pelvis ordered to rule out SI fractures.   Orders: -     DG HIPS BILAT W OR W/O PELVIS 3-4 VIEWS; Future  Right leg weakness Assessment & Plan: Permanent secondary to left MCA territory embolic CVA.  He is able to rise from a chair with some assistance, and ambulates  with a rolling walker but continues to have falls . His last home PT session was completed in August after June 2025 fall resulted in broken ribs.     Balance  problem Assessment & Plan: Multifactorial , secondary to prior CVA and right sided hemiplegia.   He denies feeling dizzy with sudden position changes and is taking torsemide  10 mg every other day.     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HCC) Assessment & Plan: I have increased his sertraline  dose to 100 mg daily to address the increased irritability his wife reports due to mounting frustration due to his aphasia    Pain of right hip joint Assessment & Plan: Adding meloxicam.  Plain films of hip and pelvis ordered to rule out SI fractures.    Other orders -     Metoprolol  Succinate ER; Take with or immediately following a meal.TAKE 1 TABLET(100 MG) BY MOUTH DAILY WITH OR IMMEDIATELY FOLLOWING A MEAL  Dispense: 90 tablet; Refill: 1 -     Sertraline  HCl; Take 1 tablet (100 mg total) by mouth daily.  Dispense: 90 tablet; Refill: 1 -     Meloxicam; Take 1 tablet (7.5 mg total) by mouth daily.  Dispense: 30 tablet; Refill: 0     I spent 34 minutes on the day of this face to face encounter reviewing patient's  most recent visit with cardiology,  nephrology,  and neurology,  prior relevant surgical and non surgical procedures, recent  labs and imaging studies, counseling on weight management,  reviewing the assessment and plan with patient, and post visit ordering and reviewing of  diagnostics and therapeutics with patient  .   Follow-up: Return in about 6 months (around 07/19/2024).   Verneita LITTIE Kettering, MD "

## 2024-01-19 NOTE — Assessment & Plan Note (Signed)
 Multifactorial , secondary to prior CVA and right sided hemiplegia.   He denies feeling dizzy with sudden position changes and is taking torsemide  10 mg every other day.

## 2024-01-20 ENCOUNTER — Ambulatory Visit: Payer: Self-pay | Admitting: Internal Medicine

## 2024-01-20 DIAGNOSIS — I10 Essential (primary) hypertension: Secondary | ICD-10-CM

## 2024-01-20 NOTE — Assessment & Plan Note (Signed)
 Currently managed with  metoprolol  due to concurrent atrail fibrillation;  however he has microalbuminuria and uacr >70 with Stage 3a CKD.  will consider adding low dose of ARB if needed for control og BP but currently the risk of hypotension and falls is greater   Lab Results  Component Value Date   CREATININE 1.11 01/19/2024   Lab Results  Component Value Date   MICROALBUR 3.5 (H) 01/19/2024     Lab Results  Component Value Date   NA 140 01/19/2024   K 4.4 01/19/2024   CL 100 01/19/2024   CO2 31 01/19/2024

## 2024-01-20 NOTE — Assessment & Plan Note (Addendum)
 Currently managed with  metoprolol  due to concurrent atrail fibrillation;  however he has microalbuminuria and uacr >70 with Stage 3a CKD.  will consider adding low dose of ARB if needed for control og BP but currently the risk of hypotension and falls is greater   Lab Results  Component Value Date   CREATININE 1.11 01/19/2024   Lab Results  Component Value Date   MICROALBUR 3.5 (H) 01/19/2024     Lab Results  Component Value Date   NA 140 01/19/2024   K 4.4 01/19/2024   CL 100 01/19/2024   CO2 31 01/19/2024

## 2024-02-02 ENCOUNTER — Ambulatory Visit: Admitting: Podiatry

## 2024-02-02 ENCOUNTER — Encounter: Payer: Self-pay | Admitting: Podiatry

## 2024-02-02 DIAGNOSIS — D689 Coagulation defect, unspecified: Secondary | ICD-10-CM | POA: Diagnosis not present

## 2024-02-02 DIAGNOSIS — M79675 Pain in left toe(s): Secondary | ICD-10-CM

## 2024-02-02 DIAGNOSIS — L84 Corns and callosities: Secondary | ICD-10-CM

## 2024-02-02 DIAGNOSIS — B351 Tinea unguium: Secondary | ICD-10-CM | POA: Diagnosis not present

## 2024-02-02 DIAGNOSIS — M79674 Pain in right toe(s): Secondary | ICD-10-CM

## 2024-02-07 NOTE — Progress Notes (Unsigned)
"  °  Subjective:  Patient ID: Todd LELON Donnell Mickey., male    DOB: [birth year],  MRN: [id number]  Todd LELON Donnell Mickey. presents to clinic today for {jgcomplaint:23593}  Chief Complaint  Patient presents with   Nail Problem    He saw Dr. Marylynn in Dec. He denies being diabetic.   New problem(s): None.   PCP is [name], MD.  Allergies[1]  Review of Systems: Negative except as noted in the HPI.  Objective: No changes noted in today's physical examination. There were no vitals filed for this visit. Todd LELON Donnell Mickey. is a pleasant 89 y.o. male in NAD. AAO x 3.  Vascular Examination: Capillary refill time immediate b/l. Palpable pedal pulses. Pedal hair absent b/l. No pain with calf compression b/l. Skin temperature gradient WNL b/l. No cyanosis or clubbing b/l. No ischemia or gangrene noted b/l. No edema noted b/l LE.  Neurological Examination: Sensation grossly intact b/l with 10 gram monofilament. Vibratory sensation intact b/l.   Dermatological Examination: Pedal skin with normal turgor, texture and tone b/l.  No open wounds. No interdigital macerations.   Toenails 1-5 b/l thick, discolored, elongated with subungual debris and pain on dorsal palpation.   Preulcerative lesion noted distal tip of right 3rd toe and dorsal PIPJ of L 4th toe. There is visible subdermal hemorrhage. There is no surrounding erythema, no edema, no drainage, no odor, no fluctuance.  Musculoskeletal Examination: Muscle strength 5/5 to all lower extremity muscle groups bilaterally. No pain, crepitus or joint limitation noted with ROM b/l LE. Hammertoe(s) L 4th toe. Clawtoe deformity right third digit.. Patient ambulates with rollator assistance.  Radiographs: None  Assessment/Plan: 1. Pain due to onychomycosis of toenails of both feet   2. Pre-ulcerative corn or callous   3. Clotting disorder   {Jgplan:23602::-Patient/POA to call should there be question/concern in the interim.}   No follow-ups on  file.  Delon CROME Merlin, DPM      [place] [place] LOCATION: [address] [place] [address]                   Office [phone]   [facility] LOCATION: [address] [place], [place] [address] Office [phone]     [1] No Known Allergies  "

## 2024-02-23 ENCOUNTER — Other Ambulatory Visit: Payer: Self-pay | Admitting: Internal Medicine

## 2024-04-15 ENCOUNTER — Ambulatory Visit: Admitting: Podiatry

## 2024-05-05 ENCOUNTER — Ambulatory Visit

## 2024-07-19 ENCOUNTER — Ambulatory Visit: Admitting: Internal Medicine
